# Patient Record
Sex: Female | Born: 1940
Health system: Southern US, Community
[De-identification: ages and names within clinical notes are randomized; demographics above are authoritative.]

## PROBLEM LIST (undated history)

## (undated) DIAGNOSIS — T8149XA Infection following a procedure, other surgical site, initial encounter: Secondary | ICD-10-CM

## (undated) DIAGNOSIS — I10 Essential (primary) hypertension: Secondary | ICD-10-CM

## (undated) DIAGNOSIS — I2510 Atherosclerotic heart disease of native coronary artery without angina pectoris: Secondary | ICD-10-CM

## (undated) DIAGNOSIS — I639 Cerebral infarction, unspecified: Secondary | ICD-10-CM

## (undated) DIAGNOSIS — I2119 ST elevation (STEMI) myocardial infarction involving other coronary artery of inferior wall: Secondary | ICD-10-CM

## (undated) HISTORY — DX: Cerebral infarction, unspecified: I63.9

## (undated) HISTORY — DX: Essential (primary) hypertension: I10

## (undated) HISTORY — PX: BACK SURGERY: SHX140

## (undated) HISTORY — DX: Infection following a procedure, other surgical site, initial encounter: T81.49XA

## (undated) HISTORY — PX: APPENDECTOMY: SHX54

## (undated) HISTORY — PX: ABDOMINAL HYSTERECTOMY: SHX81

## (undated) HISTORY — DX: ST elevation (STEMI) myocardial infarction involving other coronary artery of inferior wall: I21.19

## (undated) HISTORY — DX: Atherosclerotic heart disease of native coronary artery without angina pectoris: I25.10

---

## 1999-05-28 DIAGNOSIS — R609 Edema, unspecified: Secondary | ICD-10-CM | POA: Insufficient documentation

## 1999-06-02 DIAGNOSIS — B029 Zoster without complications: Secondary | ICD-10-CM | POA: Insufficient documentation

## 2001-11-17 DIAGNOSIS — J309 Allergic rhinitis, unspecified: Secondary | ICD-10-CM | POA: Insufficient documentation

## 2003-05-21 DIAGNOSIS — I872 Venous insufficiency (chronic) (peripheral): Secondary | ICD-10-CM | POA: Insufficient documentation

## 2004-09-16 DIAGNOSIS — K5900 Constipation, unspecified: Secondary | ICD-10-CM | POA: Insufficient documentation

## 2006-03-30 DIAGNOSIS — N951 Menopausal and female climacteric states: Secondary | ICD-10-CM | POA: Insufficient documentation

## 2007-03-06 DIAGNOSIS — R7301 Impaired fasting glucose: Secondary | ICD-10-CM | POA: Insufficient documentation

## 2007-11-29 DIAGNOSIS — N259 Disorder resulting from impaired renal tubular function, unspecified: Secondary | ICD-10-CM | POA: Insufficient documentation

## 2007-11-29 DIAGNOSIS — E785 Hyperlipidemia, unspecified: Secondary | ICD-10-CM | POA: Insufficient documentation

## 2008-11-21 DIAGNOSIS — R079 Chest pain, unspecified: Secondary | ICD-10-CM | POA: Insufficient documentation

## 2008-11-21 DIAGNOSIS — I440 Atrioventricular block, first degree: Secondary | ICD-10-CM | POA: Insufficient documentation

## 2008-11-21 DIAGNOSIS — K219 Gastro-esophageal reflux disease without esophagitis: Secondary | ICD-10-CM | POA: Insufficient documentation

## 2011-11-22 ENCOUNTER — Other Ambulatory Visit (HOSPITAL_COMMUNITY): Payer: Self-pay | Admitting: Family Medicine

## 2011-11-22 DIAGNOSIS — Z139 Encounter for screening, unspecified: Secondary | ICD-10-CM

## 2011-12-16 ENCOUNTER — Other Ambulatory Visit (HOSPITAL_COMMUNITY): Payer: Self-pay

## 2011-12-16 ENCOUNTER — Ambulatory Visit (HOSPITAL_COMMUNITY): Payer: Self-pay

## 2011-12-21 ENCOUNTER — Ambulatory Visit (HOSPITAL_COMMUNITY)
Admission: RE | Admit: 2011-12-21 | Discharge: 2011-12-21 | Disposition: A | Discharge status: Home / Self Care | Payer: Medicare PPO | Source: Ambulatory Visit | Attending: Family Medicine | Admitting: Family Medicine

## 2011-12-21 DIAGNOSIS — Z139 Encounter for screening, unspecified: Secondary | ICD-10-CM

## 2011-12-21 DIAGNOSIS — Z1231 Encounter for screening mammogram for malignant neoplasm of breast: Secondary | ICD-10-CM | POA: Insufficient documentation

## 2011-12-21 DIAGNOSIS — M899 Disorder of bone, unspecified: Secondary | ICD-10-CM | POA: Insufficient documentation

## 2012-05-22 ENCOUNTER — Other Ambulatory Visit (HOSPITAL_COMMUNITY): Payer: Self-pay | Admitting: Family Medicine

## 2012-05-22 DIAGNOSIS — R1310 Dysphagia, unspecified: Secondary | ICD-10-CM

## 2012-05-23 ENCOUNTER — Other Ambulatory Visit (HOSPITAL_COMMUNITY): Payer: Medicare PPO

## 2013-02-07 ENCOUNTER — Encounter (INDEPENDENT_AMBULATORY_CARE_PROVIDER_SITE_OTHER): Payer: Self-pay | Admitting: *Deleted

## 2013-02-14 ENCOUNTER — Other Ambulatory Visit (INDEPENDENT_AMBULATORY_CARE_PROVIDER_SITE_OTHER): Payer: Self-pay | Admitting: *Deleted

## 2013-02-14 ENCOUNTER — Telehealth (INDEPENDENT_AMBULATORY_CARE_PROVIDER_SITE_OTHER): Payer: Self-pay | Admitting: *Deleted

## 2013-02-14 ENCOUNTER — Encounter (INDEPENDENT_AMBULATORY_CARE_PROVIDER_SITE_OTHER): Payer: Self-pay | Admitting: *Deleted

## 2013-02-14 DIAGNOSIS — Z1211 Encounter for screening for malignant neoplasm of colon: Secondary | ICD-10-CM

## 2013-02-14 MED ORDER — PEG-KCL-NACL-NASULF-NA ASC-C 100 G PO SOLR: 1.0000 | Freq: Once | ORAL | Status: DC

## 2013-02-14 NOTE — Telephone Encounter (Signed)
Patient needs movi prep 

## 2013-02-16 ENCOUNTER — Other Ambulatory Visit (HOSPITAL_COMMUNITY): Payer: Self-pay | Admitting: Family Medicine

## 2013-02-16 DIAGNOSIS — Z139 Encounter for screening, unspecified: Secondary | ICD-10-CM

## 2013-02-19 ENCOUNTER — Ambulatory Visit (HOSPITAL_COMMUNITY)
Admission: RE | Admit: 2013-02-19 | Discharge: 2013-02-19 | Disposition: A | Discharge status: Home / Self Care | Payer: Medicare PPO | Source: Ambulatory Visit | Attending: Family Medicine | Admitting: Family Medicine

## 2013-02-19 DIAGNOSIS — Z139 Encounter for screening, unspecified: Secondary | ICD-10-CM

## 2013-02-19 DIAGNOSIS — Z1231 Encounter for screening mammogram for malignant neoplasm of breast: Secondary | ICD-10-CM | POA: Insufficient documentation

## 2013-03-08 ENCOUNTER — Telehealth (INDEPENDENT_AMBULATORY_CARE_PROVIDER_SITE_OTHER): Payer: Self-pay | Admitting: *Deleted

## 2013-03-08 NOTE — Telephone Encounter (Signed)
  Procedure: tcs  Reason/Indication:  screening  Has patient had this procedure before?  Yes, more than 10 yrs ago  If so, when, by whom and where?    Is there a family history of colon cancer?  no  Who?  What age when diagnosed?    Is patient diabetic?   no      Does patient have prosthetic heart valve?  no  Do you have a pacemaker?  no  Has patient ever had endocarditis? no  Has patient had joint replacement within last 12 months?  no  Is patient on Coumadin, Plavix and/or Aspirin? no  Medications: triamterene 75 mg 1/2 tab daily, amlodipine 10 mg daily, alendronate 70 mg weekly,   Allergies: nkda  Medication Adjustment:   Procedure date & time: 03/29/13 at 930

## 2013-03-08 NOTE — Telephone Encounter (Signed)
agree

## 2013-03-20 ENCOUNTER — Encounter (INDEPENDENT_AMBULATORY_CARE_PROVIDER_SITE_OTHER): Payer: Self-pay | Admitting: *Deleted

## 2013-03-29 ENCOUNTER — Ambulatory Visit (HOSPITAL_COMMUNITY)
Admission: RE | Admit: 2013-03-29 | Discharge: 2013-03-29 | Disposition: A | Discharge status: Home / Self Care | Payer: BC Managed Care – PPO | Source: Ambulatory Visit | Attending: Internal Medicine | Admitting: Internal Medicine

## 2013-03-29 ENCOUNTER — Encounter (HOSPITAL_COMMUNITY)
Admission: RE | Disposition: A | Discharge status: Home / Self Care | Payer: Self-pay | Source: Ambulatory Visit | Attending: Internal Medicine

## 2013-03-29 ENCOUNTER — Encounter (HOSPITAL_COMMUNITY): Payer: Self-pay | Admitting: *Deleted

## 2013-03-29 DIAGNOSIS — D126 Benign neoplasm of colon, unspecified: Secondary | ICD-10-CM

## 2013-03-29 DIAGNOSIS — Z1211 Encounter for screening for malignant neoplasm of colon: Secondary | ICD-10-CM | POA: Insufficient documentation

## 2013-03-29 DIAGNOSIS — K644 Residual hemorrhoidal skin tags: Secondary | ICD-10-CM

## 2013-03-29 DIAGNOSIS — I10 Essential (primary) hypertension: Secondary | ICD-10-CM | POA: Diagnosis not present

## 2013-03-29 DIAGNOSIS — K5730 Diverticulosis of large intestine without perforation or abscess without bleeding: Secondary | ICD-10-CM | POA: Insufficient documentation

## 2013-03-29 HISTORY — PX: COLONOSCOPY: SHX5424

## 2013-03-29 SURGERY — COLONOSCOPY
Anesthesia: Moderate Sedation

## 2013-03-29 MED ORDER — MEPERIDINE HCL 50 MG/ML IJ SOLN
INTRAMUSCULAR | Status: DC | PRN
  Administered 2013-03-29 (×2): 25 mg via INTRAVENOUS

## 2013-03-29 MED ORDER — MEPERIDINE HCL 50 MG/ML IJ SOLN
INTRAMUSCULAR | Status: AC
  Filled 2013-03-29: qty 1

## 2013-03-29 MED ORDER — MIDAZOLAM HCL 5 MG/5ML IJ SOLN
INTRAMUSCULAR | Status: AC
  Filled 2013-03-29: qty 10

## 2013-03-29 MED ORDER — SODIUM CHLORIDE 0.9 % IV SOLN
INTRAVENOUS | Status: DC
  Administered 2013-03-29: 09:00:00 via INTRAVENOUS

## 2013-03-29 MED ORDER — MIDAZOLAM HCL 5 MG/5ML IJ SOLN
INTRAMUSCULAR | Status: DC | PRN
  Administered 2013-03-29: 2 mg via INTRAVENOUS
  Administered 2013-03-29 (×2): 1 mg via INTRAVENOUS

## 2013-03-29 MED ORDER — STERILE WATER FOR IRRIGATION IR SOLN
Status: DC | PRN
  Administered 2013-03-29: 09:00:00

## 2013-03-29 NOTE — H&P (Signed)
[name] [name] is an 72 y.o. female.   Chief Complaint: Patient is here for colonoscopy. HPI: Patient is 72 year old African female who is in for screening colonoscopy. Her last exam was over 10 years ago. She denies abdominal pain change in bowel habits rectal bleeding. She has occasional hematochezia when she is constipated. Family history is negative for CRC.  Past Medical History  Diagnosis Date  . Hypertension     Past Surgical History  Procedure Laterality Date  . Back surgery    . Abdominal hysterectomy    . Appendectomy      Family History  Problem Relation Age of Onset  . Colon cancer Neg Hx    Social History:  reports that she has never smoked. She does not have any smokeless tobacco history on file. She reports that she does not drink alcohol or use illicit drugs.  Allergies:  Allergies  Allergen Reactions  . Other Hives and Itching    Food Dye    Medications Prior to Admission  Medication Sig Dispense Refill  . alendronate (FOSAMAX) 70 MG tablet Take 70 mg by mouth every 7 (seven) days. Take with a full glass of water on an empty stomach.      . Calcium-Magnesium-Vitamin D (CALCIUM 500 PO) Take 1 tablet by mouth daily.      . peg 3350 powder (MOVIPREP) 100 G SOLR Take 1 kit (100 g total) by mouth once.  1 kit  0  . triamterene-hydrochlorothiazide (MAXZIDE-25) 37.5-25 MG per tablet Take 0.5 tablets by mouth daily.        No results found for this or any previous visit (from the past 48 hour(s)). No results found.  ROS  Blood pressure 127/80, pulse 74, temperature 97.7 F (36.5 C), temperature source Oral, resp. rate 15, height 5' 10.25" (1.784 m), weight 172 lb (78.019 kg), SpO2 100.00%. Physical Exam  Constitutional: She appears well-developed and well-nourished.  HENT:  Mouth/Throat: Oropharynx is clear and moist.  Eyes: Conjunctivae are normal. No scleral icterus.  Neck: No thyromegaly present.  Cardiovascular: Normal rate, regular rhythm and normal  heart sounds.   No murmur heard. Respiratory: Effort normal and breath sounds normal.  GI: Soft. She exhibits no distension and no mass. There is no tenderness.  Musculoskeletal: She exhibits no edema.  Lymphadenopathy:    She has no cervical adenopathy.  Neurological: She is alert.  Skin: Skin is warm and dry.     Assessment/Plan Average risk screening colonoscopy.  REHMAN,NAJEEB U 03/29/2013, 9:16 AM

## 2013-03-29 NOTE — Op Note (Addendum)
COLONOSCOPY PROCEDURE REPORT  PATIENT:  [name] [name]  MR#:  [id number] Birthdate:  [birth year], 72 y.o., female Endoscopist:  Dr. Malissa Hippo, MD Referred By:  Dr. Ishmael Holter. Renard Matter, MD Procedure Date: 03/29/2013  Procedure:   Colonoscopy  Indications: Patient is 72 year old African female who is undergoing average risk screening colonoscopy. Her last exam was over 10 years ago.  Informed Consent:  The procedure and risks were reviewed with the patient and informed consent was obtained.  Medications:  Demerol 50 mg IV Versed 4 mg IV  Description of procedure:  After a digital rectal exam was performed, that colonoscope was advanced from the anus through the rectum and colon to the area of the cecum, ileocecal valve and appendiceal orifice. The cecum was deeply intubated. These structures were well-seen and photographed for the record. From the level of the cecum and ileocecal valve, the scope was slowly and cautiously withdrawn. The mucosal surfaces were carefully surveyed utilizing scope tip to flexion to facilitate fold flattening as needed. The scope was pulled down into the rectum where a thorough exam including retroflexion was performed.  Findings:   Prep satisfactory. Small polyp ablated via cold biopsy from ascending colon. Polyp was located just above the ileocecal valve. Few small diverticula and sigmoid colon. Normal rectal mucosa. Small hemorrhoids below the dentate line.   Therapeutic/Diagnostic Maneuvers Performed:  See above  Complications:  None  Cecal Withdrawal Time:  15  minutes  Impression:  Examination performed to cecum. Small polyp ablated via cold biopsy from ascending colon. Few small diverticula and sigmoid colon. Small external hemorrhoids.  Recommendations:  Standard instructions given. I will contact patient with biopsy results and further recommendations.  [name]  03/29/2013 9:51 AM  CC: Dr. Alice Reichert, MD & Dr. Bonnetta Barry ref. provider  found

## 2013-04-03 ENCOUNTER — Encounter (HOSPITAL_COMMUNITY): Payer: Self-pay | Admitting: Internal Medicine

## 2013-04-24 ENCOUNTER — Encounter (INDEPENDENT_AMBULATORY_CARE_PROVIDER_SITE_OTHER): Payer: Self-pay | Admitting: *Deleted

## 2013-06-04 ENCOUNTER — Ambulatory Visit (HOSPITAL_COMMUNITY)
Admission: RE | Admit: 2013-06-04 | Discharge: 2013-06-04 | Disposition: A | Discharge status: Home / Self Care | Payer: BC Managed Care – PPO | Source: Ambulatory Visit | Attending: Family Medicine | Admitting: Family Medicine

## 2013-06-04 ENCOUNTER — Other Ambulatory Visit (HOSPITAL_COMMUNITY): Payer: Self-pay | Admitting: Family Medicine

## 2013-06-04 DIAGNOSIS — R52 Pain, unspecified: Secondary | ICD-10-CM

## 2013-06-04 DIAGNOSIS — X58XXXA Exposure to other specified factors, initial encounter: Secondary | ICD-10-CM | POA: Insufficient documentation

## 2013-06-04 DIAGNOSIS — S8990XA Unspecified injury of unspecified lower leg, initial encounter: Secondary | ICD-10-CM | POA: Insufficient documentation

## 2014-01-08 DIAGNOSIS — M25562 Pain in left knee: Secondary | ICD-10-CM | POA: Insufficient documentation

## 2014-10-01 ENCOUNTER — Other Ambulatory Visit (HOSPITAL_COMMUNITY): Payer: Self-pay | Admitting: Family Medicine

## 2014-10-01 ENCOUNTER — Ambulatory Visit (HOSPITAL_COMMUNITY)
Admission: RE | Admit: 2014-10-01 | Discharge: 2014-10-01 | Disposition: A | Discharge status: Home / Self Care | Payer: BC Managed Care – PPO | Source: Ambulatory Visit | Attending: Family Medicine | Admitting: Family Medicine

## 2014-10-01 DIAGNOSIS — M5030 Other cervical disc degeneration, unspecified cervical region: Secondary | ICD-10-CM

## 2014-10-01 DIAGNOSIS — M25551 Pain in right hip: Secondary | ICD-10-CM | POA: Insufficient documentation

## 2014-10-01 DIAGNOSIS — M25552 Pain in left hip: Secondary | ICD-10-CM | POA: Insufficient documentation

## 2014-10-01 DIAGNOSIS — M545 Low back pain: Secondary | ICD-10-CM | POA: Diagnosis present

## 2014-10-01 DIAGNOSIS — M5136 Other intervertebral disc degeneration, lumbar region: Secondary | ICD-10-CM | POA: Insufficient documentation

## 2014-10-22 ENCOUNTER — Other Ambulatory Visit (HOSPITAL_COMMUNITY): Payer: Self-pay | Admitting: Family Medicine

## 2014-10-22 DIAGNOSIS — Z1231 Encounter for screening mammogram for malignant neoplasm of breast: Secondary | ICD-10-CM

## 2014-10-28 ENCOUNTER — Ambulatory Visit (HOSPITAL_COMMUNITY)
Admission: RE | Admit: 2014-10-28 | Discharge: 2014-10-28 | Disposition: A | Discharge status: Home / Self Care | Payer: BC Managed Care – PPO | Source: Ambulatory Visit | Attending: Family Medicine | Admitting: Family Medicine

## 2014-10-28 DIAGNOSIS — Z1231 Encounter for screening mammogram for malignant neoplasm of breast: Secondary | ICD-10-CM | POA: Diagnosis not present

## 2015-02-14 ENCOUNTER — Encounter (INDEPENDENT_AMBULATORY_CARE_PROVIDER_SITE_OTHER): Payer: Self-pay | Admitting: *Deleted

## 2015-02-19 ENCOUNTER — Encounter (INDEPENDENT_AMBULATORY_CARE_PROVIDER_SITE_OTHER): Payer: Self-pay | Admitting: *Deleted

## 2015-02-19 ENCOUNTER — Encounter (INDEPENDENT_AMBULATORY_CARE_PROVIDER_SITE_OTHER): Payer: Self-pay | Admitting: Internal Medicine

## 2015-02-19 ENCOUNTER — Ambulatory Visit (INDEPENDENT_AMBULATORY_CARE_PROVIDER_SITE_OTHER): Payer: Medicare PPO | Admitting: Internal Medicine

## 2015-02-19 VITALS — BP 188/90 | HR 76 | Temp 98.0°F | Ht 70.0 in | Wt 173.5 lb

## 2015-02-19 DIAGNOSIS — K5909 Other constipation: Secondary | ICD-10-CM | POA: Diagnosis not present

## 2015-02-19 DIAGNOSIS — R1314 Dysphagia, pharyngoesophageal phase: Secondary | ICD-10-CM

## 2015-02-19 DIAGNOSIS — I10 Essential (primary) hypertension: Secondary | ICD-10-CM | POA: Insufficient documentation

## 2015-02-19 DIAGNOSIS — R143 Flatulence: Secondary | ICD-10-CM | POA: Diagnosis not present

## 2015-02-19 NOTE — Progress Notes (Signed)
   Subjective:    Patient ID: [name] [name], female    DOB: 1940-11-12, 73 y.o.   MRN: [id number]  HPI Presents today with c/o of excessive gas. She says she has gas all the time. She carries Gas X with her.    She says sometimes when she swallows she feels a lump. There is no dysphagia. She has trouble with swallowing liquids at times.  She is having a BM usually once a week. Sometimes she will take Phillip's MOM. Ducolax as needed. She has tried Linzess. She says the Linzess helped but then stopped working.  She has ball like stool. Sometimes stools are loose. She denies any melena or BRRB. Constipation has been going on for a couple of years. Appetite is good. Eight pound weight loss intentional. . No abdominal pain  Occasionally has heart burn. Last colonoscopy was in 2014  (see below)  03/29/2013 Colonoscopy: Average risk: Dr. Laural Golden:   Findings:  Prep satisfactory. Small polyp ablated via cold biopsy from ascending colon. Polyp was located just above the ileocecal valve. Few small diverticula and sigmoid colon. Normal rectal mucosa. Small hemorrhoids below the dentate line. Biopsy: sessile serrated polyp, adenoma. Next colonoscopy in 26 ys. Review of Systems Divorced, One child with high blood pressure. Retired Hospital doctor.  Past Medical History  Diagnosis Date  . Hypertension     Past Surgical History  Procedure Laterality Date  . Back surgery    . Abdominal hysterectomy    . Appendectomy    . Colonoscopy N/A 03/29/2013    Procedure: COLONOSCOPY;  Surgeon: Rogene Houston, MD;  Location: [facility];  Service: Endoscopy;  Laterality: N/A;  930    Allergies  Allergen Reactions  . Other Hives and Itching    Food Dye    No current outpatient prescriptions on file prior to visit.   No current facility-administered medications on file prior to visit.      Objective:   Physical Exam Blood pressure 188/90, pulse 76, temperature 98 F (36.7 C), height 5\' 10"   (1.778 m), weight 173 lb 8 oz (78.699 kg).  Alert and oriented. Skin warm and dry. Oral mucosa is moist.   . Sclera anicteric, conjunctivae is pink. Thyroid not enlarged. No cervical lymphadenopathy. Lungs clear. Heart regular rate and rhythm.  Abdomen is soft. Bowel sounds are positive. No hepatomegaly. No abdominal masses felt. No tenderness.  No edema to lower extremities.       Assessment & Plan:  Flatus, constipation. Will try her on Amitza and see if this helps.  Dysphagia to liquids: Am going to get an Esophagram to rule out a motility problem.  OV in 3 months

## 2015-02-19 NOTE — Patient Instructions (Signed)
Esophagram  Samples of Amitiza given to patient

## 2015-02-26 ENCOUNTER — Ambulatory Visit (HOSPITAL_COMMUNITY)
Admission: RE | Admit: 2015-02-26 | Discharge: 2015-02-26 | Disposition: A | Discharge status: Home / Self Care | Payer: Medicare PPO | Source: Ambulatory Visit | Attending: Internal Medicine | Admitting: Internal Medicine

## 2015-02-26 DIAGNOSIS — R1314 Dysphagia, pharyngoesophageal phase: Secondary | ICD-10-CM | POA: Insufficient documentation

## 2015-02-26 DIAGNOSIS — I10 Essential (primary) hypertension: Secondary | ICD-10-CM | POA: Diagnosis not present

## 2015-02-26 DIAGNOSIS — K449 Diaphragmatic hernia without obstruction or gangrene: Secondary | ICD-10-CM | POA: Insufficient documentation

## 2015-02-28 ENCOUNTER — Telehealth (INDEPENDENT_AMBULATORY_CARE_PROVIDER_SITE_OTHER): Payer: Self-pay | Admitting: Internal Medicine

## 2015-02-28 DIAGNOSIS — K5909 Other constipation: Secondary | ICD-10-CM

## 2015-02-28 MED ORDER — LUBIPROSTONE 24 MCG PO CAPS: 24.0000 ug | ORAL_CAPSULE | Freq: Every day | ORAL | Status: DC

## 2015-02-28 NOTE — Telephone Encounter (Signed)
Rx for Amitiza 38mcg sent to her pharmacy.

## 2015-04-14 ENCOUNTER — Encounter (INDEPENDENT_AMBULATORY_CARE_PROVIDER_SITE_OTHER): Payer: Self-pay | Admitting: *Deleted

## 2015-05-26 ENCOUNTER — Ambulatory Visit (INDEPENDENT_AMBULATORY_CARE_PROVIDER_SITE_OTHER): Payer: Medicare PPO | Admitting: Internal Medicine

## 2015-05-26 ENCOUNTER — Encounter (INDEPENDENT_AMBULATORY_CARE_PROVIDER_SITE_OTHER): Payer: Self-pay | Admitting: Internal Medicine

## 2015-05-26 VITALS — BP 128/90 | HR 68 | Temp 98.0°F | Ht 70.0 in | Wt 175.3 lb

## 2015-05-26 DIAGNOSIS — K5909 Other constipation: Secondary | ICD-10-CM

## 2015-05-26 MED ORDER — LUBIPROSTONE 24 MCG PO CAPS: 24.0000 ug | ORAL_CAPSULE | Freq: Two times a day (BID) | ORAL | Status: DC

## 2015-05-26 NOTE — Progress Notes (Signed)
   Subjective:    Patient ID: [name] [name], female    DOB: 07-07-1941, 74 y.o.   MRN: [id number]  HPIHere today for f/u. She was last seen in July for excessive flatus. She had flatus "all the time". She had being using Gas X.  She tells me she is having a BM twice a week.  The Amitiza is really not helping. She takes the Amitiza daily. Her stools are hard. No melena or BRRB. She also c/o gas. Symptoms for years.  Appetite is good. She has gained 2 pounds since her last visit. She has tried MOM and Linzess once a day but really did not help. Linzess gave her diarrhea and she stopped. Last colonoscopy was in 2014 (see below)  03/29/2013 Colonoscopy: Average risk: Dr. Laural Golden:   Findings:  Prep satisfactory. Small polyp ablated via cold biopsy from ascending colon. Polyp was located just above the ileocecal valve. Few small diverticula and sigmoid colon. Normal rectal mucosa. Small hemorrhoids below the dentate line. Biopsy: sessile serrated polyp, adenoma. Next colonoscopy in 20 ys. Review of Systems Divorced, One child with high blood pressure. Retired Hospital doctor.   Review of Systems Past Medical History  Diagnosis Date  . Hypertension     Past Surgical History  Procedure Laterality Date  . Back surgery    . Abdominal hysterectomy    . Appendectomy    . Colonoscopy N/A 03/29/2013    Procedure: COLONOSCOPY;  Surgeon: Rogene Houston, MD;  Location: [facility];  Service: Endoscopy;  Laterality: N/A;  930    Allergies  Allergen Reactions  . Other Hives and Itching    Food Dye    Current Outpatient Prescriptions on File Prior to Visit  Medication Sig Dispense Refill  . lubiprostone (AMITIZA) 24 MCG capsule Take 1 capsule (24 mcg total) by mouth daily. 30 capsule 3   No current facility-administered medications on file prior to visit.        Objective:   Physical Exam Blood pressure 128/90, pulse 68, temperature 98 F (36.7 C), height 5\' 10"  (1.778 m),  weight 175 lb 4.8 oz (79.516 kg).  Alert and oriented. Skin warm and dry. Oral mucosa is moist.   . Sclera anicteric, conjunctivae is pink. Thyroid not enlarged. No cervical lymphadenopathy. Lungs clear. Heart regular rate and rhythm.  Abdomen is soft. Bowel sounds are positive. No hepatomegaly. No abdominal masses felt. No tenderness.  No edema to lower extremities.         Assessment & Plan:  Constipation: Am going to increase Amitiza 79mcg to BID. Increase fiber  OV in 6 months.

## 2015-05-26 NOTE — Patient Instructions (Signed)
Amitiza 56mcg BID. OV in 6 months  Increase fiber in diet.

## 2015-08-10 ENCOUNTER — Emergency Department (HOSPITAL_COMMUNITY): Payer: Medicare PPO

## 2015-08-10 ENCOUNTER — Encounter (HOSPITAL_COMMUNITY): Payer: Self-pay | Admitting: *Deleted

## 2015-08-10 ENCOUNTER — Inpatient Hospital Stay (HOSPITAL_COMMUNITY)
Admission: EM | Admit: 2015-08-10 | Discharge: 2015-08-22 | DRG: 232 | Disposition: A | Discharge status: Home Health | Payer: Medicare PPO | Attending: Cardiothoracic Surgery | Admitting: Cardiothoracic Surgery

## 2015-08-10 ENCOUNTER — Encounter (HOSPITAL_COMMUNITY)
Admission: EM | Disposition: A | Discharge status: Home Health | Payer: Medicare PPO | Source: Home / Self Care | Attending: Cardiothoracic Surgery

## 2015-08-10 DIAGNOSIS — I2111 ST elevation (STEMI) myocardial infarction involving right coronary artery: Secondary | ICD-10-CM | POA: Diagnosis not present

## 2015-08-10 DIAGNOSIS — K219 Gastro-esophageal reflux disease without esophagitis: Secondary | ICD-10-CM | POA: Diagnosis present

## 2015-08-10 DIAGNOSIS — I10 Essential (primary) hypertension: Secondary | ICD-10-CM | POA: Diagnosis present

## 2015-08-10 DIAGNOSIS — I25110 Atherosclerotic heart disease of native coronary artery with unstable angina pectoris: Secondary | ICD-10-CM | POA: Diagnosis not present

## 2015-08-10 DIAGNOSIS — J9811 Atelectasis: Secondary | ICD-10-CM

## 2015-08-10 DIAGNOSIS — K5900 Constipation, unspecified: Secondary | ICD-10-CM | POA: Diagnosis present

## 2015-08-10 DIAGNOSIS — D696 Thrombocytopenia, unspecified: Secondary | ICD-10-CM | POA: Diagnosis not present

## 2015-08-10 DIAGNOSIS — R350 Frequency of micturition: Secondary | ICD-10-CM | POA: Diagnosis present

## 2015-08-10 DIAGNOSIS — Z79899 Other long term (current) drug therapy: Secondary | ICD-10-CM

## 2015-08-10 DIAGNOSIS — R079 Chest pain, unspecified: Secondary | ICD-10-CM | POA: Diagnosis present

## 2015-08-10 DIAGNOSIS — E876 Hypokalemia: Secondary | ICD-10-CM | POA: Diagnosis not present

## 2015-08-10 DIAGNOSIS — E871 Hypo-osmolality and hyponatremia: Secondary | ICD-10-CM | POA: Diagnosis not present

## 2015-08-10 DIAGNOSIS — D62 Acute posthemorrhagic anemia: Secondary | ICD-10-CM | POA: Diagnosis not present

## 2015-08-10 DIAGNOSIS — I2510 Atherosclerotic heart disease of native coronary artery without angina pectoris: Secondary | ICD-10-CM | POA: Diagnosis not present

## 2015-08-10 DIAGNOSIS — I2119 ST elevation (STEMI) myocardial infarction involving other coronary artery of inferior wall: Secondary | ICD-10-CM | POA: Diagnosis present

## 2015-08-10 DIAGNOSIS — Z951 Presence of aortocoronary bypass graft: Secondary | ICD-10-CM

## 2015-08-10 DIAGNOSIS — I252 Old myocardial infarction: Secondary | ICD-10-CM | POA: Diagnosis not present

## 2015-08-10 DIAGNOSIS — I200 Unstable angina: Secondary | ICD-10-CM

## 2015-08-10 DIAGNOSIS — Z419 Encounter for procedure for purposes other than remedying health state, unspecified: Secondary | ICD-10-CM

## 2015-08-10 DIAGNOSIS — E8770 Fluid overload, unspecified: Secondary | ICD-10-CM | POA: Diagnosis not present

## 2015-08-10 HISTORY — PX: CARDIAC CATHETERIZATION: SHX172

## 2015-08-10 LAB — URINALYSIS, ROUTINE W REFLEX MICROSCOPIC
Bilirubin Urine: NEGATIVE
Glucose, UA: NEGATIVE mg/dL
Ketones, ur: 15 mg/dL — AB
Leukocytes, UA: NEGATIVE
Nitrite: NEGATIVE
Protein, ur: NEGATIVE mg/dL
Specific Gravity, Urine: >1.046 — ABNORMAL HIGH (ref 1.005–1.030)
pH: 6.5 (ref 5.0–8.0)

## 2015-08-10 LAB — URINE MICROSCOPIC-ADD ON

## 2015-08-10 LAB — PROTIME-INR
INR: 1.21 (ref 0.00–1.49)
PROTHROMBIN TIME: 15.4 s — AB (ref 11.6–15.2)

## 2015-08-10 LAB — CBC
HEMATOCRIT: 43.8 % (ref 36.0–46.0)
HEMATOCRIT: 40.2 % (ref 36.0–46.0)
HEMOGLOBIN: 14.4 g/dL (ref 12.0–15.0)
HEMOGLOBIN: 13 g/dL (ref 12.0–15.0)
MCH: 28.6 pg (ref 26.0–34.0)
MCH: 28.3 pg (ref 26.0–34.0)
MCHC: 32.9 g/dL (ref 30.0–36.0)
MCHC: 32.3 g/dL (ref 30.0–36.0)
MCV: 86.9 fL (ref 78.0–100.0)
MCV: 87.4 fL (ref 78.0–100.0)
Platelets: 182 10*3/uL (ref 150–400)
Platelets: 178 10*3/uL (ref 150–400)
RBC: 5.04 MIL/uL (ref 3.87–5.11)
RBC: 4.6 MIL/uL (ref 3.87–5.11)
RDW: 13.6 % (ref 11.5–15.5)
RDW: 13.6 % (ref 11.5–15.5)
WBC: 5.5 10*3/uL (ref 4.0–10.5)
WBC: 5.6 10*3/uL (ref 4.0–10.5)

## 2015-08-10 LAB — I-STAT TROPONIN, ED: Troponin i, poc: 5.94 ng/mL (ref 0.00–0.08)

## 2015-08-10 LAB — BASIC METABOLIC PANEL
ANION GAP: 15 (ref 5–15)
BUN: 14 mg/dL (ref 6–20)
CHLORIDE: 103 mmol/L (ref 101–111)
CO2: 22 mmol/L (ref 22–32)
Calcium: 10.1 mg/dL (ref 8.9–10.3)
Creatinine, Ser: 0.84 mg/dL (ref 0.44–1.00)
GFR calc non Af Amer: >60 mL/min (ref >60)
Glucose, Bld: 105 mg/dL — ABNORMAL HIGH (ref 65–99)
POTASSIUM: 3.6 mmol/L (ref 3.5–5.1)
SODIUM: 140 mmol/L (ref 135–145)

## 2015-08-10 LAB — MAGNESIUM: MAGNESIUM: 2.1 mg/dL (ref 1.7–2.4)

## 2015-08-10 LAB — PLATELET COUNT: PLATELETS: 167 10*3/uL (ref 150–400)

## 2015-08-10 LAB — SURGICAL PCR SCREEN
MRSA, PCR: NEGATIVE
Staphylococcus aureus: NEGATIVE

## 2015-08-10 LAB — CBG MONITORING, ED: Glucose-Capillary: 101 mg/dL — ABNORMAL HIGH (ref 65–99)

## 2015-08-10 SURGERY — LEFT HEART CATH AND CORONARY ANGIOGRAPHY
Anesthesia: LOCAL

## 2015-08-10 MED ORDER — AMLODIPINE BESYLATE 10 MG PO TABS
10.0000 mg | ORAL_TABLET | Freq: Every day | ORAL | Status: DC
Start: 2015-08-10 — End: 2015-08-14
  Administered 2015-08-10 – 2015-08-13 (×4): 10 mg via ORAL
  Filled 2015-08-10 (×4): qty 1

## 2015-08-10 MED ORDER — VERAPAMIL HCL 2.5 MG/ML IV SOLN
INTRAVENOUS | Status: AC
  Filled 2015-08-10: qty 2

## 2015-08-10 MED ORDER — ASPIRIN 81 MG PO CHEW: 324.0000 mg | CHEWABLE_TABLET | Freq: Once | ORAL | Status: AC

## 2015-08-10 MED ORDER — ONDANSETRON HCL 4 MG/2ML IJ SOLN: 4.0000 mg | Freq: Four times a day (QID) | INTRAMUSCULAR | Status: DC | PRN

## 2015-08-10 MED ORDER — TIROFIBAN (AGGRASTAT) BOLUS VIA INFUSION
INTRAVENOUS | Status: DC | PRN
  Administered 2015-08-10: 2000 ug via INTRAVENOUS

## 2015-08-10 MED ORDER — LIDOCAINE HCL (PF) 1 % IJ SOLN
INTRAMUSCULAR | Status: AC
  Filled 2015-08-10: qty 30

## 2015-08-10 MED ORDER — SODIUM CHLORIDE 0.9 % WEIGHT BASED INFUSION: 3.0000 mL/kg/h | INTRAVENOUS | Status: AC

## 2015-08-10 MED ORDER — FENTANYL CITRATE (PF) 100 MCG/2ML IJ SOLN
INTRAMUSCULAR | Status: DC | PRN
  Administered 2015-08-10 (×2): 25 ug via INTRAVENOUS

## 2015-08-10 MED ORDER — HEPARIN (PORCINE) IN NACL 2-0.9 UNIT/ML-% IJ SOLN
INTRAMUSCULAR | Status: AC
  Filled 2015-08-10: qty 1500

## 2015-08-10 MED ORDER — ASPIRIN 81 MG PO CHEW
81.0000 mg | CHEWABLE_TABLET | Freq: Every day | ORAL | Status: DC
  Administered 2015-08-11 – 2015-08-13 (×3): 81 mg via ORAL
  Filled 2015-08-10 (×3): qty 1

## 2015-08-10 MED ORDER — MIDAZOLAM HCL 2 MG/2ML IJ SOLN
INTRAMUSCULAR | Status: AC
  Filled 2015-08-10: qty 2

## 2015-08-10 MED ORDER — IOHEXOL 350 MG/ML SOLN
INTRAVENOUS | Status: DC | PRN
  Administered 2015-08-10: 125 mL via INTRA_ARTERIAL

## 2015-08-10 MED ORDER — TIROFIBAN HCL IN NACL 5-0.9 MG/100ML-% IV SOLN
INTRAVENOUS | Status: AC
  Filled 2015-08-10: qty 100

## 2015-08-10 MED ORDER — ATORVASTATIN CALCIUM 80 MG PO TABS
80.0000 mg | ORAL_TABLET | Freq: Every day | ORAL | Status: DC
  Administered 2015-08-11 – 2015-08-21 (×9): 80 mg via ORAL
  Filled 2015-08-10 (×10): qty 1

## 2015-08-10 MED ORDER — ASPIRIN 81 MG PO CHEW: 324.0000 mg | CHEWABLE_TABLET | Freq: Once | ORAL | Status: DC

## 2015-08-10 MED ORDER — FENTANYL CITRATE (PF) 100 MCG/2ML IJ SOLN
INTRAMUSCULAR | Status: AC
  Filled 2015-08-10: qty 2

## 2015-08-10 MED ORDER — LIDOCAINE HCL (PF) 1 % IJ SOLN
INTRAMUSCULAR | Status: DC | PRN
  Administered 2015-08-10: 18:00:00

## 2015-08-10 MED ORDER — SODIUM CHLORIDE 0.9 % IJ SOLN: 3.0000 mL | INTRAMUSCULAR | Status: DC | PRN

## 2015-08-10 MED ORDER — HEPARIN SODIUM (PORCINE) 1000 UNIT/ML IJ SOLN
INTRAMUSCULAR | Status: AC
  Filled 2015-08-10: qty 1

## 2015-08-10 MED ORDER — MIDAZOLAM HCL 2 MG/2ML IJ SOLN
INTRAMUSCULAR | Status: DC | PRN
  Administered 2015-08-10: 1 mg via INTRAVENOUS
  Administered 2015-08-10: 2 mg via INTRAVENOUS

## 2015-08-10 MED ORDER — TIROFIBAN HCL IN NACL 5-0.9 MG/100ML-% IV SOLN
0.1500 ug/kg/min | INTRAVENOUS | Status: DC
  Filled 2015-08-10: qty 100

## 2015-08-10 MED ORDER — ACETAMINOPHEN 325 MG PO TABS: 650.0000 mg | ORAL_TABLET | ORAL | Status: DC | PRN

## 2015-08-10 MED ORDER — METOPROLOL TARTRATE 12.5 MG HALF TABLET
12.5000 mg | ORAL_TABLET | Freq: Two times a day (BID) | ORAL | Status: DC
  Administered 2015-08-10 – 2015-08-13 (×6): 12.5 mg via ORAL
  Filled 2015-08-10 (×7): qty 1

## 2015-08-10 MED ORDER — SODIUM CHLORIDE 0.9 % IV SOLN
250.0000 mL | INTRAVENOUS | Status: DC | PRN
Start: 2015-08-10 — End: 2015-08-14
  Administered 2015-08-12: 250 mL via INTRAVENOUS
  Administered 2015-08-13: 500 mL via INTRAVENOUS

## 2015-08-10 MED ORDER — HEPARIN BOLUS VIA INFUSION
4000.0000 [IU] | Freq: Once | INTRAVENOUS | Status: AC
  Administered 2015-08-10: 4000 [IU] via INTRAVENOUS
  Filled 2015-08-10: qty 4000

## 2015-08-10 MED ORDER — NITROGLYCERIN 1 MG/10 ML FOR IR/CATH LAB
INTRA_ARTERIAL | Status: AC
  Filled 2015-08-10: qty 10

## 2015-08-10 MED ORDER — HEPARIN SODIUM (PORCINE) 1000 UNIT/ML IJ SOLN
INTRAMUSCULAR | Status: DC | PRN
  Administered 2015-08-10: 3000 [IU] via INTRAVENOUS
  Administered 2015-08-10: 4000 [IU] via INTRAVENOUS

## 2015-08-10 MED ORDER — ASPIRIN 81 MG PO CHEW
324.0000 mg | CHEWABLE_TABLET | Freq: Once | ORAL | Status: AC
  Administered 2015-08-10: 243 mg via ORAL
  Filled 2015-08-10: qty 4

## 2015-08-10 MED ORDER — NITROGLYCERIN 1 MG/10 ML FOR IR/CATH LAB
INTRA_ARTERIAL | Status: DC | PRN
  Administered 2015-08-10: 200 ug via INTRACORONARY

## 2015-08-10 MED ORDER — LOSARTAN POTASSIUM 50 MG PO TABS
50.0000 mg | ORAL_TABLET | Freq: Every day | ORAL | Status: DC
  Administered 2015-08-10 – 2015-08-13 (×4): 50 mg via ORAL
  Filled 2015-08-10 (×4): qty 1

## 2015-08-10 MED ORDER — SODIUM CHLORIDE 0.9 % IJ SOLN
3.0000 mL | Freq: Two times a day (BID) | INTRAMUSCULAR | Status: DC
  Administered 2015-08-10 – 2015-08-12 (×4): 3 mL via INTRAVENOUS

## 2015-08-10 MED ORDER — HEPARIN (PORCINE) IN NACL 100-0.45 UNIT/ML-% IJ SOLN
950.0000 [IU]/h | INTRAMUSCULAR | Status: DC
  Administered 2015-08-10: 950 [IU]/h via INTRAVENOUS
  Filled 2015-08-10: qty 250

## 2015-08-10 MED ORDER — HEPARIN (PORCINE) IN NACL 100-0.45 UNIT/ML-% IJ SOLN
950.0000 [IU]/h | INTRAMUSCULAR | Status: DC
  Filled 2015-08-10: qty 250

## 2015-08-10 MED ORDER — TIROFIBAN HCL IN NACL 5-0.9 MG/100ML-% IV SOLN
INTRAVENOUS | Status: DC | PRN
  Administered 2015-08-10: 0.15 ug/kg/min via INTRAVENOUS

## 2015-08-10 SURGICAL SUPPLY — 16 items
BALLN EUPHORA RX 2.0X12 (BALLOONS) ×3
BALLOON EUPHORA RX 2.0X12 (BALLOONS) ×2 IMPLANT
CATH INFINITI 5 FR JL3.5 (CATHETERS) ×3 IMPLANT
CATH INFINITI 5FR ANG PIGTAIL (CATHETERS) ×3 IMPLANT
DEVICE RAD COMP TR BAND LRG (VASCULAR PRODUCTS) ×3 IMPLANT
GLIDESHEATH SLEND SS 6F .021 (SHEATH) ×3 IMPLANT
GUIDE CATH RUNWAY 6FR FR4 (CATHETERS) ×3 IMPLANT
KIT ENCORE 26 ADVANTAGE (KITS) ×3 IMPLANT
KIT HEART LEFT (KITS) ×3 IMPLANT
PACK CARDIAC CATHETERIZATION (CUSTOM PROCEDURE TRAY) ×3 IMPLANT
SYR MEDRAD MARK V 150ML (SYRINGE) ×3 IMPLANT
TRANSDUCER W/STOPCOCK (MISCELLANEOUS) ×3 IMPLANT
TUBING CIL FLEX 10 FLL-RA (TUBING) ×3 IMPLANT
VALVE GUARDIAN II ~~LOC~~ HEMO (MISCELLANEOUS) ×3 IMPLANT
WIRE ASAHI PROWATER 180CM (WIRE) ×3 IMPLANT
WIRE SAFE-T 1.5MM-J .035X260CM (WIRE) ×3 IMPLANT

## 2015-08-10 NOTE — Progress Notes (Signed)
ANTICOAGULATION CONSULT NOTE - Initial Consult  Pharmacy Consult for Heparin Indication: chest pain/ACS  Allergies  Allergen Reactions  . Other Hives and Itching    Food Dye    Patient Measurements:   Heparin Dosing Weight: 80 kg  Vital Signs: Temp: 99.4 F (37.4 C) (01/15 1447) Temp Source: Oral (01/15 1447) BP: 158/89 mmHg (01/15 1447) Pulse Rate: 92 (01/15 1447)  Labs:  Recent Labs  08/10/15 1530  HGB 14.4  HCT 43.8  PLT 182    CrCl cannot be calculated (Unknown ideal weight.).   Medical History: Past Medical History  Diagnosis Date  . Hypertension     Medications:   (Not in a hospital admission) Scheduled:  . aspirin  324 mg Oral Once   Infusions:    Assessment: 75yo female with history of HTN presents with CP. Pharmacy is consulted to dose heparin for ACS/chest pain.   Goal of Therapy:  Heparin level 0.3-0.7 units/ml Monitor platelets by anticoagulation protocol: Yes   Plan:  Give 4000 units bolus x 1 Start heparin infusion at 950 units/hr Check anti-Xa level in 8 hours and daily while on heparin Continue to monitor H&H and platelets  Andrey Cota. Diona Foley, PharmD, [place] Clinical Pharmacist Pager [phone] 08/10/2015,4:01 PM

## 2015-08-10 NOTE — ED Provider Notes (Signed)
CSN: [id number]     Arrival date & time 08/10/15  1433 History   First MD Initiated Contact with Patient 08/10/15 1528     Chief Complaint  Patient presents with  . Polyuria  . Chest Pain   HPI  Patient presents with concern of chest pain, though the last episode of pain was yesterday, about 24 hours ago. Patient notes over the past month she has had chest pain with exertion, mild dyspnea with exertion. Typically her symptoms are present with initiation of exertion, but resolved, and she continues to exercise. Yesterday this was also the case, but the pain was more severe, more persistent than usual. Symptoms still resolved in a typical fashion. The pain has been anterior sternal, nonradiating. There has been no syncope, no asymmetric weakness. Patient has no history of cardiac disease, no prior cardiac evaluation. Patient only acknowledges a history of hypertension, for which she takes medication as directed.  Patient also has secondary concern of polyuria, with urinary frequency. She denies dysuria, hematuria.  No become the patient arrived as a code STEMI, and her initial care was discussed with cardiology. Code STEMI was canceled given the absence of chest pain, though the patient did have abnormal EKG  Past Medical History  Diagnosis Date  . Hypertension    Past Surgical History  Procedure Laterality Date  . Back surgery    . Abdominal hysterectomy    . Appendectomy    . Colonoscopy N/A 03/29/2013    Procedure: COLONOSCOPY;  Surgeon: Rogene Houston, MD;  Location: [facility];  Service: Endoscopy;  Laterality: N/A;  930   Family History  Problem Relation Age of Onset  . Colon cancer Neg Hx    Social History  Substance Use Topics  . Smoking status: Never Smoker   . Smokeless tobacco: None  . Alcohol Use: No   OB History    No data available     Review of Systems  Constitutional:       Per HPI, otherwise negative  HENT:       Per HPI, otherwise negative   Respiratory:       Per HPI, otherwise negative  Cardiovascular:       Per HPI, otherwise negative  Gastrointestinal: Negative for vomiting.  Endocrine:       Negative aside from HPI  Genitourinary:       Neg aside from HPI   Musculoskeletal:       Per HPI, otherwise negative  Skin: Negative.   Neurological: Negative for syncope.      Allergies  Other  Home Medications   Prior to Admission medications   Medication Sig Start Date End Date Taking? Authorizing Provider  amLODipine (NORVASC) 10 MG tablet Take 10 mg by mouth daily.    Historical Provider, MD  lubiprostone (AMITIZA) 24 MCG capsule Take 1 capsule (24 mcg total) by mouth daily. 02/28/15   Butch Penny, NP  lubiprostone (AMITIZA) 24 MCG capsule Take 1 capsule (24 mcg total) by mouth 2 (two) times daily with a meal. 05/26/15   Butch Penny, NP  triamterene-hydrochlorothiazide (MAXZIDE) 75-50 MG tablet Take 1 tablet by mouth daily.    Historical Provider, MD   BP 158/89 mmHg  Pulse 92  Temp(Src) 99.4 F (37.4 C) (Oral)  Resp 20  SpO2 100% Physical Exam  Constitutional: She is oriented to person, place, and time. She appears well-developed and well-nourished. No distress.  HENT:  Head: Normocephalic and atraumatic.  Eyes: Conjunctivae and  EOM are normal.  Cardiovascular: Normal rate and regular rhythm.   Pulmonary/Chest: Effort normal and breath sounds normal. No stridor. No respiratory distress.  No chest pain, no tenderness to palpation  Abdominal: She exhibits no distension. There is no tenderness.  Musculoskeletal: She exhibits no edema.  Neurological: She is alert and oriented to person, place, and time. No cranial nerve deficit.  Skin: Skin is warm and dry.  Psychiatric: She has a normal mood and affect.  Nursing note and vitals reviewed.   ED Course  Procedures (including critical care time) Labs Review Labs Reviewed  BASIC METABOLIC PANEL - Abnormal; Notable for the following:    Glucose, Bld  105 (*)    All other components within normal limits  CBG MONITORING, ED - Abnormal; Notable for the following:    Glucose-Capillary 101 (*)    All other components within normal limits  I-STAT TROPOININ, ED - Abnormal; Notable for the following:    Troponin i, poc 5.94 (*)    All other components within normal limits  CBC  CBC  MAGNESIUM  PROTIME-INR  TROPONIN I  TROPONIN I  TROPONIN I  HEPARIN LEVEL (UNFRACTIONATED)  I-STAT TROPOININ, ED    Imaging Review Dg Chest 2 View  08/10/2015  CLINICAL DATA:  Abnormal EKG. Chest pressure beginning yesterday afternoon while walking dog. EXAM: CHEST  2 VIEW COMPARISON:  None. FINDINGS: Lungs are adequately inflated without consolidation or effusion. Cardiomediastinal silhouette is within normal. There is mild degenerative change of the spine. IMPRESSION: No active cardiopulmonary disease. Electronically Signed   By: Marin Olp M.D.   On: 08/10/2015 16:03   I have personally reviewed and evaluated these images and lab results as part of my medical decision-making.   EKG Interpretation   Date/Time:  Sunday August 10 2015 15:14:09 EST Ventricular Rate:  87 PR Interval:  216 QRS Duration: 82 QT Interval:  376 QTC Calculation: 452 R Axis:   -3 Text Interpretation:   ST elevations in inferior leads. Sinus rhythm with  1st degree A-V block ST elevation      Abnormal ekg Confirmed by Carmin Muskrat  MD  ([id number]) on 08/10/2015 3:59:55 PM     O2- 99%ra, nml Cardiac: 85 sr, nml  Initial trop >5.  D/W cardiology, Heparin started.   5:02 PM Patient continues to be smiling. She and her family are aware of all results. I again discussed with our cardiology colleagues. The patient is currently asymptomatic, with elevated troponin, inferior ST changes, patient will go to the catheterization lab.    MDM  Patient presents with new exertional chest pain, dyspnea. Notably, the patient has resolution of symptoms not with rest, but with  additional exertion. However, the patient has history of hypertension, and given her new ST changes, there was suspicion for coronary lesions. Patient's elevated troponin, was additionally concerning. Reassuring, the patient was in no pain, but with elevated troponin, concern for exertional chest pain, the patient was transferred to the catheterization lab after initiation of heparin in the emergency department.  CRITICAL CARE Performed by: Carmin Muskrat Total critical care time: 40 minutes Critical care time was exclusive of separately billable procedures and treating other patients. Critical care was necessary to treat or prevent imminent or life-threatening deterioration. Critical care was time spent personally by me on the following activities: development of treatment plan with patient and/or surrogate as well as nursing, discussions with consultants, evaluation of patient's response to treatment, examination of patient, obtaining history from patient or surrogate,  ordering and performing treatments and interventions, ordering and review of laboratory studies, ordering and review of radiographic studies, pulse oximetry and re-evaluation of patient's condition.   Carmin Muskrat, MD 08/10/15 [phone]

## 2015-08-10 NOTE — Progress Notes (Signed)
Right radial cath site increased from level 1 to level 2. Dr. Prescott Gum at bedside. 3cc air injected into TR band and pressure held. Dr. Irish Lack made aware and ordered to discontinue Aggrastat and continue to hold pressure and monitor site at this time.  Achille Rich, RN

## 2015-08-10 NOTE — H&P (Addendum)
Admit date: 08/10/2015 Referring Physician Dr. Vanita Panda Primary Cardiologist new Chief complaint/reason for admission: chest pain  HPI: 75 year old woman with history of hypertension who has been having exertional chest discomfort over the past month. On occasion, symptoms will resolve while she is walking. At other times, she has to stop walking. Yesterday, she had some exertional discomfort that it was more severe and it lasted longer than usual. She has not had any prior cardiac workup. Initial ECG showed a question of some inferior ST segment changes. Initially, a code STEMI was activated. However, there were no reciprocal changes on the ECG and she was pain-free. When I saw her in the emergency room, she did not report pain but has a slight discomfort in the center of the chest. This is similar to what she has had with exertion in the past. She denies any bleeding problems. She has no upcoming elective surgery.  She reports some frequent urination.  No family history of coronary artery disease. No history of tobacco use.      PMH:    Past Medical History  Diagnosis Date  . Hypertension     PSH:    Past Surgical History  Procedure Laterality Date  . Back surgery    . Abdominal hysterectomy    . Appendectomy    . Colonoscopy N/A 03/29/2013    Procedure: COLONOSCOPY;  Surgeon: Rogene Houston, MD;  Location: [facility];  Service: Endoscopy;  Laterality: N/A;  930    ALLERGIES:   Other and Peanut-containing drug products  Prior to Admit Meds:   Prescriptions prior to admission  Medication Sig Dispense Refill Last Dose  . amLODipine (NORVASC) 10 MG tablet Take 10 mg by mouth daily.   08/09/2015 at Unknown time  . losartan (COZAAR) 50 MG tablet Take 50 mg by mouth daily.   08/09/2015 at Unknown time  . lubiprostone (AMITIZA) 24 MCG capsule Take 1 capsule (24 mcg total) by mouth daily. (Patient not taking: Reported on 08/10/2015) 30 capsule 3 Not Taking at Unknown time  .  lubiprostone (AMITIZA) 24 MCG capsule Take 1 capsule (24 mcg total) by mouth 2 (two) times daily with a meal. (Patient not taking: Reported on 08/10/2015) 60 capsule 3 Not Taking at Unknown time   Family HX:    Family History  Problem Relation Age of Onset  . Colon cancer Neg Hx    Social HX:    Social History   Social History  . Marital Status: Divorced    Spouse Name: N/A  . Number of Children: N/A  . Years of Education: N/A   Occupational History  . Not on file.   Social History Main Topics  . Smoking status: Never Smoker   . Smokeless tobacco: Not on file  . Alcohol Use: No  . Drug Use: No  . Sexual Activity: Not on file   Other Topics Concern  . Not on file   Social History Narrative     ROS:  All 11 ROS were addressed and are negative except what is stated in the HPI  PHYSICAL EXAM Filed Vitals:   08/10/15 1545 08/10/15 1615  BP: 163/90 168/94  Pulse: 87 86  Temp:    Resp: 16    General: Well developed, well nourished, in no acute distress Head: Eyes PERRLA, No xanthomas.   Normal cephalic and atramatic  Lungs:   Clear bilaterally to auscultation and percussion. Heart:   HRRR S1 S2 Pulses are 2+ &  equal.            No carotid bruit. No JVD.  No abdominal bruits. No femoral bruits. Abdomen: Bowel sounds are positive, abdomen soft and non-tender without masses or                  Hernia's noted. Msk:  Back normal, normal gait. Normal strength and tone for age. Extremities:   No clubbing, cyanosis or edema.  DP +1 Neuro: Alert and oriented X 3. Psych:  Good affect, responds appropriately   Labs:   Lab Results  Component Value Date   WBC 5.5 08/10/2015   HGB 14.4 08/10/2015   HCT 43.8 08/10/2015   MCV 86.9 08/10/2015   PLT 182 08/10/2015    Recent Labs Lab 08/10/15 1530  NA 140  K 3.6  CL 103  CO2 22  BUN 14  CREATININE 0.84  CALCIUM 10.1  GLUCOSE 105*   No results found for: CKTOTAL, CKMB, CKMBINDEX, TROPONINI No results found for:  PTT No results found for: INR, PROTIME  No results found for: CHOL No results found for: HDL No results found for: LDLCALC No results found for: TRIG No results found for: CHOLHDL No results found for: LDLDIRECT    Radiology:  Dg Chest 2 View  08/10/2015  CLINICAL DATA:  Abnormal EKG. Chest pressure beginning yesterday afternoon while walking dog. EXAM: CHEST  2 VIEW COMPARISON:  None. FINDINGS: Lungs are adequately inflated without consolidation or effusion. Cardiomediastinal silhouette is within normal. There is mild degenerative change of the spine. IMPRESSION: No active cardiopulmonary disease. Electronically Signed   By: Marin Olp M.D.   On: 08/10/2015 16:03    EKG:  normal sinus rhythm, Q wave with ST elevation noted in lead 3   ASSESSMENT: ACUTE INFERIOR WALL MYOCARDIAL INFARCTION .  Now with mild discomfort which is persisting .  Hypertension   PLAN:  Plan for emergent cardiac cath due to ongoing symptoms.  She should be appropriate for drug-eluting stent placement if required. Will check urinalysis as well due to her frequent urination. Continue antiHTN meds.  She'll need beta blocker, statin and likely dual antiplatelet therapy. No history of stroke. Further plans based on the result of the cardiac cath.   Critical care time 40 mintues  Ereka Brau S., MD  08/10/2015  5:04 PM

## 2015-08-10 NOTE — Consult Note (Signed)
[address]       [place] [address]             [id number]        [name] [name] Laureles Medical Record [id number] Date of Birth: 12/28/1940  Referring: No ref. provider found Primary Care: Lanette Hampshire, MD  Chief Complaint:    Chief Complaint  Patient presents with  . Polyuria  . Chest Pain   patient examined, cardiac catheterization images personally reviewed and discussed with patient's cardiologist Dr. Irish Lack  History of Present Illness:      75 year old AA female with history of hypertension presents with several days of symptoms of unstable angina and recent rest pain. Cardiac enzymes were positive and EKG showed ST segment elevation in leads 2 and 3. Emergency cardiac catheterization via right radial artery demonstrated severe three-vessel coronary disease. There is a high-grade stenosis in the mid posterior descending but also severe ostial LAD and circumflex disease. PTCA of the posterior descending was performed. LV function is normal. LVEDP is low. The patient was felt to be candidate for multivessel CABG and was placed on Aggrastat. The patient is currently in the ICU comfortable without chest pain.  Current Activity/ Functional Status: The patient lives with her daughter and granddaughter. 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   Zubrod Score: At the time of surgery this patient's most appropriate activity status/level should be described as: []     0    Normal activity, no symptoms []     1    Restricted in physical strenuous activity but ambulatory, able to do out light work [x]     2    Ambulatory and capable of self care, unable to do work activities, up and about                 more than 50%  Of the time                            []     3    Only limited self care, in bed greater than 50% of waking hours []     4    Completely disabled, no self care, confined to bed or chair []     5     Moribund  Past Medical History  Diagnosis Date  . Hypertension     Past Surgical History  Procedure Laterality Date  . Back surgery    . Abdominal hysterectomy    . Appendectomy    . Colonoscopy N/A 03/29/2013    Procedure: COLONOSCOPY;  Surgeon: Rogene Houston, MD;  Location: [facility];  Service: Endoscopy;  Laterality: N/A;  930    History  Smoking status  . Never Smoker   Smokeless tobacco  . Not on file    History  Alcohol Use No   None Social History   Social History  . Marital Status: Divorced    Spouse Name: N/A  . Number of Children: N/A  . Years of Education: N/A   Occupational History  . Not on file.   Social History Main Topics  . Smoking status: Never Smoker   . Smokeless tobacco: Not on file  . Alcohol Use: No  . Drug Use: No  . Sexual Activity: Not on file   Other Topics Concern  . Not on file   Social History Narrative  Allergies  Allergen Reactions  . Other Hives and Itching    Food Dye Red Dye #40  . Peanut-Containing Drug Products Rash    Mild rash    Current Facility-Administered Medications  Medication Dose Route Frequency Provider Last Rate Last Dose  . 0.9 %  sodium chloride infusion  250 mL Intravenous PRN [name], MD      . 0.9% sodium chloride infusion  3 mL/kg/hr Intravenous Continuous [name], MD 800 mL/hr at 08/10/15 1830 10 mL/kg/hr at 08/10/15 1830  . acetaminophen (TYLENOL) tablet 650 mg  650 mg Oral Q4H PRN [name], MD      . amLODipine (NORVASC) tablet 10 mg  10 mg Oral Daily [name], MD      . Derrill Memo ON 08/11/2015] aspirin chewable tablet 81 mg  81 mg Oral Daily [name], MD      . Derrill Memo ON 08/11/2015] atorvastatin (LIPITOR) tablet 80 mg  80 mg Oral q1800 [name], MD      . Derrill Memo ON 08/11/2015] heparin ADULT infusion 100 units/mL (25000 units/250 mL)  950 Units/hr Intravenous Continuous [name], MD      . losartan (COZAAR) tablet  50 mg  50 mg Oral Daily [name], MD      . metoprolol tartrate (LOPRESSOR) tablet 12.5 mg  12.5 mg Oral BID [name], MD      . ondansetron [facility]) injection 4 mg  4 mg Intravenous Q6H PRN [name], MD      . sodium chloride 0.9 % injection 3 mL  3 mL Intravenous Q12H [name], MD      . sodium chloride 0.9 % injection 3 mL  3 mL Intravenous PRN [name], MD      . tirofiban (AGGRASTAT) infusion 50 mcg/mL 100 mL  0.15 mcg/kg/min Intravenous Continuous [name], MD 14.4 mL/hr at 08/10/15 1830 0.15 mcg/kg/min at 08/10/15 1830    Prescriptions prior to admission  Medication Sig Dispense Refill Last Dose  . amLODipine (NORVASC) 10 MG tablet Take 10 mg by mouth daily.   08/09/2015 at Unknown time  . losartan (COZAAR) 50 MG tablet Take 50 mg by mouth daily.   08/09/2015 at Unknown time  . lubiprostone (AMITIZA) 24 MCG capsule Take 1 capsule (24 mcg total) by mouth daily. (Patient not taking: Reported on 08/10/2015) 30 capsule 3 Not Taking at Unknown time  . lubiprostone (AMITIZA) 24 MCG capsule Take 1 capsule (24 mcg total) by mouth 2 (two) times daily with a meal. (Patient not taking: Reported on 08/10/2015) 60 capsule 3 Not Taking at Unknown time    Family History  Problem Relation Age of Onset  . Colon cancer Neg Hx      Review of Systems:       Cardiac Review of Systems: Y or N  Chest Pain [   yes ]  Resting SOB [ no  ] Exertional SOB  [place]  ]  Orthopnea [ no ]   Pedal Edema [  mild ]    Palpitations [place]  ] Syncope  [ no ]   Presyncope [ no  ]  General Review of Systems: [Y] = yes [  ]=no Constitional: recent weight change [  ]; anorexia [  ]; fatigue [  ]; nausea [  ]; night sweats [  ]; fever [  ]; or chills [  ]  Dental: poor dentition[  ]; Last Dentist visit: Every 6 months  Eye : blurred vision [  ]; diplopia [   ]; vision changes [  ];  Amaurosis fugax[   ]; Resp: cough [  ];  wheezing[  ];  hemoptysis[  ]; shortness of breath[  ]; paroxysmal nocturnal dyspnea[  ]; dyspnea on exertion[  ]; or orthopnea[  ];  GI:  Chronic constipation-irritable bowel syndrome gallstones[  ], vomiting[  ];  dysphagia[  ]; melena[  ];  hematochezia [  ]; heartburn[yes  ];   Hx of  [place] y[yes-polyp removed  ]; status post appendectomy GU: kidney stones [  ]; hematuria[  ];   dysuria [  ];  nocturia[  ];  history of     obstruction [  ]; urinary frequency [  ]             Skin: rash, swelling[  ];, hair loss[  ];  peripheral edema[  ];  or itching[  ]; Musculosketetal: myalgias[  ];  joint swelling[  ];  joint erythema[  ];  joint pain[  ];  back pain[yes status post lumbar laminectomy  ];  Heme/Lymph: bruising[  ];  bleeding[  ];  anemia[  ];  Neuro: TIA[  ];  headaches[  ];  stroke[  ];  vertigo[  ];  seizures[  ];   paresthesias[  ];  difficulty walking[  ];  Psych:depression[  ]; anxiety[  ];  Endocrine: diabetes[ no ];  thyroid dysfunction[  ];  Immunizations: Flu [  ]; Pneumococcal[  ];  Other: Right hand dominant  Physical Exam: BP 133/82 mmHg  Pulse 106  Temp(Src) 99.4 F (37.4 C) (Oral)  Resp 0  Ht 5\' 10"  (1.778 m)  Wt 176 lb 5.9 oz (80 kg)  BMI 25.31 kg/m2  SpO2 0%      Physical Exam  General: Very nice middle-aged AA female resting comfortably in the ICU after cardiac catheterization HEENT: Normocephalic pupils equal , dentition adequate Neck: Supple without JVD, adenopathy, or bruit Chest: Clear to auscultation, symmetrical breath sounds, no rhonchi, no tenderness             or deformity Cardiovascular: Regular rate and rhythm, no murmur, no gallop, peripheral pulses             palpable in all extremities Abdomen:  Soft, nontender, no palpable mass or organomegaly Extremities: Warm, well-perfused, no clubbing cyanosis edema or tenderness, mild hematoma at cardiac cath site right wrist              no venous stasis changes of the  legs Rectal/GU: Deferred Neuro: Grossly non--focal and symmetrical throughout Skin: Clean and dry without rash or ulceration    Diagnostic Studies & Laboratory data:     Recent Radiology Findings:   Dg Chest 2 View  08/10/2015  CLINICAL DATA:  Abnormal EKG. Chest pressure beginning yesterday afternoon while walking dog. EXAM: CHEST  2 VIEW COMPARISON:  None. FINDINGS: Lungs are adequately inflated without consolidation or effusion. Cardiomediastinal silhouette is within normal. There is mild degenerative change of the spine. IMPRESSION: No active cardiopulmonary disease. Electronically Signed   By: Marin Olp M.D.   On: 08/10/2015 16:03     I have independently reviewed the above radiologic studies.  Recent Lab Findings: Lab Results  Component Value Date   WBC 5.5 08/10/2015   HGB 14.4 08/10/2015   HCT 43.8 08/10/2015   PLT 182 08/10/2015   GLUCOSE 105*  08/10/2015   NA 140 08/10/2015   K 3.6 08/10/2015   CL 103 08/10/2015   CREATININE 0.84 08/10/2015   BUN 14 08/10/2015   CO2 22 08/10/2015      Assessment / Plan:     Unstable angina    Acute MI, severe three-vessel coronary disease  Multivessel CABG has been recommended by her cardiologist and I agree that is the best long-term therapy for her severe coronary disease with preserved LV function.  We'll allow recovery from inferior MI and scheduled for surgery later in the week-first OR opening is Thursday, January 19. We will stop the Aggrastat 8 hours before surgery.       @ME1 @ 08/10/2015 6:57 PM

## 2015-08-10 NOTE — ED Provider Notes (Signed)
Patient seen in triage after reviewing her EKG. There is some ST segment elevation in inferior leads. No obvious reciprocal changes. No prior EKG for comparison purposes. She describes chest pressure which began yesterday afternoon while walking her dog. She has had some vague discomfort since then. Currently complaining of feeling "not right" and points to her epigastrium. Also many other complaints such as polyuria and cramping in her legs.  On exam, she is in no acute distress. She generally appears well. She is all over the place with regards to her complaints but she primarily did present with chest pain. Concerning enough history that she was made a "Code STEMI."   Virgel Manifold, MD 08/10/15 1533

## 2015-08-10 NOTE — ED Notes (Signed)
Pt c/o CP non radiating onset yesterday, c/o SOB worse with exertion, pt c/o polyuria, pt reports taking Lasix, pt states, "My doctor changed it to a lower dose because I was just peeing every 10 minutes." pt denies n/v/d

## 2015-08-10 NOTE — Progress Notes (Signed)
ANTICOAGULATION CONSULT NOTE - Initial Consult  Pharmacy Consult for Heparin Indication: chest pain/ACS  Allergies  Allergen Reactions  . Other Hives and Itching    Food Dye Red Dye #40  . Peanut-Containing Drug Products Rash    Mild rash    Patient Measurements: Height: 5\' 10"  (177.8 cm) Weight: 176 lb 5.9 oz (80 kg) IBW/kg (Calculated) : 68.5 Heparin Dosing Weight: 80 kg  Vital Signs: Temp: 99.4 F (37.4 C) (01/15 1447) Temp Source: Oral (01/15 1447) BP: 141/76 mmHg (01/15 1900) Pulse Rate: 73 (01/15 1900)  Labs:  Recent Labs  08/10/15 1530  HGB 14.4  HCT 43.8  PLT 182  CREATININE 0.84    Estimated Creatinine Clearance: 63.5 mL/min (by C-G formula based on Cr of 0.84).   Medical History: Past Medical History  Diagnosis Date  . Hypertension     Medications:  Prescriptions prior to admission  Medication Sig Dispense Refill Last Dose  . amLODipine (NORVASC) 10 MG tablet Take 10 mg by mouth daily.   08/09/2015 at Unknown time  . losartan (COZAAR) 50 MG tablet Take 50 mg by mouth daily.   08/09/2015 at Unknown time  . lubiprostone (AMITIZA) 24 MCG capsule Take 1 capsule (24 mcg total) by mouth daily. (Patient not taking: Reported on 08/10/2015) 30 capsule 3 Not Taking at Unknown time  . lubiprostone (AMITIZA) 24 MCG capsule Take 1 capsule (24 mcg total) by mouth 2 (two) times daily with a meal. (Patient not taking: Reported on 08/10/2015) 60 capsule 3 Not Taking at Unknown time   Scheduled:  . amLODipine  10 mg Oral Daily  . [START ON 08/11/2015] aspirin  81 mg Oral Daily  . [START ON 08/11/2015] atorvastatin  80 mg Oral q1800  . losartan  50 mg Oral Daily  . metoprolol tartrate  12.5 mg Oral BID  . sodium chloride  3 mL Intravenous Q12H   Infusions:  . sodium chloride    . [START ON 08/11/2015] heparin    . tirofiban Stopped (08/10/15 1900)    Assessment: 75yo female with history of HTN presents with CP. Patient is noted s/p cath #VCAD and plans for CABG on  1/19. Heparin has been restarted on 1/16 at [place] Per Dr. Irish Lack at 950 units/hr  Goal of Therapy:  Heparin level 0.3-0.7 units/ml Monitor platelets by anticoagulation protocol: Yes   Plan:  -Resume heparin on 1/16 at 0030 -Heparin level in 8 hours and daily wth CBC daily  Hildred Laser, Pharm D 08/10/2015 7:50 PM

## 2015-08-10 NOTE — ED Notes (Signed)
Pt not noted upon arrival to ED to have Chest pain, upon my triage assessment the pt reported CP

## 2015-08-10 NOTE — Progress Notes (Signed)
   08/10/15 1700  Clinical Encounter Type  Visited With Patient;Family;Patient and family together  Visit Type Initial;ED;Code;Spiritual support;Social support  Referral From Nurse  Spiritual Encounters  Spiritual Needs Prayer;Emotional  [place] responded to CODE STEMI, which was cancelled but pt still brought to CATH lab; [facility] met daughter who brought in pt; [place] spoke with pt and family and offered emotional and spiritual support. 5:24 PM  Gwynn Burly

## 2015-08-11 ENCOUNTER — Encounter (HOSPITAL_COMMUNITY): Payer: Self-pay | Admitting: Interventional Cardiology

## 2015-08-11 ENCOUNTER — Other Ambulatory Visit: Payer: Self-pay | Admitting: *Deleted

## 2015-08-11 ENCOUNTER — Ambulatory Visit (HOSPITAL_COMMUNITY): Payer: Medicare PPO

## 2015-08-11 DIAGNOSIS — I2510 Atherosclerotic heart disease of native coronary artery without angina pectoris: Secondary | ICD-10-CM | POA: Diagnosis not present

## 2015-08-11 LAB — COMPREHENSIVE METABOLIC PANEL
ALT: 17 U/L (ref 14–54)
AST: 31 U/L (ref 15–41)
Albumin: 3.5 g/dL (ref 3.5–5.0)
Alkaline Phosphatase: 55 U/L (ref 38–126)
Anion gap: 6 (ref 5–15)
BUN: 12 mg/dL (ref 6–20)
CO2: 24 mmol/L (ref 22–32)
Calcium: 9.1 mg/dL (ref 8.9–10.3)
Chloride: 109 mmol/L (ref 101–111)
Creatinine, Ser: 0.77 mg/dL (ref 0.44–1.00)
GFR calc Af Amer: >60 mL/min (ref >60)
GFR calc non Af Amer: >60 mL/min (ref >60)
Glucose, Bld: 112 mg/dL — ABNORMAL HIGH (ref 65–99)
Potassium: 3.4 mmol/L — ABNORMAL LOW (ref 3.5–5.1)
Sodium: 139 mmol/L (ref 135–145)
Total Bilirubin: 0.9 mg/dL (ref 0.3–1.2)
Total Protein: 6.3 g/dL — ABNORMAL LOW (ref 6.5–8.1)

## 2015-08-11 LAB — CBC
HEMATOCRIT: 38.2 % (ref 36.0–46.0)
Hemoglobin: 12.3 g/dL (ref 12.0–15.0)
MCH: 28.1 pg (ref 26.0–34.0)
MCHC: 32.2 g/dL (ref 30.0–36.0)
MCV: 87.4 fL (ref 78.0–100.0)
PLATELETS: 158 10*3/uL (ref 150–400)
RBC: 4.37 MIL/uL (ref 3.87–5.11)
RDW: 13.5 % (ref 11.5–15.5)
WBC: 4.1 10*3/uL (ref 4.0–10.5)

## 2015-08-11 LAB — LIPID PANEL
Cholesterol: 157 mg/dL (ref 0–200)
HDL: 51 mg/dL (ref >40)
LDL Cholesterol: 91 mg/dL (ref 0–99)
Total CHOL/HDL Ratio: 3.1 RATIO
Triglycerides: 73 mg/dL (ref <150)
VLDL: 15 mg/dL (ref 0–40)

## 2015-08-11 LAB — CK TOTAL AND CKMB (NOT AT ARMC)
CK, MB: 10.3 ng/mL — ABNORMAL HIGH (ref 0.5–5.0)
CK, MB: 17.9 ng/mL — ABNORMAL HIGH (ref 0.5–5.0)
RELATIVE INDEX: 7.2 — AB (ref 0.0–2.5)
Relative Index: 5.5 — ABNORMAL HIGH (ref 0.0–2.5)
Total CK: 187 U/L (ref 38–234)
Total CK: 247 U/L — ABNORMAL HIGH (ref 38–234)

## 2015-08-11 LAB — PROTIME-INR
INR: 1.15 (ref 0.00–1.49)
Prothrombin Time: 14.8 seconds (ref 11.6–15.2)

## 2015-08-11 LAB — TSH: TSH: 2.467 u[IU]/mL (ref 0.350–4.500)

## 2015-08-11 LAB — POCT ACTIVATED CLOTTING TIME: ACTIVATED CLOTTING TIME: 327 s

## 2015-08-11 LAB — HEPARIN LEVEL (UNFRACTIONATED): Heparin Unfractionated: 0.3 IU/mL (ref 0.30–0.70)

## 2015-08-11 MED ORDER — POTASSIUM CHLORIDE CRYS ER 20 MEQ PO TBCR
40.0000 meq | EXTENDED_RELEASE_TABLET | Freq: Once | ORAL | Status: AC
  Administered 2015-08-11: 40 meq via ORAL
  Filled 2015-08-11: qty 2

## 2015-08-11 MED ORDER — HEPARIN (PORCINE) IN NACL 100-0.45 UNIT/ML-% IJ SOLN
1000.0000 [IU]/h | INTRAMUSCULAR | Status: DC
  Administered 2015-08-11: 950 [IU]/h via INTRAVENOUS
  Administered 2015-08-12 – 2015-08-13 (×2): 1000 [IU]/h via INTRAVENOUS
  Filled 2015-08-11 (×5): qty 250

## 2015-08-11 MED FILL — Verapamil HCl IV Soln 2.5 MG/ML: INTRAVENOUS | Qty: 2 | Status: AC

## 2015-08-11 NOTE — Progress Notes (Signed)
Patient denied any chest pain or pain anywhere else during my shift. TR band has been removed and the right arm is elevated with two pillows since there is hematoma above the site still. Hematoma has not worsened, will continue to monitor the site closely. Maintained NSR.

## 2015-08-11 NOTE — Progress Notes (Signed)
SUBJECTIVE:  Denies any chest pain   OBJECTIVE:   Vitals:   Filed Vitals:   08/11/15 0800 08/11/15 0840 08/11/15 0900 08/11/15 1000  BP: 110/73   106/62  Pulse: 74  80 78  Temp:  98.7 F (37.1 C)    TempSrc:  Oral    Resp: 18  19 22   Height:      Weight:      SpO2: 97%  100% 99%   I&O's:   Intake/Output Summary (Last 24 hours) at 08/11/15 1117 Last data filed at 08/11/15 0944  Gross per 24 hour  Intake    423 ml  Output    900 ml  Net   -477 ml   TELEMETRY: Reviewed telemetry pt in NSR:     PHYSICAL EXAM General: Well developed, well nourished, in no acute distress Head: Eyes PERRLA, No xanthomas.   Normal cephalic and atramatic  Lungs:   Clear bilaterally to auscultation and percussion. Heart:   HRRR S1 S2 Pulses are 2+ & equal. Abdomen: Bowel sounds are positive, abdomen soft and non-tender without masses  Extremities:   No clubbing, cyanosis or edema.  DP +1 Neuro: Alert and oriented X 3. Psych:  Good affect, responds appropriately Right radial artery site with some mild bruising but no hematoma.  Intact radial artery pulse  LABS: Basic Metabolic Panel:  Recent Labs  08/10/15 1530 08/10/15 2017 08/11/15 0611  NA 140  --  139  K 3.6  --  3.4*  CL 103  --  109  CO2 22  --  24  GLUCOSE 105*  --  112*  BUN 14  --  12  CREATININE 0.84  --  0.77  CALCIUM 10.1  --  9.1  MG  --  2.1  --    Liver Function Tests:  Recent Labs  08/11/15 0611  AST 31  ALT 17  ALKPHOS 55  BILITOT 0.9  PROT 6.3*  ALBUMIN 3.5   No results for input(s): LIPASE, AMYLASE in the last 72 hours. CBC:  Recent Labs  08/10/15 2017 08/10/15 2331 08/11/15 0611  WBC 5.6  --  4.1  HGB 13.0  --  12.3  HCT 40.2  --  38.2  MCV 87.4  --  87.4  PLT 178 167 158   Cardiac Enzymes:  Recent Labs  08/10/15 2331 08/11/15 0611  CKTOTAL 247* 187  CKMB 17.9* 10.3*   BNP: Invalid input(s): POCBNP D-Dimer: No results for input(s): DDIMER in the last 72 hours. Hemoglobin  A1C: No results for input(s): HGBA1C in the last 72 hours. Fasting Lipid Panel:  Recent Labs  08/11/15 0611  CHOL 157  HDL 51  LDLCALC 91  TRIG 73  CHOLHDL 3.1   Thyroid Function Tests:  Recent Labs  08/11/15 0611  TSH 2.467   Anemia Panel: No results for input(s): VITAMINB12, FOLATE, FERRITIN, TIBC, IRON, RETICCTPCT in the last 72 hours. Coag Panel:   Lab Results  Component Value Date   INR 1.15 08/11/2015   INR 1.21 08/10/2015    RADIOLOGY: Dg Chest 2 View  08/10/2015  CLINICAL DATA:  Abnormal EKG. Chest pressure beginning yesterday afternoon while walking dog. EXAM: CHEST  2 VIEW COMPARISON:  None. FINDINGS: Lungs are adequately inflated without consolidation or effusion. Cardiomediastinal silhouette is within normal. There is mild degenerative change of the spine. IMPRESSION: No active cardiopulmonary disease. Electronically Signed   By: Marin Olp M.D.   On: 08/10/2015 16:03    ASSESSMENT/PLAN:  1.  ACUTE INFERIOR WALL MYOCARDIAL INFARCTION .IV heparin was stopped last night due to  Right radial artery hematoma at cath site.  This has resolved this am.  Will restart Heparin with no bolus. CVTS had mentioned Aggrastat in their note to continue until surgery but she is not on this and will hold off due to recent bleeding. 2.  Hypertension - controlled - continue amlodipine/ARB/BB 3.  ASCAD with cath showing 90% ramus, 75% ostial to prox LAD, RPDA 80% and then occluded (culprit lesion) s/p angioplasty to occluded portion of PDA.  Plan for CABG on Thursday due to complex disease in left system.  Continue ASA.  No oral antiplatelet therapy due to need for CABG.  Continue BB and high dose statin.   4.  Hypokalemia - replete  Sueanne Margarita, MD  08/11/2015  11:17 AM

## 2015-08-11 NOTE — Progress Notes (Signed)
Informed by overnight fellow that IV heparin has stopped due to R wrist post cath hematoma. Pending CABG.   Checked on her this morning, hematoma largely resolved, still mild tenderness in wrist area. Will check again later on rounding after bandage removal, if still plan, plan to restart IV heparin around noon.  Hilbert Corrigan PA Pager: [phone]

## 2015-08-11 NOTE — Progress Notes (Signed)
ANTICOAGULATION CONSULT NOTE - Follow Up Consult  Pharmacy Consult for Heparin Indication: Severe CAD awaiting CABG 1/19  Allergies  Allergen Reactions  . Other Hives and Itching    Food Dye Red Dye #40  . Peanut-Containing Drug Products Rash    Mild rash    Patient Measurements: Height: 5\' 10"  (177.8 cm) Weight: 172 lb (78.019 kg) IBW/kg (Calculated) : 68.5 Heparin Dosing Weight: 78 kg  Vital Signs: Temp: 97.9 F (36.6 C) (01/16 2021) Temp Source: Oral (01/16 2021) BP: 104/55 mmHg (01/16 2000) Pulse Rate: 80 (01/16 2000)  Labs:  Recent Labs  08/10/15 1530 08/10/15 2017 08/10/15 2331 08/11/15 0611 08/11/15 2202  HGB 14.4 13.0  --  12.3  --   HCT 43.8 40.2  --  38.2  --   PLT 182 178 167 158  --   LABPROT  --  15.4*  --  14.8  --   INR  --  1.21  --  1.15  --   HEPARINUNFRC  --   --   --   --  0.30  CREATININE 0.84  --   --  0.77  --   CKTOTAL  --   --  247* 187  --   CKMB  --   --  17.9* 10.3*  --     Estimated Creatinine Clearance: 66.7 mL/min (by C-G formula based on Cr of 0.77).   Medications:  Heparin @ 950 units/hr (9.5 ml/hr)  Assessment: 75yo female with history of HTN presented with CP. Patient is noted s/p cath 3VCAD and plans for CABG on 1/19. Heparin was ordered to be 1/16 at [place] Per Dr. Irish Lack at 950 units/hr but was not started due to R arm hematoma above TR band site. Aggrastat was also stopped last night. R wrist hematoma has largely resolved and pharmacy consulted to dose heparin with no bolus.   Heparin level this evening is therapeutic but at the lower end of the range (HL 0.3, goal of 0.3-0.7). Per discussion with RN - hematoma is stable and has not worsened. Will increase the drip rate slightly to keep within range.   Goal of Therapy:  Heparin level 0.3-0.7 units/ml Monitor platelets by anticoagulation protocol: Yes   Plan:  1. Increase heparin drip rate slightly to 1000 units/hr (10 ml/hr) 2. Will continue to monitor for any  signs/symptoms of bleeding and will follow up with heparin level in 8 hours  Alycia Rossetti, PharmD, BCPS Clinical Pharmacist Pager: [phone] 08/11/2015 10:26 PM

## 2015-08-11 NOTE — Progress Notes (Signed)
[place] for Heparin Indication: chest pain/ACS  Allergies  Allergen Reactions  . Other Hives and Itching    Food Dye Red Dye #40  . Peanut-Containing Drug Products Rash    Mild rash    Patient Measurements: Height: 5\' 10"  (177.8 cm) Weight: 172 lb (78.019 kg) IBW/kg (Calculated) : 68.5 Heparin Dosing Weight: 80 kg  Vital Signs: Temp: 98.3 F (36.8 C) (01/16 1206) Temp Source: Oral (01/16 1206) BP: 98/70 mmHg (01/16 1300) Pulse Rate: 62 (01/16 1300)  Labs:  Recent Labs  08/10/15 1530 08/10/15 2017 08/10/15 2331 08/11/15 0611  HGB 14.4 13.0  --  12.3  HCT 43.8 40.2  --  38.2  PLT 182 178 167 158  LABPROT  --  15.4*  --  14.8  INR  --  1.21  --  1.15  CREATININE 0.84  --   --  0.77  CKTOTAL  --   --  247* 187  CKMB  --   --  17.9* 10.3*    Estimated Creatinine Clearance: 66.7 mL/min (by C-G formula based on Cr of 0.77).   Medical History: Past Medical History  Diagnosis Date  . Hypertension     Assessment: 75yo female with history of HTN presented with CP. Patient is noted s/p cath 3VCAD and plans for CABG on 1/19. Heparin was ordered to be1/16 at [place] Per Dr. Irish Lack at 950 units/hr but was not started due to R arm hematoma above TR band site.  Aggrastat was also stopped last night.   R wrist hematoma has largely resolved and pharmacy consulted to dose heparin with no bolus.  Hg 12.3, pltc 158.   Goal of Therapy:  Heparin level 0.3-0.7 units/ml Monitor platelets by anticoagulation protocol: Yes   Plan:  -Resume heparin with no bolus at rate of 950 units/hr -Heparin level in 8 hours and daily with CBC daily  Eudelia Bunch, Pharm.D. [id number] 08/11/2015 1:16 PM

## 2015-08-11 NOTE — Progress Notes (Signed)
Utilization review completed. Andras Grunewald, RN, BSN. 

## 2015-08-11 NOTE — Progress Notes (Signed)
CARDIAC REHAB PHASE I   PRE:  Rate/Rhythm:81 SR  BP:  Sitting: 110/73        SaO2: 98 RA  MODE:  Ambulation: 600 ft   POST:  Rate/Rhythm: 99 SR  BP:  Sitting: 121/65         SaO2: 100 RA  Pt up in chair, finishing breakfast,eager to walk. Pt ambulated 600 ft on RA, hand held assist, steady gait, tolerated well. Pt denies any chest pain or DOE, declined rest stop. Completed cardiac surgery pre-op education. Reviewed IS, sternal precautions, activity progression, cardiac surgery booklet and cardiac surgery guidelines. Pt verbalized understanding. Pt states her nurse mentioned playing the cardiac surgery videos for her. Pt to recliner after walk, call bell within reach. Will follow.    Lenna Sciara, RN, BSN 08/11/2015 9:33 AM

## 2015-08-11 NOTE — Progress Notes (Signed)
Echocardiogram 2D Echocardiogram has been performed.  [name] [name] 08/11/2015, 3:12 PM

## 2015-08-12 ENCOUNTER — Inpatient Hospital Stay (HOSPITAL_COMMUNITY): Payer: Medicare PPO

## 2015-08-12 ENCOUNTER — Ambulatory Visit (HOSPITAL_COMMUNITY): Payer: Medicare PPO

## 2015-08-12 DIAGNOSIS — I25110 Atherosclerotic heart disease of native coronary artery with unstable angina pectoris: Secondary | ICD-10-CM

## 2015-08-12 DIAGNOSIS — I2510 Atherosclerotic heart disease of native coronary artery without angina pectoris: Secondary | ICD-10-CM

## 2015-08-12 LAB — HEPARIN LEVEL (UNFRACTIONATED): HEPARIN UNFRACTIONATED: 0.44 [IU]/mL (ref 0.30–0.70)

## 2015-08-12 LAB — BASIC METABOLIC PANEL
ANION GAP: 8 (ref 5–15)
BUN: 17 mg/dL (ref 6–20)
CO2: 26 mmol/L (ref 22–32)
Calcium: 9.4 mg/dL (ref 8.9–10.3)
Chloride: 109 mmol/L (ref 101–111)
Creatinine, Ser: 0.92 mg/dL (ref 0.44–1.00)
GFR calc Af Amer: >60 mL/min (ref >60)
GFR calc non Af Amer: 60 mL/min — ABNORMAL LOW (ref >60)
GLUCOSE: 89 mg/dL (ref 65–99)
POTASSIUM: 4.2 mmol/L (ref 3.5–5.1)
Sodium: 143 mmol/L (ref 135–145)

## 2015-08-12 LAB — SPIROMETRY WITH GRAPH
FEF 25-75 Post: 2.07 L/sec
FEF 25-75 Pre: 1.77 L/sec
FEF2575-%Change-Post: 17 %
FEF2575-%Pred-Post: 105 %
FEF2575-%Pred-Pre: 90 %
FEV1-%Change-Post: 3 %
FEV1-%Pred-Post: 97 %
FEV1-%Pred-Pre: 94 %
FEV1-Post: 2.21 L
FEV1-Pre: 2.14 L
FEV1FVC-%Change-Post: 5 %
FEV1FVC-%Pred-Pre: 99 %
FEV6-%Change-Post: 0 %
FEV6-%Pred-Post: 98 %
FEV6-%Pred-Pre: 98 %
FEV6-Post: 2.77 L
FEV6-Pre: 2.77 L
FEV6FVC-%Change-Post: 1 %
FEV6FVC-%Pred-Post: 102 %
FEV6FVC-%Pred-Pre: 100 %
FVC-%Change-Post: -1 %
FVC-%Pred-Post: 95 %
FVC-%Pred-Pre: 96 %
FVC-Post: 2.78 L
FVC-Pre: 2.83 L
Post FEV1/FVC ratio: 80 %
Post FEV6/FVC ratio: 100 %
Pre FEV1/FVC ratio: 76 %
Pre FEV6/FVC Ratio: 98 %

## 2015-08-12 LAB — CBC
HCT: 37 % (ref 36.0–46.0)
Hemoglobin: 11.8 g/dL — ABNORMAL LOW (ref 12.0–15.0)
MCH: 28.4 pg (ref 26.0–34.0)
MCHC: 31.9 g/dL (ref 30.0–36.0)
MCV: 88.9 fL (ref 78.0–100.0)
PLATELETS: 146 10*3/uL — AB (ref 150–400)
RBC: 4.16 MIL/uL (ref 3.87–5.11)
RDW: 13.7 % (ref 11.5–15.5)
WBC: 4.5 10*3/uL (ref 4.0–10.5)

## 2015-08-12 LAB — HEMOGLOBIN A1C
HEMOGLOBIN A1C: 6.1 % — AB (ref 4.8–5.6)
MEAN PLASMA GLUCOSE: 128 mg/dL

## 2015-08-12 MED ORDER — ALBUTEROL SULFATE (2.5 MG/3ML) 0.083% IN NEBU
2.5000 mg | INHALATION_SOLUTION | Freq: Once | RESPIRATORY_TRACT | Status: AC
  Administered 2015-08-12: 2.5 mg via RESPIRATORY_TRACT

## 2015-08-12 NOTE — Progress Notes (Signed)
ANTICOAGULATION CONSULT NOTE - Follow Up Consult  Pharmacy Consult for Heparin Indication: Severe CAD awaiting CABG 1/19  Allergies  Allergen Reactions  . Other Hives and Itching    Food Dye Red Dye #40  . Peanut-Containing Drug Products Rash    Mild rash    Patient Measurements: Height: 5\' 10"  (177.8 cm) Weight: 177 lb (80.287 kg) IBW/kg (Calculated) : 68.5 Heparin Dosing Weight: 78 kg  Vital Signs: Temp: 98.6 F (37 C) (01/17 1313) Temp Source: Oral (01/17 1313) BP: 123/53 mmHg (01/17 1313) Pulse Rate: 80 (01/17 1313)  Labs:  Recent Labs  08/10/15 1530 08/10/15 2017 08/10/15 2331 08/11/15 0611 08/11/15 2202 08/12/15 0249 08/12/15 1241  HGB 14.4 13.0  --  12.3  --  11.8*  --   HCT 43.8 40.2  --  38.2  --  37.0  --   PLT 182 178 167 158  --  146*  --   LABPROT  --  15.4*  --  14.8  --   --   --   INR  --  1.21  --  1.15  --   --   --   HEPARINUNFRC  --   --   --   --  0.30 0.44  --   CREATININE 0.84  --   --  0.77  --   --  0.92  CKTOTAL  --   --  247* 187  --   --   --   CKMB  --   --  17.9* 10.3*  --   --   --     Estimated Creatinine Clearance: 58 mL/min (by C-G formula based on Cr of 0.92).  Assessment: 75yo female with history of HTN presented with CP. Patient is noted s/p cath 3VCAD and plans for CABG on 1/19. Heparin was ordered to be 1/16 at [place] Per Dr. Irish Lack at 950 units/hr but was not started due to R arm hematoma above TR band site. Aggrastat was also stopped last night. R wrist hematoma has largely resolved and pharmacy consulted to dose heparin with no bolus.   Heparin level this morning is therapeutic at 0.44 on 1000 units/hr. Hematoma is stable and has not worsened. PLTC low at 146, Hg 11.8.   Goal of Therapy:  Heparin level 0.3-0.7 units/ml Monitor platelets by anticoagulation protocol: Yes   Plan:  -. continue heparin drip at 1000 units/hr (10 ml/hr) - Will continue to monitor for any signs/symptoms of bleeding  - daily HL/CBC,  for CABG Thursday  Eudelia Bunch, Pharm.D. [id number] 08/12/2015 2:03 PM

## 2015-08-12 NOTE — Progress Notes (Signed)
CARDIAC REHAB PHASE I   PRE:  Rate/Rhythm: 77 SR  BP:  Sitting: 118/66        SaO2: 97 RA  MODE:  Ambulation: 550 ft   POST:  Rate/Rhythm: 83 SR  BP:  Sitting: 110/72         SaO2: 100 RA  Pt up in chair, eager to walk. Pt ambulated 550 ft on RA, IV, standby assist, brisk, steady gait, tolerated well. Pt c/o mild DOE, denies CP, dizziness, declined rest stop. Dyspnea improved with rest. Pt appreciate of walk. Pt to bed per pt request after walk, call bell within reach. Will follow.   [id number] [name] Sciara, RN, BSN 08/12/2015 12:25 PM

## 2015-08-12 NOTE — Progress Notes (Signed)
VASCULAR LAB PRELIMINARY  PRELIMINARY  PRELIMINARY  PRELIMINARY  Pre-op Cardiac Surgery  Carotid Findings:  Right ICA stenosis 1-39%, No stenosis identified on left ICA.                                   Vertebral Artery flow is antegrade bilaterally.  Upper Extremity Right Left  Brachial Pressures IV Site 106  Radial Waveforms triphasic triphasic  Ulnar Waveforms triphasic triphasic  Palmar Arch (Allen's Test) normal normal   Findings:  Doppler waveforms remain normal with both radial and ulnar compression at rest bilaterally.    Lower  Extremity Right Left  Dorsalis Pedis triphasic triphasic  Posterior Tibial triphasic triphasic    Findings:  Bilaterally Doppler waveforms are with in normal limits at rest.   Janifer Adie, RVT, RDMS 08/12/2015, 3:56 PM

## 2015-08-12 NOTE — Plan of Care (Signed)
Problem: Phase I Progression Outcomes Goal: Hemodynamically stable Outcome: Progressing Hemodynamically stable.  Goal: Anginal pain relieved Outcome: Progressing Denied any chest pain or chest discomfort.    Problem: Phase III Progression Outcomes Goal: Vascular site scale level 0 - I Vascular Site Scale Level 0: No bruising/bleeding/hematoma Level I (Mild): Bruising/Ecchymosis, minimal bleeding/ooozing, palpable hematoma < 3 cm Level II (Moderate): Bleeding not affecting hemodynamic parameters, pseudoaneurysm, palpable hematoma > 3 cm Level III (Severe) Bleeding which affects hemodynamic parameters or retroperitoneal hemorrhage  Outcome: Progressing Right radial hematoma site is about same (level 1) and has not worsened. Distal pulses are good. Heparin drip changed from 9.5 ml to 10 ml per order.

## 2015-08-12 NOTE — Progress Notes (Signed)
SUBJECTIVE:  Denies any  Chest pain  OBJECTIVE:   Vitals:   Filed Vitals:   08/12/15 0600 08/12/15 0700 08/12/15 0842 08/12/15 0900  BP: 113/69 110/61  117/58  Pulse:    81  Temp:   98.3 F (36.8 C)   TempSrc:   Oral   Resp: 16 17  23   Height:      Weight:      SpO2:    100%   I&O's:   Intake/Output Summary (Last 24 hours) at 08/12/15 1105 Last data filed at 08/12/15 0800  Gross per 24 hour  Intake 584.42 ml  Output    300 ml  Net 284.42 ml   TELEMETRY: Reviewed telemetry pt in NSR:     PHYSICAL EXAM General: Well developed, well nourished, in no acute distress Head: Eyes PERRLA, No xanthomas.   Normal cephalic and atramatic  Lungs:   Clear bilaterally to auscultation and percussion. Heart:   HRRR S1 S2 Pulses are 2+ & equal. Abdomen: Bowel sounds are positive, abdomen soft and non-tender without masses  Extremities:   No clubbing, cyanosis or edema.  DP +1 Neuro: Alert and oriented X 3. Psych:  Good affect, responds appropriately   LABS: Basic Metabolic Panel:  Recent Labs  08/10/15 1530 08/10/15 2017 08/11/15 0611  NA 140  --  139  K 3.6  --  3.4*  CL 103  --  109  CO2 22  --  24  GLUCOSE 105*  --  112*  BUN 14  --  12  CREATININE 0.84  --  0.77  CALCIUM 10.1  --  9.1  MG  --  2.1  --    Liver Function Tests:  Recent Labs  08/11/15 0611  AST 31  ALT 17  ALKPHOS 55  BILITOT 0.9  PROT 6.3*  ALBUMIN 3.5   No results for input(s): LIPASE, AMYLASE in the last 72 hours. CBC:  Recent Labs  08/11/15 0611 08/12/15 0249  WBC 4.1 4.5  HGB 12.3 11.8*  HCT 38.2 37.0  MCV 87.4 88.9  PLT 158 146*   Cardiac Enzymes:  Recent Labs  08/10/15 2331 08/11/15 0611  CKTOTAL 247* 187  CKMB 17.9* 10.3*   BNP: Invalid input(s): POCBNP D-Dimer: No results for input(s): DDIMER in the last 72 hours. Hemoglobin A1C:  Recent Labs  08/11/15 0611  HGBA1C 6.1*   Fasting Lipid Panel:  Recent Labs  08/11/15 0611  CHOL 157  HDL 51  LDLCALC  91  TRIG 73  CHOLHDL 3.1   Thyroid Function Tests:  Recent Labs  08/11/15 0611  TSH 2.467   Anemia Panel: No results for input(s): VITAMINB12, FOLATE, FERRITIN, TIBC, IRON, RETICCTPCT in the last 72 hours. Coag Panel:   Lab Results  Component Value Date   INR 1.15 08/11/2015   INR 1.21 08/10/2015    RADIOLOGY: Dg Chest 2 View  08/10/2015  CLINICAL DATA:  Abnormal EKG. Chest pressure beginning yesterday afternoon while walking dog. EXAM: CHEST  2 VIEW COMPARISON:  None. FINDINGS: Lungs are adequately inflated without consolidation or effusion. Cardiomediastinal silhouette is within normal. There is mild degenerative change of the spine. IMPRESSION: No active cardiopulmonary disease. Electronically Signed   By: Marin Olp M.D.   On: 08/10/2015 16:03    ASSESSMENT/PLAN:  1. ACUTE INFERIOR WALL MYOCARDIAL INFARCTION .  Continue IV Heparin gtt/ASA/BB/statin 2. Hypertension - controlled - continue amlodipine/ARB/BB 3. ASCAD with cath showing 90% ramus, 75% ostial to prox LAD, RPDA 80% and then  occluded (culprit lesion) s/p angioplasty to occluded portion of PDA. Plan for CABG on Thursday due to complex disease in left system. Continue ASA. No oral antiplatelet therapy due to need for CABG. Continue BB and high dose statin.  4. Hypokalemia - repleted.  Repeat BMET.   Sueanne Margarita, MD  08/12/2015  11:05 AM

## 2015-08-12 NOTE — Progress Notes (Addendum)
2 Days Post-Op Procedure(s) (LRB): Left Heart Cath and Coronary Angiography (N/A) Coronary Balloon Angioplasty Subjective: No angina, troponins trending down Echo with mild infero-basal hypokin Plan CABG thurs 1-19 Hematoma right wrist - aggrastat stopped, on iv heparin  Objective: Vital signs in last 24 hours: Temp:  [97.6 F (36.4 C)-98.6 F (37 C)] 98.6 F (37 C) (01/17 1313) Pulse Rate:  [65-82] 80 (01/17 1313) Cardiac Rhythm:  [-] Normal sinus rhythm (01/17 1046) Resp:  [12-23] 19 (01/17 1313) BP: (90-123)/(53-81) 123/53 mmHg (01/17 1313) SpO2:  [95 %-100 %] 100 % (01/17 1313) Weight:  [177 lb (80.287 kg)] 177 lb (80.287 kg) (01/17 0430)  Hemodynamic parameters for last 24 hours:  stable  Intake/Output from previous day: 01/16 0701 - 01/17 0700 In: 837.4 [P.O.:720; I.V.:117.4] Out: 300 [Urine:300] Intake/Output this shift: Total I/O In: 10 [I.V.:10] Out: -     Lab Results:  Recent Labs  08/11/15 0611 08/12/15 0249  WBC 4.1 4.5  HGB 12.3 11.8*  HCT 38.2 37.0  PLT 158 146*   BMET:  Recent Labs  08/11/15 0611 08/12/15 1241  NA 139 143  K 3.4* 4.2  CL 109 109  CO2 24 26  GLUCOSE 112* 89  BUN 12 17  CREATININE 0.77 0.92  CALCIUM 9.1 9.4    PT/INR:  Recent Labs  08/11/15 0611  LABPROT 14.8  INR 1.15   ABG No results found for: PHART, HCO3, TCO2, ACIDBASEDEF, O2SAT CBG (last 3)   Recent Labs  08/10/15 1453  GLUCAP 101*    Assessment/Plan: S/P Procedure(s) (LRB): Left Heart Cath and Coronary Angiography (N/A) Coronary Balloon Angioplasty CABG set for 1-19 am   LOS: 2 days    [name] [name] 08/12/2015

## 2015-08-13 ENCOUNTER — Encounter (HOSPITAL_COMMUNITY): Payer: Self-pay | Admitting: Certified Registered Nurse Anesthetist

## 2015-08-13 LAB — CBC
HCT: 36.6 % (ref 36.0–46.0)
Hemoglobin: 11.7 g/dL — ABNORMAL LOW (ref 12.0–15.0)
MCH: 28.5 pg (ref 26.0–34.0)
MCHC: 32 g/dL (ref 30.0–36.0)
MCV: 89.3 fL (ref 78.0–100.0)
Platelets: 147 10*3/uL — ABNORMAL LOW (ref 150–400)
RBC: 4.1 MIL/uL (ref 3.87–5.11)
RDW: 13.7 % (ref 11.5–15.5)
WBC: 4.6 10*3/uL (ref 4.0–10.5)

## 2015-08-13 LAB — ABO/RH: ABO/RH(D): O POS

## 2015-08-13 LAB — PREPARE RBC (CROSSMATCH)

## 2015-08-13 LAB — HEPARIN LEVEL (UNFRACTIONATED): HEPARIN UNFRACTIONATED: 0.47 [IU]/mL (ref 0.30–0.70)

## 2015-08-13 MED ORDER — EPINEPHRINE HCL 1 MG/ML IJ SOLN
0.0000 ug/min | INTRAVENOUS | Status: AC
  Administered 2015-08-14: 3 ug/min via INTRAVENOUS
  Filled 2015-08-13: qty 4

## 2015-08-13 MED ORDER — CHLORHEXIDINE GLUCONATE 4 % EX LIQD
60.0000 mL | Freq: Once | CUTANEOUS | Status: AC
  Administered 2015-08-13: 4 via TOPICAL
  Filled 2015-08-13: qty 60

## 2015-08-13 MED ORDER — DEXTROSE 5 % IV SOLN
750.0000 mg | INTRAVENOUS | Status: DC
  Filled 2015-08-13: qty 750

## 2015-08-13 MED ORDER — DEXMEDETOMIDINE HCL IN NACL 400 MCG/100ML IV SOLN
0.1000 ug/kg/h | INTRAVENOUS | Status: DC
  Filled 2015-08-13: qty 100

## 2015-08-13 MED ORDER — POTASSIUM CHLORIDE 2 MEQ/ML IV SOLN
80.0000 meq | INTRAVENOUS | Status: DC
  Filled 2015-08-13: qty 40

## 2015-08-13 MED ORDER — CHLORHEXIDINE GLUCONATE 4 % EX LIQD
60.0000 mL | Freq: Once | CUTANEOUS | Status: AC
  Administered 2015-08-14: 4 via TOPICAL

## 2015-08-13 MED ORDER — VANCOMYCIN HCL 10 G IV SOLR
1250.0000 mg | INTRAVENOUS | Status: AC
Start: 2015-08-14 — End: 2015-08-14
  Administered 2015-08-14: 1250 mg via INTRAVENOUS
  Filled 2015-08-13 (×2): qty 1250

## 2015-08-13 MED ORDER — CEFUROXIME SODIUM 1.5 G IJ SOLR
1.5000 g | INTRAMUSCULAR | Status: AC
  Administered 2015-08-14: .75 g via INTRAVENOUS
  Administered 2015-08-14: 1.5 g via INTRAVENOUS
  Filled 2015-08-13 (×2): qty 1.5

## 2015-08-13 MED ORDER — SODIUM CHLORIDE 0.9 % IV SOLN
INTRAVENOUS | Status: DC
  Filled 2015-08-13: qty 40

## 2015-08-13 MED ORDER — PLASMA-LYTE 148 IV SOLN
INTRAVENOUS | Status: AC
  Administered 2015-08-14: 500 mL
  Filled 2015-08-13: qty 2.5

## 2015-08-13 MED ORDER — SODIUM CHLORIDE 0.9 % IV SOLN
INTRAVENOUS | Status: DC
  Filled 2015-08-13: qty 30

## 2015-08-13 MED ORDER — BISACODYL 5 MG PO TBEC
5.0000 mg | DELAYED_RELEASE_TABLET | Freq: Once | ORAL | Status: DC
  Filled 2015-08-13: qty 1

## 2015-08-13 MED ORDER — METOPROLOL TARTRATE 12.5 MG HALF TABLET
12.5000 mg | ORAL_TABLET | Freq: Once | ORAL | Status: AC
  Administered 2015-08-14: 12.5 mg via ORAL
  Filled 2015-08-13: qty 1

## 2015-08-13 MED ORDER — SODIUM CHLORIDE 0.9 % IV SOLN
INTRAVENOUS | Status: DC
  Filled 2015-08-13: qty 2.5

## 2015-08-13 MED ORDER — NITROGLYCERIN IN D5W 200-5 MCG/ML-% IV SOLN
2.0000 ug/min | INTRAVENOUS | Status: AC
  Administered 2015-08-14: 5 ug/min via INTRAVENOUS
  Filled 2015-08-13: qty 250

## 2015-08-13 MED ORDER — TEMAZEPAM 15 MG PO CAPS: 15.0000 mg | ORAL_CAPSULE | Freq: Once | ORAL | Status: DC | PRN

## 2015-08-13 MED ORDER — MAGNESIUM SULFATE 50 % IJ SOLN
40.0000 meq | INTRAMUSCULAR | Status: DC
  Filled 2015-08-13: qty 10

## 2015-08-13 MED ORDER — DOPAMINE-DEXTROSE 3.2-5 MG/ML-% IV SOLN
0.0000 ug/kg/min | INTRAVENOUS | Status: AC
  Administered 2015-08-14: 3 ug/kg/min via INTRAVENOUS
  Filled 2015-08-13: qty 250

## 2015-08-13 MED ORDER — CHLORHEXIDINE GLUCONATE 0.12 % MT SOLN
15.0000 mL | Freq: Once | OROMUCOSAL | Status: AC
  Administered 2015-08-14: 15 mL via OROMUCOSAL
  Filled 2015-08-13: qty 15

## 2015-08-13 MED ORDER — PHENYLEPHRINE HCL 10 MG/ML IJ SOLN
30.0000 ug/min | INTRAVENOUS | Status: DC
  Filled 2015-08-13: qty 2

## 2015-08-13 NOTE — Care Management Important Message (Signed)
Important Message  Patient Details  Name: [name] [name] MRN: [id number] Date of Birth: [birth year]   Medicare Important Message Given:  Yes    Nathen May 08/13/2015, 10:55 AM

## 2015-08-13 NOTE — Anesthesia Preprocedure Evaluation (Addendum)
Anesthesia Evaluation  Patient identified by MRN, date of birth, ID band Patient awake    Reviewed: Allergy & Precautions, NPO status , Patient's Chart, lab work & pertinent test results  History of Anesthesia Complications Negative for: history of anesthetic complications  Airway Mallampati: II  TM Distance: <3 FB Neck ROM: Full    Dental  (+) Teeth Intact   Pulmonary neg pulmonary ROS,    breath sounds clear to auscultation       Cardiovascular hypertension, Pt. on medications + angina + CAD (severe 3v ASCADz) and + Past MI  (-) CHF (-) dysrhythmias  Rhythm:Regular Rate:Normal  1/17 Echo: EF 50-55%, mild inf hypokinesis, mild AI, mild TR   Neuro/Psych negative neurological ROS  negative psych ROS   GI/Hepatic negative GI ROS, Neg liver ROS, hiatal hernia, GERD  ,Small hiatal hernia with occasional gerd   Endo/Other  negative endocrine ROS  Renal/GU negative Renal ROS     Musculoskeletal   Abdominal   Peds  Hematology negative hematology ROS (+)   Anesthesia Other Findings   Reproductive/Obstetrics                           Anesthesia Physical Anesthesia Plan  ASA: III  Anesthesia Plan: General   Post-op Pain Management:    Induction: Intravenous  Airway Management Planned: Oral ETT  Additional Equipment: Arterial line, CVP, PA Cath, Ultrasound Guidance Line Placement and TEE  Intra-op Plan:   Post-operative Plan: Post-operative intubation/ventilation  Informed Consent: I have reviewed the patients History and Physical, chart, labs and discussed the procedure including the risks, benefits and alternatives for the proposed anesthesia with the patient or authorized representative who has indicated his/her understanding and acceptance.   Dental advisory given  Plan Discussed with: CRNA and Surgeon  Anesthesia Plan Comments: (Plan routine monitors, A line, PA cath, GETA with  TEE and post op ventilation)       Anesthesia Quick Evaluation

## 2015-08-13 NOTE — Progress Notes (Signed)
CARDIAC REHAB PHASE I   PRE:  Rate/Rhythm: 73 SR  BP:  Sitting: 135/80       SaO2: 100 RA  MODE:  Ambulation:  550 ft   POST:  Rate/Rhythm: 86 SR  BP:  Sitting: 132/65         SaO2: 100 RA  Pt ambulated 550 ft on RA, IV, stand by assist, brisk, steady gait, tolerated well. Pt c/o mild DOE, has been regular with activity, denies cp, dizziness, declined rest stop. Pt states she is ready for surgery tomorrow, in good spirits, appreciative of walk. Pt to recliner after walk, call bell within reach. Will follow post-op.   [id number] Lenna Sciara, RN, BSN 08/13/2015 11:36 AM

## 2015-08-13 NOTE — Progress Notes (Signed)
ANTICOAGULATION CONSULT NOTE - Follow Up Consult  Pharmacy Consult for Heparin Indication: Severe CAD awaiting CABG 1/19  Allergies  Allergen Reactions  . Other Hives and Itching    Food Dye Red Dye #40  . Peanut-Containing Drug Products Rash    Mild rash    Patient Measurements: Height: 5\' 10"  (177.8 cm) Weight: 177 lb (80.287 kg) IBW/kg (Calculated) : 68.5 Heparin Dosing Weight: 78 kg  Vital Signs: Temp: 98.1 F (36.7 C) (01/18 0412) Temp Source: Oral (01/18 0412) BP: 125/67 mmHg (01/18 0412) Pulse Rate: 66 (01/18 0412)  Labs:  Recent Labs  08/10/15 1530 08/10/15 2017 08/10/15 2331 08/11/15 [id number] 08/11/15 2202 08/12/15 0249 08/12/15 1241 08/13/15 0353  HGB 14.4 13.0  --  12.3  --  11.8*  --  11.7*  HCT 43.8 40.2  --  38.2  --  37.0  --  36.6  PLT 182 178 167 158  --  146*  --  147*  LABPROT  --  15.4*  --  14.8  --   --   --   --   INR  --  1.21  --  1.15  --   --   --   --   HEPARINUNFRC  --   --   --   --  0.30 0.44  --  0.47  CREATININE 0.84  --   --  0.77  --   --  0.92  --   CKTOTAL  --   --  247* 187  --   --   --   --   CKMB  --   --  17.9* 10.3*  --   --   --   --     Estimated Creatinine Clearance: 58 mL/min (by C-G formula based on Cr of 0.92).  Assessment: 75yo female with history of HTN presented with CP. Patient is noted s/p cath 3VCAD and plans for CABG on 1/19. Heparin drip restarted on 1/16 post cardiac cath and after improvement of R wrist hematoma.  ASCAD with cath showing 90% ramus, 75% ostial to prox LAD, RPDA 80% and then occluded (culprit lesion) s/p angioplasty to occluded portion of PDA. Plan for CABG on Thursday 1/19.  POD #3 post cardiac cath, the heparin level this AM remains therapeutic at 0.47 on 1000 units/hr.  R wrist Hematoma noted to be stable , looks better per RN today.  PLTC low/stable at 147, Hg 11.7 stable.   Goal of Therapy:  Heparin level 0.3-0.7 units/ml Monitor platelets by anticoagulation protocol: Yes   Plan:   -. continue heparin drip at 1000 units/hr (10 ml/hr) - Will continue to monitor for any signs/symptoms of bleeding  - daily HL/CBC, for CABG Thursday 1/19.   Nicole Cella, RPh Clinical Pharmacist Pager: [phone] 08/13/2015 10:39 AM

## 2015-08-13 NOTE — Progress Notes (Signed)
Patient has previously read entire surgery book - is able to quote certain pages/ sections - following cardiac cath on 08/10/15 during stay on 2S. Watched #112 Video - preparation heart surgery.

## 2015-08-13 NOTE — Progress Notes (Signed)
SUBJECTIVE:  No complaints  OBJECTIVE:   Vitals:   Filed Vitals:   08/12/15 0900 08/12/15 1313 08/12/15 2007 08/13/15 0412  BP: 117/58 123/53 113/53 125/67  Pulse: 81 80 78 66  Temp:  98.6 F (37 C) 98.2 F (36.8 C) 98.1 F (36.7 C)  TempSrc:  Oral Oral Oral  Resp: 23 19 18 18   Height:      Weight:      SpO2: 100% 100% 100% 99%   I&O's:  No intake or output data in the 24 hours ending 08/13/15 0832 TELEMETRY: Reviewed telemetry pt in NSR:     PHYSICAL EXAM General: Well developed, well nourished, in no acute distress Head: Eyes PERRLA, No xanthomas.   Normal cephalic and atramatic  Lungs:   Clear bilaterally to auscultation and percussion. Heart:   HRRR S1 S2 Pulses are 2+ & equal. Abdomen: Bowel sounds are positive, abdomen soft and non-tender without masses Extremities:   No clubbing, cyanosis or edema.  DP +1 Neuro: Alert and oriented X 3. Psych:  Good affect, responds appropriately   LABS: Basic Metabolic Panel:  Recent Labs  08/10/15 2017 08/11/15 0611 08/12/15 1241  NA  --  139 143  K  --  3.4* 4.2  CL  --  109 109  CO2  --  24 26  GLUCOSE  --  112* 89  BUN  --  12 17  CREATININE  --  0.77 0.92  CALCIUM  --  9.1 9.4  MG 2.1  --   --    Liver Function Tests:  Recent Labs  08/11/15 0611  AST 31  ALT 17  ALKPHOS 55  BILITOT 0.9  PROT 6.3*  ALBUMIN 3.5   No results for input(s): LIPASE, AMYLASE in the last 72 hours. CBC:  Recent Labs  08/12/15 0249 08/13/15 0353  WBC 4.5 4.6  HGB 11.8* 11.7*  HCT 37.0 36.6  MCV 88.9 89.3  PLT 146* 147*   Cardiac Enzymes:  Recent Labs  08/10/15 2331 08/11/15 0611  CKTOTAL 247* 187  CKMB 17.9* 10.3*   BNP: Invalid input(s): POCBNP D-Dimer: No results for input(s): DDIMER in the last 72 hours. Hemoglobin A1C:  Recent Labs  08/11/15 0611  HGBA1C 6.1*   Fasting Lipid Panel:  Recent Labs  08/11/15 0611  CHOL 157  HDL 51  LDLCALC 91  TRIG 73  CHOLHDL 3.1   Thyroid Function  Tests:  Recent Labs  08/11/15 0611  TSH 2.467   Anemia Panel: No results for input(s): VITAMINB12, FOLATE, FERRITIN, TIBC, IRON, RETICCTPCT in the last 72 hours. Coag Panel:   Lab Results  Component Value Date   INR 1.15 08/11/2015   INR 1.21 08/10/2015    RADIOLOGY: Dg Chest 2 View  08/10/2015  CLINICAL DATA:  Abnormal EKG. Chest pressure beginning yesterday afternoon while walking dog. EXAM: CHEST  2 VIEW COMPARISON:  None. FINDINGS: Lungs are adequately inflated without consolidation or effusion. Cardiomediastinal silhouette is within normal. There is mild degenerative change of the spine. IMPRESSION: No active cardiopulmonary disease. Electronically Signed   By: Marin Olp M.D.   On: 08/10/2015 16:03   ASSESSMENT/PLAN:  1. ACUTE INFERIOR WALL MYOCARDIAL INFARCTION . Continue IV Heparin gtt/ASA/BB/statin 2. Hypertension - controlled - continue amlodipine/ARB/BB 3. ASCAD with cath showing 90% ramus, 75% ostial to prox LAD, RPDA 80% and then occluded (culprit lesion) s/p angioplasty to occluded portion of PDA. Plan for CABG on Thursday due to complex disease in left system. Continue ASA. No  oral antiplatelet therapy due to need for CABG. Continue BB and high dose statin.  4. Hypokalemia - repleted.      Sueanne Margarita, MD  08/13/2015  8:32 AM

## 2015-08-13 NOTE — Progress Notes (Signed)
Signed consent for blood transfusion. Stated she had questions and wanted to talk with Dr. Prescott Gum before signing consent for surgery.

## 2015-08-14 ENCOUNTER — Inpatient Hospital Stay (HOSPITAL_COMMUNITY)
Admit: 2015-08-14 | Discharge: 2015-08-14 | Disposition: A | Discharge status: Home / Self Care | Payer: Medicare PPO | Attending: Cardiothoracic Surgery | Admitting: Cardiothoracic Surgery

## 2015-08-14 ENCOUNTER — Encounter (HOSPITAL_COMMUNITY)
Admission: EM | Disposition: A | Discharge status: Home Health | Payer: Medicare PPO | Source: Home / Self Care | Attending: Cardiothoracic Surgery

## 2015-08-14 ENCOUNTER — Inpatient Hospital Stay (HOSPITAL_COMMUNITY): Payer: Medicare PPO

## 2015-08-14 ENCOUNTER — Inpatient Hospital Stay (HOSPITAL_COMMUNITY): Payer: Medicare PPO | Admitting: Anesthesiology

## 2015-08-14 DIAGNOSIS — Z951 Presence of aortocoronary bypass graft: Secondary | ICD-10-CM

## 2015-08-14 DIAGNOSIS — I25110 Atherosclerotic heart disease of native coronary artery with unstable angina pectoris: Secondary | ICD-10-CM

## 2015-08-14 HISTORY — PX: TEE WITHOUT CARDIOVERSION: SHX5443

## 2015-08-14 HISTORY — PX: CORONARY ARTERY BYPASS GRAFT: SHX141

## 2015-08-14 LAB — CBC
HCT: 35.2 % — ABNORMAL LOW (ref 36.0–46.0)
HEMATOCRIT: 36.8 % (ref 36.0–46.0)
HEMATOCRIT: 35.8 % — AB (ref 36.0–46.0)
HEMOGLOBIN: 11.7 g/dL — AB (ref 12.0–15.0)
HEMOGLOBIN: 12.3 g/dL (ref 12.0–15.0)
Hemoglobin: 11.9 g/dL — ABNORMAL LOW (ref 12.0–15.0)
MCH: 28.2 pg (ref 26.0–34.0)
MCH: 29 pg (ref 26.0–34.0)
MCH: 28.4 pg (ref 26.0–34.0)
MCHC: 31.8 g/dL (ref 30.0–36.0)
MCHC: 34.4 g/dL (ref 30.0–36.0)
MCHC: 33.8 g/dL (ref 30.0–36.0)
MCV: 88.7 fL (ref 78.0–100.0)
MCV: 84.4 fL (ref 78.0–100.0)
MCV: 84 fL (ref 78.0–100.0)
Platelets: 152 10*3/uL (ref 150–400)
Platelets: 75 10*3/uL — ABNORMAL LOW (ref 150–400)
Platelets: 87 10*3/uL — ABNORMAL LOW (ref 150–400)
RBC: 4.15 MIL/uL (ref 3.87–5.11)
RBC: 4.24 MIL/uL (ref 3.87–5.11)
RBC: 4.19 MIL/uL (ref 3.87–5.11)
RDW: 13.5 % (ref 11.5–15.5)
RDW: 14.4 % (ref 11.5–15.5)
RDW: 14.5 % (ref 11.5–15.5)
WBC: 4.4 10*3/uL (ref 4.0–10.5)
WBC: 13.1 10*3/uL — ABNORMAL HIGH (ref 4.0–10.5)
WBC: 9.1 10*3/uL (ref 4.0–10.5)

## 2015-08-14 LAB — POCT I-STAT 3, VENOUS BLOOD GAS (G3P V)
Acid-Base Excess: 1 mmol/L (ref 0.0–2.0)
Bicarbonate: 25.1 mEq/L — ABNORMAL HIGH (ref 20.0–24.0)
O2 Saturation: 73 %
PCO2 VEN: 32.7 mmHg — AB (ref 45.0–50.0)
PH VEN: 7.477 — AB (ref 7.250–7.300)
PO2 VEN: 29 mmHg — AB (ref 30.0–45.0)
Patient temperature: 33
TCO2: 26 mmol/L (ref 0–100)

## 2015-08-14 LAB — APTT: aPTT: 30 seconds (ref 24–37)

## 2015-08-14 LAB — POCT I-STAT 4, (NA,K, GLUC, HGB,HCT)
GLUCOSE: 114 mg/dL — AB (ref 65–99)
HCT: 36 % (ref 36.0–46.0)
HEMOGLOBIN: 12.2 g/dL (ref 12.0–15.0)
POTASSIUM: 3.3 mmol/L — AB (ref 3.5–5.1)
Sodium: 144 mmol/L (ref 135–145)

## 2015-08-14 LAB — POCT I-STAT, CHEM 8
BUN: 12 mg/dL (ref 6–20)
BUN: 12 mg/dL (ref 6–20)
BUN: 11 mg/dL (ref 6–20)
BUN: 8 mg/dL (ref 6–20)
BUN: 9 mg/dL (ref 6–20)
BUN: 8 mg/dL (ref 6–20)
BUN: 7 mg/dL (ref 6–20)
BUN: 10 mg/dL (ref 6–20)
CALCIUM ION: 0.8 mmol/L — AB (ref 1.13–1.30)
CALCIUM ION: 0.79 mmol/L — AB (ref 1.13–1.30)
CALCIUM ION: 0.81 mmol/L — AB (ref 1.13–1.30)
CALCIUM ION: 0.78 mmol/L — AB (ref 1.13–1.30)
CALCIUM ION: 0.97 mmol/L — AB (ref 1.13–1.30)
CHLORIDE: 107 mmol/L (ref 101–111)
CHLORIDE: 101 mmol/L (ref 101–111)
CHLORIDE: 98 mmol/L — AB (ref 101–111)
CHLORIDE: 97 mmol/L — AB (ref 101–111)
CREATININE: 0.5 mg/dL (ref 0.44–1.00)
CREATININE: 0.5 mg/dL (ref 0.44–1.00)
CREATININE: 0.6 mg/dL (ref 0.44–1.00)
Calcium, Ion: 1.28 mmol/L (ref 1.13–1.30)
Calcium, Ion: 1.15 mmol/L (ref 1.13–1.30)
Calcium, Ion: 0.84 mmol/L — ABNORMAL LOW (ref 1.13–1.30)
Chloride: 106 mmol/L (ref 101–111)
Chloride: 108 mmol/L (ref 101–111)
Chloride: 100 mmol/L — ABNORMAL LOW (ref 101–111)
Chloride: 105 mmol/L (ref 101–111)
Creatinine, Ser: 0.5 mg/dL (ref 0.44–1.00)
Creatinine, Ser: 0.6 mg/dL (ref 0.44–1.00)
Creatinine, Ser: 0.5 mg/dL (ref 0.44–1.00)
Creatinine, Ser: 0.4 mg/dL — ABNORMAL LOW (ref 0.44–1.00)
Creatinine, Ser: 0.5 mg/dL (ref 0.44–1.00)
GLUCOSE: 122 mg/dL — AB (ref 65–99)
GLUCOSE: 151 mg/dL — AB (ref 65–99)
GLUCOSE: 182 mg/dL — AB (ref 65–99)
GLUCOSE: 153 mg/dL — AB (ref 65–99)
Glucose, Bld: 441 mg/dL — ABNORMAL HIGH (ref 65–99)
Glucose, Bld: 307 mg/dL — ABNORMAL HIGH (ref 65–99)
Glucose, Bld: 147 mg/dL — ABNORMAL HIGH (ref 65–99)
Glucose, Bld: 126 mg/dL — ABNORMAL HIGH (ref 65–99)
HCT: 31 % — ABNORMAL LOW (ref 36.0–46.0)
HCT: 25 % — ABNORMAL LOW (ref 36.0–46.0)
HCT: 26 % — ABNORMAL LOW (ref 36.0–46.0)
HCT: 35 % — ABNORMAL LOW (ref 36.0–46.0)
HEMATOCRIT: 32 % — AB (ref 36.0–46.0)
HEMATOCRIT: 39 % (ref 36.0–46.0)
HEMATOCRIT: 27 % — AB (ref 36.0–46.0)
HEMATOCRIT: 30 % — AB (ref 36.0–46.0)
HEMOGLOBIN: 10.9 g/dL — AB (ref 12.0–15.0)
HEMOGLOBIN: 10.5 g/dL — AB (ref 12.0–15.0)
HEMOGLOBIN: 13.3 g/dL (ref 12.0–15.0)
HEMOGLOBIN: 9.2 g/dL — AB (ref 12.0–15.0)
Hemoglobin: 8.5 g/dL — ABNORMAL LOW (ref 12.0–15.0)
Hemoglobin: 8.8 g/dL — ABNORMAL LOW (ref 12.0–15.0)
Hemoglobin: 10.2 g/dL — ABNORMAL LOW (ref 12.0–15.0)
Hemoglobin: 11.9 g/dL — ABNORMAL LOW (ref 12.0–15.0)
POTASSIUM: 4.1 mmol/L (ref 3.5–5.1)
POTASSIUM: 5 mmol/L (ref 3.5–5.1)
POTASSIUM: 5.7 mmol/L — AB (ref 3.5–5.1)
Potassium: 5.4 mmol/L — ABNORMAL HIGH (ref 3.5–5.1)
Potassium: 3.8 mmol/L (ref 3.5–5.1)
Potassium: 5 mmol/L (ref 3.5–5.1)
Potassium: 4.2 mmol/L (ref 3.5–5.1)
Potassium: 3.6 mmol/L (ref 3.5–5.1)
SODIUM: 140 mmol/L (ref 135–145)
Sodium: 140 mmol/L (ref 135–145)
Sodium: 138 mmol/L (ref 135–145)
Sodium: 136 mmol/L (ref 135–145)
Sodium: 140 mmol/L (ref 135–145)
Sodium: 138 mmol/L (ref 135–145)
Sodium: 143 mmol/L (ref 135–145)
Sodium: 144 mmol/L (ref 135–145)
TCO2: 24 mmol/L (ref 0–100)
TCO2: 17 mmol/L (ref 0–100)
TCO2: 19 mmol/L (ref 0–100)
TCO2: 25 mmol/L (ref 0–100)
TCO2: 25 mmol/L (ref 0–100)
TCO2: 27 mmol/L (ref 0–100)
TCO2: 26 mmol/L (ref 0–100)
TCO2: 24 mmol/L (ref 0–100)

## 2015-08-14 LAB — POCT I-STAT 3, ART BLOOD GAS (G3+)
ACID-BASE EXCESS: 2 mmol/L (ref 0.0–2.0)
Acid-Base Excess: 1 mmol/L (ref 0.0–2.0)
Acid-Base Excess: 3 mmol/L — ABNORMAL HIGH (ref 0.0–2.0)
BICARBONATE: 25.1 meq/L — AB (ref 20.0–24.0)
BICARBONATE: 26.5 meq/L — AB (ref 20.0–24.0)
Bicarbonate: 27.6 mEq/L — ABNORMAL HIGH (ref 20.0–24.0)
O2 SAT: 100 %
O2 Saturation: 100 %
O2 Saturation: 97 %
PCO2 ART: 30.3 mmHg — AB (ref 35.0–45.0)
PCO2 ART: 45.2 mmHg — AB (ref 35.0–45.0)
PCO2 ART: 33.2 mmHg — AB (ref 35.0–45.0)
PH ART: 7.511 — AB (ref 7.350–7.450)
PO2 ART: 326 mmHg — AB (ref 80.0–100.0)
PO2 ART: 72 mmHg — AB (ref 80.0–100.0)
Patient temperature: 33
Patient temperature: 37.2
Patient temperature: 34.8
TCO2: 26 mmol/L (ref 0–100)
TCO2: 29 mmol/L (ref 0–100)
TCO2: 28 mmol/L (ref 0–100)
pH, Arterial: 7.395 (ref 7.350–7.450)
pH, Arterial: 7.502 — ABNORMAL HIGH (ref 7.350–7.450)
pO2, Arterial: 308 mmHg — ABNORMAL HIGH (ref 80.0–100.0)

## 2015-08-14 LAB — PLATELET COUNT
Platelets: 79 10*3/uL — ABNORMAL LOW (ref 150–400)
Platelets: 25 10*3/uL — CL (ref 150–400)

## 2015-08-14 LAB — PROTIME-INR
INR: 1.42 (ref 0.00–1.49)
Prothrombin Time: 17.5 seconds — ABNORMAL HIGH (ref 11.6–15.2)

## 2015-08-14 LAB — BASIC METABOLIC PANEL
Anion gap: 7 (ref 5–15)
BUN: 15 mg/dL (ref 6–20)
CO2: 25 mmol/L (ref 22–32)
Calcium: 9.3 mg/dL (ref 8.9–10.3)
Chloride: 110 mmol/L (ref 101–111)
Creatinine, Ser: 0.81 mg/dL (ref 0.44–1.00)
GFR calc Af Amer: >60 mL/min (ref >60)
GFR calc non Af Amer: >60 mL/min (ref >60)
Glucose, Bld: 127 mg/dL — ABNORMAL HIGH (ref 65–99)
Potassium: 4 mmol/L (ref 3.5–5.1)
Sodium: 142 mmol/L (ref 135–145)

## 2015-08-14 LAB — CREATININE, SERUM
Creatinine, Ser: 0.79 mg/dL (ref 0.44–1.00)
GFR calc Af Amer: >60 mL/min (ref >60)
GFR calc non Af Amer: >60 mL/min (ref >60)

## 2015-08-14 LAB — PREPARE RBC (CROSSMATCH)

## 2015-08-14 LAB — MAGNESIUM: Magnesium: 2.8 mg/dL — ABNORMAL HIGH (ref 1.7–2.4)

## 2015-08-14 LAB — HEMOGLOBIN AND HEMATOCRIT, BLOOD
HCT: 29.4 % — ABNORMAL LOW (ref 36.0–46.0)
Hemoglobin: 10 g/dL — ABNORMAL LOW (ref 12.0–15.0)

## 2015-08-14 LAB — GLUCOSE, CAPILLARY: GLUCOSE-CAPILLARY: 122 mg/dL — AB (ref 65–99)

## 2015-08-14 LAB — HEPARIN LEVEL (UNFRACTIONATED): Heparin Unfractionated: 0.48 IU/mL (ref 0.30–0.70)

## 2015-08-14 SURGERY — CORONARY ARTERY BYPASS GRAFTING (CABG)
Anesthesia: General | Site: Chest

## 2015-08-14 MED ORDER — POTASSIUM CHLORIDE 10 MEQ/50ML IV SOLN
10.0000 meq | INTRAVENOUS | Status: AC
  Administered 2015-08-14 (×3): 10 meq via INTRAVENOUS

## 2015-08-14 MED ORDER — HEMOSTATIC AGENTS (NO CHARGE) OPTIME
TOPICAL | Status: DC | PRN
  Administered 2015-08-14 (×6): 1 via TOPICAL

## 2015-08-14 MED ORDER — LACTATED RINGERS IV SOLN
INTRAVENOUS | Status: DC | PRN
  Administered 2015-08-14: 07:00:00 via INTRAVENOUS

## 2015-08-14 MED ORDER — FAMOTIDINE IN NACL 20-0.9 MG/50ML-% IV SOLN
20.0000 mg | Freq: Two times a day (BID) | INTRAVENOUS | Status: AC
  Administered 2015-08-14: 20 mg via INTRAVENOUS

## 2015-08-14 MED ORDER — INSULIN REGULAR HUMAN 100 UNIT/ML IJ SOLN
INTRAMUSCULAR | Status: DC
  Administered 2015-08-14: 3.1 [IU]/h via INTRAVENOUS
  Filled 2015-08-14 (×2): qty 2.5

## 2015-08-14 MED ORDER — LACTATED RINGERS IV SOLN
INTRAVENOUS | Status: DC | PRN
  Administered 2015-08-14 (×2): via INTRAVENOUS

## 2015-08-14 MED ORDER — MORPHINE SULFATE (PF) 2 MG/ML IV SOLN
2.0000 mg | INTRAVENOUS | Status: DC | PRN
  Administered 2015-08-15: 2 mg via INTRAVENOUS
  Filled 2015-08-14: qty 1

## 2015-08-14 MED ORDER — SODIUM CHLORIDE 0.9 % IV SOLN
Freq: Once | INTRAVENOUS | Status: AC
  Administered 2015-08-14: 16:00:00 via INTRAVENOUS

## 2015-08-14 MED ORDER — MAGNESIUM SULFATE 4 GM/100ML IV SOLN
4.0000 g | Freq: Once | INTRAVENOUS | Status: AC
  Administered 2015-08-14: 4 g via INTRAVENOUS
  Filled 2015-08-14: qty 100

## 2015-08-14 MED ORDER — PHENYLEPHRINE HCL 10 MG/ML IJ SOLN
10.0000 mg | INTRAVENOUS | Status: DC | PRN
  Administered 2015-08-14: 20 ug/min via INTRAVENOUS

## 2015-08-14 MED ORDER — CHLORHEXIDINE GLUCONATE 4 % EX LIQD
CUTANEOUS | Status: AC
  Administered 2015-08-14: 05:00:00
  Filled 2015-08-14: qty 15

## 2015-08-14 MED ORDER — ASPIRIN EC 325 MG PO TBEC
325.0000 mg | DELAYED_RELEASE_TABLET | Freq: Every day | ORAL | Status: DC
  Administered 2015-08-15 – 2015-08-22 (×8): 325 mg via ORAL
  Filled 2015-08-14 (×8): qty 1

## 2015-08-14 MED ORDER — SODIUM CHLORIDE 0.9 % IV SOLN: Freq: Once | INTRAVENOUS | Status: DC

## 2015-08-14 MED ORDER — PROTAMINE SULFATE 10 MG/ML IV SOLN
INTRAVENOUS | Status: DC | PRN
  Administered 2015-08-14: 40 mg via INTRAVENOUS
  Administered 2015-08-14: 90 mg via INTRAVENOUS
  Administered 2015-08-14 (×2): 50 mg via INTRAVENOUS
  Administered 2015-08-14 (×2): 30 mg via INTRAVENOUS
  Administered 2015-08-14: 50 mg via INTRAVENOUS

## 2015-08-14 MED ORDER — SODIUM CHLORIDE 0.9 % IV SOLN
250.0000 mL | INTRAVENOUS | Status: DC
  Administered 2015-08-15: 250 mL via INTRAVENOUS

## 2015-08-14 MED ORDER — DOPAMINE-DEXTROSE 3.2-5 MG/ML-% IV SOLN: 0.0000 ug/kg/min | INTRAVENOUS | Status: DC

## 2015-08-14 MED ORDER — METOPROLOL TARTRATE 1 MG/ML IV SOLN: 2.5000 mg | INTRAVENOUS | Status: DC | PRN

## 2015-08-14 MED ORDER — VANCOMYCIN HCL IN DEXTROSE 1-5 GM/200ML-% IV SOLN
1000.0000 mg | Freq: Once | INTRAVENOUS | Status: AC
  Administered 2015-08-14: 1000 mg via INTRAVENOUS
  Filled 2015-08-14: qty 200

## 2015-08-14 MED ORDER — DEXTROSE 5 % IV SOLN
0.0000 ug/min | INTRAVENOUS | Status: DC
  Administered 2015-08-15: 15 ug/min via INTRAVENOUS
  Filled 2015-08-14 (×2): qty 2

## 2015-08-14 MED ORDER — ROCURONIUM BROMIDE 50 MG/5ML IV SOLN
INTRAVENOUS | Status: AC
  Filled 2015-08-14: qty 1

## 2015-08-14 MED ORDER — ASPIRIN 81 MG PO CHEW
324.0000 mg | CHEWABLE_TABLET | Freq: Every day | ORAL | Status: DC
  Filled 2015-08-14: qty 4

## 2015-08-14 MED ORDER — MILRINONE IN DEXTROSE 20 MG/100ML IV SOLN
0.3750 ug/kg/min | INTRAVENOUS | Status: AC
  Administered 2015-08-14: 0.25 ug/kg/min via INTRAVENOUS
  Filled 2015-08-14: qty 100

## 2015-08-14 MED ORDER — CALCIUM CHLORIDE 10 % IV SOLN
INTRAVENOUS | Status: DC | PRN
  Administered 2015-08-14: 300 mg via INTRAVENOUS
  Administered 2015-08-14: 200 mg via INTRAVENOUS
  Administered 2015-08-14: 300 mg via INTRAVENOUS

## 2015-08-14 MED ORDER — 0.9 % SODIUM CHLORIDE (POUR BTL) OPTIME
TOPICAL | Status: DC | PRN
  Administered 2015-08-14: 1000 mL
  Administered 2015-08-14: 5000 mL
  Administered 2015-08-14: 1000 mL

## 2015-08-14 MED ORDER — SODIUM CHLORIDE 0.9 % IJ SOLN: 3.0000 mL | INTRAMUSCULAR | Status: DC | PRN

## 2015-08-14 MED ORDER — NITROGLYCERIN IN D5W 200-5 MCG/ML-% IV SOLN: 0.0000 ug/min | INTRAVENOUS | Status: DC

## 2015-08-14 MED ORDER — HEPARIN SODIUM (PORCINE) 1000 UNIT/ML IJ SOLN
INTRAMUSCULAR | Status: AC
  Filled 2015-08-14: qty 1

## 2015-08-14 MED ORDER — EPHEDRINE SULFATE 50 MG/ML IJ SOLN
INTRAMUSCULAR | Status: DC | PRN
  Administered 2015-08-14: 25 mg via INTRAVENOUS

## 2015-08-14 MED ORDER — CHLORHEXIDINE GLUCONATE 0.12 % MT SOLN
15.0000 mL | OROMUCOSAL | Status: DC
  Administered 2015-08-14: 15 mL via OROMUCOSAL

## 2015-08-14 MED ORDER — FUROSEMIDE 10 MG/ML IJ SOLN
INTRAMUSCULAR | Status: AC
  Filled 2015-08-14: qty 2

## 2015-08-14 MED ORDER — STERILE WATER FOR INJECTION IJ SOLN
INTRAMUSCULAR | Status: AC
  Filled 2015-08-14: qty 20

## 2015-08-14 MED ORDER — SODIUM CHLORIDE 0.9 % IV SOLN: INTRAVENOUS | Status: DC

## 2015-08-14 MED ORDER — SODIUM CHLORIDE 0.9 % IV SOLN
250.0000 [IU] | INTRAVENOUS | Status: DC | PRN
  Administered 2015-08-14: 1 [IU]/h via INTRAVENOUS

## 2015-08-14 MED ORDER — DEXTROSE 5 % IV SOLN
1.5000 g | Freq: Two times a day (BID) | INTRAVENOUS | Status: AC
  Administered 2015-08-14 – 2015-08-16 (×4): 1.5 g via INTRAVENOUS
  Filled 2015-08-14 (×4): qty 1.5

## 2015-08-14 MED ORDER — ACETAMINOPHEN 160 MG/5ML PO SOLN
1000.0000 mg | Freq: Four times a day (QID) | ORAL | Status: DC
  Administered 2015-08-14 – 2015-08-15 (×2): 1000 mg
  Filled 2015-08-14 (×2): qty 40.6

## 2015-08-14 MED ORDER — PHENYLEPHRINE 40 MCG/ML (10ML) SYRINGE FOR IV PUSH (FOR BLOOD PRESSURE SUPPORT)
PREFILLED_SYRINGE | INTRAVENOUS | Status: AC
  Filled 2015-08-14: qty 10

## 2015-08-14 MED ORDER — SODIUM CHLORIDE 0.45 % IV SOLN: INTRAVENOUS | Status: DC | PRN

## 2015-08-14 MED ORDER — SODIUM CHLORIDE 0.9 % IJ SOLN
OROMUCOSAL | Status: DC | PRN
  Administered 2015-08-14 (×5): 4 mL via TOPICAL

## 2015-08-14 MED ORDER — MILRINONE IN DEXTROSE 20 MG/100ML IV SOLN
0.3000 ug/kg/min | INTRAVENOUS | Status: DC
  Administered 2015-08-15 – 2015-08-16 (×4): 0.3 ug/kg/min via INTRAVENOUS
  Filled 2015-08-14 (×3): qty 100

## 2015-08-14 MED ORDER — ALBUMIN HUMAN 5 % IV SOLN
INTRAVENOUS | Status: DC | PRN
Start: 2015-08-14 — End: 2015-08-14
  Administered 2015-08-14 (×4): via INTRAVENOUS

## 2015-08-14 MED ORDER — ROCURONIUM BROMIDE 100 MG/10ML IV SOLN
INTRAVENOUS | Status: DC | PRN
  Administered 2015-08-14 (×4): 50 mg via INTRAVENOUS

## 2015-08-14 MED ORDER — LACTATED RINGERS IV SOLN: INTRAVENOUS | Status: DC

## 2015-08-14 MED ORDER — MIDAZOLAM HCL 2 MG/2ML IJ SOLN: 2.0000 mg | INTRAMUSCULAR | Status: DC | PRN

## 2015-08-14 MED ORDER — FUROSEMIDE 10 MG/ML IJ SOLN
INTRAMUSCULAR | Status: DC | PRN
  Administered 2015-08-14: 20 mg via INTRAMUSCULAR

## 2015-08-14 MED ORDER — LACTATED RINGERS IV SOLN
INTRAVENOUS | Status: DC | PRN
  Administered 2015-08-14 (×3): via INTRAVENOUS

## 2015-08-14 MED ORDER — OXYCODONE HCL 5 MG PO TABS: 5.0000 mg | ORAL_TABLET | ORAL | Status: DC | PRN

## 2015-08-14 MED ORDER — DOCUSATE SODIUM 100 MG PO CAPS
200.0000 mg | ORAL_CAPSULE | Freq: Every day | ORAL | Status: DC
  Administered 2015-08-15 – 2015-08-22 (×8): 200 mg via ORAL
  Filled 2015-08-14 (×8): qty 2

## 2015-08-14 MED ORDER — ONDANSETRON HCL 4 MG/2ML IJ SOLN
4.0000 mg | Freq: Four times a day (QID) | INTRAMUSCULAR | Status: DC | PRN
  Administered 2015-08-15 – 2015-08-16 (×2): 4 mg via INTRAVENOUS
  Filled 2015-08-14: qty 2

## 2015-08-14 MED ORDER — SODIUM CHLORIDE 0.9 % IJ SOLN
3.0000 mL | Freq: Two times a day (BID) | INTRAMUSCULAR | Status: DC
  Administered 2015-08-15 – 2015-08-21 (×11): 3 mL via INTRAVENOUS

## 2015-08-14 MED ORDER — FENTANYL CITRATE (PF) 100 MCG/2ML IJ SOLN
INTRAMUSCULAR | Status: DC | PRN
  Administered 2015-08-14: 100 ug via INTRAVENOUS
  Administered 2015-08-14: 400 ug via INTRAVENOUS
  Administered 2015-08-14: 150 ug via INTRAVENOUS
  Administered 2015-08-14: 100 ug via INTRAVENOUS
  Administered 2015-08-14: 250 ug via INTRAVENOUS

## 2015-08-14 MED ORDER — METOPROLOL TARTRATE 12.5 MG HALF TABLET
12.5000 mg | ORAL_TABLET | Freq: Two times a day (BID) | ORAL | Status: DC
  Administered 2015-08-15 – 2015-08-19 (×9): 12.5 mg via ORAL
  Filled 2015-08-14 (×10): qty 1

## 2015-08-14 MED ORDER — SODIUM CHLORIDE 0.9 % IV SOLN
10.0000 g | INTRAVENOUS | Status: DC | PRN
  Administered 2015-08-14: 5 g/h via INTRAVENOUS

## 2015-08-14 MED ORDER — HEPARIN SODIUM (PORCINE) 1000 UNIT/ML IJ SOLN
INTRAMUSCULAR | Status: DC | PRN
  Administered 2015-08-14: 9 mL via INTRAVENOUS
  Administered 2015-08-14 (×2): 2 mL via INTRAVENOUS
  Administered 2015-08-14: 20 mL via INTRAVENOUS

## 2015-08-14 MED ORDER — ACETAMINOPHEN 500 MG PO TABS
1000.0000 mg | ORAL_TABLET | Freq: Four times a day (QID) | ORAL | Status: DC
Start: 2015-08-15 — End: 2015-08-19
  Administered 2015-08-15 – 2015-08-19 (×17): 1000 mg via ORAL
  Filled 2015-08-14 (×18): qty 2

## 2015-08-14 MED ORDER — MORPHINE SULFATE (PF) 2 MG/ML IV SOLN
1.0000 mg | INTRAVENOUS | Status: AC | PRN
  Administered 2015-08-14 (×3): 4 mg via INTRAVENOUS
  Filled 2015-08-14 (×3): qty 2

## 2015-08-14 MED ORDER — METOPROLOL TARTRATE 25 MG/10 ML ORAL SUSPENSION: 12.5000 mg | Freq: Two times a day (BID) | ORAL | Status: DC

## 2015-08-14 MED ORDER — POTASSIUM CHLORIDE 10 MEQ/50ML IV SOLN
10.0000 meq | INTRAVENOUS | Status: AC
  Administered 2015-08-14 – 2015-08-15 (×3): 10 meq via INTRAVENOUS
  Filled 2015-08-14 (×2): qty 50

## 2015-08-14 MED ORDER — BISACODYL 10 MG RE SUPP
10.0000 mg | Freq: Every day | RECTAL | Status: DC
  Filled 2015-08-14: qty 1

## 2015-08-14 MED ORDER — MIDAZOLAM HCL 10 MG/2ML IJ SOLN
INTRAMUSCULAR | Status: AC
  Filled 2015-08-14: qty 2

## 2015-08-14 MED ORDER — POTASSIUM CHLORIDE 10 MEQ/50ML IV SOLN
10.0000 meq | INTRAVENOUS | Status: AC
  Administered 2015-08-14 (×2): 10 meq via INTRAVENOUS

## 2015-08-14 MED ORDER — ANTISEPTIC ORAL RINSE SOLUTION (CORINZ)
7.0000 mL | OROMUCOSAL | Status: DC
  Administered 2015-08-14 – 2015-08-15 (×8): 7 mL via OROMUCOSAL

## 2015-08-14 MED ORDER — TRAMADOL HCL 50 MG PO TABS
50.0000 mg | ORAL_TABLET | ORAL | Status: DC | PRN
  Administered 2015-08-17 – 2015-08-19 (×3): 50 mg via ORAL
  Filled 2015-08-14 (×2): qty 2
  Filled 2015-08-14 (×2): qty 1

## 2015-08-14 MED ORDER — ACETAMINOPHEN 650 MG RE SUPP
650.0000 mg | Freq: Once | RECTAL | Status: AC
  Administered 2015-08-14: 650 mg via RECTAL

## 2015-08-14 MED ORDER — SODIUM CHLORIDE 0.9 % IJ SOLN
10.0000 mL | INTRAMUSCULAR | Status: DC | PRN
  Administered 2015-08-18 (×2): 20 mL
  Filled 2015-08-14 (×2): qty 40

## 2015-08-14 MED ORDER — PANTOPRAZOLE SODIUM 40 MG PO TBEC
40.0000 mg | DELAYED_RELEASE_TABLET | Freq: Every day | ORAL | Status: DC
  Administered 2015-08-15 – 2015-08-22 (×8): 40 mg via ORAL
  Filled 2015-08-14 (×8): qty 1

## 2015-08-14 MED ORDER — DEXMEDETOMIDINE HCL IN NACL 200 MCG/50ML IV SOLN
0.0000 ug/kg/h | INTRAVENOUS | Status: DC
  Administered 2015-08-14: 0.7 ug/kg/h via INTRAVENOUS
  Filled 2015-08-14: qty 50

## 2015-08-14 MED ORDER — MIDAZOLAM HCL 5 MG/5ML IJ SOLN
INTRAMUSCULAR | Status: DC | PRN
Start: 2015-08-14 — End: 2015-08-14
  Administered 2015-08-14: 3 mg via INTRAVENOUS
  Administered 2015-08-14: 5 mg via INTRAVENOUS
  Administered 2015-08-14: 2 mg via INTRAVENOUS

## 2015-08-14 MED ORDER — EPINEPHRINE HCL 0.1 MG/ML IJ SOSY
PREFILLED_SYRINGE | INTRAMUSCULAR | Status: DC | PRN
  Administered 2015-08-14: 100 ug via INTRAVENOUS
  Administered 2015-08-14 (×3): 50 ug via INTRAVENOUS
  Administered 2015-08-14 (×2): 100 ug via INTRAVENOUS
  Administered 2015-08-14: 50 ug via INTRAVENOUS

## 2015-08-14 MED ORDER — ROCURONIUM BROMIDE 50 MG/5ML IV SOLN
INTRAVENOUS | Status: AC
  Filled 2015-08-14: qty 2

## 2015-08-14 MED ORDER — LACTATED RINGERS IV SOLN: 500.0000 mL | Freq: Once | INTRAVENOUS | Status: DC | PRN

## 2015-08-14 MED ORDER — SUCCINYLCHOLINE CHLORIDE 20 MG/ML IJ SOLN
INTRAMUSCULAR | Status: AC
  Filled 2015-08-14: qty 1

## 2015-08-14 MED ORDER — FENTANYL CITRATE (PF) 250 MCG/5ML IJ SOLN
INTRAMUSCULAR | Status: AC
  Filled 2015-08-14: qty 20

## 2015-08-14 MED ORDER — SODIUM CHLORIDE 0.9 % IV SOLN
200.0000 ug | INTRAVENOUS | Status: DC | PRN
  Administered 2015-08-14: 0.2 ug/kg/h via INTRAVENOUS

## 2015-08-14 MED ORDER — BISACODYL 5 MG PO TBEC
10.0000 mg | DELAYED_RELEASE_TABLET | Freq: Every day | ORAL | Status: DC
  Administered 2015-08-16 – 2015-08-20 (×4): 10 mg via ORAL
  Filled 2015-08-14 (×6): qty 2

## 2015-08-14 MED ORDER — CHLORHEXIDINE GLUCONATE 0.12% ORAL RINSE (MEDLINE KIT)
15.0000 mL | Freq: Two times a day (BID) | OROMUCOSAL | Status: DC
  Administered 2015-08-14 – 2015-08-15 (×2): 15 mL via OROMUCOSAL

## 2015-08-14 MED ORDER — PROPOFOL 10 MG/ML IV BOLUS
INTRAVENOUS | Status: AC
  Filled 2015-08-14: qty 40

## 2015-08-14 MED ORDER — PROPOFOL 10 MG/ML IV BOLUS
INTRAVENOUS | Status: DC | PRN
  Administered 2015-08-14: 50 mg via INTRAVENOUS
  Administered 2015-08-14: 40 mg via INTRAVENOUS

## 2015-08-14 MED ORDER — ACETAMINOPHEN 160 MG/5ML PO SOLN: 650.0000 mg | Freq: Once | ORAL | Status: AC

## 2015-08-14 MED ORDER — DEXMEDETOMIDINE HCL IN NACL 400 MCG/100ML IV SOLN
0.0000 ug/kg/h | INTRAVENOUS | Status: DC
  Administered 2015-08-14: 0.7 ug/kg/h via INTRAVENOUS
  Administered 2015-08-15: 0.5 ug/kg/h via INTRAVENOUS
  Filled 2015-08-14 (×2): qty 100

## 2015-08-14 MED ORDER — SODIUM CHLORIDE 0.9 % IJ SOLN
10.0000 mL | Freq: Two times a day (BID) | INTRAMUSCULAR | Status: DC
  Administered 2015-08-14 – 2015-08-21 (×10): 10 mL

## 2015-08-14 MED ORDER — EPHEDRINE SULFATE 50 MG/ML IJ SOLN
INTRAMUSCULAR | Status: AC
  Filled 2015-08-14: qty 1

## 2015-08-14 MED ORDER — ALBUMIN HUMAN 5 % IV SOLN: 250.0000 mL | INTRAVENOUS | Status: AC | PRN

## 2015-08-14 MED ORDER — INSULIN REGULAR BOLUS VIA INFUSION
0.0000 [IU] | Freq: Three times a day (TID) | INTRAVENOUS | Status: DC
  Filled 2015-08-14: qty 10

## 2015-08-14 MED FILL — Heparin Sodium (Porcine) Inj 1000 Unit/ML: INTRAMUSCULAR | Qty: 30 | Status: AC

## 2015-08-14 MED FILL — Mannitol IV Soln 20%: INTRAVENOUS | Qty: 500 | Status: AC

## 2015-08-14 MED FILL — Lidocaine HCl IV Inj 20 MG/ML: INTRAVENOUS | Qty: 10 | Status: AC

## 2015-08-14 MED FILL — Sodium Bicarbonate IV Soln 8.4%: INTRAVENOUS | Qty: 100 | Status: AC

## 2015-08-14 MED FILL — Electrolyte-R (PH 7.4) Solution: INTRAVENOUS | Qty: 6000 | Status: AC

## 2015-08-14 MED FILL — Heparin Sodium (Porcine) Inj 1000 Unit/ML: INTRAMUSCULAR | Qty: 10 | Status: AC

## 2015-08-14 MED FILL — Sodium Chloride IV Soln 0.9%: INTRAVENOUS | Qty: 2000 | Status: AC

## 2015-08-14 SURGICAL SUPPLY — 102 items
ADAPTER CARDIO PERF ANTE/RETRO (ADAPTER) ×4 IMPLANT
BAG DECANTER FOR FLEXI CONT (MISCELLANEOUS) ×4 IMPLANT
BANDAGE ELASTIC 4 VELCRO ST LF (GAUZE/BANDAGES/DRESSINGS) ×4 IMPLANT
BANDAGE ELASTIC 6 VELCRO ST LF (GAUZE/BANDAGES/DRESSINGS) ×4 IMPLANT
BANDAGE HEMOSTAT MRDH 4X4 STRL (MISCELLANEOUS) ×2 IMPLANT
BASKET HEART  (ORDER IN 25'S) (MISCELLANEOUS) ×1
BASKET HEART (ORDER IN 25'S) (MISCELLANEOUS) ×1
BASKET HEART (ORDER IN 25S) (MISCELLANEOUS) ×2 IMPLANT
BINDER BREAST XLRG (GAUZE/BANDAGES/DRESSINGS) ×4 IMPLANT
BLADE NEEDLE 3 SS STRL (BLADE) ×6 IMPLANT
BLADE NEEDLE 3MM SS STRL (BLADE) ×2
BLADE STERNUM SYSTEM 6 (BLADE) ×4 IMPLANT
BLADE SURG 11 STRL SS (BLADE) ×4 IMPLANT
BLADE SURG 12 STRL SS (BLADE) ×4 IMPLANT
BLADE SURG ROTATE 9660 (MISCELLANEOUS) IMPLANT
BNDG GAUZE ELAST 4 BULKY (GAUZE/BANDAGES/DRESSINGS) ×4 IMPLANT
BNDG HEMOSTAT MRDH 4X4 STRL (MISCELLANEOUS) ×4
CANISTER SUCTION 2500CC (MISCELLANEOUS) ×4 IMPLANT
CANNULA GUNDRY RCSP 15FR (MISCELLANEOUS) ×4 IMPLANT
CATH CPB KIT VANTRIGT (MISCELLANEOUS) ×4 IMPLANT
CATH FOLEY 2WAY SLVR  5CC 16FR (CATHETERS) ×2
CATH FOLEY 2WAY SLVR 5CC 16FR (CATHETERS) ×2 IMPLANT
CATH ROBINSON RED A/P 18FR (CATHETERS) ×12 IMPLANT
CATH THORACIC 36FR RT ANG (CATHETERS) ×8 IMPLANT
CLIP TI WIDE RED SMALL 24 (CLIP) ×16 IMPLANT
CONN 3/8X3/8 GISH STERILE (MISCELLANEOUS) ×4 IMPLANT
COVER LIGHT HANDLE STERIS (MISCELLANEOUS) ×8 IMPLANT
COVER SURGICAL LIGHT HANDLE (MISCELLANEOUS) ×4 IMPLANT
CRADLE DONUT ADULT HEAD (MISCELLANEOUS) ×4 IMPLANT
DRAIN CHANNEL 32F RND 10.7 FF (WOUND CARE) ×4 IMPLANT
DRAPE CARDIOVASCULAR INCISE (DRAPES) ×2
DRAPE SLUSH/WARMER DISC (DRAPES) ×4 IMPLANT
DRAPE SRG 135X102X78XABS (DRAPES) ×2 IMPLANT
DRSG AQUACEL AG ADV 3.5X14 (GAUZE/BANDAGES/DRESSINGS) ×4 IMPLANT
ELECT BLADE 4.0 EZ CLEAN MEGAD (MISCELLANEOUS) ×4
ELECT BLADE 6.5 EXT (BLADE) ×4 IMPLANT
ELECT CAUTERY BLADE 6.4 (BLADE) ×4 IMPLANT
ELECT REM PT RETURN 9FT ADLT (ELECTROSURGICAL) ×8
ELECTRODE BLDE 4.0 EZ CLN MEGD (MISCELLANEOUS) ×2 IMPLANT
ELECTRODE REM PT RTRN 9FT ADLT (ELECTROSURGICAL) ×4 IMPLANT
GAUZE SPONGE 4X4 12PLY STRL (GAUZE/BANDAGES/DRESSINGS) ×8 IMPLANT
GLOVE BIO SURGEON STRL SZ7.5 (GLOVE) ×12 IMPLANT
GOWN STRL REUS W/ TWL LRG LVL3 (GOWN DISPOSABLE) ×8 IMPLANT
GOWN STRL REUS W/TWL LRG LVL3 (GOWN DISPOSABLE) ×8
HEMOSTAT POWDER SURGIFOAM 1G (HEMOSTASIS) ×12 IMPLANT
HEMOSTAT SURGICEL 2X14 (HEMOSTASIS) ×4 IMPLANT
INSERT FOGARTY XLG (MISCELLANEOUS) IMPLANT
KIT BASIN OR (CUSTOM PROCEDURE TRAY) ×4 IMPLANT
KIT ROOM TURNOVER OR (KITS) ×4 IMPLANT
KIT SUCTION CATH 14FR (SUCTIONS) ×4 IMPLANT
KIT VASOVIEW W/TROCAR VH 2000 (KITS) ×4 IMPLANT
LEAD PACING MYOCARDI (MISCELLANEOUS) ×4 IMPLANT
MARKER GRAFT CORONARY BYPASS (MISCELLANEOUS) ×12 IMPLANT
NS IRRIG 1000ML POUR BTL (IV SOLUTION) ×24 IMPLANT
PACK OPEN HEART (CUSTOM PROCEDURE TRAY) ×4 IMPLANT
PAD ARMBOARD 7.5X6 YLW CONV (MISCELLANEOUS) ×8 IMPLANT
PAD ELECT DEFIB RADIOL ZOLL (MISCELLANEOUS) ×4 IMPLANT
PENCIL BUTTON HOLSTER BLD 10FT (ELECTRODE) ×4 IMPLANT
SET CARDIOPLEGIA MPS 5001102 (MISCELLANEOUS) ×4 IMPLANT
SPONGE GAUZE 4X4 12PLY STER LF (GAUZE/BANDAGES/DRESSINGS) ×4 IMPLANT
SPONGE LAP 18X18 X RAY DECT (DISPOSABLE) ×20 IMPLANT
SPONGE LAP 4X18 X RAY DECT (DISPOSABLE) ×12 IMPLANT
SURGIFLO W/THROMBIN 8M KIT (HEMOSTASIS) ×8 IMPLANT
SUT BONE WAX W31G (SUTURE) ×4 IMPLANT
SUT ETHIBOND 2 0 SH (SUTURE) ×8
SUT ETHIBOND 2 0 SH 36X2 (SUTURE) ×8 IMPLANT
SUT ETHIBOND NAB MH 2-0 36IN (SUTURE) ×20 IMPLANT
SUT PROLENE 4 0 RB 1 (SUTURE) ×4
SUT PROLENE 4 0 SH DA (SUTURE) ×40 IMPLANT
SUT PROLENE 4-0 RB1 .5 CRCL 36 (SUTURE) ×4 IMPLANT
SUT PROLENE 5 0 C 1 36 (SUTURE) ×8 IMPLANT
SUT PROLENE 6 0 C 1 24 (SUTURE) ×12 IMPLANT
SUT PROLENE 6 0 C 1 30 (SUTURE) ×8 IMPLANT
SUT PROLENE 6 0 CC (SUTURE) ×20 IMPLANT
SUT PROLENE 8 0 BV175 6 (SUTURE) ×24 IMPLANT
SUT PROLENE BLUE 7 0 (SUTURE) ×4 IMPLANT
SUT PROLENE POLY MONO (SUTURE) ×4 IMPLANT
SUT SILK  1 MH (SUTURE) ×8
SUT SILK 1 MH (SUTURE) ×8 IMPLANT
SUT SILK 1 TIES 10X30 (SUTURE) ×4 IMPLANT
SUT SILK 2 0 SH CR/8 (SUTURE) ×12 IMPLANT
SUT SILK 3 0 SH CR/8 (SUTURE) ×8 IMPLANT
SUT SILK 4 0 TIE 10X30 (SUTURE) ×8 IMPLANT
SUT STEEL 6MS V (SUTURE) ×8 IMPLANT
SUT STEEL SZ 6 DBL 3X14 BALL (SUTURE) ×4 IMPLANT
SUT TEM PAC WIRE 2 0 SH (SUTURE) ×16 IMPLANT
SUT VIC AB 1 CTX 36 (SUTURE) ×4
SUT VIC AB 1 CTX36XBRD ANBCTR (SUTURE) ×4 IMPLANT
SUT VIC AB 2-0 CT1 27 (SUTURE) ×2
SUT VIC AB 2-0 CT1 TAPERPNT 27 (SUTURE) ×2 IMPLANT
SUT VIC AB 2-0 CTX 27 (SUTURE) ×8 IMPLANT
SUT VIC AB 3-0 X1 27 (SUTURE) ×8 IMPLANT
SYR 5ML LUER SLIP (SYRINGE) ×4 IMPLANT
SYSTEM SAHARA CHEST DRAIN ATS (WOUND CARE) ×8 IMPLANT
TAPE CLOTH SURG 4X10 WHT LF (GAUZE/BANDAGES/DRESSINGS) ×4 IMPLANT
TAPE PAPER 3X10 WHT MICROPORE (GAUZE/BANDAGES/DRESSINGS) ×4 IMPLANT
TOWEL OR 17X24 6PK STRL BLUE (TOWEL DISPOSABLE) ×8 IMPLANT
TOWEL OR 17X26 10 PK STRL BLUE (TOWEL DISPOSABLE) ×8 IMPLANT
TRAY FOLEY IC TEMP SENS 16FR (CATHETERS) ×4 IMPLANT
TUBING INSUFFLATION (TUBING) ×4 IMPLANT
UNDERPAD 30X30 INCONTINENT (UNDERPADS AND DIAPERS) ×4 IMPLANT
WATER STERILE IRR 1000ML POUR (IV SOLUTION) ×8 IMPLANT

## 2015-08-14 NOTE — Progress Notes (Signed)
  Echocardiogram Echocardiogram Transesophageal has been performed.  [name] [name] 08/14/2015, 8:31 AM

## 2015-08-14 NOTE — Transfer of Care (Signed)
Immediate Anesthesia Transfer of Care Note  Patient: [name] [name]  Procedure(s) Performed: Procedure(s): CORONARY ARTERY BYPASS GRAFTING (CABG)X3 LIMA-LAD; SVG-RAMUS; SVG-PD TRANSESOPHAGEAL ECHOCARDIOGRAM (TEE) (N/A) TRANSESOPHAGEAL ECHOCARDIOGRAM (TEE) (N/A)  Patient Location: SICU  Anesthesia Type:General  Level of Consciousness: Patient remains intubated per anesthesia plan  Airway & Oxygen Therapy: Patient remains intubated per anesthesia plan and Patient placed on Ventilator (see vital sign flow sheet for setting)  Post-op Assessment: Report given to RN and Post -op Vital signs reviewed and stable  Post vital signs: Reviewed and stable  Last Vitals:  Filed Vitals:   08/13/15 2133 08/14/15 0439  BP: 119/60 128/62  Pulse: 71 63  Temp: 36.6 C 36.6 C  Resp: 18 18    Complications: No apparent anesthesia complications

## 2015-08-14 NOTE — Progress Notes (Signed)
[facility] BRIEF SICU PROGRESS NOTE  Day of Surgery  S/P Procedure(s) (LRB): CORONARY ARTERY BYPASS GRAFTING (CABG)X3 LIMA-LAD; SVG-RAMUS; SVG-PD TRANSESOPHAGEAL ECHOCARDIOGRAM (TEE) (N/A) TRANSESOPHAGEAL ECHOCARDIOGRAM (TEE) (N/A)   Sedated on vent AV paced w/ stable hemodynamics on Neo, Dopamine and milrinone drips O2 sats 100% on 50% FiO2 Chest tube output low UOP excellent Labs okay except potassium 3.3  Plan: Routine care.  Supplement potassium  Rexene Alberts, MD 08/14/2015 6:23 PM

## 2015-08-14 NOTE — Brief Op Note (Signed)
08/10/2015 - 08/14/2015      [address]       ,[place] [address]             [phone]     08/10/2015 - 08/14/2015  12:45 PM  PATIENT:  [name] [name]  75 y.o. female  PRE-OPERATIVE DIAGNOSIS:  CAD  POST-OPERATIVE DIAGNOSIS:  CAD  PROCEDURE:  Procedure(s): CORONARY ARTERY BYPASS GRAFTING (CABG)X3 LIMA-LAD; SVG-RAMUS; SVG-PD TRANSESOPHAGEAL ECHOCARDIOGRAM (TEE)  SURGEON:  Surgeon(s): [name], MD  PHYSICIAN ASSISTANT: WAYNE GOLD PA-C  ANESTHESIA:   general  PATIENT CONDITION:  ICU - intubated and hemodynamically stable.  PRE-OPERATIVE WEIGHT: [id number]  COMPLICATIONS: LACERATION TO BRANCH IF INNOMINATE VEIN

## 2015-08-14 NOTE — Progress Notes (Signed)
The patient was examined and preop studies reviewed. There has been no change from the prior exam and the patient is ready for surgery.  plan CABG on F [place]

## 2015-08-14 NOTE — Anesthesia Procedure Notes (Addendum)
Central Venous Catheter Insertion Performed by: anesthesiologist Patient location: Pre-op. Preanesthetic checklist: patient identified, IV checked, site marked, risks and benefits discussed, surgical consent, monitors and equipment checked, pre-op evaluation, timeout performed and anesthesia consent Position: Trendelenburg Lidocaine 1% used for infiltration Landmarks identified and Seldinger technique used Catheter size: 9 Fr Central line was placed.MAC introducer Procedure performed using ultrasound guided technique. Attempts: 1 Following insertion, line sutured and dressing applied. Post procedure assessment: blood return through all ports, free fluid flow and no air. Patient tolerated the procedure well with no immediate complications.    Central Venous Catheter Insertion Performed by: anesthesiologist Patient location: Pre-op. Preanesthetic checklist: patient identified, IV checked, site marked, risks and benefits discussed, surgical consent, monitors and equipment checked, pre-op evaluation, timeout performed and anesthesia consent Landmarks identified PA cath was placed.Swan type and PA catheter depth:thermodilation and 47PA Cath depth:47 Procedure performed without using ultrasound guided technique. Attempts: 1 Patient tolerated the procedure well with no immediate complications.    Procedure Name: Intubation Date/Time: 08/14/2015 7:42 AM Performed by: Ollen Bowl Pre-anesthesia Checklist: Patient identified, Emergency Drugs available, Suction available, Patient being monitored and Timeout performed Patient Re-evaluated:Patient Re-evaluated prior to inductionOxygen Delivery Method: Circle system utilized and Simple face mask Preoxygenation: Pre-oxygenation with 100% oxygen Intubation Type: IV induction Ventilation: Mask ventilation without difficulty Laryngoscope Size: Miller and 2 Grade View: Grade I Tube type: Subglottic suction tube Tube size: 8.0 mm Number of  attempts: 1 Airway Equipment and Method: Patient positioned with wedge pillow and Stylet Placement Confirmation: ETT inserted through vocal cords under direct vision,  positive ETCO2 and breath sounds checked- equal and bilateral Secured at: 22 cm Tube secured with: Tape Dental Injury: Teeth and Oropharynx as per pre-operative assessment

## 2015-08-14 NOTE — Plan of Care (Signed)
Problem: Respiratory: Goal: Levels of oxygenation will improve Outcome: Progressing Pt to remain on vent overnight per MD order Goal: Ability to tolerate decreased levels of ventilator support will improve Outcome: Progressing Able to wean FiO2, plan to extubate on morning rounds

## 2015-08-14 NOTE — OR Nursing (Signed)
1st call @1336  to [facility]: providing a patient update to 2S 2nd call @1346  to [place]: providing a patient update to 2S 3rd call @1417  to [place]: providing a patient update to 2S (informed of being on hold per Tamela Oddi and Altha Harm) Per Altha Harm, plans for transport to Room 4 4th call made @1509 

## 2015-08-15 ENCOUNTER — Inpatient Hospital Stay (HOSPITAL_COMMUNITY): Payer: Medicare PPO

## 2015-08-15 ENCOUNTER — Encounter (HOSPITAL_COMMUNITY): Payer: Self-pay | Admitting: Cardiothoracic Surgery

## 2015-08-15 LAB — CREATININE, SERUM
Creatinine, Ser: 0.96 mg/dL (ref 0.44–1.00)
GFR calc Af Amer: >60 mL/min (ref >60)
GFR calc non Af Amer: 57 mL/min — ABNORMAL LOW (ref >60)

## 2015-08-15 LAB — PREPARE CRYOPRECIPITATE
UNIT DIVISION: 0
UNIT DIVISION: 0
Unit division: 0
Unit division: 0

## 2015-08-15 LAB — POCT I-STAT, CHEM 8
BUN: 17 mg/dL (ref 6–20)
CALCIUM ION: 1.05 mmol/L — AB (ref 1.13–1.30)
CHLORIDE: 106 mmol/L (ref 101–111)
Creatinine, Ser: 0.8 mg/dL (ref 0.44–1.00)
Glucose, Bld: 134 mg/dL — ABNORMAL HIGH (ref 65–99)
HCT: 32 % — ABNORMAL LOW (ref 36.0–46.0)
Hemoglobin: 10.9 g/dL — ABNORMAL LOW (ref 12.0–15.0)
POTASSIUM: 3.6 mmol/L (ref 3.5–5.1)
SODIUM: 142 mmol/L (ref 135–145)
TCO2: 24 mmol/L (ref 0–100)

## 2015-08-15 LAB — POCT I-STAT 3, ART BLOOD GAS (G3+)
ACID-BASE EXCESS: 1 mmol/L (ref 0.0–2.0)
ACID-BASE EXCESS: 2 mmol/L (ref 0.0–2.0)
Bicarbonate: 23 mEq/L (ref 20.0–24.0)
Bicarbonate: 24.2 mEq/L — ABNORMAL HIGH (ref 20.0–24.0)
Bicarbonate: 26.2 mEq/L — ABNORMAL HIGH (ref 20.0–24.0)
O2 SAT: 98 %
O2 SAT: 95 %
O2 SAT: 97 %
PH ART: 7.512 — AB (ref 7.350–7.450)
PO2 ART: 78 mmHg — AB (ref 80.0–100.0)
TCO2: 24 mmol/L (ref 0–100)
TCO2: 25 mmol/L (ref 0–100)
TCO2: 27 mmol/L (ref 0–100)
pCO2 arterial: 28.8 mmHg — ABNORMAL LOW (ref 35.0–45.0)
pCO2 arterial: 37.1 mmHg (ref 35.0–45.0)
pCO2 arterial: 37 mmHg (ref 35.0–45.0)
pH, Arterial: 7.425 (ref 7.350–7.450)
pH, Arterial: 7.458 — ABNORMAL HIGH (ref 7.350–7.450)
pO2, Arterial: 99 mmHg (ref 80.0–100.0)
pO2, Arterial: 82 mmHg (ref 80.0–100.0)

## 2015-08-15 LAB — GLUCOSE, CAPILLARY
GLUCOSE-CAPILLARY: 102 mg/dL — AB (ref 65–99)
GLUCOSE-CAPILLARY: 102 mg/dL — AB (ref 65–99)
GLUCOSE-CAPILLARY: 103 mg/dL — AB (ref 65–99)
GLUCOSE-CAPILLARY: 107 mg/dL — AB (ref 65–99)
GLUCOSE-CAPILLARY: 107 mg/dL — AB (ref 65–99)
GLUCOSE-CAPILLARY: 112 mg/dL — AB (ref 65–99)
GLUCOSE-CAPILLARY: 117 mg/dL — AB (ref 65–99)
GLUCOSE-CAPILLARY: 120 mg/dL — AB (ref 65–99)
GLUCOSE-CAPILLARY: 124 mg/dL — AB (ref 65–99)
GLUCOSE-CAPILLARY: 132 mg/dL — AB (ref 65–99)
GLUCOSE-CAPILLARY: 137 mg/dL — AB (ref 65–99)
GLUCOSE-CAPILLARY: 87 mg/dL (ref 65–99)
GLUCOSE-CAPILLARY: 95 mg/dL (ref 65–99)
Glucose-Capillary: 154 mg/dL — ABNORMAL HIGH (ref 65–99)
Glucose-Capillary: 142 mg/dL — ABNORMAL HIGH (ref 65–99)
Glucose-Capillary: 103 mg/dL — ABNORMAL HIGH (ref 65–99)
Glucose-Capillary: 96 mg/dL (ref 65–99)
Glucose-Capillary: 116 mg/dL — ABNORMAL HIGH (ref 65–99)
Glucose-Capillary: 107 mg/dL — ABNORMAL HIGH (ref 65–99)
Glucose-Capillary: 116 mg/dL — ABNORMAL HIGH (ref 65–99)
Glucose-Capillary: 59 mg/dL — ABNORMAL LOW (ref 65–99)
Glucose-Capillary: 103 mg/dL — ABNORMAL HIGH (ref 65–99)
Glucose-Capillary: 92 mg/dL (ref 65–99)
Glucose-Capillary: 126 mg/dL — ABNORMAL HIGH (ref 65–99)

## 2015-08-15 LAB — BASIC METABOLIC PANEL
ANION GAP: 9 (ref 5–15)
BUN: 12 mg/dL (ref 6–20)
CO2: 22 mmol/L (ref 22–32)
Calcium: 7.5 mg/dL — ABNORMAL LOW (ref 8.9–10.3)
Chloride: 112 mmol/L — ABNORMAL HIGH (ref 101–111)
Creatinine, Ser: 0.81 mg/dL (ref 0.44–1.00)
GFR calc Af Amer: >60 mL/min (ref >60)
GFR calc non Af Amer: >60 mL/min (ref >60)
GLUCOSE: 105 mg/dL — AB (ref 65–99)
POTASSIUM: 3.8 mmol/L (ref 3.5–5.1)
Sodium: 143 mmol/L (ref 135–145)

## 2015-08-15 LAB — PREPARE FRESH FROZEN PLASMA
UNIT DIVISION: 0
UNIT DIVISION: 0
Unit division: 0
Unit division: 0

## 2015-08-15 LAB — CBC
HEMATOCRIT: 33 % — AB (ref 36.0–46.0)
HEMATOCRIT: 31.4 % — AB (ref 36.0–46.0)
Hemoglobin: 11.2 g/dL — ABNORMAL LOW (ref 12.0–15.0)
Hemoglobin: 10.8 g/dL — ABNORMAL LOW (ref 12.0–15.0)
MCH: 28.6 pg (ref 26.0–34.0)
MCH: 29.1 pg (ref 26.0–34.0)
MCHC: 33.9 g/dL (ref 30.0–36.0)
MCHC: 34.4 g/dL (ref 30.0–36.0)
MCV: 84.4 fL (ref 78.0–100.0)
MCV: 84.6 fL (ref 78.0–100.0)
PLATELETS: 76 10*3/uL — AB (ref 150–400)
Platelets: 86 10*3/uL — ABNORMAL LOW (ref 150–400)
RBC: 3.91 MIL/uL (ref 3.87–5.11)
RBC: 3.71 MIL/uL — AB (ref 3.87–5.11)
RDW: 14.6 % (ref 11.5–15.5)
RDW: 15.2 % (ref 11.5–15.5)
WBC: 9.8 10*3/uL (ref 4.0–10.5)
WBC: 11.4 10*3/uL — AB (ref 4.0–10.5)

## 2015-08-15 LAB — PREPARE PLATELET PHERESIS
UNIT DIVISION: 0
Unit division: 0

## 2015-08-15 LAB — CARBOXYHEMOGLOBIN
Carboxyhemoglobin: 1.3 % (ref 0.5–1.5)
Methemoglobin: 0.8 % (ref 0.0–1.5)
O2 Saturation: 69.7 %
Total hemoglobin: 12.3 g/dL (ref 12.0–16.0)

## 2015-08-15 LAB — MAGNESIUM
Magnesium: 2.5 mg/dL — ABNORMAL HIGH (ref 1.7–2.4)
Magnesium: 2.3 mg/dL (ref 1.7–2.4)

## 2015-08-15 MED ORDER — FUROSEMIDE 10 MG/ML IJ SOLN
20.0000 mg | Freq: Four times a day (QID) | INTRAMUSCULAR | Status: DC
  Administered 2015-08-16 – 2015-08-19 (×14): 20 mg via INTRAVENOUS
  Filled 2015-08-15 (×14): qty 2

## 2015-08-15 MED ORDER — POTASSIUM CHLORIDE 10 MEQ/50ML IV SOLN
10.0000 meq | INTRAVENOUS | Status: AC
  Administered 2015-08-15 (×3): 10 meq via INTRAVENOUS
  Filled 2015-08-15 (×3): qty 50

## 2015-08-15 MED ORDER — INSULIN DETEMIR 100 UNIT/ML ~~LOC~~ SOLN
6.0000 [IU] | Freq: Two times a day (BID) | SUBCUTANEOUS | Status: DC
  Administered 2015-08-15 – 2015-08-18 (×8): 6 [IU] via SUBCUTANEOUS
  Filled 2015-08-15 (×9): qty 0.06

## 2015-08-15 MED ORDER — MORPHINE SULFATE (PF) 2 MG/ML IV SOLN: 1.0000 mg | INTRAVENOUS | Status: AC | PRN

## 2015-08-15 MED ORDER — FUROSEMIDE 10 MG/ML IJ SOLN
20.0000 mg | Freq: Two times a day (BID) | INTRAMUSCULAR | Status: DC
  Administered 2015-08-15 (×2): 20 mg via INTRAVENOUS
  Filled 2015-08-15 (×2): qty 2

## 2015-08-15 MED ORDER — INSULIN ASPART 100 UNIT/ML ~~LOC~~ SOLN
0.0000 [IU] | SUBCUTANEOUS | Status: DC
  Administered 2015-08-15 – 2015-08-16 (×2): 2 [IU] via SUBCUTANEOUS

## 2015-08-15 MED ORDER — DOPAMINE-DEXTROSE 3.2-5 MG/ML-% IV SOLN: 0.0000 ug/kg/min | INTRAVENOUS | Status: DC

## 2015-08-15 MED FILL — Potassium Chloride Inj 2 mEq/ML: INTRAVENOUS | Qty: 40 | Status: AC

## 2015-08-15 MED FILL — Dexmedetomidine HCl in NaCl 0.9% IV Soln 400 MCG/100ML: INTRAVENOUS | Qty: 100 | Status: AC

## 2015-08-15 MED FILL — Heparin Sodium (Porcine) Inj 1000 Unit/ML: INTRAMUSCULAR | Qty: 30 | Status: AC

## 2015-08-15 MED FILL — Magnesium Sulfate Inj 50%: INTRAMUSCULAR | Qty: 10 | Status: AC

## 2015-08-15 NOTE — Progress Notes (Signed)
RT NOTE:  Pt has successfully completed the Cardiac Rapid Wean. According to RN per MD order, [name] [name] was to remain intubated throughout the night and prepared for extubation at the beginning of day shift rounding. Parameters were completed: NIF -24, VC 950 mls. ABG within acceptable range. Extubation supplies available at bedside for oncoming RT.

## 2015-08-15 NOTE — Progress Notes (Signed)
1 Day Post-Op Procedure(s) (LRB): CORONARY ARTERY BYPASS GRAFTING (CABG)X3 LIMA-LAD; SVG-RAMUS; SVG-PD TRANSESOPHAGEAL ECHOCARDIOGRAM (TEE) (N/A) TRANSESOPHAGEAL ECHOCARDIOGRAM (TEE) (N/A) Subjective: Stable night Min drainage from chest rtubes Improved plt count and stable Hct  Objective: Vital signs in last 24 hours: Temp:  [94.6 F (34.8 C)-100 F (37.8 C)] 99.1 F (37.3 C) (01/20 0800) Pulse Rate:  [73-93] 79 (01/20 0800) Cardiac Rhythm:  [-] Normal sinus rhythm (01/20 0800) Resp:  [14-20] 18 (01/20 0800) BP: (97-124)/(57-73) 124/67 mmHg (01/20 0630) SpO2:  [98 %-100 %] 99 % (01/20 0800) Arterial Line BP: (97-135)/(56-78) 99/58 mmHg (01/20 0800) FiO2 (%):  [40 %-50 %] 40 % (01/20 0630) Weight:  [201 lb 8 oz (91.4 kg)] 201 lb 8 oz (91.4 kg) (01/20 0500)  Hemodynamic parameters for last 24 hours: PAP: (23-33)/(11-21) 28/21 mmHg CO:  [3.5 L/min-4.8 L/min] 3.5 L/min CI:  [1.8 L/min/m2-2.5 L/min/m2] 1.8 L/min/m2  Intake/Output from previous day: 01/19 0701 - 01/20 0700 In: 12157 [I.V.:5402; [id number]; NG/GT:60; IV Piggyback:1550] Out: 8740 [Urine:8135; Chest Tube:605] Intake/Output this shift: Total I/O In: 126.9 [I.V.:76.9; IV Piggyback:50] Out: 60 [Urine:25; Chest Tube:35]  Neuro intact extrem warm Lungs  clear  Lab Results:  Recent Labs  08/14/15 2150 08/15/15 0400  WBC 9.1 9.8  HGB 11.9* 11.2*  HCT 35.2* 33.0*  PLT 87* 86*   BMET:  Recent Labs  08/14/15 0300  08/14/15 2146 08/14/15 2150 08/15/15 0400  NA 142  < > 144  --  143  K 4.0  < > 3.6  --  3.8  CL 110  < > 105  --  112*  CO2 25  --   --   --  22  GLUCOSE 127*  < > 153*  --  105*  BUN 15  < > 10  --  12  CREATININE 0.81  < > 0.60 0.79 0.81  CALCIUM 9.3  --   --   --  7.5*  < > = values in this interval not displayed.  PT/INR:  Recent Labs  08/14/15 1530  LABPROT 17.5*  INR 1.42   ABG    Component Value Date/Time   PHART 7.425 08/15/2015 0653   HCO3 24.2* 08/15/2015 0653   TCO2  25 08/15/2015 0653   O2SAT 95.0 08/15/2015 0653   CBG (last 3)   Recent Labs  08/15/15 0407 08/15/15 0507 08/15/15 0612  GLUCAP 96 112* 116*    Assessment/Plan: S/P Procedure(s) (LRB): CORONARY ARTERY BYPASS GRAFTING (CABG)X3 LIMA-LAD; SVG-RAMUS; SVG-PD TRANSESOPHAGEAL ECHOCARDIOGRAM (TEE) (N/A) TRANSESOPHAGEAL ECHOCARDIOGRAM (TEE) (N/A) Mobilize Diuresis Diabetes control d/c tubes/lines See progression orders wean dopamine for CI > 1.9   LOS: 5 days    [name] III 08/15/2015

## 2015-08-15 NOTE — Procedures (Signed)
Extubation Procedure Note  Patient Details:   Name: [name] [name] DOB: [birth year] MRN: [id number]   Airway Documentation:     Evaluation  O2 sats: stable throughout Complications: No apparent complications Patient did tolerate procedure well. Bilateral Breath Sounds: Clear   Yes  Patient tolerated rapid wean. MD ordered to extubate. Positive for cuff leak. Patient extubated to a 4 Lpm nasal cannula. No signs of dyspnea or stridor. Patient instructed on the Incentive Spirometer achieving 656mL five times. RN at bedside. Patient resting comfortably.   Myrtie Neither 08/15/2015, 8:41 AM

## 2015-08-15 NOTE — Progress Notes (Signed)
      [address]       [place] [address]             [phone]      BP 110/68 mmHg  Pulse 99  Temp(Src) 97.9 F (36.6 C) (Oral)  Resp 18  Ht 5\' 10"  (1.778 m)  Wt 201 lb 8 oz (91.4 kg)  BMI 28.91 kg/m2  SpO2 99%   Intake/Output Summary (Last 24 hours) at 08/15/15 2224 Last data filed at 08/15/15 2200  Gross per 24 hour  Intake 1668.15 ml  Output   2225 ml  Net -556.85 ml   Co-ox= 69 Creatinine 0.96 HCT= 31  Continue present care  Aubery Douthat C. Roxan Hockey, MD [facility] [phone]

## 2015-08-15 NOTE — Progress Notes (Signed)
RT NOTE:  Rapid Wean started 

## 2015-08-16 ENCOUNTER — Encounter (HOSPITAL_COMMUNITY): Payer: Self-pay

## 2015-08-16 ENCOUNTER — Inpatient Hospital Stay (HOSPITAL_COMMUNITY): Payer: Medicare PPO

## 2015-08-16 LAB — BASIC METABOLIC PANEL
Anion gap: 7 (ref 5–15)
BUN: 19 mg/dL (ref 6–20)
CO2: 26 mmol/L (ref 22–32)
Calcium: 7.9 mg/dL — ABNORMAL LOW (ref 8.9–10.3)
Chloride: 107 mmol/L (ref 101–111)
Creatinine, Ser: 1.05 mg/dL — ABNORMAL HIGH (ref 0.44–1.00)
GFR calc Af Amer: 59 mL/min — ABNORMAL LOW (ref >60)
GFR calc non Af Amer: 51 mL/min — ABNORMAL LOW (ref >60)
Glucose, Bld: 154 mg/dL — ABNORMAL HIGH (ref 65–99)
Potassium: 3.8 mmol/L (ref 3.5–5.1)
Sodium: 140 mmol/L (ref 135–145)

## 2015-08-16 LAB — GLUCOSE, CAPILLARY
GLUCOSE-CAPILLARY: 143 mg/dL — AB (ref 65–99)
GLUCOSE-CAPILLARY: 113 mg/dL — AB (ref 65–99)
Glucose-Capillary: 127 mg/dL — ABNORMAL HIGH (ref 65–99)
Glucose-Capillary: 162 mg/dL — ABNORMAL HIGH (ref 65–99)

## 2015-08-16 LAB — CBC
HCT: 29.8 % — ABNORMAL LOW (ref 36.0–46.0)
Hemoglobin: 10 g/dL — ABNORMAL LOW (ref 12.0–15.0)
MCH: 29.1 pg (ref 26.0–34.0)
MCHC: 33.6 g/dL (ref 30.0–36.0)
MCV: 86.6 fL (ref 78.0–100.0)
Platelets: 71 10*3/uL — ABNORMAL LOW (ref 150–400)
RBC: 3.44 MIL/uL — ABNORMAL LOW (ref 3.87–5.11)
RDW: 15.7 % — ABNORMAL HIGH (ref 11.5–15.5)
WBC: 11.3 10*3/uL — ABNORMAL HIGH (ref 4.0–10.5)

## 2015-08-16 LAB — CARBOXYHEMOGLOBIN
Carboxyhemoglobin: 1.6 % — ABNORMAL HIGH (ref 0.5–1.5)
Methemoglobin: 0.7 % (ref 0.0–1.5)
O2 Saturation: 72 %
Total hemoglobin: 10.2 g/dL — ABNORMAL LOW (ref 12.0–16.0)

## 2015-08-16 LAB — FIBRINOGEN: Fibrinogen: 77 mg/dL — CL (ref 204–475)

## 2015-08-16 MED ORDER — CETYLPYRIDINIUM CHLORIDE 0.05 % MT LIQD
7.0000 mL | Freq: Two times a day (BID) | OROMUCOSAL | Status: DC
  Administered 2015-08-16 – 2015-08-21 (×8): 7 mL via OROMUCOSAL

## 2015-08-16 MED ORDER — INSULIN ASPART 100 UNIT/ML ~~LOC~~ SOLN
0.0000 [IU] | Freq: Three times a day (TID) | SUBCUTANEOUS | Status: DC
  Administered 2015-08-16: 3 [IU] via SUBCUTANEOUS
  Administered 2015-08-18 – 2015-08-21 (×5): 2 [IU] via SUBCUTANEOUS

## 2015-08-16 NOTE — Progress Notes (Signed)
2 Days Post-Op Procedure(s) (LRB): CORONARY ARTERY BYPASS GRAFTING (CABG)X3 LIMA-LAD; SVG-RAMUS; SVG-PD TRANSESOPHAGEAL ECHOCARDIOGRAM (TEE) (N/A) TRANSESOPHAGEAL ECHOCARDIOGRAM (TEE) (N/A) Subjective: C/o feeling like she needs to cough but cannot get anything up  Objective: Vital signs in last 24 hours: Temp:  [97.8 F (36.6 C)-99.1 F (37.3 C)] 98.5 F (36.9 C) (01/21 0425) Pulse Rate:  [80-107] 96 (01/21 0800) Cardiac Rhythm:  [-] Normal sinus rhythm (01/21 0430) Resp:  [16-26] 19 (01/21 0800) BP: (88-117)/(57-70) 109/60 mmHg (01/21 0800) SpO2:  [98 %-100 %] 98 % (01/21 0800) Arterial Line BP: (100-139)/(50-66) 116/50 mmHg (01/20 1700) Weight:  [199 lb 4.7 oz (90.4 kg)] 199 lb 4.7 oz (90.4 kg) (01/21 0500)  Hemodynamic parameters for last 24 hours: PAP: (23-30)/(13-21) 24/15 mmHg CO:  [3.2 L/min-4.1 L/min] 4.1 L/min CI:  [1.6 L/min/m2-2.1 L/min/m2] 2.1 L/min/m2  Intake/Output from previous day: 01/20 0701 - 01/21 0700 In: 1129.8 [I.V.:929.8; IV Piggyback:200] Out: 2085 [Urine:1730; Chest Tube:355] Intake/Output this shift: Total I/O In: 77.2 [I.V.:27.2; IV Piggyback:50] Out: -   General appearance: alert, cooperative and no distress Neurologic: intact Heart: regular rate and rhythm Lungs: diminished breath sounds bibasilar Abdomen: normal findings: soft, non-tender  Lab Results:  Recent Labs  08/15/15 1655 08/16/15 0443  WBC 11.4* 11.3*  HGB 10.8* 10.0*  HCT 31.4* 29.8*  PLT 76* 71*   BMET:  Recent Labs  08/15/15 0400 08/15/15 1651 08/15/15 1655 08/16/15 0443  NA 143 142  --  140  K 3.8 3.6  --  3.8  CL 112* 106  --  107  CO2 22  --   --  26  GLUCOSE 105* 134*  --  154*  BUN 12 17  --  19  CREATININE 0.81 0.80 0.96 1.05*  CALCIUM 7.5*  --   --  7.9*    PT/INR:  Recent Labs  08/14/15 1530  LABPROT 17.5*  INR 1.42   ABG    Component Value Date/Time   PHART 7.458* 08/15/2015 0943   HCO3 26.2* 08/15/2015 0943   TCO2 24 08/15/2015 1651   O2SAT 72.0 08/16/2015 0425   CBG (last 3)   Recent Labs  08/15/15 1949 08/15/15 2357 08/16/15 0423  GLUCAP 126* 120* 143*    Assessment/Plan: S/P Procedure(s) (LRB): CORONARY ARTERY BYPASS GRAFTING (CABG)X3 LIMA-LAD; SVG-RAMUS; SVG-PD TRANSESOPHAGEAL ECHOCARDIOGRAM (TEE) (N/A) TRANSESOPHAGEAL ECHOCARDIOGRAM (TEE) (N/A) -  CV- in SR, BP ok  Co-ox 72 off dopamine- wean milrinone gradually  RESP- IS for bibasilar atelectasis  RENAL_ creatinine up slightly, continue diuresis, lytes OK  Keep Foley to monitor UOP  ENDO- CBG OK- change to AC/HS  Thrombocytopenia- relatively stable, SCD for DVT prophylaxis- no enoxaparin secondary to low PLT   LOS: 6 days    Melrose Nakayama 08/16/2015

## 2015-08-16 NOTE — Progress Notes (Signed)
      [address]       [place] [address]             [phone]      Up in chair  Blood pressure 112/68, pulse 90, temperature 98.5 F (36.9 C), temperature source Oral, resp. rate 19, height 5\' 10"  (1.778 m), weight 199 lb 4.7 oz (90.4 kg), SpO2 99 %.   Intake/Output Summary (Last 24 hours) at 08/16/15 1706 Last data filed at 08/16/15 1500  Gross per 24 hour  Intake  816.4 ml  Output   1780 ml  Net -963.6 ml    Doing well  Remains on milrinone  Remo Lipps C. Roxan Hockey, MD [facility] [phone]

## 2015-08-16 NOTE — Evaluation (Signed)
Physical Therapy Evaluation Patient Details Name: [name] [name] MRN: [id number] DOB: 09/18/1940 Today's Date: 08/16/2015   History of Present Illness  pt is a 75 y/o female with h/o HTN admitted with chest pain.  Cath showed 3 vessel ds.  s/p CABG 1/18.  Clinical Impression  Pt admitted with/for CP with need for CABG post cath.  Pt currently limited functionally due to the problems listed below.  (see problems list.)  Pt will benefit from PT to maximize function and safety to be able to get home safely with available assist of family.     Follow Up Recommendations Home health PT;Supervision/Assistance - 24 hour (may not need HHPT by time of d/c)    Equipment Recommendations  Rolling walker with 5" wheels    Recommendations for Other Services       Precautions / Restrictions Precautions Precautions: Sternal      Mobility  Bed Mobility Overal bed mobility: Needs Assistance Bed Mobility: Supine to Sit     Supine to sit: Min assist     General bed mobility comments: able to scoot to EOB well without assist  Transfers Overall transfer level: Needs assistance   Transfers: Sit to/from Stand Sit to Stand: Min assist         General transfer comment: cues for sternal precautions  Ambulation/Gait Ambulation/Gait assistance: Min guard Ambulation Distance (Feet): 330 Feet Assistive device:  (pushing w/c) Gait Pattern/deviations: Step-through pattern   Gait velocity interpretation: Below normal speed for age/gender General Gait Details: generally steady and slower  Stairs            Wheelchair Mobility    Modified Rankin (Stroke Patients Only)       Balance Overall balance assessment: No apparent balance deficits (not formally assessed)                                           Pertinent Vitals/Pain Pain Assessment: Faces Faces Pain Scale: Hurts a little bit Pain Location: sternal Pain Descriptors / Indicators: Sore Pain  Intervention(s): Monitored during session    Home Living Family/patient expects to be discharged to:: Private residence Living Arrangements: Children (daughter) Available Help at Discharge: Family;Available 24 hours/day (daughter taking FMLA) Type of Home: House Home Access: Stairs to enter Entrance Stairs-Rails: Psychiatric nurse of Steps: several Home Layout: Two level Home Equipment: Grab bars - toilet;Grab bars - tub/shower      Prior Function Level of Independence: Independent         Comments: just retired     Journalist, newspaper        Extremity/Trunk Assessment   Upper Extremity Assessment: Overall WFL for tasks assessed           Lower Extremity Assessment: Overall WFL for tasks assessed (some proximal weakness)         Communication   Communication: No difficulties  Cognition Arousal/Alertness: Awake/alert Behavior During Therapy: WFL for tasks assessed/performed Overall Cognitive Status: Within Functional Limits for tasks assessed                      General Comments General comments (skin integrity, edema, etc.): sats on RA at rest 94%, With ambulation on 2L 94%, later 98% at rest.  EHR 90's    Exercises        Assessment/Plan    PT Assessment Patient needs continued PT services  PT Diagnosis Generalized weakness   PT Problem List Decreased activity tolerance;Decreased mobility;Decreased knowledge of use of DME;Cardiopulmonary status limiting activity;Decreased strength  PT Treatment Interventions DME instruction;Gait training;Stair training;Functional mobility training;Therapeutic activities;Patient/family education   PT Goals (Current goals can be found in the Care Plan section) Acute Rehab PT Goals Patient Stated Goal: home independent PT Goal Formulation: With patient Time For Goal Achievement: 08/30/15 Potential to Achieve Goals: Good    Frequency Min 3X/week   Barriers to discharge        Co-evaluation                End of Session Equipment Utilized During Treatment: Oxygen Activity Tolerance: Patient tolerated treatment well Patient left: in chair;with call bell/phone within reach;with nursing/sitter in room Nurse Communication: Mobility status         Time: [id number] PT Time Calculation (min) (ACUTE ONLY): 33 min   Charges:   PT Evaluation $PT Eval Moderate Complexity: 1 Procedure PT Treatments $Gait Training: 8-22 mins   PT G Codes:        Marlet Korte, Tessie Fass 08/16/2015, 5:26 PM 08/16/2015  Donnella Sham, PT [phone]  (pager)

## 2015-08-17 ENCOUNTER — Inpatient Hospital Stay (HOSPITAL_COMMUNITY): Payer: Medicare PPO

## 2015-08-17 LAB — BASIC METABOLIC PANEL
ANION GAP: 7 (ref 5–15)
BUN: 24 mg/dL — ABNORMAL HIGH (ref 6–20)
CALCIUM: 8.1 mg/dL — AB (ref 8.9–10.3)
CHLORIDE: 106 mmol/L (ref 101–111)
CO2: 28 mmol/L (ref 22–32)
CREATININE: 0.96 mg/dL (ref 0.44–1.00)
GFR calc non Af Amer: 57 mL/min — ABNORMAL LOW (ref >60)
Glucose, Bld: 97 mg/dL (ref 65–99)
Potassium: 3.3 mmol/L — ABNORMAL LOW (ref 3.5–5.1)
SODIUM: 141 mmol/L (ref 135–145)

## 2015-08-17 LAB — TYPE AND SCREEN
ABO/RH(D): O POS
Antibody Screen: NEGATIVE
Unit division: 0
Unit division: 0
Unit division: 0
Unit division: 0
Unit division: 0
Unit division: 0
Unit division: 0
Unit division: 0

## 2015-08-17 LAB — CBC
HCT: 28.6 % — ABNORMAL LOW (ref 36.0–46.0)
HEMOGLOBIN: 9.4 g/dL — AB (ref 12.0–15.0)
MCH: 28.8 pg (ref 26.0–34.0)
MCHC: 32.9 g/dL (ref 30.0–36.0)
MCV: 87.7 fL (ref 78.0–100.0)
Platelets: 68 10*3/uL — ABNORMAL LOW (ref 150–400)
RBC: 3.26 MIL/uL — ABNORMAL LOW (ref 3.87–5.11)
RDW: 15.5 % (ref 11.5–15.5)
WBC: 9.2 10*3/uL (ref 4.0–10.5)

## 2015-08-17 LAB — GLUCOSE, CAPILLARY
GLUCOSE-CAPILLARY: 96 mg/dL (ref 65–99)
GLUCOSE-CAPILLARY: 92 mg/dL (ref 65–99)
GLUCOSE-CAPILLARY: 106 mg/dL — AB (ref 65–99)
Glucose-Capillary: 89 mg/dL (ref 65–99)
Glucose-Capillary: 115 mg/dL — ABNORMAL HIGH (ref 65–99)

## 2015-08-17 LAB — CARBOXYHEMOGLOBIN
Carboxyhemoglobin: 1.6 % — ABNORMAL HIGH (ref 0.5–1.5)
Methemoglobin: 0.9 % (ref 0.0–1.5)
O2 SAT: 65 %
TOTAL HEMOGLOBIN: 9.3 g/dL — AB (ref 12.0–16.0)

## 2015-08-17 LAB — POCT I-STAT 4, (NA,K, GLUC, HGB,HCT)
Glucose, Bld: 102 mg/dL — ABNORMAL HIGH (ref 65–99)
HCT: 27 % — ABNORMAL LOW (ref 36.0–46.0)
HEMOGLOBIN: 9.2 g/dL — AB (ref 12.0–15.0)
POTASSIUM: 3.2 mmol/L — AB (ref 3.5–5.1)
Sodium: 139 mmol/L (ref 135–145)

## 2015-08-17 MED ORDER — POTASSIUM CHLORIDE 10 MEQ/50ML IV SOLN
10.0000 meq | INTRAVENOUS | Status: AC
  Administered 2015-08-17 (×3): 10 meq via INTRAVENOUS
  Filled 2015-08-17 (×2): qty 50

## 2015-08-17 MED ORDER — POTASSIUM CHLORIDE 10 MEQ/50ML IV SOLN
10.0000 meq | INTRAVENOUS | Status: AC
  Administered 2015-08-17 (×3): 10 meq via INTRAVENOUS
  Filled 2015-08-17 (×3): qty 50

## 2015-08-17 MED ORDER — POTASSIUM CHLORIDE 10 MEQ/50ML IV SOLN
10.0000 meq | INTRAVENOUS | Status: AC
  Administered 2015-08-17 (×3): 10 meq via INTRAVENOUS
  Filled 2015-08-17: qty 50

## 2015-08-17 NOTE — Progress Notes (Signed)
K+= 3.3 and creat= 0.96 w/ urine o/p > 30cc/hr; TCTS KCL protocol initiated with 10 mEq KCL in 50cc IV x 3, each over one hour.

## 2015-08-17 NOTE — Progress Notes (Signed)
      [address]       [place] [address]             [phone]      Up in chair  BP 115/71 mmHg  Pulse 96  Temp(Src) 98.6 F (37 C) (Oral)  Resp 24  Ht 5\' 10"  (1.778 m)  Wt 179 lb 7.3 oz (81.4 kg)  BMI 25.75 kg/m2  SpO2 98%   Intake/Output Summary (Last 24 hours) at 08/17/15 1938 Last data filed at 08/17/15 1858  Gross per 24 hour  Intake 1864.8 ml  Output   3155 ml  Net -1290.2 ml    K= 3.3- will replete K IV  Remo Lipps C. Roxan Hockey, MD [facility] [phone]

## 2015-08-17 NOTE — Progress Notes (Signed)
3 Days Post-Op Procedure(s) (LRB): CORONARY ARTERY BYPASS GRAFTING (CABG)X3 LIMA-LAD; SVG-RAMUS; SVG-PD TRANSESOPHAGEAL ECHOCARDIOGRAM (TEE) (N/A) TRANSESOPHAGEAL ECHOCARDIOGRAM (TEE) (N/A) Subjective: Feels better today Appetite improving  Objective: Vital signs in last 24 hours: Temp:  [97.6 F (36.4 C)-98.6 F (37 C)] 98.5 F (36.9 C) (01/22 0800) Pulse Rate:  [80-103] 83 (01/22 0800) Cardiac Rhythm:  [-] Normal sinus rhythm (01/22 0800) Resp:  [16-28] 19 (01/22 0800) BP: (93-133)/(57-74) 112/74 mmHg (01/22 0800) SpO2:  [93 %-100 %] 97 % (01/22 0800) Weight:  [179 lb 7.3 oz (81.4 kg)] 179 lb 7.3 oz (81.4 kg) (01/22 0500)  Hemodynamic parameters for last 24 hours:    Intake/Output from previous day: 01/21 0701 - 01/22 0700 In: [id number] [P.O.:600; I.V.:594; IV Piggyback:100] Out: [id number] [Urine:1575] Intake/Output this shift: Total I/O In: 460 [P.O.:360; IV Piggyback:100] Out: 380 [Urine:380]  General appearance: alert and no distress Neurologic: intact Heart: regular rate and rhythm Lungs: diminished breath sounds bibasilar Abdomen: normal findings: soft, non-tender  Lab Results:  Recent Labs  08/16/15 0443 08/17/15 0353  WBC 11.3* 9.2  HGB 10.0* 9.4*  HCT 29.8* 28.6*  PLT 71* 68*   BMET:  Recent Labs  08/16/15 0443 08/17/15 0353  NA 140 141  K 3.8 3.3*  CL 107 106  CO2 26 28  GLUCOSE 154* 97  BUN 19 24*  CREATININE 1.05* 0.96  CALCIUM 7.9* 8.1*    PT/INR:  Recent Labs  08/14/15 1530  LABPROT 17.5*  INR 1.42   ABG    Component Value Date/Time   PHART 7.458* 08/15/2015 0943   HCO3 26.2* 08/15/2015 0943   TCO2 24 08/15/2015 1651   O2SAT 65.0 08/17/2015 0350   CBG (last 3)   Recent Labs  08/16/15 1300 08/16/15 1725 08/16/15 2153  GLUCAP 162* 96 113*    Assessment/Plan: S/P Procedure(s) (LRB): CORONARY ARTERY BYPASS GRAFTING (CABG)X3 LIMA-LAD; SVG-RAMUS; SVG-PD TRANSESOPHAGEAL ECHOCARDIOGRAM (TEE) (N/A) TRANSESOPHAGEAL ECHOCARDIOGRAM  (TEE) (N/A) -  CV- stable. Co-ox 65 off milrinone  RESP- continue IS for atelectasis  RENAL- creatinine OK, still volume overloaded- continue diuresis  Hypokalemia- supplement K  ENDO- CBG OK  Continue ambulation  Thrombocytopenia- stable, no bleeding issues    LOS: 7 days    Melrose Nakayama 08/17/2015

## 2015-08-18 ENCOUNTER — Inpatient Hospital Stay (HOSPITAL_COMMUNITY): Payer: Medicare PPO

## 2015-08-18 LAB — GLUCOSE, CAPILLARY
GLUCOSE-CAPILLARY: 125 mg/dL — AB (ref 65–99)
GLUCOSE-CAPILLARY: 86 mg/dL (ref 65–99)
Glucose-Capillary: 108 mg/dL — ABNORMAL HIGH (ref 65–99)
Glucose-Capillary: 114 mg/dL — ABNORMAL HIGH (ref 65–99)

## 2015-08-18 LAB — CBC
HEMATOCRIT: 29.6 % — AB (ref 36.0–46.0)
Hemoglobin: 9.6 g/dL — ABNORMAL LOW (ref 12.0–15.0)
MCH: 28.8 pg (ref 26.0–34.0)
MCHC: 32.4 g/dL (ref 30.0–36.0)
MCV: 88.9 fL (ref 78.0–100.0)
PLATELETS: 93 10*3/uL — AB (ref 150–400)
RBC: 3.33 MIL/uL — ABNORMAL LOW (ref 3.87–5.11)
RDW: 15.4 % (ref 11.5–15.5)
WBC: 8.6 10*3/uL (ref 4.0–10.5)

## 2015-08-18 LAB — BASIC METABOLIC PANEL
Anion gap: 7 (ref 5–15)
BUN: 19 mg/dL (ref 6–20)
CALCIUM: 8.1 mg/dL — AB (ref 8.9–10.3)
CO2: 27 mmol/L (ref 22–32)
CREATININE: 0.86 mg/dL (ref 0.44–1.00)
Chloride: 105 mmol/L (ref 101–111)
GFR calc Af Amer: >60 mL/min (ref >60)
GLUCOSE: 111 mg/dL — AB (ref 65–99)
Potassium: 3.8 mmol/L (ref 3.5–5.1)
Sodium: 139 mmol/L (ref 135–145)

## 2015-08-18 MED ORDER — FUROSEMIDE 40 MG PO TABS
40.0000 mg | ORAL_TABLET | Freq: Every day | ORAL | Status: DC
  Administered 2015-08-19 – 2015-08-22 (×4): 40 mg via ORAL
  Filled 2015-08-18 (×4): qty 1

## 2015-08-18 MED ORDER — POTASSIUM CHLORIDE 10 MEQ/50ML IV SOLN
10.0000 meq | INTRAVENOUS | Status: AC
  Administered 2015-08-18 (×2): 10 meq via INTRAVENOUS
  Filled 2015-08-18 (×2): qty 50

## 2015-08-18 MED ORDER — METOLAZONE 5 MG PO TABS
5.0000 mg | ORAL_TABLET | Freq: Every day | ORAL | Status: AC
  Administered 2015-08-18: 5 mg via ORAL
  Filled 2015-08-18: qty 1

## 2015-08-18 NOTE — Progress Notes (Signed)
Patient ID: [name] [name], female   DOB: Oct 13, 1940, 75 y.o.   MRN: [id number]  SICU Evening Rounds  Hemodynamically stable in sinus rhythm  Diuresing well  Ambulated today  No problems tonight

## 2015-08-18 NOTE — Op Note (Signed)
NAME:  [name] [name], [name] [name] NO.:  MEDICAL RECORD NO.:  [id number]  LOCATION:                                 FACILITY:  PHYSICIAN:  [name], M.D.  DATE OF BIRTH:  05/24/1941  DATE OF PROCEDURE:  08/14/2015 DATE OF DISCHARGE:                              OPERATIVE REPORT   OPERATION: 1. Coronary artery bypass grafting x3 (left internal mammary artery to     LAD, saphenous vein graft to ramus intermediate, saphenous vein     graft to posterior descending). 2. Endoscopic harvest of right leg greater saphenous vein.  SURGEON:  [name], M.D.  ASSISTANT:  Jadene Pierini, PA-C.  ANESTHESIA:  General by Dr. Annye Asa, M.D.  PREOPERATIVE DIAGNOSIS:  Post myocardial infarction unstable angina, severe multivessel coronary artery disease.  POSTOPERATIVE DIAGNOSIS:  Post myocardial infarction unstable angina, severe multivessel coronary artery disease.  INDICATIONS:  The patient is an obese 75 year old patient, presented with symptoms of unstable angina and positive cardiac enzymes.  Cardiac catheterization by Dr. Irish Lack demonstrated three-vessel coronary disease.  The posterior descending had the tightest lesions, and this was treated with PTCA.  The patient was given anticoagulation, and her chest pain resolved.  She was felt to be a candidate for multivessel coronary artery bypass grafting based on her coronary anatomy and recent myocardial infarction.  After being asked to evaluate the patient for CABG, I examined the patient in the CCU and reviewed the results of the cardiac catheterization with the patient and her family.  I discussed the indications and expected benefits of coronary artery bypass surgery for treatment of her severe coronary disease.  I reviewed the alternatives to surgical therapy as well.  I discussed the potential risks to her of coronary artery bypass grafting including the risks of stroke, myocardial infarction,  bleeding, blood transfusion requirement, postoperative pulmonary problems including pleural effusion, infection, and death.  After reviewing these issues, she demonstrated her understanding and agreed to proceed with surgery under what I felt was an informed consent.  We also discussed the low risk of atypical mycobacterial wound infection, associated with the heater-cooler equipment which has been experienced in other centers but not in ours.  OPERATIVE PROCEDURE:  The patient was brought to the operating room, placed supine on the operating table.  General anesthesia was induced. The chest, abdomen, and legs were prepped with Betadine and draped as a sterile field.  A transesophageal echo probe was placed by the anesthesiologist.  There was mild-moderate aortic insufficiency.  A sternal incision was made as the vein was being harvested from the right leg using endoscopic technique.  Prior to sternotomy, bleeding from the soft tissue in the neck and the upper part of the sternal incision was controlled with direct pressure.  A sternal incision was then made.  While holding pressure over the venous blood source in the upper neck, sutures of 2-0 Ethibond with pledgets were placed which controlled the bleeding.  The patient required Cell Saver transfusion and packed cell transfusion from the blood bank.  The vital signs remained stable.  A sternal retractor was placed, and the pericardium was  opened.  The chest wall retractor was placed, and the internal mammary artery was then harvested as a pedicle graft from its origin at the subclavian vessels.  It was a good vessel, although small, but with excellent flow.  The sternal retractor was replaced, and the pericardium was suspended.  Pursestrings were placed in the ascending aorta and right atrium, and heparin was administered.  The patient was then cannulated and placed on cardiopulmonary bypass.  The coronaries were identified for  grafting.  The LAD, ramus, and the posterior ascending were found to be adequate targets.  The posterior descending was a small target with distal disease.  The LAD was deeply into myocardium, and the distal anastomosis was placed at the junction between the second diagonal and the LAD.  The vein conduit was of adequate quality.  The patient was placed on cardiopulmonary bypass.  Cardioplegia cannulas were placed for both antegrade aortic, retrograde coronary sinus cardioplegia.  The mammary artery and vein grafts were prepared for the distal anastomoses.  The patient was then cooled to 32 degrees.  The aortic crossclamp was applied, and one liter of cold blood cardioplegia was delivered in split doses between the antegrade aortic and retrograde coronary sinus catheters.  There was evidence of mild aortic insufficiency upon delivering the cardioplegia to the aortic root.  There was good cardioplegic arrest, and septal temperature dropped less than 12 degrees.  Cardioplegia was delivered every 20 minutes or less.  The distal coronary anastomoses were performed.  The first distal anastomosis was the posterior descending which had a 90% stenosis in the mid vessel.  A reverse saphenous vein was sewn end-to-side with running 8-0 Prolene.  There was good flow through the graft.  Cardioplegia was redosed.  A second distal anastomosis was the ramus intermediate branch of the left coronary artery.  This was a large 1.5 mm vessel with approximately 80%-90% stenosis.  A reverse saphenous vein was sewn end-to-side with running 7-0 Prolene, with good flow through the graft.  This was an intramyocardial vessel.  The third distal anastomosis was to the distal third of the LAD.  It was deeply into myocardium.  It had a proximal 95% stenosis.  The left IMA pedicle was brought through an opening, and the left lateral pericardium was brought onto the LAD-diagonal junction.  The probe passed  proximally into the LAD freely which was deeply into myocardium.  The left IMA was then sewn to the LAD-diagonal junction with a running 8-0 Prolene. There was good flow through the graft.  The bulldog was briefly released to assess flow and then reapplied, and the pedicle was secured to the epicardium with 6-0 Prolene.  Cardioplegia was redosed.  With the cross-clamp still in place, two proximal vein anastomoses were performed on the ascending aorta using a 4.5 mm punch running 6-0 Prolene.  Prior to tying down the final proximal anastomosis, air was vented from the coronaries with a dose of retrograde warm blood cardioplegia.  The cross-clamp was removed.  The vein grafts were de-aired and opened and each had a good flow.  The heart resumed with a spontaneous rhythm.  Hemostasis was documented in the proximal and distal anastomoses.  The patient was rewarmed and reperfused.  Temporary pacing wires were applied.  The lungs were expanded.  The ventilator was resumed.  The patient was then weaned off cardiopulmonary bypass without difficulty.  Cardiac output and hemodynamics were stable.  Protamine was administered.  After reversal of heparin with protamine, there was  still diffuse bleeding, and coagulation panel demonstrated low platelet count, low fibrinogen; and the patient was subsequently given platelets and FFP and cryoprecipitate with improved coagulation function so that the chest could be closed. The superior pericardial fat was closed over the aorta.  The site of the bleeding and the neck vein was dry.  Anterior mediastinal and bilateral pleural tubes were placed and brought through separate incisions.  The sternum was closed with interrupted steel wire.  The pectoralis fascia was closed with a running #1 Vicryl.  The subcutaneous and skin layers were closed in running Vicryl, and sterile dressings were applied.  Total cardiopulmonary bypass time was 130  minutes.     [name], M.D.     PV/MEDQ  D:  08/16/2015  T:  08/16/2015  Job:  [id number]

## 2015-08-18 NOTE — Care Management Important Message (Signed)
Important Message  Patient Details  Name: TEMPY ALCINDOR MRN: [id number] Date of Birth: 23-Jan-1941   Medicare Important Message Given:  Yes    Elvera Almario P [place] 08/18/2015, 2:10 PM

## 2015-08-18 NOTE — Progress Notes (Signed)
4 Days Post-Op Procedure(s) (LRB): CORONARY ARTERY BYPASS GRAFTING (CABG)X3 LIMA-LAD; SVG-RAMUS; SVG-PD TRANSESOPHAGEAL ECHOCARDIOGRAM (TEE) (N/A) TRANSESOPHAGEAL ECHOCARDIOGRAM (TEE) (N/A) Subjective: Progressing well nsr diuresing Objective: Vital signs in last 24 hours: Temp:  [97.9 F (36.6 C)-98.9 F (37.2 C)] 98.5 F (36.9 C) (01/23 0736) Pulse Rate:  [74-100] 88 (01/23 1038) Cardiac Rhythm:  [-] Normal sinus rhythm (01/23 0848) Resp:  [16-27] 20 (01/23 1000) BP: (94-142)/(65-92) 117/69 mmHg (01/23 1038) SpO2:  [92 %-99 %] 95 % (01/23 1000) Weight:  [189 lb 9.5 oz (86 kg)] 189 lb 9.5 oz (86 kg) (01/23 0600)  Hemodynamic parameters for last 24 hours:  stable  Intake/Output from previous day: 01/22 0701 - 01/23 0700 In: 1740 [P.O.:960; I.V.:380; IV Piggyback:400] Out: 3210 [Urine:3210] Intake/Output this shift: Total I/O In: 420 [P.O.:320; IV Piggyback:100] Out: 710 [Urine:710]  Lungs clear Incisions clean Mild edema  Lab Results:  Recent Labs  08/17/15 0353 08/17/15 1641 08/18/15 0511  WBC 9.2  --  8.6  HGB 9.4* 9.2* 9.6*  HCT 28.6* 27.0* 29.6*  PLT 68*  --  93*   BMET:  Recent Labs  08/17/15 0353 08/17/15 1641 08/18/15 0511  NA 141 139 139  K 3.3* 3.2* 3.8  CL 106  --  105  CO2 28  --  27  GLUCOSE 97 102* 111*  BUN 24*  --  19  CREATININE 0.96  --  0.86  CALCIUM 8.1*  --  8.1*    PT/INR: No results for input(s): LABPROT, INR in the last 72 hours. ABG    Component Value Date/Time   PHART 7.458* 08/15/2015 0943   HCO3 26.2* 08/15/2015 0943   TCO2 24 08/15/2015 1651   O2SAT 65.0 08/17/2015 0350   CBG (last 3)   Recent Labs  08/17/15 1555 08/17/15 2138 08/18/15 0734  GLUCAP 106* 115* 108*    Assessment/Plan: S/P Procedure(s) (LRB): CORONARY ARTERY BYPASS GRAFTING (CABG)X3 LIMA-LAD; SVG-RAMUS; SVG-PD TRANSESOPHAGEAL ECHOCARDIOGRAM (TEE) (N/A) TRANSESOPHAGEAL ECHOCARDIOGRAM (TEE) (N/A) Mobilize Diuresis Diabetes control Plan for  transfer to step-down: see transfer orders   LOS: 8 days    [name] III 08/18/2015

## 2015-08-18 NOTE — Plan of Care (Signed)
Problem: Activity: Goal: Risk for activity intolerance will decrease Outcome: Completed/Met Date Met:  08/18/15 Pt demonstrates tolerance to increased activity, ambulating 3 times per day, OOB to chair for meals and adequate rest at night. Demonstrates good use of heart pillow for splinting.  Problem: Bowel/Gastric: Goal: Gastrointestinal status for postoperative course will improve Outcome: Completed/Met Date Met:  08/18/15 Pt diet advanced to carb. Modified, tolerating increased diet with 1st BM 08-17-15.  Problem: Respiratory: Goal: Respiratory status will improve Outcome: Completed/Met Date Met:  08/18/15 Pt able to wean to room air 08-17-15, demostrating proper pulm. Toilet.

## 2015-08-18 NOTE — Progress Notes (Signed)
Physical Therapy Treatment Patient Details Name: ULLA DYSINGER MRN: [id number] DOB: 1940/12/28 Today's Date: 08/18/2015    History of Present Illness pt is a 75 y/o female with h/o HTN admitted with chest pain.  Cath showed 3 vessel ds.  s/p CABG 1/18.    PT Comments    Progressing well with mobility.  Pt scooting to edge of chair and standing without use of UE.  Will emphasize bed mobility and stairs next session.  Follow Up Recommendations  Supervision/Assistance - 24 hour;Home health PT (at d/c pt may have progressed past need for HHPT)     Equipment Recommendations  Rolling walker with 5" wheels    Recommendations for Other Services       Precautions / Restrictions Precautions Precautions: Sternal Restrictions Weight Bearing Restrictions: No (Sternal precautions)    Mobility  Bed Mobility                  Transfers Overall transfer level: Needs assistance Equipment used: Rolling walker (2 wheeled) Transfers: Sit to/from Stand Sit to Stand: Min assist         General transfer comment: cues for sternal precautions  Ambulation/Gait Ambulation/Gait assistance: Min guard Ambulation Distance (Feet): 380 Feet Assistive device: Rolling walker (2 wheeled) Gait Pattern/deviations: Step-through pattern     General Gait Details: steady with a faster speed,  SpO2 stayed 94-98% and EHR 99-115 bpm   Stairs            Wheelchair Mobility    Modified Rankin (Stroke Patients Only)       Balance Overall balance assessment: Needs assistance   Sitting balance-Leahy Scale: Good     Standing balance support: No upper extremity supported Standing balance-Leahy Scale: Fair                      Cognition Arousal/Alertness: Awake/alert Behavior During Therapy: WFL for tasks assessed/performed Overall Cognitive Status: Within Functional Limits for tasks assessed                      Exercises      General Comments         Pertinent Vitals/Pain Pain Assessment: No/denies pain    Home Living                      Prior Function            PT Goals (current goals can now be found in the care plan section) Acute Rehab PT Goals Patient Stated Goal: home independent PT Goal Formulation: With patient Time For Goal Achievement: 08/30/15 Potential to Achieve Goals: Good Progress towards PT goals: Progressing toward goals    Frequency  Min 3X/week    PT Plan Current plan remains appropriate    Co-evaluation             End of Session   Activity Tolerance: Patient tolerated treatment well Patient left: in chair;with call bell/phone within reach;with family/visitor present     Time: 1131-1146 PT Time Calculation (min) (ACUTE ONLY): 15 min  Charges:  $Gait Training: 8-22 mins                    G Codes:      Chelby Salata, Tessie Fass 08/18/2015, 11:53 AM 08/18/2015  Donnella Sham, PT [phone]  (pager)

## 2015-08-19 ENCOUNTER — Inpatient Hospital Stay (HOSPITAL_COMMUNITY): Payer: Medicare PPO

## 2015-08-19 LAB — CBC
HCT: 32.5 % — ABNORMAL LOW (ref 36.0–46.0)
Hemoglobin: 10.4 g/dL — ABNORMAL LOW (ref 12.0–15.0)
MCH: 28 pg (ref 26.0–34.0)
MCHC: 32 g/dL (ref 30.0–36.0)
MCV: 87.4 fL (ref 78.0–100.0)
Platelets: 131 10*3/uL — ABNORMAL LOW (ref 150–400)
RBC: 3.72 MIL/uL — ABNORMAL LOW (ref 3.87–5.11)
RDW: 14.8 % (ref 11.5–15.5)
WBC: 6.9 10*3/uL (ref 4.0–10.5)

## 2015-08-19 LAB — GLUCOSE, CAPILLARY
GLUCOSE-CAPILLARY: 123 mg/dL — AB (ref 65–99)
GLUCOSE-CAPILLARY: 134 mg/dL — AB (ref 65–99)
Glucose-Capillary: 97 mg/dL (ref 65–99)
Glucose-Capillary: 109 mg/dL — ABNORMAL HIGH (ref 65–99)

## 2015-08-19 LAB — COMPREHENSIVE METABOLIC PANEL
ALT: 18 U/L (ref 14–54)
AST: 17 U/L (ref 15–41)
Albumin: 2.2 g/dL — ABNORMAL LOW (ref 3.5–5.0)
Alkaline Phosphatase: 108 U/L (ref 38–126)
Anion gap: 12 (ref 5–15)
BUN: 20 mg/dL (ref 6–20)
CO2: 29 mmol/L (ref 22–32)
Calcium: 8.8 mg/dL — ABNORMAL LOW (ref 8.9–10.3)
Chloride: 96 mmol/L — ABNORMAL LOW (ref 101–111)
Creatinine, Ser: 0.9 mg/dL (ref 0.44–1.00)
GFR calc Af Amer: >60 mL/min (ref >60)
GFR calc non Af Amer: >60 mL/min (ref >60)
Glucose, Bld: 121 mg/dL — ABNORMAL HIGH (ref 65–99)
Potassium: 3.1 mmol/L — ABNORMAL LOW (ref 3.5–5.1)
Sodium: 137 mmol/L (ref 135–145)
Total Bilirubin: 0.8 mg/dL (ref 0.3–1.2)
Total Protein: 5.5 g/dL — ABNORMAL LOW (ref 6.5–8.1)

## 2015-08-19 MED ORDER — POTASSIUM CHLORIDE CRYS ER 20 MEQ PO TBCR
20.0000 meq | EXTENDED_RELEASE_TABLET | Freq: Two times a day (BID) | ORAL | Status: DC
Start: 2015-08-19 — End: 2015-08-22
  Administered 2015-08-19 – 2015-08-22 (×7): 20 meq via ORAL
  Filled 2015-08-19 (×7): qty 1

## 2015-08-19 MED ORDER — ACETAMINOPHEN 500 MG PO TABS
1000.0000 mg | ORAL_TABLET | Freq: Four times a day (QID) | ORAL | Status: AC
  Administered 2015-08-19: 1000 mg via ORAL
  Filled 2015-08-19: qty 2

## 2015-08-19 MED ORDER — OXYCODONE HCL 5 MG PO TABS: 5.0000 mg | ORAL_TABLET | ORAL | Status: DC | PRN

## 2015-08-19 MED ORDER — POTASSIUM CHLORIDE CRYS ER 20 MEQ PO TBCR
40.0000 meq | EXTENDED_RELEASE_TABLET | Freq: Once | ORAL | Status: AC
  Administered 2015-08-19: 40 meq via ORAL
  Filled 2015-08-19: qty 2

## 2015-08-19 MED ORDER — ACETAMINOPHEN 160 MG/5ML PO SOLN: 1000.0000 mg | Freq: Four times a day (QID) | ORAL | Status: AC

## 2015-08-19 NOTE — Progress Notes (Signed)
Physical Therapy Treatment Patient Details Name: [name] [name] MRN: [id number] DOB: March 31, 1941 Today's Date: 08/19/2015    History of Present Illness pt is a 75 y/o female with h/o HTN admitted with chest pain.  Cath showed 3 vessel ds.  s/p CABG 1/18.    PT Comments    Progressing well.  Able to complete bed mobility, transitions/transfers and gait without any assistance or AD.  She still does not feel confident without an AD and would like a RW.  Follow Up Recommendations  Supervision/Assistance - 24 hour;Home health PT     Equipment Recommendations  Rolling walker with 5" wheels    Recommendations for Other Services       Precautions / Restrictions Precautions Precautions: Sternal    Mobility  Bed Mobility Overal bed mobility: Needs Assistance Bed Mobility: Sidelying to Sit;Sit to Supine   Sidelying to sit: Supervision   Sit to supine: Supervision   General bed mobility comments: Practiced building some momentum to come up without using UE's  Transfers Overall transfer level: Needs assistance Equipment used: Rolling walker (2 wheeled);None Transfers: Sit to/from Stand Sit to Stand: Min guard         General transfer comment: cues for sternal precautions  Ambulation/Gait Ambulation/Gait assistance: Min guard Ambulation Distance (Feet): 280 Feet Assistive device: None Gait Pattern/deviations: Step-through pattern     General Gait Details: steady but guarded with little arm swing.  Generally slow   Stairs            Wheelchair Mobility    Modified Rankin (Stroke Patients Only)       Balance   Sitting-balance support: No upper extremity supported Sitting balance-Leahy Scale: Good     Standing balance support: No upper extremity supported Standing balance-Leahy Scale: Good                      Cognition Arousal/Alertness: Awake/alert Behavior During Therapy: WFL for tasks assessed/performed Overall Cognitive Status: Within  Functional Limits for tasks assessed                      Exercises      General Comments        Pertinent Vitals/Pain Pain Assessment: 0-10 Pain Score: 5  Pain Location: sternal Pain Descriptors / Indicators: Aching;Sore Pain Intervention(s): Monitored during session    Home Living                      Prior Function            PT Goals (current goals can now be found in the care plan section) Acute Rehab PT Goals Patient Stated Goal: home independent PT Goal Formulation: With patient Time For Goal Achievement: 08/30/15 Potential to Achieve Goals: Good Progress towards PT goals: Progressing toward goals    Frequency  Min 3X/week    PT Plan Current plan remains appropriate    Co-evaluation             End of Session   Activity Tolerance: Patient tolerated treatment well Patient left: with call bell/phone within reach;with family/visitor present;in bed (sitting EOB)     Time: [id number] PT Time Calculation (min) (ACUTE ONLY): 21 min  Charges:  $Gait Training: 8-22 mins                    G Codes:      Jais Demir, Tessie Fass 08/19/2015, 4:14 PM 08/19/2015  Donnella Sham, PT [phone]  [phone]  (pager)

## 2015-08-19 NOTE — Progress Notes (Signed)
Utilization review completed.  

## 2015-08-19 NOTE — Clinical Documentation Improvement (Signed)
Cardiothoracic  Abnormal Lab/Test Results:  H/H: 01/15:  13.0/40.2 01/18:  11.7/36.6 01/22:    9.2/27.0  Possible Clinical Conditions associated with below indicators  Acute Blood Loss Anemia  Other Condition  Cannot Clinically Determine   Treatment Provided: Per 01/19 Anesthesia record, received: 1820 ml of cell saver; 410 ml of cryoprecip; 856 ml of FFP; and 1640 ml of PRBC.   Please exercise your independent, professional judgment when responding. A specific answer is not anticipated or expected.   Thank You,  [place] [phone]

## 2015-08-19 NOTE — Progress Notes (Signed)
Patient arrived on the unit alert and oriented X4. No c/o pain or discomfort. Orientation to room and unit completed. Will continue to monitor

## 2015-08-19 NOTE — Progress Notes (Signed)
CARDIAC REHAB PHASE I   PRE:  Rate/Rhythm: 83 SR  BP:  Supine: 110/78  Sitting:   Standing:    SaO2: 90%RA  MODE:  Ambulation: 200 ft   POST:  Rate/Rhythm: 102 ST  BP:  Supine:   Sitting: 120/80  Standing:    SaO2: 91%RA 1200-1230 Assisted pt to bathroom and then walked 200 ft on RA with rolling walker. Stated had walked earlier and feeling tired. PT to see later today. To recliner with call bell. Tolerated well.   Graylon Good, RN BSN  08/19/2015 12:28 PM

## 2015-08-19 NOTE — Anesthesia Postprocedure Evaluation (Signed)
Anesthesia Post Note  Patient: [name] [name]  Procedure(s) Performed: Procedure(s) (LRB): CORONARY ARTERY BYPASS GRAFTING (CABG)X3 LIMA-LAD; SVG-RAMUS; SVG-PD TRANSESOPHAGEAL ECHOCARDIOGRAM (TEE) (N/A) TRANSESOPHAGEAL ECHOCARDIOGRAM (TEE) (N/A)  Patient location during evaluation: Nursing Unit Anesthesia Type: General Level of consciousness: oriented, awake and alert and patient cooperative Pain management: pain level controlled Vital Signs Assessment: vitals unstable and post-procedure vital signs reviewed and stable Respiratory status: spontaneous breathing and respiratory function stable Cardiovascular status: blood pressure returned to baseline and stable Anesthetic complications: no Comments: Pt ambulating daily, doing well      Last Vitals:  Filed Vitals:   08/19/15 0835 08/19/15 1033  BP: 108/59 126/67  Pulse: 81 73  Temp:  36.7 C  Resp: 20 18    Last Pain:  Filed Vitals:   08/19/15 1304  PainSc: 0-No pain                 Peace Jost,E. Bonni Neuser

## 2015-08-19 NOTE — Progress Notes (Deleted)
Utilization review completed.  

## 2015-08-20 LAB — BASIC METABOLIC PANEL
Anion gap: 9 (ref 5–15)
BUN: 16 mg/dL (ref 6–20)
CO2: 27 mmol/L (ref 22–32)
Calcium: 8.1 mg/dL — ABNORMAL LOW (ref 8.9–10.3)
Chloride: 92 mmol/L — ABNORMAL LOW (ref 101–111)
Creatinine, Ser: 0.82 mg/dL (ref 0.44–1.00)
GFR calc Af Amer: >60 mL/min (ref >60)
GFR calc non Af Amer: >60 mL/min (ref >60)
Glucose, Bld: 129 mg/dL — ABNORMAL HIGH (ref 65–99)
Potassium: 3.1 mmol/L — ABNORMAL LOW (ref 3.5–5.1)
Sodium: 128 mmol/L — ABNORMAL LOW (ref 135–145)

## 2015-08-20 LAB — GLUCOSE, CAPILLARY
GLUCOSE-CAPILLARY: 107 mg/dL — AB (ref 65–99)
GLUCOSE-CAPILLARY: 115 mg/dL — AB (ref 65–99)
GLUCOSE-CAPILLARY: 124 mg/dL — AB (ref 65–99)
Glucose-Capillary: 122 mg/dL — ABNORMAL HIGH (ref 65–99)

## 2015-08-20 MED ORDER — POTASSIUM CHLORIDE CRYS ER 20 MEQ PO TBCR
40.0000 meq | EXTENDED_RELEASE_TABLET | Freq: Once | ORAL | Status: AC
  Administered 2015-08-20: 40 meq via ORAL
  Filled 2015-08-20: qty 2

## 2015-08-20 MED ORDER — METOPROLOL TARTRATE 25 MG PO TABS
25.0000 mg | ORAL_TABLET | Freq: Two times a day (BID) | ORAL | Status: DC
  Administered 2015-08-20 (×2): 25 mg via ORAL
  Filled 2015-08-20 (×2): qty 1

## 2015-08-20 MED ORDER — SORBITOL 70 % PO SOLN
30.0000 mL | Freq: Once | ORAL | Status: AC
  Administered 2015-08-20: 30 mL via ORAL
  Filled 2015-08-20: qty 30

## 2015-08-20 MED ORDER — MAGNESIUM HYDROXIDE 400 MG/5ML PO SUSP: 30.0000 mL | Freq: Every day | ORAL | Status: DC | PRN

## 2015-08-20 MED ORDER — METOPROLOL TARTRATE 25 MG/10 ML ORAL SUSPENSION
12.5000 mg | Freq: Two times a day (BID) | ORAL | Status: DC
  Filled 2015-08-20: qty 10

## 2015-08-20 MED ORDER — ENOXAPARIN SODIUM 40 MG/0.4ML ~~LOC~~ SOLN
40.0000 mg | SUBCUTANEOUS | Status: DC
  Administered 2015-08-20: 40 mg via SUBCUTANEOUS
  Filled 2015-08-20: qty 0.4

## 2015-08-20 MED ORDER — METOLAZONE 5 MG PO TABS
5.0000 mg | ORAL_TABLET | Freq: Every day | ORAL | Status: AC
  Administered 2015-08-20 – 2015-08-21 (×2): 5 mg via ORAL
  Filled 2015-08-20 (×2): qty 1

## 2015-08-20 NOTE — Progress Notes (Addendum)
[address]       [employer] [id number]             [phone]      6 Days Post-Op Procedure(s) (LRB): CORONARY ARTERY BYPASS GRAFTING (CABG)X3 LIMA-LAD; SVG-RAMUS; SVG-PD TRANSESOPHAGEAL ECHOCARDIOGRAM (TEE) (N/A) TRANSESOPHAGEAL ECHOCARDIOGRAM (TEE) (N/A) Subjective: Feels fair, weak with some mild constipation  Objective: Vital signs in last 24 hours: Temp:  [98 F (36.7 C)-98.6 F (37 C)] 98.6 F (37 C) (01/25 0514) Pulse Rate:  [73-98] 92 (01/25 0514) Cardiac Rhythm:  [-] Normal sinus rhythm (01/25 0742) Resp:  [18-20] 18 (01/25 0514) BP: (108-129)/(59-69) 129/69 mmHg (01/25 0514) SpO2:  [94 %-98 %] 95 % (01/25 0514) Weight:  [192 lb 4.8 oz (87.227 kg)] 192 lb 4.8 oz (87.227 kg) (01/25 0514)  Hemodynamic parameters for last 24 hours:    Intake/Output from previous day: 01/24 0701 - 01/25 0700 In: 480 [P.O.:480] Out: 300 [Urine:300] Intake/Output this shift:    General appearance: alert, cooperative and no distress Heart: regular rate and rhythm and tachy Lungs: clear to auscultation bilaterally Abdomen: benign Extremities: some LE edema Wound: incis healing well  Lab Results:  Recent Labs  08/18/15 0511 08/19/15 0315  WBC 8.6 6.9  HGB 9.6* 10.4*  HCT 29.6* 32.5*  PLT 93* 131*   BMET:  Recent Labs  08/19/15 0315 08/20/15 0238  NA 137 128*  K 3.1* 3.1*  CL 96* 92*  CO2 29 27  GLUCOSE 121* 129*  BUN 20 16  CREATININE 0.90 0.82  CALCIUM 8.8* 8.1*    PT/INR: No results for input(s): LABPROT, INR in the last 72 hours. ABG    Component Value Date/Time   PHART 7.458* 08/15/2015 0943   HCO3 26.2* 08/15/2015 0943   TCO2 24 08/15/2015 1651   O2SAT 65.0 08/17/2015 0350   CBG (last 3)   Recent Labs  08/19/15 1613 08/19/15 2122 08/20/15 0632  GLUCAP 97 109* 107*    Meds Scheduled Meds: . sodium chloride   Intravenous Once  . sodium chloride   Intravenous Once  . antiseptic oral rinse  7 mL Mouth Rinse BID  .  aspirin EC  325 mg Oral Daily   Or  . aspirin  324 mg Per Tube Daily  . atorvastatin  80 mg Oral q1800  . bisacodyl  10 mg Oral Daily   Or  . bisacodyl  10 mg Rectal Daily  . docusate sodium  200 mg Oral Daily  . furosemide  40 mg Oral Daily  . insulin aspart  0-15 Units Subcutaneous TID WC  . metoprolol tartrate  12.5 mg Oral BID   Or  . metoprolol tartrate  12.5 mg Per Tube BID  . pantoprazole  40 mg Oral Daily  . potassium chloride  20 mEq Oral BID  . sodium chloride  10-40 mL Intracatheter Q12H  . sodium chloride  3 mL Intravenous Q12H   Continuous Infusions: . sodium chloride Stopped (08/15/15 1300)  . sodium chloride Stopped (08/15/15 1500)  . sodium chloride     PRN Meds:.sodium chloride, metoprolol, ondansetron (ZOFRAN) IV, oxyCODONE, sodium chloride, sodium chloride, traMADol  Xrays Dg Chest 2 View  08/19/2015  CLINICAL DATA:  Status post coronary artery bypass grafting. Chest pain. EXAM: CHEST  2 VIEW COMPARISON:  August 18, 2015 ; August 17, 2015 FINDINGS: There is persistent atelectasis in each upper lobe and left base regions without change. No new opacity. Heart is upper normal in size with pulmonary  vascularity within normal limits. No pneumothorax. No adenopathy. There is degenerative change in the lower thoracic spine. IMPRESSION: Stable areas of atelectatic change bilaterally. No new opacity. No pneumothorax. No change in cardiac silhouette. Electronically Signed   By: Lowella Grip III M.D.   On: 08/19/2015 07:44    Assessment/Plan: S/P Procedure(s) (LRB): CORONARY ARTERY BYPASS GRAFTING (CABG)X3 LIMA-LAD; SVG-RAMUS; SVG-PD TRANSESOPHAGEAL ECHOCARDIOGRAM (TEE) (N/A) TRANSESOPHAGEAL ECHOCARDIOGRAM (TEE) (N/A)  1 doing well overall but deconditioned and somewhat weak 2 tachy at times- will increase beta blocker some 3 cont diuresis, monitor sodium closely as some acute hyponatremia 4 replace K+ 5 MOM for constipation prn 6 push rehab as able  LOS: 10  days    GOLD,WAYNE E 08/20/2015  With fluid retention-continue oral diuretics Complains of some shortness of breath with oxygen saturation 95%. Breath sounds slighlyt diminished  at the bases and mild atelectasis on x-ray patient examined and medical record reviewed,agree with above note. [name] III 08/20/2015

## 2015-08-20 NOTE — Discharge Instructions (Signed)
Endoscopic Saphenous Vein Harvesting, Care After °Refer to this sheet in the next few weeks. These instructions provide you with information on caring for yourself after your procedure. Your health care provider may also give you more specific instructions. Your treatment has been planned according to current medical practices, but problems sometimes occur. Call your health care provider if you have any problems or questions after your procedure. °HOME CARE INSTRUCTIONS °Medicine °· Take whatever pain medicine your surgeon prescribes. Follow the directions carefully. Do not take over-the-counter pain medicine unless your surgeon says it is okay. Some pain medicine can cause bleeding problems for several weeks after surgery. °· Follow your surgeon's instructions about driving. You will probably not be permitted to drive after heart surgery. °· Take any medicines your surgeon prescribes. Any medicines you took before your heart surgery should be checked with your health care provider before you start taking them again. °Wound care °· If your surgeon has prescribed an elastic bandage or stocking, ask how long you should wear it. °· Check the area around your surgical cuts (incisions) whenever your bandages (dressings) are changed. Look for any redness or swelling. °· You will need to return to have the stitches (sutures) or staples taken out. Ask your surgeon when to do that. °· Ask your surgeon when you can shower or bathe. °Activity °· Try to keep your legs raised when you are sitting. °· Do any exercises your health care providers have given you. These may include deep breathing exercises, coughing, walking, or other exercises. °SEEK MEDICAL CARE IF: °· You have any questions about your medicines. °· You have more leg pain, especially if your pain medicine stops working. °· New or growing bruises develop on your leg. °· Your leg swells, feels tight, or becomes red. °· You have numbness in your leg. °SEEK IMMEDIATE  MEDICAL CARE IF: °· Your pain gets much worse. °· Blood or fluid leaks from any of the incisions. °· Your incisions become warm, swollen, or red. °· You have chest pain. °· You have trouble breathing. °· You have a fever. °· You have more pain near your leg incision. °MAKE SURE YOU: °· Understand these instructions. °· Will watch your condition. °· Will get help right away if you are not doing well or get worse. °  °This information is not intended to replace advice given to you by your health care provider. Make sure you discuss any questions you have with your health care provider. °  °Document Released: 03/24/2011 Document Revised: 08/02/2014 Document Reviewed: 03/24/2011 °Elsevier Interactive Patient Education ©2016 [employer] °Coronary Artery Bypass Grafting, Care After °These instructions give you information on caring for yourself after your procedure. Your doctor may also give you more specific instructions. Call your doctor if you have any problems or questions after your procedure.  °HOME CARE °· Only take medicine as told by your doctor. Take medicines exactly as told. Do not stop taking medicines or start any new medicines without talking to your doctor first. °· Take your pulse as told by your doctor. °· Do deep breathing as told by your doctor. Use your breathing device (incentive spirometer), if given, to practice deep breathing several times a day. Support your chest with a pillow or your arms when you take deep breaths or cough. °· Keep the area clean, dry, and protected where the surgery cuts (incisions) were made. Remove bandages (dressings) only as told by your doctor. If strips were applied to surgical area, do not take   them off. They fall off on their own. °· Check the surgery area daily for puffiness (swelling), redness, or leaking fluid. °· If surgery cuts were made in your legs: °· Avoid crossing your legs. °· Avoid sitting for long periods of time. Change positions every 30  minutes. °· Raise your legs when you are sitting. Place them on pillows. °· Wear stockings that help keep blood clots from forming in your legs (compression stockings). °· Only take sponge baths until your doctor says it is okay to take showers. Pat the surgery area dry. Do not rub the surgery area with a washcloth or towel. Do not bathe, swim, or use a hot tub until your doctor says it is okay. °· Eat foods that are high in fiber. These include raw fruits and vegetables, whole grains, beans, and nuts. Choose lean meats. Avoid canned, processed, and fried foods. °· Drink enough fluids to keep your pee (urine) clear or pale yellow. °· Weigh yourself every day. °· Rest and limit activity as told by your doctor. You may be told to: °· Stop any activity if you have chest pain, shortness of breath, changes in heartbeat, or dizziness. Get help right away if this happens. °· Move around often for short amounts of time or take short walks as told by your doctor. Gradually become more active. You may need help to strengthen your muscles and build endurance. °· Avoid lifting, pushing, or pulling anything heavier than 10 pounds (4.5 kg) for at least 6 weeks after surgery. °· Do not drive until your doctor says it is okay. °· Ask your doctor when you can go back to work. °· Ask your doctor when you can begin sexual activity again. °· Follow up with your doctor as told. °GET HELP IF: °· You have puffiness, redness, more pain, or fluid draining from the incision site. °· You have a fever. °· You have puffiness in your ankles or legs. °· You have pain in your legs. °· You gain 2 or more pounds (0.9 kg) a day. °· You feel sick to your stomach (nauseous) or throw up (vomit). °· You have watery poop (diarrhea). °GET HELP RIGHT AWAY IF: °· You have chest pain that goes to your jaw or arms. °· You have shortness of breath. °· You have a fast or irregular heartbeat. °· You notice a "clicking" in your breastbone when you move. °· You  have numbness or weakness in your arms or legs. °· You feel dizzy or light-headed. °MAKE SURE YOU: °· Understand these instructions. °· Will watch your condition. °· Will get help right away if you are not doing well or get worse. °  °This information is not intended to replace advice given to you by your health care provider. Make sure you discuss any questions you have with your health care provider. °  °Document Released: 07/17/2013 Document Reviewed: 07/17/2013 °Elsevier Interactive Patient Education ©2016 Elsevier Inc. ° °

## 2015-08-20 NOTE — Discharge Summary (Signed)
Physician Discharge Summary  Patient ID: [name] [name] MRN: [id number] DOB/AGE: 01-14-1941 75 y.o.  Admit date: 08/10/2015 Discharge date: 08/22/2015  Admission Diagnoses: Acute inferior wall myocardial infarction  Discharge Diagnoses:  Active Problems:   Essential hypertension   Acute MI, inferior wall, initial episode of care [facility])   Acute inferior myocardial infarction [facility])   CAD (coronary artery disease), native coronary artery   CAD (coronary artery disease)   S/P CABG x 3  Patient Active Problem List   Diagnosis Date Noted  . S/P CABG x 3 08/14/2015  . CAD (coronary artery disease), native coronary artery 08/11/2015  . CAD (coronary artery disease)   . Acute inferior myocardial infarction ([place]) 08/10/2015  . Acute MI, inferior wall, initial episode of care ([place])   . Essential hypertension 02/19/2015    HPI: at time of admission  75 year old woman with history of hypertension who has been having exertional chest discomfort over the past month. On occasion, symptoms will resolve while she is walking. At other times, she has to stop walking. Yesterday, she had some exertional discomfort that it was more severe and it lasted longer than usual. She has not had any prior cardiac workup. Initial ECG showed a question of some inferior ST segment changes. Initially, a code STEMI was activated. However, there were no reciprocal changes on the ECG and she was pain-free. When I saw her in the emergency room, she did not report pain but has a slight discomfort in the center of the chest. This is similar to what she has had with exertion in the past. She denies any bleeding problems. She has no upcoming elective surgery.  Cardiac enzymes were positive and EKG showed ST segment elevation in leads 2 and 3. She reports some frequent urination. No family history of coronary artery disease. No history of tobacco use.  She was admitted for further evaluation and treatment to include emergent  cardiac catheterization.  Discharged Condition: good  Hospital Course: The patient was admitted through the emergency department by the cardiology team and taken to the cath lab where she was found to have severe three-vessel coronary disease. There is a high-grade stenosis in the mid posterior descending coronary artery as well as a severe ostial LAD and circumflex disease. A PTCA of the posterior descending was performed. Left ventricular function was normal. Left ventricular end-diastolic pressure was low. The patient was felt to be a candidate for coronary artery surgical revascularization and consultation was obtained with Dr [name] who evaluated the patient and her studies and agreed with recommendations. She was placed on Aggrastat. She continued to be monitored and managed aggressively and remained medically stable to proceed with surgery on 08/14/2015 as described below. Postoperatively the patient has progressed steadily. Inotropic support was weaned without significant difficulty. She was weaned from the ventilator using standard protocols. All routine lines, monitors and drainage devices have been discontinued in the standard fashion. She does have some postoperative volume overload but is responding well to diuretics. She had postoperative thrombocytopenia which is improving over time. She has an expected acute blood loss anemia which is improving over time. Incisions are healing well without evidence of infection. She is tolerating gradually increasing activities using standard protocols. Her overall status is felt to be stable for discharge   Consults: cardiology and cardiothoracic surgery  Significant Diagnostic Studies: angiography: cardiac cath  Treatments:  cardiac cath  and surgery DATE OF PROCEDURE: 08/14/2015 DATE OF DISCHARGE:   OPERATIVE REPORT  OPERATION: 1. Coronary artery bypass grafting x3 (left internal mammary artery to   LAD, saphenous vein graft to ramus intermediate, saphenous vein  graft to posterior descending). 2. Endoscopic harvest of right leg greater saphenous vein.  SURGEON: [name], M.D.  ASSISTANT: Jadene Pierini, PA-C.  ANESTHESIA: General by Dr. Annye Asa, M.D.  PREOPERATIVE DIAGNOSIS: Post myocardial infarction unstable angina, severe multivessel coronary artery disease.  POSTOPERATIVE DIAGNOSIS: Post myocardial infarction unstable angina, severe multivessel coronary artery disease.  Discharge Exam: Blood pressure 131/62, pulse 91, temperature 99.4 F (37.4 C), temperature source Oral, resp. rate 18, height 5\' 10"  (1.778 m), weight 176 lb 12.8 oz (80.196 kg), SpO2 97 %.   General appearance: alert, cooperative and no distress Heart: regular rate and rhythm Lungs: clear to auscultation bilaterally Abdomen: benign Extremities: trace edema Wound: incis healing well Disposition: 01-Home or Self Care      Discharge Instructions    Face-to-face encounter (required for Medicare/Medicaid patients)    Complete by:  As directed   I Cora Stetson E certify that this patient is under my care and that I, or a nurse practitioner or physician's assistant working with me, had a face-to-face encounter that meets the physician face-to-face encounter requirements with this patient on 08/22/2015. The encounter with the patient was in whole, or in part for the following medical condition(s) which is the primary reason for home health care (List medical condition): recent CABG  The encounter with the patient was in whole, or in part, for the following medical condition, which is the primary reason for home health care:  yes  I certify that, based on my findings, the following services are medically necessary home health services:  Physical therapy  Reason for Medically Necessary Home Health Services:  Therapy- Home Adaptation to Facilitate Safety  My clinical findings support the  need for the above services:  Unsafe ambulation due to balance issues  Further, I certify that my clinical findings support that this patient is homebound due to:  Shortness of Breath with activity     Home Health    Complete by:  As directed   To provide the following care/treatments:  PT            Medication List    STOP taking these medications        losartan 50 MG tablet  Commonly known as:  COZAAR     lubiprostone 24 MCG capsule  Commonly known as:  AMITIZA      TAKE these medications        aspirin 325 MG EC tablet  Take 1 tablet (325 mg total) by mouth daily.     atorvastatin 80 MG tablet  Commonly known as:  LIPITOR  Take 1 tablet (80 mg total) by mouth daily at 6 PM.     furosemide 40 MG tablet  Commonly known as:  LASIX  Take 1 tablet (40 mg total) by mouth daily.     metoprolol 50 MG tablet  Commonly known as:  LOPRESSOR  Take 1 tablet (50 mg total) by mouth 2 (two) times daily.     NORVASC 10 MG tablet  Generic drug:  amLODipine  Take 10 mg by mouth daily.     oxyCODONE 5 MG immediate release tablet  Commonly known as:  Oxy IR/ROXICODONE  Take 1 tablet (5 mg total) by mouth every 4 (four) hours as needed for moderate pain or severe pain.     potassium chloride SA 20 MEQ tablet  Commonly known as:  K-DUR,KLOR-CON  Take 1 tablet (20 mEq total) by mouth daily.       Follow-up Information    Follow up with Len Childs, MD.   Specialty:  Cardiothoracic Surgery   Why:  09/17/2015 at 1 PM to see the surgeon. Please obtain a chest x-ray [facility] at 12:30 PM. [facility] is located in the same office complex.   Contact information:   [address] [place] [address] [phone]       Follow up with [name], NP.   Specialties:  Nurse Practitioner, Radiology, Cardiology   Why:  08/28/2015 at 1:30 PM and there [place] office.   Contact information:   [place]  [address] [phone]      The patient has been discharged on:   1.Beta Blocker:  Yes [ y  ]                              No   [   ]                              If No, reason:  2.Ace Inhibitor/ARB: Yes [   ]                                     No  [ n   ]                                     If No, reason:low BP  3.Statin:   Yes Blue.Reese   ]                  No  [   ]                  If No, reason:  4.Ecasa:  Yes  [  y ]                  No   [   ]                  If No, reason:  Signed: Jennine Peddy E 08/22/2015, 7:49 AM

## 2015-08-20 NOTE — Progress Notes (Signed)
CARDIAC REHAB PHASE I   PRE:  Rate/Rhythm: 93 SR  BP:  Supine: 130/72  Sitting:   Standing:    SaO2: 95%RA  MODE:  Ambulation: 325 ft   POST:  Rate/Rhythm: 108 ST  BP:  Supine:   Sitting: 138/80  Standing:    SaO2: 91-92%RA 1037-1102 Pt not feeling well but agreed to walk. Pt walked 325 ft on RA with rolling walker and asst x 1. Would recommend rolling walker for home use. Back to bed after walk. Tired.   Graylon Good, RN BSN  08/20/2015 10:58 AM

## 2015-08-21 LAB — GLUCOSE, CAPILLARY
GLUCOSE-CAPILLARY: 105 mg/dL — AB (ref 65–99)
Glucose-Capillary: 126 mg/dL — ABNORMAL HIGH (ref 65–99)
Glucose-Capillary: 143 mg/dL — ABNORMAL HIGH (ref 65–99)
Glucose-Capillary: 110 mg/dL — ABNORMAL HIGH (ref 65–99)

## 2015-08-21 LAB — BASIC METABOLIC PANEL
Anion gap: 11 (ref 5–15)
BUN: 14 mg/dL (ref 6–20)
CO2: 25 mmol/L (ref 22–32)
Calcium: 8.6 mg/dL — ABNORMAL LOW (ref 8.9–10.3)
Chloride: 98 mmol/L — ABNORMAL LOW (ref 101–111)
Creatinine, Ser: 0.79 mg/dL (ref 0.44–1.00)
GFR calc Af Amer: >60 mL/min (ref >60)
GFR calc non Af Amer: >60 mL/min (ref >60)
Glucose, Bld: 140 mg/dL — ABNORMAL HIGH (ref 65–99)
Potassium: 3.7 mmol/L (ref 3.5–5.1)
Sodium: 134 mmol/L — ABNORMAL LOW (ref 135–145)

## 2015-08-21 MED ORDER — METOPROLOL TARTRATE 25 MG/10 ML ORAL SUSPENSION
12.5000 mg | Freq: Two times a day (BID) | ORAL | Status: DC
  Filled 2015-08-21 (×2): qty 10

## 2015-08-21 MED ORDER — METOPROLOL TARTRATE 50 MG PO TABS
50.0000 mg | ORAL_TABLET | Freq: Two times a day (BID) | ORAL | Status: DC
  Administered 2015-08-21 – 2015-08-22 (×3): 50 mg via ORAL
  Filled 2015-08-21 (×3): qty 1

## 2015-08-21 NOTE — Progress Notes (Signed)
Physical Therapy Treatment Patient Details Name: [name] [name] MRN: [id number] DOB: 07-23-1941 Today's Date: 08/21/2015    History of Present Illness pt is a 75 y/o female with h/o HTN admitted with chest pain.  Cath showed 3 vessel ds.  s/p CABG 1/18.    PT Comments    Patient making progress with PT with regard to mobility. Patient ambulating 300 feet with rw and min guard to supervision. Patient declined attempting stairs during session. PT to continue to follow and progress mobility as tolerated in anticipation of  D/C to home.   Follow Up Recommendations  Supervision/Assistance - 24 hour;Home health PT     Equipment Recommendations  Rolling walker with 5" wheels    Recommendations for Other Services       Precautions / Restrictions Precautions Precautions: Sternal Restrictions Weight Bearing Restrictions: No (sternal precautions)    Mobility  Bed Mobility Overal bed mobility: Needs Assistance Bed Mobility: Sit to Sidelying       Sit to supine: Supervision   General bed mobility comments: holding pillow and using momentum to assist  Transfers Overall transfer level: Needs assistance Equipment used: Rolling walker (2 wheeled);None Transfers: Sit to/from Stand Sit to Stand: Supervision         General transfer comment: cues for sternal precautions  Ambulation/Gait Ambulation/Gait assistance: Min guard;Supervision Ambulation Distance (Feet): 300 Feet Assistive device: Rolling walker (2 wheeled) Gait Pattern/deviations: Step-through pattern     General Gait Details: no loss of balance or noted instability   Stairs Stairs:  (Patient declined attempting stairs.)          Wheelchair Mobility    Modified Rankin (Stroke Patients Only)       Balance Overall balance assessment: Needs assistance Sitting-balance support: No upper extremity supported Sitting balance-Leahy Scale: Good     Standing balance support: No upper extremity  supported Standing balance-Leahy Scale: Good Standing balance comment: static                    Cognition Arousal/Alertness: Awake/alert Behavior During Therapy: WFL for tasks assessed/performed Overall Cognitive Status: Within Functional Limits for tasks assessed                      Exercises      General Comments        Pertinent Vitals/Pain Pain Assessment: No/denies pain (feeling tired, no pain now)    Home Living                      Prior Function            PT Goals (current goals can now be found in the care plan section) Acute Rehab PT Goals Patient Stated Goal: go home with daughter PT Goal Formulation: With patient Time For Goal Achievement: 08/30/15 Potential to Achieve Goals: Good Progress towards PT goals: Progressing toward goals    Frequency  Min 3X/week    PT Plan Current plan remains appropriate    Co-evaluation             End of Session   Activity Tolerance: Patient tolerated treatment well Patient left: in bed;with call bell/phone within reach;with family/visitor present;with SCD's reapplied     Time: [id number] PT Time Calculation (min) (ACUTE ONLY): 19 min  Charges:  $Gait Training: 8-22 mins                    G Codes:  Cassell Clement, PT, CSCS Pager [phone] Office [phone]  08/21/2015, 10:00 AM

## 2015-08-21 NOTE — Progress Notes (Addendum)
[address]       [place] [address]             [phone]      7 Days Post-Op Procedure(s) (LRB): CORONARY ARTERY BYPASS GRAFTING (CABG)X3 LIMA-LAD; SVG-RAMUS; SVG-PD TRANSESOPHAGEAL ECHOCARDIOGRAM (TEE) (N/A) TRANSESOPHAGEAL ECHOCARDIOGRAM (TEE) (N/A) Subjective: Feels better today , stronger, + BM Objective: Vital signs in last 24 hours: Temp:  [97.5 F (36.4 C)-99.1 F (37.3 C)] 98.6 F (37 C) (01/26 0508) Pulse Rate:  [87-110] 110 (01/26 0508) Cardiac Rhythm:  [-] Sinus tachycardia (01/25 2021) Resp:  [18] 18 (01/26 0508) BP: (123-131)/(59-68) 126/67 mmHg (01/26 0508) SpO2:  [98 %] 98 % (01/26 0508) Weight:  [179 lb 12.8 oz (81.557 kg)] 179 lb 12.8 oz (81.557 kg) (01/26 0508)  Hemodynamic parameters for last 24 hours:    Intake/Output from previous day: 01/25 0701 - 01/26 0700 In: 483 [P.O.:480; I.V.:3] Out: 0  Intake/Output this shift:    General appearance: alert, cooperative and no distress Heart: regular rate and rhythm and tachy Lungs: clear to auscultation bilaterally Abdomen: benign Extremities: edema is improved Wound: incis healing well  Lab Results:  Recent Labs  08/19/15 0315  WBC 6.9  HGB 10.4*  HCT 32.5*  PLT 131*   BMET:  Recent Labs  08/20/15 0238 08/21/15 0331  NA 128* 134*  K 3.1* 3.7  CL 92* 98*  CO2 27 25  GLUCOSE 129* 140*  BUN 16 14  CREATININE 0.82 0.79  CALCIUM 8.1* 8.6*    PT/INR: No results for input(s): LABPROT, INR in the last 72 hours. ABG    Component Value Date/Time   PHART 7.458* 08/15/2015 0943   HCO3 26.2* 08/15/2015 0943   TCO2 24 08/15/2015 1651   O2SAT 65.0 08/17/2015 0350   CBG (last 3)   Recent Labs  08/20/15 1618 08/20/15 2043 08/21/15 0559  GLUCAP 115* 124* 126*    Meds Scheduled Meds: . sodium chloride   Intravenous Once  . sodium chloride   Intravenous Once  . antiseptic oral rinse  7 mL Mouth Rinse BID  . aspirin EC  325 mg Oral Daily   Or  . aspirin   324 mg Per Tube Daily  . atorvastatin  80 mg Oral q1800  . bisacodyl  10 mg Oral Daily   Or  . bisacodyl  10 mg Rectal Daily  . docusate sodium  200 mg Oral Daily  . enoxaparin (LOVENOX) injection  40 mg Subcutaneous Q24H  . furosemide  40 mg Oral Daily  . insulin aspart  0-15 Units Subcutaneous TID WC  . metolazone  5 mg Oral Daily  . metoprolol tartrate  25 mg Oral BID   Or  . metoprolol tartrate  12.5 mg Per Tube BID  . pantoprazole  40 mg Oral Daily  . potassium chloride  20 mEq Oral BID  . sodium chloride  10-40 mL Intracatheter Q12H  . sodium chloride  3 mL Intravenous Q12H   Continuous Infusions: . sodium chloride Stopped (08/15/15 1300)  . sodium chloride Stopped (08/15/15 1500)  . sodium chloride     PRN Meds:.sodium chloride, magnesium hydroxide, metoprolol, ondansetron (ZOFRAN) IV, oxyCODONE, sodium chloride, sodium chloride, traMADol  Xrays No results found.  Assessment/Plan: S/P Procedure(s) (LRB): CORONARY ARTERY BYPASS GRAFTING (CABG)X3 LIMA-LAD; SVG-RAMUS; SVG-PD TRANSESOPHAGEAL ECHOCARDIOGRAM (TEE) (N/A) TRANSESOPHAGEAL ECHOCARDIOGRAM (TEE) (N/A)  1 doing well overall 2 remains tachy, feels palpitations at times- will increase beta blocker 3 d/c epw's this am 4  cont to push rehab 5 gentle diuresis- cont lasix, sodium and K+ improved 6 poss home later today or am   LOS: 11 days    Miski Feldpausch E 08/21/2015  Home in am with home PT patient examined and medical record reviewed,agree with above note. [name] III 08/21/2015

## 2015-08-21 NOTE — Progress Notes (Signed)
Pacing wires and chest tube sutures removed. Vital signs taken. Pt educated on the importance of remaining on bed rest for an hour. Steri strips applied to drainage areas. Will continue to monitor.

## 2015-08-21 NOTE — Progress Notes (Signed)
CARDIAC REHAB PHASE I   PRE:  Rate/Rhythm: 99 SR  BP:  Supine:   Sitting:   Standing:    SaO2:  Ready to walk .Marland Kitchen From bathroom  MODE:  Ambulation: 450 ft   POST:  Rate/Rhythm: 90 SR  BP:  Supine:   Sitting: 130/78  Standing:    SaO2: 100%RA 1420-1445 Pt coming from bathroom and ready to walk. Walked 450 ft with hand held asst. Wanted to go without walker. Stopped to rest. Tired by end of walk but tolerated well. To sitting on side of bed after walk. Daughter in room. Pt stated sugars had been up. Discussed her A1C of 6.1 and importance of watching carbs.   Graylon Good, RN BSN  08/21/2015 2:42 PM

## 2015-08-22 LAB — GLUCOSE, CAPILLARY
GLUCOSE-CAPILLARY: 113 mg/dL — AB (ref 65–99)
GLUCOSE-CAPILLARY: 117 mg/dL — AB (ref 65–99)

## 2015-08-22 MED ORDER — INSULIN ASPART 100 UNIT/ML ~~LOC~~ SOLN: 0.0000 [IU] | Freq: Three times a day (TID) | SUBCUTANEOUS | Status: DC

## 2015-08-22 MED ORDER — FUROSEMIDE 40 MG PO TABS: 40.0000 mg | ORAL_TABLET | Freq: Every day | ORAL | Status: DC

## 2015-08-22 MED ORDER — ASPIRIN 325 MG PO TBEC
325.0000 mg | DELAYED_RELEASE_TABLET | Freq: Every day | ORAL | Status: DC
Start: 2015-08-22 — End: 2017-03-16

## 2015-08-22 MED ORDER — METOPROLOL TARTRATE 50 MG PO TABS: 50.0000 mg | ORAL_TABLET | Freq: Two times a day (BID) | ORAL | Status: DC

## 2015-08-22 MED ORDER — POTASSIUM CHLORIDE CRYS ER 20 MEQ PO TBCR: 20.0000 meq | EXTENDED_RELEASE_TABLET | Freq: Every day | ORAL | Status: DC

## 2015-08-22 MED ORDER — OXYCODONE HCL 5 MG PO TABS: 5.0000 mg | ORAL_TABLET | ORAL | Status: DC | PRN

## 2015-08-22 MED ORDER — ATORVASTATIN CALCIUM 80 MG PO TABS: 80.0000 mg | ORAL_TABLET | Freq: Every day | ORAL | Status: DC

## 2015-08-22 NOTE — Care Management Important Message (Signed)
Important Message  Patient Details  Name: [name] [name] MRN: [id number] Date of Birth: 09/13/1940   Medicare Important Message Given:  Yes    Diona Peregoy Abena 08/22/2015, 11:13 AM

## 2015-08-22 NOTE — Care Management Note (Signed)
Case Management Note Marvetta Gibbons RN, BSN Unit 2W-Case Manager [phone]  Patient Details  Name: JAZZY HOLDERBAUM MRN: [id number] Date of Birth: May 01, 1941  Subjective/Objective:   Pt admitted with acute MI, s/p CABG x3             Action/Plan: PTA pt lived at home- Per PT recommendations [facility] has been ordered- spoke with pt at bedside- who is agreeable to [facility] services- list provided for [facility] [place] agencies- per choice pt would like to use [facility] for HH-PT referral called to [place] with [facility] for HH-PT- pt also states she would like a RW and 3n1 for home- orders placed- and spoke with Brenton Grills at [facility] regarding DME needs- RW and 3n1 to be delivered to room prior to discharge.   Expected Discharge Date:    08/22/15             Expected Discharge Plan:  [place]  In-House Referral:     Discharge planning Services  CM Consult  Post Acute Care Choice:  Durable Medical Equipment, Home Health Choice offered to:  Patient  DME Arranged:  3-N-1, [place] rolling DME Agency:  [place]:  PT [place]:  [facility]  Status of Service:  Completed, signed off  Medicare Important Message Given:  Yes Date Medicare IM Given:    Medicare IM give by:    Date Additional Medicare IM Given:    Additional Medicare Important Message give by:     If discussed at [place] of Stay Meetings, dates discussed: 08/21/15   Discharge Disposition: Home/home health   Additional Comments:  Dawayne Patricia, RN 08/22/2015, 11:19 AM

## 2015-08-22 NOTE — Progress Notes (Signed)
      [address]       [place] [address]             [phone]      8 Days Post-Op Procedure(s) (LRB): CORONARY ARTERY BYPASS GRAFTING (CABG)X3 LIMA-LAD; SVG-RAMUS; SVG-PD TRANSESOPHAGEAL ECHOCARDIOGRAM (TEE) (N/A) TRANSESOPHAGEAL ECHOCARDIOGRAM (TEE) (N/A) Subjective: Feels pretty well  Objective: Vital signs in last 24 hours: Temp:  [98 F (36.7 C)-99.4 F (37.4 C)] 99.4 F (37.4 C) (01/27 0548) Pulse Rate:  [79-96] 91 (01/27 0548) Cardiac Rhythm:  [-] Normal sinus rhythm (01/26 2009) Resp:  [18] 18 (01/27 0548) BP: (118-136)/(61-72) 131/62 mmHg (01/27 0548) SpO2:  [97 %-99 %] 97 % (01/27 0548) Weight:  [176 lb 12.8 oz (80.196 kg)] 176 lb 12.8 oz (80.196 kg) (01/27 0548)  Hemodynamic parameters for last 24 hours:    Intake/Output from previous day: 01/26 0701 - 01/27 0700 In: 45 [P.O.:960] Out: 903 [Urine:900; Stool:3] Intake/Output this shift:    General appearance: alert, cooperative and no distress Heart: regular rate and rhythm Lungs: clear to auscultation bilaterally Abdomen: benign Extremities: trace edema Wound: incis healing well  Lab Results: No results for input(s): WBC, HGB, HCT, PLT in the last 72 hours. BMET:  Recent Labs  08/20/15 0238 08/21/15 0331  NA 128* 134*  K 3.1* 3.7  CL 92* 98*  CO2 27 25  GLUCOSE 129* 140*  BUN 16 14  CREATININE 0.82 0.79  CALCIUM 8.1* 8.6*    PT/INR: No results for input(s): LABPROT, INR in the last 72 hours. ABG    Component Value Date/Time   PHART 7.458* 08/15/2015 0943   HCO3 26.2* 08/15/2015 0943   TCO2 24 08/15/2015 1651   O2SAT 65.0 08/17/2015 0350   CBG (last 3)   Recent Labs  08/21/15 1603 08/21/15 2000 08/22/15 0613  GLUCAP 110* 105* 113*    Meds Scheduled Meds: . sodium chloride   Intravenous Once  . sodium chloride   Intravenous Once  . antiseptic oral rinse  7 mL Mouth Rinse BID  . aspirin EC  325 mg Oral Daily   Or  . aspirin  324 mg Per Tube Daily  .  atorvastatin  80 mg Oral q1800  . bisacodyl  10 mg Oral Daily   Or  . bisacodyl  10 mg Rectal Daily  . docusate sodium  200 mg Oral Daily  . enoxaparin (LOVENOX) injection  40 mg Subcutaneous Q24H  . furosemide  40 mg Oral Daily  . insulin aspart  0-15 Units Subcutaneous TID WC  . metoprolol tartrate  50 mg Oral BID   Or  . metoprolol tartrate  12.5 mg Per Tube BID  . pantoprazole  40 mg Oral Daily  . potassium chloride  20 mEq Oral BID  . sodium chloride  10-40 mL Intracatheter Q12H  . sodium chloride  3 mL Intravenous Q12H   Continuous Infusions: . sodium chloride Stopped (08/15/15 1300)  . sodium chloride Stopped (08/15/15 1500)  . sodium chloride     PRN Meds:.sodium chloride, magnesium hydroxide, metoprolol, ondansetron (ZOFRAN) IV, oxyCODONE, sodium chloride, sodium chloride, traMADol  Xrays No results found.  Assessment/Plan: S/P Procedure(s) (LRB): CORONARY ARTERY BYPASS GRAFTING (CABG)X3 LIMA-LAD; SVG-RAMUS; SVG-PD TRANSESOPHAGEAL ECHOCARDIOGRAM (TEE) (N/A) TRANSESOPHAGEAL ECHOCARDIOGRAM (TEE) (N/A)  1 conts to do well 2 appears stable for discharge    LOS: 12 days    GOLD,WAYNE E 08/22/2015

## 2015-08-22 NOTE — Progress Notes (Signed)
[id number] Education completed with pt and daughter who voiced understanding. Encouraged IS. Reviewed heart healthy diet and encouraged watching carbs. Discussed CRP 2 and referring to [facility] program. Put on discharge video for pt to view. Graylon Good RN BSN 08/22/2015 10:59 AM

## 2015-08-24 ENCOUNTER — Other Ambulatory Visit: Payer: Self-pay | Admitting: Surgical

## 2015-08-25 DIAGNOSIS — Z48812 Encounter for surgical aftercare following surgery on the circulatory system: Secondary | ICD-10-CM | POA: Diagnosis not present

## 2015-08-28 ENCOUNTER — Ambulatory Visit (INDEPENDENT_AMBULATORY_CARE_PROVIDER_SITE_OTHER): Payer: Medicare PPO | Admitting: Adult Health

## 2015-08-28 ENCOUNTER — Encounter: Payer: Self-pay | Admitting: Adult Health

## 2015-08-28 VITALS — BP 110/68 | HR 98 | Ht 70.0 in | Wt 175.0 lb

## 2015-08-28 DIAGNOSIS — F418 Other specified anxiety disorders: Secondary | ICD-10-CM

## 2015-08-28 DIAGNOSIS — I10 Essential (primary) hypertension: Secondary | ICD-10-CM | POA: Diagnosis not present

## 2015-08-28 DIAGNOSIS — Z951 Presence of aortocoronary bypass graft: Secondary | ICD-10-CM | POA: Diagnosis not present

## 2015-08-28 DIAGNOSIS — I2510 Atherosclerotic heart disease of native coronary artery without angina pectoris: Secondary | ICD-10-CM

## 2015-08-28 MED ORDER — LOSARTAN POTASSIUM 25 MG PO TABS: 25.0000 mg | ORAL_TABLET | Freq: Every day | ORAL | Status: DC

## 2015-08-28 MED ORDER — FUROSEMIDE 20 MG PO TABS
20.0000 mg | ORAL_TABLET | Freq: Every day | ORAL | Status: DC | PRN
Start: 2015-08-28 — End: 2015-12-24

## 2015-08-28 MED ORDER — METOPROLOL TARTRATE 50 MG PO TABS: 75.0000 mg | ORAL_TABLET | Freq: Two times a day (BID) | ORAL | Status: DC

## 2015-08-28 NOTE — Patient Instructions (Addendum)
Your physician recommends that you schedule a follow-up appointment in: 1 Month with [name]  Your physician has recommended you make the following change in your medication:   STOP TAKING  Amlodipine  (Norvasc)   DECREASE Lasix to 20 mg and Potassium As Needed   Start Losartan 25 mg Daily  Increase Metoprolol to 75 mg (1 1/5 Tablets) Two Times Daily  You have been referred to Cardiac Rehab   If you need a refill on your cardiac medications before your next appointment, please call your pharmacy.  Thank you for choosing [facility]!

## 2015-08-28 NOTE — Progress Notes (Signed)
Cardiology Office Note   Date:  08/28/2015   ID:  [name], [name] Mar 28, 1941, MRN [id number]  PCP:  [name] Hampshire, MD  Cardiologist:  [name] [name] be Est in [place]/ [name] Sims, NP   No chief complaint on file.     History of Present Illness: [name] [name] is a 75 y.o. female who presents for posthospitalization followup after admission for acute inferior myocardial infarction, status post cardiac catheterization,revealing high-grade stenosis of the mid posterior descending coronary artery as well.  A severe ostial LAD and circumflex disease.  A PTCA of the posterior descending was performed. which demonstrated severe native vessel disease, with subsequent coronary artery bypass grafting.  Coronary bypass grafting (LIMA to LAD, SVG to ramus intermediate, SVG to graft to posterior descending.  This was completed on 01/19//2016.she had some postoperative volume overload and was started on diuretics.  She also has some postoperative thrombocytopenia improving over time.  She comes today with multiple questions, also issues with anxiety.  Post operatively.  Her daughter, who is with her also has multiple questions.  We have spoken at length, answering many of her questions, giving her reassurance on some of her symptoms.  She has not yet been seen by cardiac rehabilitation.  She is anxious to go back to her normal life, but is also concerned about the fact that she has such profound fatigue.  She just finished her doses of Lasix and denies any cramping, edema,  dizziness, or worsening pain in her chest.  Her daughter states that she had been placed on losartan 50 mg daily.  Prior to going into the hospital, but she was restarted on amlodipine, 10 mg daily.  She is continued to take the amlodipine.  Past Medical History  Diagnosis Date  . Hypertension     Past Surgical History  Procedure Laterality Date  . Back surgery    . Abdominal hysterectomy    . Appendectomy    . Colonoscopy  N/A 03/29/2013    Procedure: COLONOSCOPY;  Surgeon: [name] Houston, MD;  [facility];  Service: Endoscopy;  Laterality: N/A;  930  . Cardiac catheterization N/A 08/10/2015    Procedure: Left Heart Cath and Coronary Angiography;  Surgeon: [name] Booze, MD;  Location: [facility] CV LAB;  Service: Cardiovascular;  Laterality: N/A;  . Cardiac catheterization  08/10/2015    Procedure: Coronary Balloon Angioplasty;  Surgeon: [name] Booze, MD;  Location: [facility] CV LAB;  Service: Cardiovascular;;  . Coronary artery bypass graft N/A 08/14/2015    Procedure: CORONARY ARTERY BYPASS GRAFTING (CABG)X3 LIMA-LAD; SVG-RAMUS; SVG-PD TRANSESOPHAGEAL ECHOCARDIOGRAM (TEE);  Surgeon: [name] Poot, MD;  Location: [facility];  Service: Open Heart Surgery;  Laterality: N/A;  . Tee without cardioversion N/A 08/14/2015    Procedure: TRANSESOPHAGEAL ECHOCARDIOGRAM (TEE);  Surgeon: [name] Poot, MD;  Location: [facility];  Service: Open Heart Surgery;  Laterality: N/A;     Current Outpatient Prescriptions  Medication Sig Dispense Refill  . aspirin EC 325 MG EC tablet Take 1 tablet (325 mg total) by mouth daily.    Marland Kitchen atorvastatin (LIPITOR) 80 MG tablet Take 1 tablet (80 mg total) by mouth daily at 6 PM. 30 tablet 1  . oxyCODONE (OXY IR/ROXICODONE) 5 MG immediate release tablet Take 1 tablet (5 mg total) by mouth every 4 (four) hours as needed for moderate pain or severe pain. 50 tablet 0  . potassium chloride SA (K-DUR,KLOR-CON) 20 MEQ tablet Take 1 tablet (20 mEq total) by  mouth daily. 5 tablet 0  . furosemide (LASIX) 20 MG tablet Take 1 tablet (20 mg total) by mouth daily as needed. Take with Potassium 30 tablet 6  . losartan (COZAAR) 25 MG tablet Take 1 tablet (25 mg total) by mouth daily. 90 tablet 3  . metoprolol (LOPRESSOR) 50 MG tablet Take 1.5 tablets (75 mg total) by mouth 2 (two) times daily. 90 tablet 6   No current facility-administered medications for this visit.    Allergies:    Other and Peanut-containing drug products    Social History:  The patient  reports that she has never smoked. She does not have any smokeless tobacco history on file. She reports that she does not drink alcohol or use illicit drugs.   Family History:  The patient's family history is negative for Colon cancer.    ROS: All other systems are reviewed and negative. Unless otherwise mentioned in H&P    PHYSICAL EXAM: VS:  BP 110/68 mmHg  Pulse 98  Ht 5\' 10"  (1.778 m)  Wt 175 lb (79.379 kg)  BMI 25.11 kg/m2  SpO2 99% , BMI Body mass index is 25.11 kg/(m^2). GEN: Well nourished, well developed, in no acute distress HEENT: normal Neck: no JVD, carotid bruits, or masses Cardiac: RRR, tachycardic:; no murmurs, rubs, or gallops,no edema  Respiratory:  clear to auscultation bilaterally, normal work of breathing GI: soft, nontender, nondistended, + BS MS: no deformity or atrophy Skin: warm and dry, no rash Neuro:  Strength and sensation are intact Psych: euthymic mood, full affect   EKG:  The ekg ordered today demonstrates sinus tachycardia, rate of 95 beats per minute, with anterior infarct noted.   Recent Labs: 08/11/2015: TSH 2.467 08/15/2015: Magnesium 2.3 08/19/2015: ALT 18; Hemoglobin 10.4*; Platelets 131* 08/21/2015: BUN 14; Creatinine, Ser 0.79; Potassium 3.7; Sodium 134*    Lipid Panel    Component Value Date/Time   CHOL 157 08/11/2015 0611   TRIG 73 08/11/2015 0611   HDL 51 08/11/2015 0611   CHOLHDL 3.1 08/11/2015 0611   VLDL 15 08/11/2015 0611   LDLCALC 91 08/11/2015 0611      Wt Readings from Last 3 Encounters:  08/28/15 175 lb (79.379 kg)  08/22/15 176 lb 12.8 oz (80.196 kg)  05/26/15 175 lb 4.8 oz (79.516 kg)      Other studies Reviewed: Additional studies/ records that were reviewed today include: CABG report. Review of the above records demonstrates: three-vessel bypass with evidence of fluid retention postoperatively.   ASSESSMENT AND PLAN:  1. CAD:  Status post three-vessel coronary artery bypass grafting: she has several questions and concerns, which I have addressed today at length.  I have given her reassurance on some of her symptoms of anxiety and mild depression.  Postoperatively.  I have also explained to her some of the physical symptoms.  She may experience postoperatively.  I have also given her advisement concerning feeling tired and fatigued.  Telling her to expect this on occasion after such a large surgery and that she can rest that day.  I referred her to cardiac rehabilitation for emotional support, and postoperative, exercise, and education. Due to tachycardia, I have increased her metoprolol to 75 mg twice a day.  2. Hypertension: the patient is back on amlodipine, but I would prefer her to be on losartan.  I will decrease it to 25 mg daily,as  I am increasing her metoprolol dose.   3. Anxiety about health: I have given her reassurance concerning this issue, she is  requesting a low dose of anti-anxiety medicine.  If she has never been on one before I started her on Xanax 0.25 mg every 12 hours.  I am not giving her any refills as I feel that this will pass when she becomes more active in cardiac rehabilitation and is beginning to have more energy.  Any further refills should be given to her by her primary care physician.  I have explained this to her.  Current medicines are reviewed at length with the patient today.    Labs/ tests ordered today include: none  Orders Placed This Encounter  Procedures  . AMB referral to cardiac rehabilitation  . EKG 12-Lead     Disposition:   FU with one month   Signed, [name] Sims, NP  08/28/2015 2:36 PM    [facility] [address], [place], [place] [address] Phone: [phone]; Fax: [phone]

## 2015-08-28 NOTE — Progress Notes (Deleted)
Name: [name] [name]    DOB: August 15, 1940  Age: 75 y.o.  MR#: [id number]       PCP:  Lanette Hampshire, MD      Insurance: Payor: Mcarthur Rossetti MEDICARE / Plan: [employer] MEDICARE CHOICE PPO / Product Type: *No Product type* /   CC:   No chief complaint on file.   VS Filed Vitals:   08/28/15 1343  BP: 110/68  Pulse: 98  Height: 5\' 10"  (1.778 m)  Weight: 175 lb (79.379 kg)  SpO2: 99%    Weights Current Weight  08/28/15 175 lb (79.379 kg)  08/22/15 176 lb 12.8 oz (80.196 kg)  05/26/15 175 lb 4.8 oz (79.516 kg)    Blood Pressure  BP Readings from Last 3 Encounters:  08/28/15 110/68  08/22/15 120/73  05/26/15 128/90     Admit date:  (Not on file) Last encounter with RMR:  Visit date not found   Allergy Other and Peanut-containing drug products  Current Outpatient Prescriptions  Medication Sig Dispense Refill  . amLODipine (NORVASC) 10 MG tablet Take 10 mg by mouth daily.    Marland Kitchen aspirin EC 325 MG EC tablet Take 1 tablet (325 mg total) by mouth daily.    Marland Kitchen atorvastatin (LIPITOR) 80 MG tablet Take 1 tablet (80 mg total) by mouth daily at 6 PM. 30 tablet 1  . furosemide (LASIX) 40 MG tablet Take 1 tablet (40 mg total) by mouth daily. 5 tablet 0  . metoprolol (LOPRESSOR) 50 MG tablet Take 1 tablet (50 mg total) by mouth 2 (two) times daily. 60 tablet 1  . oxyCODONE (OXY IR/ROXICODONE) 5 MG immediate release tablet Take 1 tablet (5 mg total) by mouth every 4 (four) hours as needed for moderate pain or severe pain. 50 tablet 0  . potassium chloride SA (K-DUR,KLOR-CON) 20 MEQ tablet Take 1 tablet (20 mEq total) by mouth daily. 5 tablet 0   No current facility-administered medications for this visit.    Discontinued Meds:   There are no discontinued medications.  Patient Active Problem List   Diagnosis Date Noted  . S/P CABG x 3 08/14/2015  . CAD (coronary artery disease), native coronary artery 08/11/2015  . CAD (coronary artery disease)   . Acute inferior myocardial infarction ([place])  08/10/2015  . Acute MI, inferior wall, initial episode of care ([facility])   . Essential hypertension 02/19/2015    LABS    Component Value Date/Time   NA 134* 08/21/2015 0331   NA 128* 08/20/2015 0238   NA 137 08/19/2015 0315   K 3.7 08/21/2015 0331   K 3.1* 08/20/2015 0238   K 3.1* 08/19/2015 0315   CL 98* 08/21/2015 0331   CL 92* 08/20/2015 0238   CL 96* 08/19/2015 0315   CO2 25 08/21/2015 0331   CO2 27 08/20/2015 0238   CO2 29 08/19/2015 0315   GLUCOSE 140* 08/21/2015 0331   GLUCOSE 129* 08/20/2015 0238   GLUCOSE 121* 08/19/2015 0315   BUN 14 08/21/2015 0331   BUN 16 08/20/2015 0238   BUN 20 08/19/2015 0315   CREATININE 0.79 08/21/2015 0331   CREATININE 0.82 08/20/2015 0238   CREATININE 0.90 08/19/2015 0315   CALCIUM 8.6* 08/21/2015 0331   CALCIUM 8.1* 08/20/2015 0238   CALCIUM 8.8* 08/19/2015 0315   GFRNONAA >60 08/21/2015 0331   GFRNONAA >60 08/20/2015 0238   GFRNONAA >60 08/19/2015 0315   GFRAA >60 08/21/2015 0331   GFRAA >60 08/20/2015 0238   GFRAA >60 08/19/2015 0315   CMP  Component Value Date/Time   NA 134* 08/21/2015 0331   K 3.7 08/21/2015 0331   CL 98* 08/21/2015 0331   CO2 25 08/21/2015 0331   GLUCOSE 140* 08/21/2015 0331   BUN 14 08/21/2015 0331   CREATININE 0.79 08/21/2015 0331   CALCIUM 8.6* 08/21/2015 0331   PROT 5.5* 08/19/2015 0315   ALBUMIN 2.2* 08/19/2015 0315   AST 17 08/19/2015 0315   ALT 18 08/19/2015 0315   ALKPHOS 108 08/19/2015 0315   BILITOT 0.8 08/19/2015 0315   GFRNONAA >60 08/21/2015 0331   GFRAA >60 08/21/2015 0331       Component Value Date/Time   WBC 6.9 08/19/2015 0315   WBC 8.6 08/18/2015 0511   WBC 9.2 08/17/2015 0353   HGB 10.4* 08/19/2015 0315   HGB 9.6* 08/18/2015 0511   HGB 9.2* 08/17/2015 1641   HCT 32.5* 08/19/2015 0315   HCT 29.6* 08/18/2015 0511   HCT 27.0* 08/17/2015 1641   MCV 87.4 08/19/2015 0315   MCV 88.9 08/18/2015 0511   MCV 87.7 08/17/2015 0353    Lipid Panel     Component Value Date/Time    CHOL 157 08/11/2015 0611   TRIG 73 08/11/2015 0611   HDL 51 08/11/2015 0611   CHOLHDL 3.1 08/11/2015 0611   VLDL 15 08/11/2015 0611   LDLCALC 91 08/11/2015 0611    ABG    Component Value Date/Time   PHART 7.458* 08/15/2015 0943   PCO2ART 37.0 08/15/2015 0943   PO2ART 82.0 08/15/2015 0943   HCO3 26.2* 08/15/2015 0943   TCO2 24 08/15/2015 1651   O2SAT 65.0 08/17/2015 0350     Lab Results  Component Value Date   TSH 2.467 08/11/2015   BNP (last 3 results) No results for input(s): BNP in the last 8760 hours.  ProBNP (last 3 results) No results for input(s): PROBNP in the last 8760 hours.  Cardiac Panel (last 3 results) No results for input(s): CKTOTAL, CKMB, TROPONINI, RELINDX in the last 72 hours.  Iron/TIBC/Ferritin/ %Sat No results found for: IRON, TIBC, FERRITIN, IRONPCTSAT   EKG Orders placed or performed during the hospital encounter of 08/10/15  . ED EKG within 10 minutes  . ED EKG within 10 minutes  . EKG 12-Lead  . EKG 12-Lead  . EKG 12-Lead  . EKG 12-Lead  . ED EKG  . ED EKG  . EKG 12-Lead  . EKG 12-Lead  . EKG 12-Lead  . EKG 12-Lead  . EKG 12-Lead  . EKG 12-Lead  . EKG 12-Lead  . EKG 12-Lead     Prior Assessment and Plan Problem List as of 08/28/2015      Cardiovascular and Mediastinum   Essential hypertension   Acute MI, inferior wall, initial episode of care [facility])   Acute inferior myocardial infarction [facility])   CAD (coronary artery disease), native coronary artery   CAD (coronary artery disease)     Other   S/P CABG x 3       Imaging: Dg Chest 2 View  08/19/2015  CLINICAL DATA:  Status post coronary artery bypass grafting. Chest pain. EXAM: CHEST  2 VIEW COMPARISON:  August 18, 2015 ; August 17, 2015 FINDINGS: There is persistent atelectasis in each upper lobe and left base regions without change. No new opacity. Heart is upper normal in size with pulmonary vascularity within normal limits. No pneumothorax. No adenopathy. There is  degenerative change in the lower thoracic spine. IMPRESSION: Stable areas of atelectatic change bilaterally. No new opacity. No pneumothorax. No change  in cardiac silhouette. Electronically Signed   By: Lowella Grip III M.D.   On: 08/19/2015 07:44   Dg Chest 2 View  08/10/2015  CLINICAL DATA:  Abnormal EKG. Chest pressure beginning yesterday afternoon while walking dog. EXAM: CHEST  2 VIEW COMPARISON:  None. FINDINGS: Lungs are adequately inflated without consolidation or effusion. Cardiomediastinal silhouette is within normal. There is mild degenerative change of the spine. IMPRESSION: No active cardiopulmonary disease. Electronically Signed   By: Marin Olp M.D.   On: 08/10/2015 16:03   Dg Chest Port 1 View  08/18/2015  CLINICAL DATA:  Atelectasis and hypertension EXAM: PORTABLE CHEST 1 VIEW COMPARISON:  August 17, 2015 FINDINGS: Central catheter tip is in the superior vena cava. No pneumothorax. There is persistent upper lobe and right base atelectatic change. There is a minimal right pleural effusion. No new opacity is seen. Heart is mildly enlarged with pulmonary vascular within normal limits. Patient is status post coronary artery bypass grafting. No adenopathy. IMPRESSION: Areas of atelectatic change, stable. No change in cardiac silhouette. Minimal right effusion. No pneumothorax. Electronically Signed   By: Lowella Grip III M.D.   On: 08/18/2015 07:20   Dg Chest Port 1 View  08/17/2015  CLINICAL DATA:  Status post CABG EXAM: PORTABLE CHEST 1 VIEW COMPARISON:  08/16/2015 FINDINGS: Status post CABG. Mild cardiomegaly again noted. Right IJ sheath is unchanged in position. Bilateral chest tube has been removed. There is no pneumothorax. Tiny bilateral pleural effusion with basilar atelectasis. Mild linear atelectasis in right upper lobe. No pulmonary edema. IMPRESSION: Status post CABG. No convincing pulmonary edema. Bilateral small pleural effusion with bilateral basilar atelectasis.  Mild linear atelectasis in right upper lobe. There is no pneumothorax. Electronically Signed   By: Lahoma Crocker M.D.   On: 08/17/2015 09:42   Dg Chest Port 1 View  08/16/2015  CLINICAL DATA:  Status post CABG. EXAM: PORTABLE CHEST 1 VIEW COMPARISON:  08/15/2015 FINDINGS: Swan-Ganz catheter has been removed. Right IJ approach venous catheter sheath is in place. Bilateral thoracostomy tubes, and mediastinal drain are stable in position. Postsurgical changes of recent CABG are again seen. The patient is extubated. Cardiomediastinal silhouette is normal. Mediastinal contours appear intact. There is no evidence of focal airspace consolidation, pleural effusion or pneumothorax. The lung volumes are low with bibasilar atelectasis. Osseous structures are without acute abnormality. Soft tissues are grossly normal. IMPRESSION: Status post extubation and removal Swan-Ganz catheter. The remaining of the postsurgical support apparatus is stable. Low lung volumes with bibasilar atelectasis. Electronically Signed   By: Fidela Salisbury M.D.   On: 08/16/2015 10:02   Dg Chest Port 1 View  08/15/2015  CLINICAL DATA:  CABG EXAM: PORTABLE CHEST 1 VIEW COMPARISON:  Yesterday FINDINGS: Grossly stable heart size and upper mediastinal contours after CABG. Low lung volumes with hazy basilar opacities consistent with atelectasis and pleural fluid. Endotracheal tube tip remains at the clavicular level. The orogastric tube enters the stomach at least. Swan-Ganz catheter from the right, tip at the interlobar or right main pulmonary artery. Stable thoracic drains. No visible pneumothorax. IMPRESSION: 1. Stable and unremarkable positioning of tubes and central line. 2. Unchanged low volumes and atelectasis. Electronically Signed   By: Monte Fantasia M.D.   On: 08/15/2015 08:29   Dg Chest Port 1 View  08/14/2015  CLINICAL DATA:  CABG. EXAM: PORTABLE CHEST 1 VIEW COMPARISON:  August 14, 2015 FINDINGS: The support apparatus is in good  position. No pneumothorax. The cardiomediastinal silhouette is stable. The lungs  are unchanged. Mild pulmonary venous congestion. IMPRESSION: Stable support apparatus.  No significant interval changes. Electronically Signed   By: Dorise Bullion III M.D   On: 08/14/2015 16:09   Dg Chest Portable 1 View  08/14/2015  CLINICAL DATA:  Status post CABG. EXAM: PORTABLE CHEST 1 VIEW COMPARISON:  08/10/2015. FINDINGS: Interval post CABG changes with mediastinal and bilateral chest tubes. No pneumothorax. Right jugular Swan-Ganz catheter tip in the intersegmental pulmonary artery on the right. Endotracheal tube in satisfactory position. An esophageal probe is in place. Normal sized heart. Poor inspiration. Mild bibasilar atelectasis. Thoracic spine degenerative changes. IMPRESSION: Mild bibasilar atelectasis. Electronically Signed   By: Claudie Revering M.D.   On: 08/14/2015 15:21

## 2015-08-29 ENCOUNTER — Other Ambulatory Visit: Payer: Self-pay | Admitting: Adult Health

## 2015-08-29 MED ORDER — POTASSIUM CHLORIDE CRYS ER 20 MEQ PO TBCR: 20.0000 meq | EXTENDED_RELEASE_TABLET | Freq: Every day | ORAL | Status: DC

## 2015-08-29 NOTE — Telephone Encounter (Signed)
Refill complete 

## 2015-08-29 NOTE — Telephone Encounter (Signed)
Pt's daughter left voicemail stating that her potassium was not sent to [facility]

## 2015-08-31 ENCOUNTER — Inpatient Hospital Stay (HOSPITAL_COMMUNITY)
Admission: EM | Admit: 2015-08-31 | Discharge: 2015-09-14 | DRG: 857 | Disposition: A | Discharge status: Home Health | Payer: Medicare PPO | Attending: Cardiothoracic Surgery | Admitting: Cardiothoracic Surgery

## 2015-08-31 ENCOUNTER — Emergency Department (HOSPITAL_COMMUNITY): Payer: Medicare PPO

## 2015-08-31 ENCOUNTER — Encounter (HOSPITAL_COMMUNITY): Payer: Self-pay | Admitting: Emergency Medicine

## 2015-08-31 DIAGNOSIS — Z01818 Encounter for other preprocedural examination: Secondary | ICD-10-CM

## 2015-08-31 DIAGNOSIS — F419 Anxiety disorder, unspecified: Secondary | ICD-10-CM | POA: Diagnosis present

## 2015-08-31 DIAGNOSIS — I10 Essential (primary) hypertension: Secondary | ICD-10-CM | POA: Diagnosis present

## 2015-08-31 DIAGNOSIS — Z7982 Long term (current) use of aspirin: Secondary | ICD-10-CM | POA: Diagnosis not present

## 2015-08-31 DIAGNOSIS — I2510 Atherosclerotic heart disease of native coronary artery without angina pectoris: Secondary | ICD-10-CM | POA: Diagnosis present

## 2015-08-31 DIAGNOSIS — Z79899 Other long term (current) drug therapy: Secondary | ICD-10-CM | POA: Diagnosis not present

## 2015-08-31 DIAGNOSIS — Y832 Surgical operation with anastomosis, bypass or graft as the cause of abnormal reaction of the patient, or of later complication, without mention of misadventure at the time of the procedure: Secondary | ICD-10-CM | POA: Diagnosis present

## 2015-08-31 DIAGNOSIS — J939 Pneumothorax, unspecified: Secondary | ICD-10-CM

## 2015-08-31 DIAGNOSIS — T81328A Disruption or dehiscence of closure of other specified internal operation (surgical) wound, initial encounter: Secondary | ICD-10-CM | POA: Diagnosis present

## 2015-08-31 DIAGNOSIS — I252 Old myocardial infarction: Secondary | ICD-10-CM | POA: Diagnosis not present

## 2015-08-31 DIAGNOSIS — D62 Acute posthemorrhagic anemia: Secondary | ICD-10-CM | POA: Diagnosis not present

## 2015-08-31 DIAGNOSIS — J90 Pleural effusion, not elsewhere classified: Secondary | ICD-10-CM | POA: Diagnosis not present

## 2015-08-31 DIAGNOSIS — R0602 Shortness of breath: Secondary | ICD-10-CM

## 2015-08-31 DIAGNOSIS — T8149XA Infection following a procedure, other surgical site, initial encounter: Secondary | ICD-10-CM | POA: Diagnosis present

## 2015-08-31 DIAGNOSIS — Z9689 Presence of other specified functional implants: Secondary | ICD-10-CM

## 2015-08-31 DIAGNOSIS — Z951 Presence of aortocoronary bypass graft: Secondary | ICD-10-CM

## 2015-08-31 DIAGNOSIS — T814XXA Infection following a procedure, initial encounter: Secondary | ICD-10-CM | POA: Diagnosis not present

## 2015-08-31 DIAGNOSIS — J853 Abscess of mediastinum: Secondary | ICD-10-CM

## 2015-08-31 DIAGNOSIS — T8132XA Disruption of internal operation (surgical) wound, not elsewhere classified, initial encounter: Secondary | ICD-10-CM | POA: Diagnosis present

## 2015-08-31 LAB — URINE MICROSCOPIC-ADD ON

## 2015-08-31 LAB — COMPREHENSIVE METABOLIC PANEL
ALBUMIN: 2.6 g/dL — AB (ref 3.5–5.0)
ALK PHOS: 160 U/L — AB (ref 38–126)
ALT: 36 U/L (ref 14–54)
ANION GAP: 10 (ref 5–15)
AST: 41 U/L (ref 15–41)
BUN: 32 mg/dL — ABNORMAL HIGH (ref 6–20)
CALCIUM: 8.7 mg/dL — AB (ref 8.9–10.3)
CHLORIDE: 99 mmol/L — AB (ref 101–111)
CO2: 25 mmol/L (ref 22–32)
CREATININE: 1.09 mg/dL — AB (ref 0.44–1.00)
GFR calc Af Amer: 57 mL/min — ABNORMAL LOW (ref >60)
GFR calc non Af Amer: 49 mL/min — ABNORMAL LOW (ref >60)
GLUCOSE: 119 mg/dL — AB (ref 65–99)
Potassium: 4.1 mmol/L (ref 3.5–5.1)
SODIUM: 134 mmol/L — AB (ref 135–145)
Total Bilirubin: 0.9 mg/dL (ref 0.3–1.2)
Total Protein: 7 g/dL (ref 6.5–8.1)

## 2015-08-31 LAB — CBC WITH DIFFERENTIAL/PLATELET
BASOS PCT: 0 %
Basophils Absolute: 0 10*3/uL (ref 0.0–0.1)
EOS ABS: 0.1 10*3/uL (ref 0.0–0.7)
EOS PCT: 1 %
HCT: 31.3 % — ABNORMAL LOW (ref 36.0–46.0)
HEMOGLOBIN: 10.1 g/dL — AB (ref 12.0–15.0)
Lymphocytes Relative: 8 %
Lymphs Abs: 0.9 10*3/uL (ref 0.7–4.0)
MCH: 28.1 pg (ref 26.0–34.0)
MCHC: 32.3 g/dL (ref 30.0–36.0)
MCV: 86.9 fL (ref 78.0–100.0)
Monocytes Absolute: 1 10*3/uL (ref 0.1–1.0)
Monocytes Relative: 9 %
NEUTROS PCT: 82 %
Neutro Abs: 9.1 10*3/uL — ABNORMAL HIGH (ref 1.7–7.7)
PLATELETS: 374 10*3/uL (ref 150–400)
RBC: 3.6 MIL/uL — AB (ref 3.87–5.11)
RDW: 14.6 % (ref 11.5–15.5)
WBC: 11.2 10*3/uL — AB (ref 4.0–10.5)

## 2015-08-31 LAB — URINALYSIS, ROUTINE W REFLEX MICROSCOPIC
Bilirubin Urine: NEGATIVE
Glucose, UA: NEGATIVE mg/dL
Ketones, ur: NEGATIVE mg/dL
Nitrite: POSITIVE — AB
Protein, ur: 30 mg/dL — AB
Specific Gravity, Urine: <1.005 — ABNORMAL LOW (ref 1.005–1.030)
pH: 5.5 (ref 5.0–8.0)

## 2015-08-31 LAB — I-STAT CG4 LACTIC ACID, ED: Lactic Acid, Venous: 1.4 mmol/L (ref 0.5–2.0)

## 2015-08-31 MED ORDER — KCL IN DEXTROSE-NACL 10-5-0.45 MEQ/L-%-% IV SOLN
INTRAVENOUS | Status: DC
  Administered 2015-08-31: 50 mL/h via INTRAVENOUS
  Filled 2015-08-31 (×6): qty 1000

## 2015-08-31 MED ORDER — OXYCODONE HCL 5 MG PO TABS
5.0000 mg | ORAL_TABLET | ORAL | Status: DC | PRN
Start: 2015-08-31 — End: 2015-09-14
  Administered 2015-08-31 – 2015-09-13 (×7): 5 mg via ORAL
  Filled 2015-08-31 (×7): qty 1

## 2015-08-31 MED ORDER — VANCOMYCIN HCL 10 G IV SOLR
1250.0000 mg | INTRAVENOUS | Status: DC
  Administered 2015-09-01: 1250 mg via INTRAVENOUS
  Filled 2015-08-31: qty 1250

## 2015-08-31 MED ORDER — VANCOMYCIN HCL IN DEXTROSE 1-5 GM/200ML-% IV SOLN
1000.0000 mg | Freq: Once | INTRAVENOUS | Status: AC
  Administered 2015-08-31: 1000 mg via INTRAVENOUS
  Filled 2015-08-31: qty 200

## 2015-08-31 MED ORDER — SODIUM CHLORIDE 0.9 % IV SOLN
INTRAVENOUS | Status: AC
  Administered 2015-08-31: 18:00:00 via INTRAVENOUS

## 2015-08-31 MED ORDER — SODIUM CHLORIDE 0.9 % IV BOLUS (SEPSIS)
500.0000 mL | Freq: Once | INTRAVENOUS | Status: AC
  Administered 2015-08-31: 500 mL via INTRAVENOUS

## 2015-08-31 MED ORDER — IOHEXOL 300 MG/ML  SOLN
75.0000 mL | Freq: Once | INTRAMUSCULAR | Status: AC | PRN
  Administered 2015-08-31: 75 mL via INTRAVENOUS

## 2015-08-31 MED ORDER — LIDOCAINE HCL (PF) 1 % IJ SOLN
INTRAMUSCULAR | Status: AC
  Administered 2015-08-31: 5 mL
  Filled 2015-08-31: qty 5

## 2015-08-31 MED ORDER — VANCOMYCIN HCL IN DEXTROSE 750-5 MG/150ML-% IV SOLN
750.0000 mg | Freq: Two times a day (BID) | INTRAVENOUS | Status: DC
Start: 2015-08-31 — End: 2015-08-31

## 2015-08-31 NOTE — ED Provider Notes (Signed)
CSN: [id number]     Arrival date & time 08/31/15  1407 History   First MD Initiated Contact with Patient 08/31/15 1503     Chief Complaint  Patient presents with  . Wound Infection     (Consider location/radiation/quality/duration/timing/severity/associated sxs/prior Treatment) HPI Patient presents with swelling to the surgical site in the midline chest. She underwent coronary artery bypass grafting 3 on 1/21. Dr. [name] was her surgeon. She was seen 3 days ago for postoperative appointment. Doing well at that time. Dr. states that the patient had external swelling and tenderness starting yesterday. It then began spontaneously draining through the surgical wound. Daughter states there was profuse amount of purulent liquid. Patient denies any fever or chills. Says the pain has improved in the chest since drainage occurred. Her energy level has improved. She currently denies any chest pain or shortness of breath. She's had no nausea, vomiting or abdominal pain. No new lower extremity swelling.  Past Medical History  Diagnosis Date  . Hypertension   . Anxiety about health 08/28/2015   Past Surgical History  Procedure Laterality Date  . Back surgery    . Abdominal hysterectomy    . Appendectomy    . Colonoscopy N/A 03/29/2013    Procedure: COLONOSCOPY;  Surgeon: Rogene Houston, MD;  [facility];  Service: Endoscopy;  Laterality: N/A;  930  . Cardiac catheterization N/A 08/10/2015    Procedure: Left Heart Cath and Coronary Angiography;  Surgeon: [name], MD;  Location: [place] [facility];  Service: Cardiovascular;  Laterality: N/A;  . Cardiac catheterization  08/10/2015    Procedure: Coronary Balloon Angioplasty;  Surgeon: [name], MD;  Location: [place] [facility];  Service: Cardiovascular;;  . Coronary artery bypass graft N/A 08/14/2015    Procedure: CORONARY ARTERY BYPASS GRAFTING (CABG)X3 LIMA-LAD; SVG-RAMUS; SVG-PD TRANSESOPHAGEAL ECHOCARDIOGRAM (TEE);   Surgeon: [name], MD;  Location: [place];  Service: Open Heart Surgery;  Laterality: N/A;  . Tee without cardioversion N/A 08/14/2015    Procedure: TRANSESOPHAGEAL ECHOCARDIOGRAM (TEE);  Surgeon: [name], MD;  Location: [place];  Service: Open Heart Surgery;  Laterality: N/A;   Family History  Problem Relation Age of Onset  . Colon cancer Neg Hx    Social History  Substance Use Topics  . Smoking status: Never Smoker   . Smokeless tobacco: None  . Alcohol Use: No   OB History    No data available     Review of Systems  Constitutional: Negative for fever, chills and fatigue.  Respiratory: Negative for shortness of breath.   Cardiovascular: Positive for chest pain. Negative for leg swelling.  Gastrointestinal: Negative for nausea, vomiting and abdominal pain.  Musculoskeletal: Negative for back pain and neck pain.  Skin: Positive for color change and wound.  Neurological: Negative for dizziness, weakness, light-headedness, numbness and headaches.  All other systems reviewed and are negative.     Allergies  Other and Peanut-containing drug products  Home Medications   Prior to Admission medications   Medication Sig Start Date End Date Taking? Authorizing Provider  ALPRAZolam (XANAX) 0.25 MG tablet Take 0.25 mg by mouth as needed. .25 08/28/15  Yes Historical Provider, MD  aspirin EC 325 MG EC tablet Take 1 tablet (325 mg total) by mouth daily. 08/22/15  Yes Wayne E Gold, PA-C  atorvastatin (LIPITOR) 80 MG tablet Take 1 tablet (80 mg total) by mouth daily at 6 PM. 08/22/15  Yes John Giovanni, PA-C  docusate sodium (DOCU SOFT) 100 MG capsule Take 100 mg by mouth 2 (two) times daily.   Yes Historical Provider, MD  furosemide (LASIX) 20 MG tablet Take 1 tablet (20 mg total) by mouth daily as needed. Take with Potassium 08/28/15  Yes Lendon Colonel, NP  losartan (COZAAR) 25 MG tablet Take 1 tablet (25 mg total) by mouth daily. 08/28/15  Yes Lendon Colonel, NP  metoprolol  (LOPRESSOR) 50 MG tablet Take 1.5 tablets (75 mg total) by mouth 2 (two) times daily. 08/28/15  Yes Lendon Colonel, NP  oxyCODONE (OXY IR/ROXICODONE) 5 MG immediate release tablet Take 1 tablet (5 mg total) by mouth every 4 (four) hours as needed for moderate pain or severe pain. 08/22/15  Yes Wayne E Gold, PA-C  potassium chloride SA (K-DUR,KLOR-CON) 20 MEQ tablet Take 1 tablet (20 mEq total) by mouth daily. 08/29/15  Yes Lendon Colonel, NP   BP 127/60 mmHg  Pulse 94  Temp(Src) 99 F (37.2 C) (Oral)  Resp 18  SpO2 92% Physical Exam  Constitutional: She is oriented to person, place, and time. She appears well-developed and well-nourished. No distress.  HENT:  Head: Normocephalic and atraumatic.  Mouth/Throat: Oropharynx is clear and moist. No oropharyngeal exudate.  Eyes: EOM are normal. Pupils are equal, round, and reactive to light.  Neck: Normal range of motion. Neck supple.  Cardiovascular: Normal rate and regular rhythm.  Exam reveals no gallop and no friction rub.   No murmur heard. Pulmonary/Chest: Effort normal and breath sounds normal. No respiratory distress. She has no wheezes. She has no rales. She exhibits tenderness.  At the superior end of the midline surgical wound there is surrounding erythema and mild swelling. Purulence is expressed through the surgical wound patient also has tenderness with palpation of the superior part of the wound. There is mild warmth.   Abdominal: Soft. Bowel sounds are normal. She exhibits no distension and no mass. There is no tenderness. There is no rebound and no guarding.  Musculoskeletal: Normal range of motion. She exhibits no edema or tenderness.  No lower extremity swelling or pain. Distal pulses equal and intact.  Neurological: She is alert and oriented to person, place, and time.  5/5 motor in all extremities. Sensation is fully intact.  Skin: Skin is warm and dry. No rash noted. There is erythema.  Psychiatric: She has a normal mood  and affect. Her behavior is normal.  Nursing note and vitals reviewed.   ED Course  Procedures (including critical care time) Labs Review Labs Reviewed  CBC WITH DIFFERENTIAL/PLATELET - Abnormal; Notable for the following:    WBC 11.2 (*)    RBC 3.60 (*)    Hemoglobin 10.1 (*)    HCT 31.3 (*)    Neutro Abs 9.1 (*)    All other components within normal limits  COMPREHENSIVE METABOLIC PANEL - Abnormal; Notable for the following:    Sodium 134 (*)    Chloride 99 (*)    Glucose, Bld 119 (*)    BUN 32 (*)    Creatinine, Ser 1.09 (*)    Calcium 8.7 (*)    Albumin 2.6 (*)    Alkaline Phosphatase 160 (*)    GFR calc non Af Amer 49 (*)    GFR calc Af Amer 57 (*)    All other components within normal limits  CULTURE, BLOOD (ROUTINE X 2)  CULTURE, BLOOD (ROUTINE X 2)  WOUND CULTURE  I-STAT CG4 LACTIC ACID, ED  Imaging Review Ct Chest W Contrast  08/31/2015  CLINICAL DATA:  CABG 08/14/2015. Now with redness and drainage from incision. EXAM: CT CHEST WITH CONTRAST TECHNIQUE: Multidetector CT imaging of the chest was performed during intravenous contrast administration. CONTRAST:  48mL OMNIPAQUE IOHEXOL 300 MG/ML  SOLN COMPARISON:  Chest two view 08/19/2015 FINDINGS: Extensive retrosternal fluid collection with rim enhancing wall consistent with abscess. This is anterior to the pericardium and posterior to the sternum. Fluid extends into the sternal notch were there is a 2 cm fluid collection containing gas bubbles. Findings are consistent with abscess. The retrosternal fluid collection measures approximately 19 x 53 mm on transverse images. There is thickening of the pericardium but no pericardial effusion. Coronary calcification. Heart size normal. Small bilateral pleural effusions with dependent atelectasis in both lung bases. Negative for pneumonia. Negative for mass or adenopathy. No thoracic spine fracture. IMPRESSION: Large retrosternal abscess extending into the sternal notch.  Pericardial thickening without pericardial effusion Small bilateral pleural effusions with bibasilar atelectasis. These results were called by telephone at the time of interpretation on 08/31/2015 at 5:39 pm to Dr. Julianne Rice , who verbally acknowledged these results. Electronically Signed   By: Franchot Gallo M.D.   On: 08/31/2015 17:39   I have personally reviewed and evaluated these images and lab results as part of my medical decision-making.   EKG Interpretation None     CRITICAL CARE Performed by: Lita Mains, [place] Total critical care time: 30 minutes Critical care time was exclusive of separately billable procedures and treating other patients. Critical care was necessary to treat or prevent imminent or life-threatening deterioration. Critical care was time spent personally by me on the following activities: development of treatment plan with patient and/or surrogate as well as nursing, discussions with consultants, evaluation of patient's response to treatment, examination of patient, obtaining history from patient or surrogate, ordering and performing treatments and interventions, ordering and review of laboratory studies, ordering and review of radiographic studies, pulse oximetry and re-evaluation of patient's condition. MDM   Final diagnoses:  Retrosternal abscess ([place])   There is evidence of postop wound abscess. Wound culture collected. Will get Ct to eval extent of abscess. Pt is well appearing and in no distress.    Discussed CT findings with Dr. Pia Mau. Agrees with IV vancomycin and will accept the patient in transfer to [facility] ICU. Asked to keep the patient NPO. Patient states she last ate and drank at 1:30 this afternoon.  Julianne Rice, MD 08/31/15 [phone]

## 2015-08-31 NOTE — Progress Notes (Signed)
ANTIBIOTIC CONSULT NOTE - INITIAL  Pharmacy Consult for vancomycin  Indication: wound infection  Allergies  Allergen Reactions  . Other Hives and Itching    Food Dye Red Dye #40  . Peanut-Containing Drug Products Rash    Mild rash    Patient Measurements:    Vital Signs: Temp: 98 F (36.7 C) (02/05 2008) Temp Source: Oral (02/05 2008) BP: 128/62 mmHg (02/05 1911) Pulse Rate: 98 (02/05 1911) Intake/Output from previous day:   Intake/Output from this shift:    Labs:  Recent Labs  08/31/15 1544  WBC 11.2*  HGB 10.1*  PLT 374  CREATININE 1.09*   Estimated Creatinine Clearance: 49 mL/min (by C-G formula based on Cr of 1.09). No results for input(s): VANCOTROUGH, VANCOPEAK, VANCORANDOM, GENTTROUGH, GENTPEAK, GENTRANDOM, TOBRATROUGH, TOBRAPEAK, TOBRARND, AMIKACINPEAK, AMIKACINTROU, AMIKACIN in the last 72 hours.   Microbiology: Recent Results (from the past 720 hour(s))  Surgical pcr screen     Status: None   Collection Time: 08/10/15  6:30 PM  Result Value Ref Range Status   MRSA, PCR NEGATIVE NEGATIVE Final   Staphylococcus aureus NEGATIVE NEGATIVE Final    Comment:        The Xpert SA Assay (FDA approved for NASAL specimens in patients over 7 years of age), is one component of a comprehensive surveillance program.  Test performance has been validated by [facility] for patients greater than or equal to 19 year old. It is not intended to diagnose infection nor to guide or monitor treatment.   Culture, blood (Routine X 2) w Reflex to ID Panel     Status: None (Preliminary result)   Collection Time: 08/31/15  3:44 PM  Result Value Ref Range Status   Specimen Description BLOOD IV DRAW  Final   Special Requests BOTTLES DRAWN AEROBIC AND ANAEROBIC 4CC EACH  Final   Culture PENDING  Incomplete   Report Status PENDING  Incomplete  Culture, blood (Routine X 2) w Reflex to ID Panel     Status: None (Preliminary result)   Collection Time: 08/31/15  3:49 PM   Result Value Ref Range Status   Specimen Description LEFT ANTECUBITAL  Final   Special Requests BOTTLES DRAWN AEROBIC AND ANAEROBIC [facility] EACH  Final   Culture PENDING  Incomplete   Report Status PENDING  Incomplete    Medical History: Past Medical History  Diagnosis Date  . Hypertension   . Anxiety about health 08/28/2015   Assessment: 75 year old female who underwent CABG 1/21. Now with postop wound abscess, would culture collected. New orders to start IV vancomycin.  No fevers noted, wbc 11, scr normal at 1.0.  Goal of Therapy:  Vancomycin trough level 10-15 mcg/ml  Plan:  Measure antibiotic drug levels at steady state Follow up culture results Vancomycin 1g given in ED, continue with 1250mg  q24 hours  Erin Hearing PharmD., BCPS Clinical Pharmacist Pager [phone] 08/31/2015 8:42 PM

## 2015-08-31 NOTE — H&P (Signed)
[address]       [place] [address]             [phone]        Gordon M Schaafsma Puckett Medical Record [id number] Date of Birth: [birth year]  Referring: No ref. provider found Primary Care: Lanette Hampshire, MD  Chief Complaint:    Chief Complaint  Patient presents with  . Wound Infection    History of Present Illness:     Patient sp cabg 1/19. Doing well post op until yesterday when she had some swelling at top of incision, but no fever or chills, Today wound was more swollen and red with drainage. She went to [facility] ER. Ct done and transferred here by EMS [facility] co   Current Activity/ Functional Status: Patient is independent with mobility/ambulation, transfers, ADL's, IADL's.   Zubrod Score: At the time of surgery this patient's most appropriate activity status/level should be described as: []     0    Normal activity, no symptoms [x]     1    Restricted in physical strenuous activity but ambulatory, able to do out light work []     2    Ambulatory and capable of self care, unable to do work activities, up and about                 more than 50%  Of the time                            []     3    Only limited self care, in bed greater than 50% of waking hours []     4    Completely disabled, no self care, confined to bed or chair []     5    Moribund  Past Medical History  Diagnosis Date  . Hypertension   . Anxiety about health 08/28/2015    Past Surgical History  Procedure Laterality Date  . Back surgery    . Abdominal hysterectomy    . Appendectomy    . Colonoscopy N/A 03/29/2013    Procedure: COLONOSCOPY;  Surgeon: Rogene Houston, MD;  [facility];  Service: Endoscopy;  Laterality: N/A;  930  . Cardiac catheterization N/A 08/10/2015    Procedure: Left Heart Cath and Coronary Angiography;  Surgeon: [name], MD;  Location: [place] [facility];  Service: Cardiovascular;  Laterality: N/A;  . Cardiac catheterization   08/10/2015    Procedure: Coronary Balloon Angioplasty;  Surgeon: [name], MD;  Location: [place] [facility];  Service: Cardiovascular;;  . Coronary artery bypass graft N/A 08/14/2015    Procedure: CORONARY ARTERY BYPASS GRAFTING (CABG)X3 LIMA-LAD; SVG-RAMUS; SVG-PD TRANSESOPHAGEAL ECHOCARDIOGRAM (TEE);  Surgeon: [name], MD;  Location: [place];  Service: Open Heart Surgery;  Laterality: N/A;  . Tee without cardioversion N/A 08/14/2015    Procedure: TRANSESOPHAGEAL ECHOCARDIOGRAM (TEE);  Surgeon: [name], MD;  Location: [place];  Service: Open Heart Surgery;  Laterality: N/A;    History  Smoking status  . Never Smoker   Smokeless tobacco  . Not on file    History  Alcohol Use No    Social History   Social History  . Marital Status: Divorced    Spouse Name: N/A  . Number of Children: N/A  . Years of Education: N/A   Occupational History  . Not on file.  Social History Main Topics  . Smoking status: Never Smoker   . Smokeless tobacco: Not on file  . Alcohol Use: No  . Drug Use: No  . Sexual Activity: Not on file   Other Topics Concern  . Not on file   Social History Narrative    Allergies  Allergen Reactions  . Other Hives and Itching    Food Dye Red Dye #40  . Peanut-Containing Drug Products Rash    Mild rash    Current Facility-Administered Medications  Medication Dose Route Frequency Provider Last Rate Last Dose  . 0.9 %  sodium chloride infusion   Intravenous STAT Julianne Rice, MD 75 mL/hr at 08/31/15 1805      Prescriptions prior to admission  Medication Sig Dispense Refill Last Dose  . ALPRAZolam (XANAX) 0.25 MG tablet Take 0.25 mg by mouth as needed. .25  0 08/30/2015 at Unknown time  . aspirin EC 325 MG EC tablet Take 1 tablet (325 mg total) by mouth daily.   08/31/2015 at Unknown time  . atorvastatin (LIPITOR) 80 MG tablet Take 1 tablet (80 mg total) by mouth daily at 6 PM. 30 tablet 1 08/30/2015 at Unknown time  . docusate sodium  (DOCU SOFT) 100 MG capsule Take 100 mg by mouth 2 (two) times daily.   08/31/2015 at Unknown time  . furosemide (LASIX) 20 MG tablet Take 1 tablet (20 mg total) by mouth daily as needed. Take with Potassium 30 tablet 6 Past Week at Unknown time  . losartan (COZAAR) 25 MG tablet Take 1 tablet (25 mg total) by mouth daily. 90 tablet 3 08/31/2015 at Unknown time  . metoprolol (LOPRESSOR) 50 MG tablet Take 1.5 tablets (75 mg total) by mouth 2 (two) times daily. 90 tablet 6 08/31/2015 at 730  . oxyCODONE (OXY IR/ROXICODONE) 5 MG immediate release tablet Take 1 tablet (5 mg total) by mouth every 4 (four) hours as needed for moderate pain or severe pain. 50 tablet 0 08/31/2015 at Unknown time  . potassium chloride SA (K-DUR,KLOR-CON) 20 MEQ tablet Take 1 tablet (20 mEq total) by mouth daily. 30 tablet 6 Past Week at Unknown time    Family History  Problem Relation Age of Onset  . Colon cancer Neg Hx      Review of Systems:         Cardiac Review of Systems: Y or N  Chest Pain [  y  ]  Resting SOB [ n  ] Exertional SOB  [ n ]  Orthopnea [ n ]   Pedal Edema [ n  ]    Palpitations [ n ] Syncope  [ n ]   Presyncope [ n  ]  General Review of Systems: [Y] = yes [  ]=no Constitional: recent weight change [  ]; anorexia [  ]; fatigue [  ]; nausea [  ]; night sweats [  ]; fever [  ]; or chills [  ]                                                               Dental: poor dentition[  ]; Last Dentist visit:   Eye : blurred vision [  ]; diplopia [   ]; vision changes [  ];  Amaurosis fugax[  ];  Resp: cough [  ];  wheezing[  ];  hemoptysis[  ]; shortness of breath[  ]; paroxysmal nocturnal dyspnea[  ]; dyspnea on exertion[  ]; or orthopnea[  ];  GI:  gallstones[  ], vomiting[  ];  dysphagia[  ]; melena[  ];  hematochezia [  ]; heartburn[  ];   Hx of  Colonoscopy[  ]; GU: kidney stones [  ]; hematuria[  ];   dysuria [  ];  nocturia[  ];  history of     obstruction [  ]; urinary frequency [  ]             Skin:  rash, swelling[  ];, hair loss[  ];  peripheral edema[  ];  or itching[  ]; Musculosketetal: myalgias[  ];  joint swelling[  ];  joint erythema[  ];  joint pain[  ];  back pain[  ];  Heme/Lymph: bruising[  ];  bleeding[  ];  anemia[  ];  Neuro: TIA[  ];  headaches[  ];  stroke[  ];  vertigo[  ];  seizures[  ];   paresthesias[  ];  difficulty walking[  ];  Psych:depression[  ]; anxiety[  ];  Endocrine: diabetes[  ]n;  thyroid dysfunction[  ];  Immunizations: Flu [  ]; Pneumococcal[  ];  Other:  Physical Exam: BP 128/62 mmHg  Pulse 98  Temp(Src) 98 F (36.7 C) (Oral)  Resp 16  SpO2 94%   General appearance: alert, cooperative and no distress Head: Normocephalic, without obvious abnormality, atraumatic Neck: no adenopathy, no carotid bruit, no JVD, supple, symmetrical, trachea midline and thyroid not enlarged, symmetric, no tenderness/mass/nodules Lymph nodes: Cervical, supraclavicular, and axillary nodes normal. Resp: diminished breath sounds bibasilar Back: symmetric, no curvature. ROM normal. No CVA tenderness. Cardio: regular rate and rhythm, S1, S2 normal, no murmur, click, rub or gallop GI: soft, non-tender; bowel sounds normal; no masses,  no organomegaly Extremities: extremities normal, atraumatic, no cyanosis or edema and Homans sign is negative, no sign of DVT Neurologic: Grossly normal Wound see photo, sternum stable on exam   Upper wound opened at bed side  and cultured   Diagnostic Studies & Laboratory data:     Recent Radiology Findings:   Ct Chest W Contrast  08/31/2015  CLINICAL DATA:  CABG 08/14/2015. Now with redness and drainage from incision. EXAM: CT CHEST WITH CONTRAST TECHNIQUE: Multidetector CT imaging of the chest was performed during intravenous contrast administration. CONTRAST:  54mL OMNIPAQUE IOHEXOL 300 MG/ML  SOLN COMPARISON:  Chest two view 08/19/2015 FINDINGS: Extensive retrosternal fluid collection with rim enhancing wall consistent with abscess.  This is anterior to the pericardium and posterior to the sternum. Fluid extends into the sternal notch were there is a 2 cm fluid collection containing gas bubbles. Findings are consistent with abscess. The retrosternal fluid collection measures approximately 19 x 53 mm on transverse images. There is thickening of the pericardium but no pericardial effusion. Coronary calcification. Heart size normal. Small bilateral pleural effusions with dependent atelectasis in both lung bases. Negative for pneumonia. Negative for mass or adenopathy. No thoracic spine fracture. IMPRESSION: Large retrosternal abscess extending into the sternal notch. Pericardial thickening without pericardial effusion Small bilateral pleural effusions with bibasilar atelectasis. These results were called by telephone at the time of interpretation on 08/31/2015 at 5:39 pm to Dr. Julianne Rice , who verbally acknowledged these results. Electronically Signed   By: Franchot Gallo M.D.   On: 08/31/2015 17:39     I have  independently reviewed the above radiologic studies.  Recent Lab Findings: Lab Results  Component Value Date   WBC 11.2* 08/31/2015   HGB 10.1* 08/31/2015   HCT 31.3* 08/31/2015   PLT 374 08/31/2015   GLUCOSE 119* 08/31/2015   CHOL 157 08/11/2015   TRIG 73 08/11/2015   HDL 51 08/11/2015   LDLCALC 91 08/11/2015   ALT 36 08/31/2015   AST 41 08/31/2015   NA 134* 08/31/2015   K 4.1 08/31/2015   CL 99* 08/31/2015   CREATININE 1.09* 08/31/2015   BUN 32* 08/31/2015   CO2 25 08/31/2015   TSH 2.467 08/11/2015   INR 1.42 08/14/2015   HGBA1C 6.1* 08/11/2015      Assessment / Plan:   Sternal wound infection , s/p CABG 08/14/2015 Wound was opened at top at bedside further cultures were done, will plan wound debridement and poss vac in am under general by Dr Darcey Nora  Renal function ok Started  on Vancomycin, pharmacy consult for dosing placed   Discussed with  Patient and family   I  spent 40 minutes counseling  the patient face to face and 50% or more the  time was spent in counseling and coordination of care. The total time spent in the appointment was 60 minutes.    Grace Isaac MD      [address] [place], [address] Office [phone]   Beeper [phone]  08/31/2015 8:24 PM

## 2015-08-31 NOTE — ED Notes (Signed)
Pt had recent triple bypass surgery. Pt has wound down center of chest. Daughter noticed that area was becoming red over the last day with draining from area starting today. Pt denies any pain.

## 2015-09-01 ENCOUNTER — Inpatient Hospital Stay (HOSPITAL_COMMUNITY): Payer: Medicare PPO

## 2015-09-01 ENCOUNTER — Encounter (HOSPITAL_COMMUNITY)
Admission: EM | Disposition: A | Discharge status: Home Health | Payer: Self-pay | Source: Home / Self Care | Attending: Cardiothoracic Surgery

## 2015-09-01 ENCOUNTER — Inpatient Hospital Stay (HOSPITAL_COMMUNITY): Payer: Medicare PPO | Admitting: Anesthesiology

## 2015-09-01 ENCOUNTER — Encounter (HOSPITAL_COMMUNITY): Payer: Self-pay | Admitting: Anesthesiology

## 2015-09-01 DIAGNOSIS — T8149XA Infection following a procedure, other surgical site, initial encounter: Secondary | ICD-10-CM | POA: Diagnosis present

## 2015-09-01 DIAGNOSIS — T814XXA Infection following a procedure, initial encounter: Secondary | ICD-10-CM

## 2015-09-01 HISTORY — PX: STERNAL WOUND DEBRIDEMENT: SHX1058

## 2015-09-01 LAB — COMPREHENSIVE METABOLIC PANEL
ALT: 28 U/L (ref 14–54)
AST: 28 U/L (ref 15–41)
Albumin: 2 g/dL — ABNORMAL LOW (ref 3.5–5.0)
Alkaline Phosphatase: 145 U/L — ABNORMAL HIGH (ref 38–126)
Anion gap: 10 (ref 5–15)
BUN: 21 mg/dL — ABNORMAL HIGH (ref 6–20)
CO2: 23 mmol/L (ref 22–32)
Calcium: 8.1 mg/dL — ABNORMAL LOW (ref 8.9–10.3)
Chloride: 102 mmol/L (ref 101–111)
Creatinine, Ser: 0.86 mg/dL (ref 0.44–1.00)
GFR calc Af Amer: >60 mL/min (ref >60)
GFR calc non Af Amer: >60 mL/min (ref >60)
Glucose, Bld: 152 mg/dL — ABNORMAL HIGH (ref 65–99)
Potassium: 3.7 mmol/L (ref 3.5–5.1)
Sodium: 135 mmol/L (ref 135–145)
Total Bilirubin: 1 mg/dL (ref 0.3–1.2)
Total Protein: 5.8 g/dL — ABNORMAL LOW (ref 6.5–8.1)

## 2015-09-01 LAB — CBC
HCT: 28.5 % — ABNORMAL LOW (ref 36.0–46.0)
HCT: 29.5 % — ABNORMAL LOW (ref 36.0–46.0)
Hemoglobin: 9.4 g/dL — ABNORMAL LOW (ref 12.0–15.0)
Hemoglobin: 9.4 g/dL — ABNORMAL LOW (ref 12.0–15.0)
MCH: 28.1 pg (ref 26.0–34.0)
MCH: 27.1 pg (ref 26.0–34.0)
MCHC: 33 g/dL (ref 30.0–36.0)
MCHC: 31.9 g/dL (ref 30.0–36.0)
MCV: 85.1 fL (ref 78.0–100.0)
MCV: 85 fL (ref 78.0–100.0)
Platelets: 311 10*3/uL (ref 150–400)
Platelets: 288 10*3/uL (ref 150–400)
RBC: 3.35 MIL/uL — ABNORMAL LOW (ref 3.87–5.11)
RBC: 3.47 MIL/uL — ABNORMAL LOW (ref 3.87–5.11)
RDW: 14.6 % (ref 11.5–15.5)
RDW: 14.7 % (ref 11.5–15.5)
WBC: 9 10*3/uL (ref 4.0–10.5)
WBC: 8.6 10*3/uL (ref 4.0–10.5)

## 2015-09-01 LAB — BASIC METABOLIC PANEL
Anion gap: 9 (ref 5–15)
BUN: 14 mg/dL (ref 6–20)
CO2: 21 mmol/L — ABNORMAL LOW (ref 22–32)
Calcium: 7.8 mg/dL — ABNORMAL LOW (ref 8.9–10.3)
Chloride: 99 mmol/L — ABNORMAL LOW (ref 101–111)
Creatinine, Ser: 0.78 mg/dL (ref 0.44–1.00)
GFR calc Af Amer: >60 mL/min (ref >60)
GFR calc non Af Amer: >60 mL/min (ref >60)
Glucose, Bld: 159 mg/dL — ABNORMAL HIGH (ref 65–99)
Potassium: 4.2 mmol/L (ref 3.5–5.1)
Sodium: 129 mmol/L — ABNORMAL LOW (ref 135–145)

## 2015-09-01 LAB — PREPARE RBC (CROSSMATCH)

## 2015-09-01 SURGERY — DEBRIDEMENT, WOUND, STERNUM
Anesthesia: General | Site: Chest

## 2015-09-01 MED ORDER — SODIUM CHLORIDE 0.9% FLUSH
10.0000 mL | Freq: Two times a day (BID) | INTRAVENOUS | Status: DC
  Administered 2015-09-02 – 2015-09-12 (×14): 10 mL

## 2015-09-01 MED ORDER — SODIUM CHLORIDE 0.9 % IR SOLN
Status: DC | PRN
  Administered 2015-09-01: 1000 mL

## 2015-09-01 MED ORDER — METOPROLOL TARTRATE 25 MG PO TABS
75.0000 mg | ORAL_TABLET | Freq: Two times a day (BID) | ORAL | Status: DC
  Administered 2015-09-02 – 2015-09-07 (×10): 75 mg via ORAL
  Filled 2015-09-01 (×10): qty 1

## 2015-09-01 MED ORDER — NEOSTIGMINE METHYLSULFATE 10 MG/10ML IV SOLN
INTRAVENOUS | Status: DC | PRN
  Administered 2015-09-01: 3 mg via INTRAVENOUS

## 2015-09-01 MED ORDER — BOOST / RESOURCE BREEZE PO LIQD
237.0000 mL | Freq: Three times a day (TID) | ORAL | Status: DC
  Administered 2015-09-01 – 2015-09-11 (×15): 1 via ORAL

## 2015-09-01 MED ORDER — SODIUM CHLORIDE 0.9% FLUSH
10.0000 mL | INTRAVENOUS | Status: DC | PRN
  Administered 2015-09-06 – 2015-09-08 (×2): 10 mL
  Administered 2015-09-12: 20 mL
  Filled 2015-09-01 (×3): qty 40

## 2015-09-01 MED ORDER — ASPIRIN EC 325 MG PO TBEC
325.0000 mg | DELAYED_RELEASE_TABLET | Freq: Every day | ORAL | Status: DC
  Administered 2015-09-02 – 2015-09-07 (×5): 325 mg via ORAL
  Filled 2015-09-01 (×5): qty 1

## 2015-09-01 MED ORDER — LACTATED RINGERS IV SOLN
INTRAVENOUS | Status: DC | PRN
  Administered 2015-09-01 (×2): via INTRAVENOUS

## 2015-09-01 MED ORDER — TRAMADOL HCL 50 MG PO TABS
50.0000 mg | ORAL_TABLET | Freq: Four times a day (QID) | ORAL | Status: DC | PRN
  Administered 2015-09-01 – 2015-09-03 (×4): 100 mg via ORAL
  Administered 2015-09-07 – 2015-09-08 (×2): 50 mg via ORAL
  Administered 2015-09-09: 100 mg via ORAL
  Administered 2015-09-09 – 2015-09-10 (×2): 50 mg via ORAL
  Filled 2015-09-01: qty 2
  Filled 2015-09-01: qty 1
  Filled 2015-09-01: qty 2
  Filled 2015-09-01: qty 1
  Filled 2015-09-01: qty 2
  Filled 2015-09-01 (×2): qty 1
  Filled 2015-09-01 (×2): qty 2

## 2015-09-01 MED ORDER — VANCOMYCIN HCL IN DEXTROSE 1-5 GM/200ML-% IV SOLN
1000.0000 mg | Freq: Two times a day (BID) | INTRAVENOUS | Status: DC
  Administered 2015-09-01 – 2015-09-05 (×8): 1000 mg via INTRAVENOUS
  Filled 2015-09-01 (×10): qty 200

## 2015-09-01 MED ORDER — MIDAZOLAM HCL 2 MG/2ML IJ SOLN
INTRAMUSCULAR | Status: AC
  Filled 2015-09-01: qty 2

## 2015-09-01 MED ORDER — GLYCOPYRROLATE 0.2 MG/ML IJ SOLN
INTRAMUSCULAR | Status: AC
  Filled 2015-09-01: qty 1

## 2015-09-01 MED ORDER — BISACODYL 5 MG PO TBEC
10.0000 mg | DELAYED_RELEASE_TABLET | Freq: Every day | ORAL | Status: DC
  Filled 2015-09-01: qty 2

## 2015-09-01 MED ORDER — ONDANSETRON HCL 4 MG/2ML IJ SOLN
4.0000 mg | Freq: Four times a day (QID) | INTRAMUSCULAR | Status: DC | PRN
Start: 2015-09-01 — End: 2015-09-14
  Administered 2015-09-02 – 2015-09-07 (×4): 4 mg via INTRAVENOUS
  Filled 2015-09-01 (×3): qty 2

## 2015-09-01 MED ORDER — ATORVASTATIN CALCIUM 80 MG PO TABS
80.0000 mg | ORAL_TABLET | Freq: Every day | ORAL | Status: DC
  Administered 2015-09-01 – 2015-09-13 (×13): 80 mg via ORAL
  Filled 2015-09-01 (×13): qty 1

## 2015-09-01 MED ORDER — VANCOMYCIN HCL 1000 MG IV SOLR
INTRAVENOUS | Status: DC | PRN
  Administered 2015-09-01 (×3): 1000 mg

## 2015-09-01 MED ORDER — ONDANSETRON HCL 4 MG/2ML IJ SOLN
INTRAMUSCULAR | Status: AC
  Filled 2015-09-01: qty 2

## 2015-09-01 MED ORDER — PANTOPRAZOLE SODIUM 40 MG PO TBEC
40.0000 mg | DELAYED_RELEASE_TABLET | Freq: Every day | ORAL | Status: DC
  Administered 2015-09-01 – 2015-09-13 (×11): 40 mg via ORAL
  Filled 2015-09-01 (×11): qty 1

## 2015-09-01 MED ORDER — OXYCODONE HCL 5 MG PO TABS: 5.0000 mg | ORAL_TABLET | ORAL | Status: DC | PRN

## 2015-09-01 MED ORDER — SODIUM CHLORIDE 0.9 % IR SOLN
Status: DC | PRN
  Administered 2015-09-01: 3000 mL

## 2015-09-01 MED ORDER — FENTANYL CITRATE (PF) 100 MCG/2ML IJ SOLN
50.0000 ug | INTRAMUSCULAR | Status: DC | PRN
  Administered 2015-09-04: 50 ug via INTRAVENOUS
  Administered 2015-09-04: 100 ug via INTRAVENOUS

## 2015-09-01 MED ORDER — FENTANYL CITRATE (PF) 250 MCG/5ML IJ SOLN
INTRAMUSCULAR | Status: AC
  Filled 2015-09-01: qty 5

## 2015-09-01 MED ORDER — SENNOSIDES-DOCUSATE SODIUM 8.6-50 MG PO TABS
1.0000 | ORAL_TABLET | Freq: Every day | ORAL | Status: DC
  Administered 2015-09-01 – 2015-09-10 (×6): 1 via ORAL
  Filled 2015-09-01 (×8): qty 1

## 2015-09-01 MED ORDER — PIPERACILLIN-TAZOBACTAM 3.375 G IVPB
3.3750 g | Freq: Three times a day (TID) | INTRAVENOUS | Status: DC
  Administered 2015-09-01 – 2015-09-06 (×14): 3.375 g via INTRAVENOUS
  Filled 2015-09-01 (×17): qty 50

## 2015-09-01 MED ORDER — VANCOMYCIN HCL 10 G IV SOLR
1250.0000 mg | INTRAVENOUS | Status: AC
  Filled 2015-09-01: qty 1250

## 2015-09-01 MED ORDER — POTASSIUM CHLORIDE 10 MEQ/50ML IV SOLN
10.0000 meq | Freq: Every day | INTRAVENOUS | Status: DC | PRN
Start: 2015-09-01 — End: 2015-09-14

## 2015-09-01 MED ORDER — SUCCINYLCHOLINE CHLORIDE 20 MG/ML IJ SOLN
INTRAMUSCULAR | Status: AC
  Filled 2015-09-01: qty 1

## 2015-09-01 MED ORDER — PROPOFOL 10 MG/ML IV BOLUS
INTRAVENOUS | Status: AC
  Filled 2015-09-01: qty 40

## 2015-09-01 MED ORDER — ROCURONIUM BROMIDE 50 MG/5ML IV SOLN
INTRAVENOUS | Status: AC
  Filled 2015-09-01: qty 1

## 2015-09-01 MED ORDER — PIPERACILLIN-TAZOBACTAM 3.375 G IVPB 30 MIN
3.3750 g | INTRAVENOUS | Status: DC
  Filled 2015-09-01: qty 50

## 2015-09-01 MED ORDER — LIDOCAINE HCL (CARDIAC) 20 MG/ML IV SOLN
INTRAVENOUS | Status: AC
  Filled 2015-09-01: qty 5

## 2015-09-01 MED ORDER — LIDOCAINE HCL (CARDIAC) 20 MG/ML IV SOLN
INTRAVENOUS | Status: DC | PRN
  Administered 2015-09-01: 100 mg via INTRAVENOUS

## 2015-09-01 MED ORDER — ACETAMINOPHEN 160 MG/5ML PO SOLN
1000.0000 mg | Freq: Four times a day (QID) | ORAL | Status: AC
  Administered 2015-09-03: 1000 mg via ORAL

## 2015-09-01 MED ORDER — PIPERACILLIN-TAZOBACTAM 3.375 G IVPB
3.3750 g | Freq: Four times a day (QID) | INTRAVENOUS | Status: DC
  Administered 2015-09-01: 3.375 g via INTRAVENOUS

## 2015-09-01 MED ORDER — ACETAMINOPHEN 500 MG PO TABS
1000.0000 mg | ORAL_TABLET | Freq: Four times a day (QID) | ORAL | Status: AC
  Administered 2015-09-01 – 2015-09-06 (×16): 1000 mg via ORAL
  Filled 2015-09-01 (×16): qty 2

## 2015-09-01 MED ORDER — POLYMYXIN B SULFATE 500000 UNITS IJ SOLR
INTRAMUSCULAR | Status: DC | PRN
  Administered 2015-09-01: 500 mL

## 2015-09-01 MED ORDER — HYDROMORPHONE HCL 1 MG/ML IJ SOLN: 0.5000 mg | INTRAMUSCULAR | Status: DC | PRN

## 2015-09-01 MED ORDER — DEXTROSE 5 % IV SOLN
INTRAVENOUS | Status: AC
  Administered 2015-09-01: 1.5 g via INTRAVENOUS
  Filled 2015-09-01: qty 1.5

## 2015-09-01 MED ORDER — FENTANYL CITRATE (PF) 100 MCG/2ML IJ SOLN
INTRAMUSCULAR | Status: DC | PRN
  Administered 2015-09-01 (×3): 50 ug via INTRAVENOUS
  Administered 2015-09-01: 100 ug via INTRAVENOUS
  Administered 2015-09-01: 150 ug via INTRAVENOUS

## 2015-09-01 MED ORDER — SODIUM CHLORIDE 0.9 % IJ SOLN
INTRAMUSCULAR | Status: AC
  Filled 2015-09-01: qty 10

## 2015-09-01 MED ORDER — PROPOFOL 10 MG/ML IV BOLUS
INTRAVENOUS | Status: DC | PRN
  Administered 2015-09-01: 110 mg via INTRAVENOUS

## 2015-09-01 MED ORDER — ONDANSETRON HCL 4 MG/2ML IJ SOLN: 4.0000 mg | Freq: Once | INTRAMUSCULAR | Status: DC | PRN

## 2015-09-01 MED ORDER — GLYCOPYRROLATE 0.2 MG/ML IJ SOLN
INTRAMUSCULAR | Status: DC | PRN
  Administered 2015-09-01: .4 mg via INTRAVENOUS

## 2015-09-01 MED ORDER — ARTIFICIAL TEARS OP OINT
TOPICAL_OINTMENT | OPHTHALMIC | Status: AC
  Filled 2015-09-01: qty 3.5

## 2015-09-01 MED ORDER — DOCUSATE SODIUM 100 MG PO CAPS
100.0000 mg | ORAL_CAPSULE | Freq: Two times a day (BID) | ORAL | Status: DC
  Administered 2015-09-02 – 2015-09-04 (×4): 100 mg via ORAL
  Filled 2015-09-01 (×4): qty 1

## 2015-09-01 MED ORDER — VANCOMYCIN HCL IN DEXTROSE 1-5 GM/200ML-% IV SOLN
INTRAVENOUS | Status: AC
  Filled 2015-09-01: qty 200

## 2015-09-01 MED ORDER — ROCURONIUM BROMIDE 100 MG/10ML IV SOLN
INTRAVENOUS | Status: DC | PRN
  Administered 2015-09-01: 15 mg via INTRAVENOUS
  Administered 2015-09-01: 25 mg via INTRAVENOUS

## 2015-09-01 MED ORDER — VANCOMYCIN HCL 1000 MG IV SOLR
INTRAVENOUS | Status: AC
  Administered 2015-09-01: 1000 mL
  Filled 2015-09-01: qty 1000

## 2015-09-01 MED ORDER — PHENYLEPHRINE HCL 10 MG/ML IJ SOLN
10.0000 mg | INTRAVENOUS | Status: DC | PRN
  Administered 2015-09-01: 30 ug/min via INTRAVENOUS

## 2015-09-01 MED ORDER — GLYCOPYRROLATE 0.2 MG/ML IJ SOLN
INTRAMUSCULAR | Status: AC
  Filled 2015-09-01: qty 2

## 2015-09-01 MED ORDER — VANCOMYCIN HCL 1000 MG IV SOLR
INTRAVENOUS | Status: AC
  Filled 2015-09-01: qty 3000

## 2015-09-01 MED ORDER — EPHEDRINE SULFATE 50 MG/ML IJ SOLN
INTRAMUSCULAR | Status: AC
  Filled 2015-09-01: qty 1

## 2015-09-01 MED ORDER — MIDAZOLAM HCL 5 MG/5ML IJ SOLN
INTRAMUSCULAR | Status: DC | PRN
  Administered 2015-09-01: 1 mg via INTRAVENOUS

## 2015-09-01 MED ORDER — NEOSTIGMINE METHYLSULFATE 10 MG/10ML IV SOLN
INTRAVENOUS | Status: AC
  Filled 2015-09-01: qty 1

## 2015-09-01 SURGICAL SUPPLY — 66 items
ATTRACTOMAT 16X20 MAGNETIC DRP (DRAPES) ×3 IMPLANT
BAG DECANTER FOR FLEXI CONT (MISCELLANEOUS) ×3 IMPLANT
BENZOIN TINCTURE PRP APPL 2/3 (GAUZE/BANDAGES/DRESSINGS) IMPLANT
BINDER BREAST XLRG (GAUZE/BANDAGES/DRESSINGS) ×3 IMPLANT
BLADE SURG 10 STRL SS (BLADE) ×6 IMPLANT
BNDG GAUZE ELAST 4 BULKY (GAUZE/BANDAGES/DRESSINGS) IMPLANT
CANISTER SUCTION 2500CC (MISCELLANEOUS) ×3 IMPLANT
CANISTER WOUND CARE 500ML ATS (WOUND CARE) ×3 IMPLANT
CATH FOLEY 2WAY SLVR  5CC 16FR (CATHETERS)
CATH FOLEY 2WAY SLVR 5CC 16FR (CATHETERS) IMPLANT
CATH THORACIC 28FR RT ANG (CATHETERS) IMPLANT
CATH THORACIC 36FR (CATHETERS) IMPLANT
CATH THORACIC 36FR RT ANG (CATHETERS) IMPLANT
CLIP TI WIDE RED SMALL 24 (CLIP) IMPLANT
CONN Y 3/8X3/8X3/8  BEN (MISCELLANEOUS)
CONN Y 3/8X3/8X3/8 BEN (MISCELLANEOUS) IMPLANT
CONT SPEC 4OZ CLIKSEAL STRL BL (MISCELLANEOUS) IMPLANT
COVER SURGICAL LIGHT HANDLE (MISCELLANEOUS) ×6 IMPLANT
DRAPE LAPAROSCOPIC ABDOMINAL (DRAPES) ×3 IMPLANT
DRAPE WARM FLUID 44X44 (DRAPE) IMPLANT
DRSG AQUACEL AG ADV 3.5X14 (GAUZE/BANDAGES/DRESSINGS) ×3 IMPLANT
DRSG PAD ABDOMINAL 8X10 ST (GAUZE/BANDAGES/DRESSINGS) IMPLANT
DRSG VAC ATS MED SENSATRAC (GAUZE/BANDAGES/DRESSINGS) ×3 IMPLANT
DRSG VERSA FOAM LRG 10X15 (GAUZE/BANDAGES/DRESSINGS) ×3 IMPLANT
ELECT REM PT RETURN 9FT ADLT (ELECTROSURGICAL) ×3
ELECTRODE REM PT RTRN 9FT ADLT (ELECTROSURGICAL) ×1 IMPLANT
GAUZE SPONGE 4X4 12PLY STRL (GAUZE/BANDAGES/DRESSINGS) ×3 IMPLANT
GAUZE XEROFORM 5X9 LF (GAUZE/BANDAGES/DRESSINGS) IMPLANT
GLOVE BIO SURGEON STRL SZ 6.5 (GLOVE) ×4 IMPLANT
GLOVE BIO SURGEON STRL SZ7.5 (GLOVE) ×3 IMPLANT
GLOVE BIO SURGEONS STRL SZ 6.5 (GLOVE) ×2
GLOVE BIOGEL PI IND STRL 6.5 (GLOVE) ×1 IMPLANT
GLOVE BIOGEL PI INDICATOR 6.5 (GLOVE) ×2
GOWN STRL REUS W/ TWL LRG LVL3 (GOWN DISPOSABLE) ×4 IMPLANT
GOWN STRL REUS W/TWL LRG LVL3 (GOWN DISPOSABLE) ×8
HANDPIECE INTERPULSE COAX TIP (DISPOSABLE) ×2
HEMOSTAT POWDER SURGIFOAM 1G (HEMOSTASIS) IMPLANT
HEMOSTAT SURGICEL 2X14 (HEMOSTASIS) IMPLANT
KIT BASIN OR (CUSTOM PROCEDURE TRAY) ×3 IMPLANT
KIT ROOM TURNOVER OR (KITS) ×3 IMPLANT
KIT SUCTION CATH 14FR (SUCTIONS) IMPLANT
NS IRRIG 1000ML POUR BTL (IV SOLUTION) ×3 IMPLANT
PACK CHEST (CUSTOM PROCEDURE TRAY) ×3 IMPLANT
PAD ARMBOARD 7.5X6 YLW CONV (MISCELLANEOUS) ×6 IMPLANT
SET HNDPC FAN SPRY TIP SCT (DISPOSABLE) ×1 IMPLANT
SOLUTION BETADINE 4OZ (MISCELLANEOUS) IMPLANT
SPONGE LAP 18X18 X RAY DECT (DISPOSABLE) ×3 IMPLANT
SPONGE LAP 4X18 X RAY DECT (DISPOSABLE) ×3 IMPLANT
STAPLER VISISTAT 35W (STAPLE) IMPLANT
STRAP MONTGOMERY 1.25X11-1/8 (MISCELLANEOUS) IMPLANT
SUT ETHILON 3 0 FSL (SUTURE) IMPLANT
SUT STEEL 6MS V (SUTURE) IMPLANT
SUT STEEL STERNAL CCS#1 18IN (SUTURE) IMPLANT
SUT STEEL SZ 6 DBL 3X14 BALL (SUTURE) IMPLANT
SUT VIC AB 1 CTX 36 (SUTURE) ×6
SUT VIC AB 1 CTX36XBRD ANBCTR (SUTURE) ×3 IMPLANT
SUT VIC AB 2-0 CTX 27 (SUTURE) ×6 IMPLANT
SUT VIC AB 3-0 X1 27 (SUTURE) ×9 IMPLANT
SWAB COLLECTION DEVICE MRSA (MISCELLANEOUS) ×3 IMPLANT
SYR 5ML LL (SYRINGE) IMPLANT
TOWEL OR 17X24 6PK STRL BLUE (TOWEL DISPOSABLE) ×3 IMPLANT
TOWEL OR 17X26 10 PK STRL BLUE (TOWEL DISPOSABLE) ×3 IMPLANT
TUBE ANAEROBIC SPECIMEN COL (MISCELLANEOUS) ×3 IMPLANT
TUBING BULK SUCTION (MISCELLANEOUS) ×3 IMPLANT
WATER STERILE IRR 1000ML POUR (IV SOLUTION) ×3 IMPLANT
YANKAUER SUCT BULB TIP NO VENT (SUCTIONS) ×3 IMPLANT

## 2015-09-01 NOTE — Progress Notes (Signed)
Plan chest incision debridement and wound VAC on [name] [name]

## 2015-09-01 NOTE — Progress Notes (Signed)
Peripherally Inserted Central Catheter/Midline Placement  The IV Nurse has discussed with the patient and/or persons authorized to consent for the patient, the purpose of this procedure and the potential benefits and risks involved with this procedure.  The benefits include less needle sticks, lab draws from the catheter and patient may be discharged home with the catheter.  Risks include, but not limited to, infection, bleeding, blood clot (thrombus formation), and puncture of an artery; nerve damage and irregular heat beat.  Alternatives to this procedure were also discussed.  PICC/Midline Placement Documentation  PICC Double Lumen [id number] PICC Right Basilic 42 cm 3 cm (Active)  Indication for Insertion or Continuance of Line Prolonged intravenous therapies 09/01/2015  5:57 PM  Exposed Catheter (cm) 3 cm 09/01/2015  5:57 PM  Site Assessment Clean;Dry;Intact 09/01/2015  5:57 PM  Lumen #1 Status Flushed;Saline locked;Blood return noted 09/01/2015  5:57 PM  Lumen #2 Status Flushed;Saline locked;Blood return noted 09/01/2015  5:57 PM  Dressing Type Transparent 09/01/2015  5:57 PM  Dressing Status Clean;Dry;Intact 09/01/2015  5:57 PM  Dressing Intervention New dressing 09/01/2015  5:57 PM  Dressing Change Due 09/08/15 09/01/2015  5:57 PM       [place]ordan Payment 09/01/2015, 5:58 PM

## 2015-09-01 NOTE — Transfer of Care (Signed)
Immediate Anesthesia Transfer of Care Note  Patient: [name] [name]  Procedure(s) Performed: Procedure(s): STERNAL WOUND DEBRIDEMENT (N/A)  Patient Location: PACU  Anesthesia Type:General  Level of Consciousness: awake, alert  and patient cooperative  Airway & Oxygen Therapy: Patient Spontanous Breathing and Patient connected to face mask oxygen  Post-op Assessment: Report given to RN, Post -op Vital signs reviewed and stable, Patient moving all extremities and Patient moving all extremities X 4  Post vital signs: Reviewed and stable  Last Vitals:  Filed Vitals:   09/01/15 0417 09/01/15 0500  BP:    Pulse:  100  Temp: 37.1 C   Resp:  25    Complications: No apparent anesthesia complications

## 2015-09-01 NOTE — Anesthesia Postprocedure Evaluation (Signed)
Anesthesia Post Note  Patient: [name] [name]  Procedure(s) Performed: Procedure(s) (LRB): STERNAL WOUND DEBRIDEMENT (N/A)  Patient location during evaluation: PACU Anesthesia Type: General Level of consciousness: awake, oriented and patient cooperative Pain management: pain level controlled Vital Signs Assessment: post-procedure vital signs reviewed and stable Respiratory status: spontaneous breathing and respiratory function stable Cardiovascular status: blood pressure returned to baseline and stable Anesthetic complications: no    Last Vitals:  Filed Vitals:   09/01/15 0500 09/01/15 0915  BP:    Pulse: 100 73  Temp:  36.7 C  Resp: 25 17    Last Pain:  Filed Vitals:   09/01/15 0930  PainSc: Asleep                 Carynn Felling EDWARD

## 2015-09-01 NOTE — Anesthesia Preprocedure Evaluation (Signed)
Anesthesia Evaluation  Patient identified by MRN, date of birth, ID band Patient awake    Reviewed: Allergy & Precautions, NPO status , Patient's Chart, lab work & pertinent test results  Airway Mallampati: I  TM Distance: >3 FB     Dental   Pulmonary    Pulmonary exam normal        Cardiovascular hypertension, + CAD, + Past MI and + CABG  Normal cardiovascular exam     Neuro/Psych    GI/Hepatic   Endo/Other    Renal/GU      Musculoskeletal   Abdominal   Peds  Hematology   Anesthesia Other Findings Sternal abscess  Reproductive/Obstetrics                             Anesthesia Physical Anesthesia Plan  ASA: III  Anesthesia Plan: General   Post-op Pain Management:    Induction: Intravenous  Airway Management Planned: Oral ETT  Additional Equipment: Arterial line  Intra-op Plan:   Post-operative Plan: Possible Post-op intubation/ventilation  Informed Consent: I have reviewed the patients History and Physical, chart, labs and discussed the procedure including the risks, benefits and alternatives for the proposed anesthesia with the patient or authorized representative who has indicated his/her understanding and acceptance.     Plan Discussed with: CRNA, Anesthesiologist and Surgeon  Anesthesia Plan Comments:         Anesthesia Quick Evaluation

## 2015-09-01 NOTE — Progress Notes (Signed)
[facility] BRIEF SICU PROGRESS NOTE  Day of Surgery  S/P Procedure(s) (LRB): STERNAL WOUND DEBRIDEMENT (N/A)   Resting comfortably in SICU Sinus tach w/ stable BP O2 sats 97% on 3 L/min via [place] Wound VAC intact UOP adequate  Plan: Continue current plan  Rexene Alberts, MD 09/01/2015 7:31 PM

## 2015-09-01 NOTE — Brief Op Note (Signed)
08/31/2015 - 09/01/2015  9:04 AM  PATIENT:  [name] [name]  75 y.o. female  PRE-OPERATIVE DIAGNOSIS: Superficial Sternal Wound Infection  POST-OPERATIVE DIAGNOSIS: Same  PROCEDURE:  Procedure(s): STERNAL WOUND DEBRIDEMENT (N/A) ( Excisional) with irrigation and wound VAC placement  SURGEON:  Surgeon(s) and Role:    * [name], MD - Primary  PHYSICIAN ASSISTANT: none ASSISTANTS: none   ANESTHESIA:   general  EBL:  Total I/O In: 2315 [I.V.:2000; Blood:315] Out: 300 [Urine:300]  BLOOD ADMINISTERED one unit PRBCs  DRAINS:Wound VAC  LOCAL MEDICATIONS USED:  NONE  SPECIMEN:  Aspirate  DISPOSITION OF SPECIMEN:  microbiology for cultures  COUNTS:  YES  TOURNIQUET:  * No tourniquets in log *  DICTATION: .Dragon Dictation  PLAN OF CARE: Admit to inpatient   PATIENT DISPOSITION:  PACU - hemodynamically stable.   Delay start of Pharmacological VTE agent (>24hrs) due to surgical blood loss or risk of bleeding: yes

## 2015-09-01 NOTE — Anesthesia Procedure Notes (Signed)
Procedure Name: Intubation Date/Time: 09/01/2015 7:42 AM Performed by: Izora Gala Pre-anesthesia Checklist: Patient identified, Emergency Drugs available, Suction available, Patient being monitored and Timeout performed Patient Re-evaluated:Patient Re-evaluated prior to inductionOxygen Delivery Method: Circle system utilized Preoxygenation: Pre-oxygenation with 100% oxygen Intubation Type: IV induction Ventilation: Mask ventilation without difficulty Laryngoscope Size: Mac and 3 Grade View: Grade II Tube type: Oral Tube size: 7.5 mm Number of attempts: 1 Airway Equipment and Method: Stylet Placement Confirmation: ETT inserted through vocal cords under direct vision,  breath sounds checked- equal and bilateral and positive ETCO2 Secured at: 23 cm Tube secured with: Tape Dental Injury: Teeth and Oropharynx as per pre-operative assessment

## 2015-09-02 ENCOUNTER — Encounter (HOSPITAL_COMMUNITY): Payer: Self-pay | Admitting: Cardiothoracic Surgery

## 2015-09-02 LAB — CBC
HCT: 28.3 % — ABNORMAL LOW (ref 36.0–46.0)
HCT: 29.1 % — ABNORMAL LOW (ref 36.0–46.0)
Hemoglobin: 8.8 g/dL — ABNORMAL LOW (ref 12.0–15.0)
Hemoglobin: 9.7 g/dL — ABNORMAL LOW (ref 12.0–15.0)
MCH: 26.8 pg (ref 26.0–34.0)
MCH: 28.7 pg (ref 26.0–34.0)
MCHC: 31.1 g/dL (ref 30.0–36.0)
MCHC: 33.3 g/dL (ref 30.0–36.0)
MCV: 86.3 fL (ref 78.0–100.0)
MCV: 86.1 fL (ref 78.0–100.0)
Platelets: 282 10*3/uL (ref 150–400)
Platelets: 242 10*3/uL (ref 150–400)
RBC: 3.28 MIL/uL — ABNORMAL LOW (ref 3.87–5.11)
RBC: 3.38 MIL/uL — ABNORMAL LOW (ref 3.87–5.11)
RDW: 15 % (ref 11.5–15.5)
RDW: 15 % (ref 11.5–15.5)
WBC: 7.6 10*3/uL (ref 4.0–10.5)
WBC: 8.6 10*3/uL (ref 4.0–10.5)

## 2015-09-02 LAB — BASIC METABOLIC PANEL
Anion gap: 9 (ref 5–15)
BUN: 15 mg/dL (ref 6–20)
CO2: 23 mmol/L (ref 22–32)
Calcium: 8 mg/dL — ABNORMAL LOW (ref 8.9–10.3)
Chloride: 101 mmol/L (ref 101–111)
Creatinine, Ser: 0.92 mg/dL (ref 0.44–1.00)
GFR calc Af Amer: >60 mL/min (ref >60)
GFR calc non Af Amer: 60 mL/min — ABNORMAL LOW (ref >60)
Glucose, Bld: 132 mg/dL — ABNORMAL HIGH (ref 65–99)
Potassium: 3.9 mmol/L (ref 3.5–5.1)
Sodium: 133 mmol/L — ABNORMAL LOW (ref 135–145)

## 2015-09-02 LAB — VANCOMYCIN, TROUGH: Vancomycin Tr: 17 ug/mL (ref 10.0–20.0)

## 2015-09-02 LAB — URINE CULTURE
Culture: NO GROWTH
Special Requests: NORMAL

## 2015-09-02 MED ORDER — FUROSEMIDE 10 MG/ML IJ SOLN
20.0000 mg | Freq: Every day | INTRAMUSCULAR | Status: DC
  Administered 2015-09-02: 20 mg via INTRAVENOUS
  Filled 2015-09-02: qty 2

## 2015-09-02 MED ORDER — POTASSIUM CHLORIDE 10 MEQ/50ML IV SOLN
10.0000 meq | INTRAVENOUS | Status: AC
  Administered 2015-09-02 (×2): 10 meq via INTRAVENOUS
  Filled 2015-09-02 (×2): qty 50

## 2015-09-02 NOTE — Progress Notes (Signed)
Pharmacy Antibiotic Note [name] [name] is a 75 y.o. female admitted on 08/31/2015 with superficial sternal wound infection in setting of recent CABG. Currently on day 3 of Zosyn and vancomycin.  Plan: 1. Vancomycin trough of ~ 17 is within desired range of 15-20; continue with current dosing of 1000 mg Q12H 2. Weekly trough unless clinical course dictates otherwise 3. SCr Q 72H 4. Following along with you daily    Height: 5\' 10"  (177.8 cm) Weight: 188 lb 7.9 oz (85.5 kg) IBW/kg (Calculated) : 68.5  Temp (24hrs), Avg:97.9 F (36.6 C), Min:97.4 F (36.3 C), Max:98.2 F (36.8 C)   Recent Labs Lab 08/31/15 1544 08/31/15 1558 09/01/15 0415 09/01/15 1536 09/02/15 0420 09/02/15 1040 09/02/15 1810  WBC 11.2*  --  9.0 8.6 7.6 8.6  --   CREATININE 1.09*  --  0.86 0.78 0.92  --   --   LATICACIDVEN  --  1.40  --   --   --   --   --   VANCOTROUGH  --   --   --   --   --   --  17   Antimicrobials this admission: Vanc 2/5>>  Zosyn 2/6>>  Dose adjustments this admission: 2/7: VT 17 (drawn~ 2 hours early on Vancomycin 1 gram Q12H and SCr 0.9)  Microbiology results: 2/6 UCx: ngF  2/6 Sternum wound: ngtd  2/5 wound cx>>  2/5 BC x 2>>  Thank you for allowing pharmacy to be a part of this patient's care.  Vincenza Hews, PharmD, BCPS 09/02/2015, 7:11 PM Pager: [phone]

## 2015-09-02 NOTE — Op Note (Signed)
NAME:  DONN, MASTIN NO.:  [id number]  MEDICAL RECORD NO.:  [id number]  LOCATION:  2S06C                        FACILITY:  [facility]  PHYSICIAN:  [name], M.D.  DATE OF BIRTH:  03-20-1941  DATE OF PROCEDURE:  09/01/2015 DATE OF DISCHARGE:                              OPERATIVE REPORT   OPERATION: 1. Debridement and irrigation of superficial sternal wound infection     (excisional debridement). 2. Placement of wound VAC, wound system.  SURGEON:  [name], M.D.  PREOPERATIVE DIAGNOSIS:  Superficial sternal wound infection status post multivessel coronary artery bypass graft.  POSTOPERATIVE DIAGNOSIS:  Superficial sternal wound infection status post multivessel coronary artery bypass graft.  ANESTHESIA:  General.  INDICATIONS:  The patient is a 75 year old obese, African American female, who had previously undergone coronary artery bypass grafting after a non-ST-elevation MI in mid January 2017.  The patient had no wound healing problems postop and was without fever.  The patient does now disclose that prior to her presentation with non-ST-elevation MI, she had infected neck carbuncles, which had drained spontaneously approximately 3 weeks previously.  For the past few days, she has had some soreness of her upper sternum and the family noticed some swelling.  Yesterday, she had some purulent drainage begin spontaneously and she was admitted to the hospital through the emergency department and placed on IV antibiotics.  Wound cultures were taken, which are no growth so far.  A CT scan of the chest showed evidence of an upper sternal wound infection with some potential collection just beneath the upper portion of the sternum.  She was not toxic.  She was prepared for sternal wound debridement and wound VAC placement today.  I discussed the procedure of wound debridement and wound VAC placement with the patient including the indications,  benefits, alternatives, and risks of bleeding, recurrent infection, and pain.  She understood and agreed to proceed with surgery.  OPERATIVE PROCEDURE:  The patient was placed supine on the operating table, general anesthesia was induced.  A-line was placed and 2 peripheral IVs.  She was prepped and draped as a sterile field.  A proper time-out was performed.  An incision was made over the upper portion of the sternum, which had been previously opened upon admission. It was clear that the infected process had undermined the skin down to approximately the mid sternal incision.  The upper 2 sternal wires were intact, but the sternum had separated and there was some unhealthy granulation tissue between the sternal edges. I removed those 2 sternal wires.  I used the scalpel blade and the curette to clean the sternal edges.  The wound was then irrigated with the pulse lavage and further excisional debridement of fat necrosis of the chest wall was performed.  Hemostasis was obtained.  The wound VAC was then trimmed to the appropriate size and configuration.  The white sponge was used in between the sternal edges at the very top of the incision and then a large black sponge for the soft tissue defect.  The wound VAC sponges were covered with the skin drape, the suction disc was applied, and the wound VAC was compressed -  125.  She was reversed from anesthesia, returned to recovery room in stable condition.     [name], M.D.     PV/MEDQ  D:  09/01/2015  T:  09/02/2015  Job:  [id number]

## 2015-09-02 NOTE — Progress Notes (Signed)
CT surgery p.m. Rounds  Patient examined and record reviewed.Hemodynamics stable,labs satisfactory.Patient had stable day.Continue current care. [name] [name] 09/02/2015

## 2015-09-02 NOTE — Progress Notes (Signed)
1 Day Post-Op Procedure(s) (LRB): STERNAL WOUND DEBRIDEMENT (N/A) Subjective: Patient feels better after sternal debridement for superficial sternal wound infection and wound VAC placement  Wound cultures remain negative and we'll continue empiric IV vancomycin and Zosyn. Chest x-ray shows no significant pleural effusion. Moderate thin serosanguineous drainage from wound VAC drain Patient to mobilize out of bed to chair today PICC line placed for long-term IV antibiotics  Objective: Vital signs in last 24 hours: Temp:  [97.4 F (36.3 C)-98.2 F (36.8 C)] 98 F (36.7 C) (02/07 1935) Pulse Rate:  [70-110] 85 (02/07 1700) Cardiac Rhythm:  [-] Normal sinus rhythm (02/07 1600) Resp:  [15-30] 18 (02/07 1700) BP: (78-107)/(46-65) 92/62 mmHg (02/07 1700) SpO2:  [95 %-100 %] 99 % (02/07 1700) Weight:  [188 lb 7.9 oz (85.5 kg)] 188 lb 7.9 oz (85.5 kg) (02/07 0600)  Hemodynamic parameters for last 24 hours:    Intake/Output from previous day: 02/06 0701 - 02/07 0700 In: 3925 [P.O.:480; I.V.:2780; Blood:315; IV Piggyback:350] Out: [id number] [Urine:980; Drains:325; Blood:300] Intake/Output this shift:         Exam    General- alert and comfortable   Lungs- clear without rales, wheezes   Cor- regular rate and rhythm, no murmur , gallop. Wound VAC compressed over sternal incision   Abdomen- soft, non-tender   Extremities - warm, non-tender, minimal edema   Neuro- oriented, appropriate, no focal weakness   Lab Results:  Recent Labs  09/02/15 0420 09/02/15 1040  WBC 7.6 8.6  HGB 8.8* 9.7*  HCT 28.3* 29.1*  PLT 282 242   BMET:  Recent Labs  09/01/15 1536 09/02/15 0420  NA 129* 133*  K 4.2 3.9  CL 99* 101  CO2 21* 23  GLUCOSE 159* 132*  BUN 14 15  CREATININE 0.78 0.92  CALCIUM 7.8* 8.0*    PT/INR: No results for input(s): LABPROT, INR in the last 72 hours. ABG    Component Value Date/Time   PHART 7.458* 08/15/2015 0943   HCO3 26.2* 08/15/2015 0943   TCO2 24  08/15/2015 1651   O2SAT 65.0 08/17/2015 0350   CBG (last 3)  No results for input(s): GLUCAP in the last 72 hours.  Assessment/Plan: S/P Procedure(s) (LRB): STERNAL WOUND DEBRIDEMENT (N/A) Mobilize Diuresis Diabetes control Keep in ICU because of wound VAC between upper sternal edges   LOS: 2 days    [name] [name] 09/02/2015

## 2015-09-02 NOTE — Care Management Note (Signed)
Case Management Note  Patient Details  Name: [name] [name] MRN: [id number] Date of Birth: 28-Jul-1940  Subjective/Objective:     Pt lives with dtr, has rolling walker and 3-N-1, Garibaldi is active with PT, pt will now need RN as well.                         Expected Discharge Plan:  [place]  Discharge planning Services  CM Consult  Post Acute Care Choice:  Resumption of Svcs/PTA Provider  HH Arranged:  RN, PT [facility] Agency:  [place]  Status of Service:  In process, will continue to follow  Girard Cooter, RN 09/02/2015, 10:30 AM

## 2015-09-03 ENCOUNTER — Inpatient Hospital Stay (HOSPITAL_COMMUNITY): Payer: Medicare PPO

## 2015-09-03 LAB — WOUND CULTURE
Culture: NO GROWTH
Culture: NO GROWTH

## 2015-09-03 LAB — COMPREHENSIVE METABOLIC PANEL
ALT: 19 U/L (ref 14–54)
AST: 19 U/L (ref 15–41)
Albumin: 1.6 g/dL — ABNORMAL LOW (ref 3.5–5.0)
Alkaline Phosphatase: 110 U/L (ref 38–126)
Anion gap: 7 (ref 5–15)
BUN: 12 mg/dL (ref 6–20)
CO2: 27 mmol/L (ref 22–32)
Calcium: 8.3 mg/dL — ABNORMAL LOW (ref 8.9–10.3)
Chloride: 103 mmol/L (ref 101–111)
Creatinine, Ser: 0.95 mg/dL (ref 0.44–1.00)
GFR calc Af Amer: >60 mL/min (ref >60)
GFR calc non Af Amer: 58 mL/min — ABNORMAL LOW (ref >60)
Glucose, Bld: 118 mg/dL — ABNORMAL HIGH (ref 65–99)
Potassium: 3.7 mmol/L (ref 3.5–5.1)
Sodium: 137 mmol/L (ref 135–145)
Total Bilirubin: 0.6 mg/dL (ref 0.3–1.2)
Total Protein: 4.9 g/dL — ABNORMAL LOW (ref 6.5–8.1)

## 2015-09-03 LAB — CBC
HCT: 27.9 % — ABNORMAL LOW (ref 36.0–46.0)
Hemoglobin: 8.7 g/dL — ABNORMAL LOW (ref 12.0–15.0)
MCH: 26.9 pg (ref 26.0–34.0)
MCHC: 31.2 g/dL (ref 30.0–36.0)
MCV: 86.4 fL (ref 78.0–100.0)
Platelets: 250 10*3/uL (ref 150–400)
RBC: 3.23 MIL/uL — ABNORMAL LOW (ref 3.87–5.11)
RDW: 14.8 % (ref 11.5–15.5)
WBC: 7.4 10*3/uL (ref 4.0–10.5)

## 2015-09-03 MED ORDER — FUROSEMIDE 10 MG/ML IJ SOLN
20.0000 mg | Freq: Two times a day (BID) | INTRAMUSCULAR | Status: DC
  Administered 2015-09-03 – 2015-09-04 (×2): 20 mg via INTRAVENOUS
  Filled 2015-09-03 (×2): qty 2

## 2015-09-03 MED ORDER — ENOXAPARIN SODIUM 40 MG/0.4ML ~~LOC~~ SOLN
40.0000 mg | SUBCUTANEOUS | Status: DC
  Administered 2015-09-03: 40 mg via SUBCUTANEOUS
  Filled 2015-09-03: qty 0.4

## 2015-09-03 MED ORDER — ENSURE ENLIVE PO LIQD
237.0000 mL | Freq: Three times a day (TID) | ORAL | Status: DC
  Administered 2015-09-03 – 2015-09-14 (×15): 237 mL via ORAL

## 2015-09-03 MED ORDER — ALBUMIN HUMAN 25 % IV SOLN
12.5000 g | Freq: Four times a day (QID) | INTRAVENOUS | Status: AC
  Administered 2015-09-03 (×3): 12.5 g via INTRAVENOUS
  Filled 2015-09-03 (×3): qty 50

## 2015-09-03 MED ORDER — POTASSIUM CHLORIDE 10 MEQ/50ML IV SOLN
10.0000 meq | INTRAVENOUS | Status: AC
  Administered 2015-09-03 (×3): 10 meq via INTRAVENOUS
  Filled 2015-09-03 (×3): qty 50

## 2015-09-03 NOTE — Progress Notes (Signed)
Utilization Review Completed.  

## 2015-09-03 NOTE — Progress Notes (Signed)
2 Days Post-Op Procedure(s) (LRB): STERNAL WOUND DEBRIDEMENT (N/A) Subjective: Afebrile Wound cultures negative [place] VAC drainage  Objective: Vital signs in last 24 hours: Temp:  [97.7 F (36.5 C)-98.2 F (36.8 C)] 97.7 F (36.5 C) (02/08 0800) Pulse Rate:  [81-95] 84 (02/08 0600) Cardiac Rhythm:  [-] Normal sinus rhythm (02/08 0600) Resp:  [13-22] 22 (02/08 0800) BP: (78-104)/(46-65) 104/58 mmHg (02/08 0600) SpO2:  [95 %-100 %] 100 % (02/08 0800)  Hemodynamic parameters for last 24 hours:    Intake/Output from previous day: 02/07 0701 - 02/08 0700 In: 804.5 [I.V.:192; IV Piggyback:612.5] Out: 1400 [Urine:1350; Drains:50] Intake/Output this shift: Total I/O In: 10 [I.V.:10] Out: 110 [Urine:60; Drains:50]       Exam    General- alert and comfortable   Lungs- clear without rales, wheezes Wound VAC compressed to conform to wound   Cor- regular rate and rhythm, no murmur , gallop   Abdomen- soft, non-tender   Extremities - warm, non-tender, minimal edema   Neuro- oriented, appropriate, no focal weakness   Lab Results:  Recent Labs  09/02/15 1040 09/03/15 0526  WBC 8.6 7.4  HGB 9.7* 8.7*  HCT 29.1* 27.9*  PLT 242 250   BMET:  Recent Labs  09/02/15 0420 09/03/15 0526  NA 133* 137  K 3.9 3.7  CL 101 103  CO2 23 27  GLUCOSE 132* 118*  BUN 15 12  CREATININE 0.92 0.95  CALCIUM 8.0* 8.3*    PT/INR: No results for input(s): LABPROT, INR in the last 72 hours. ABG    Component Value Date/Time   PHART 7.458* 08/15/2015 0943   HCO3 26.2* 08/15/2015 0943   TCO2 24 08/15/2015 1651   O2SAT 65.0 08/17/2015 0350   CBG (last 3)  No results for input(s): GLUCAP in the last 72 hours.  Assessment/Plan: S/P Procedure(s) (LRB): STERNAL WOUND DEBRIDEMENT (N/A) VAC change in am Orders placed   LOS: 3 days    [name] III 09/03/2015

## 2015-09-03 NOTE — Progress Notes (Signed)
      [address]       [place] [address]             [phone]      No complaints  Afebrile  BP 129/69 mmHg  Pulse 100  Temp(Src) 97.9 F (36.6 C) (Oral)  Resp 24  Ht 5\' 10"  (1.778 m)  Wt 184 lb 11.9 oz (83.8 kg)  BMI 26.51 kg/m2  SpO2 96%  For VAC change tomorrow  Remo Lipps C. Roxan Hockey, MD [facility] [phone]

## 2015-09-04 ENCOUNTER — Inpatient Hospital Stay (HOSPITAL_COMMUNITY): Payer: Medicare PPO

## 2015-09-04 ENCOUNTER — Encounter (HOSPITAL_COMMUNITY)
Admission: EM | Disposition: A | Discharge status: Home Health | Payer: Self-pay | Source: Home / Self Care | Attending: Cardiothoracic Surgery

## 2015-09-04 ENCOUNTER — Other Ambulatory Visit: Payer: Self-pay | Admitting: Plastic Surgery

## 2015-09-04 ENCOUNTER — Inpatient Hospital Stay (HOSPITAL_COMMUNITY): Payer: Medicare PPO | Admitting: Certified Registered Nurse Anesthetist

## 2015-09-04 DIAGNOSIS — T8132XA Disruption of internal operation (surgical) wound, not elsewhere classified, initial encounter: Secondary | ICD-10-CM

## 2015-09-04 HISTORY — PX: APPLICATION OF WOUND VAC: SHX5189

## 2015-09-04 HISTORY — PX: STERNAL WOUND DEBRIDEMENT: SHX1058

## 2015-09-04 LAB — TYPE AND SCREEN
ABO/RH(D): O POS
Antibody Screen: NEGATIVE
Unit division: 0
Unit division: 0

## 2015-09-04 LAB — CBC
HCT: 28.2 % — ABNORMAL LOW (ref 36.0–46.0)
HCT: 32.2 % — ABNORMAL LOW (ref 36.0–46.0)
Hemoglobin: 9.2 g/dL — ABNORMAL LOW (ref 12.0–15.0)
Hemoglobin: 10.1 g/dL — ABNORMAL LOW (ref 12.0–15.0)
MCH: 28.3 pg (ref 26.0–34.0)
MCH: 27.2 pg (ref 26.0–34.0)
MCHC: 32.6 g/dL (ref 30.0–36.0)
MCHC: 31.4 g/dL (ref 30.0–36.0)
MCV: 86.8 fL (ref 78.0–100.0)
MCV: 86.8 fL (ref 78.0–100.0)
Platelets: 256 10*3/uL (ref 150–400)
Platelets: 250 10*3/uL (ref 150–400)
RBC: 3.25 MIL/uL — ABNORMAL LOW (ref 3.87–5.11)
RBC: 3.71 MIL/uL — ABNORMAL LOW (ref 3.87–5.11)
RDW: 14.6 % (ref 11.5–15.5)
RDW: 14.1 % (ref 11.5–15.5)
WBC: 7.6 10*3/uL (ref 4.0–10.5)
WBC: 8.7 10*3/uL (ref 4.0–10.5)

## 2015-09-04 LAB — COMPREHENSIVE METABOLIC PANEL
ALT: 19 U/L (ref 14–54)
AST: 22 U/L (ref 15–41)
Albumin: 2.1 g/dL — ABNORMAL LOW (ref 3.5–5.0)
Alkaline Phosphatase: 114 U/L (ref 38–126)
Anion gap: 10 (ref 5–15)
BUN: 11 mg/dL (ref 6–20)
CO2: 26 mmol/L (ref 22–32)
Calcium: 8.6 mg/dL — ABNORMAL LOW (ref 8.9–10.3)
Chloride: 101 mmol/L (ref 101–111)
Creatinine, Ser: 0.85 mg/dL (ref 0.44–1.00)
GFR calc Af Amer: >60 mL/min (ref >60)
GFR calc non Af Amer: >60 mL/min (ref >60)
Glucose, Bld: 127 mg/dL — ABNORMAL HIGH (ref 65–99)
Potassium: 3.8 mmol/L (ref 3.5–5.1)
Sodium: 137 mmol/L (ref 135–145)
Total Bilirubin: 0.7 mg/dL (ref 0.3–1.2)
Total Protein: 5.8 g/dL — ABNORMAL LOW (ref 6.5–8.1)

## 2015-09-04 LAB — PREPARE RBC (CROSSMATCH)

## 2015-09-04 LAB — MAGNESIUM: Magnesium: 1.6 mg/dL — ABNORMAL LOW (ref 1.7–2.4)

## 2015-09-04 SURGERY — DEBRIDEMENT, WOUND, STERNUM
Anesthesia: General | Site: Chest

## 2015-09-04 MED ORDER — PROPOFOL 10 MG/ML IV BOLUS
INTRAVENOUS | Status: AC
  Filled 2015-09-04: qty 20

## 2015-09-04 MED ORDER — PROPOFOL 10 MG/ML IV BOLUS
INTRAVENOUS | Status: DC | PRN
  Administered 2015-09-04: 120 mg via INTRAVENOUS

## 2015-09-04 MED ORDER — FENTANYL CITRATE (PF) 250 MCG/5ML IJ SOLN
INTRAMUSCULAR | Status: AC
  Filled 2015-09-04: qty 5

## 2015-09-04 MED ORDER — PHENYLEPHRINE HCL 10 MG/ML IJ SOLN
10.0000 mg | INTRAVENOUS | Status: DC | PRN
  Administered 2015-09-04: 40 ug/min via INTRAVENOUS

## 2015-09-04 MED ORDER — SODIUM CHLORIDE 0.9 % IR SOLN
Status: DC | PRN
Start: 2015-09-04 — End: 2015-09-04
  Administered 2015-09-04: 1000 mL

## 2015-09-04 MED ORDER — FENTANYL CITRATE (PF) 100 MCG/2ML IJ SOLN
25.0000 ug | INTRAMUSCULAR | Status: DC | PRN
  Administered 2015-09-04: 25 ug via INTRAVENOUS

## 2015-09-04 MED ORDER — MIDAZOLAM HCL 2 MG/2ML IJ SOLN
INTRAMUSCULAR | Status: AC
  Filled 2015-09-04: qty 2

## 2015-09-04 MED ORDER — VANCOMYCIN HCL 1000 MG IV SOLR
INTRAVENOUS | Status: DC | PRN
  Administered 2015-09-04: 1000 mg

## 2015-09-04 MED ORDER — SODIUM CHLORIDE 0.9 % IJ SOLN
INTRAMUSCULAR | Status: AC
  Filled 2015-09-04: qty 10

## 2015-09-04 MED ORDER — ROCURONIUM BROMIDE 50 MG/5ML IV SOLN
INTRAVENOUS | Status: AC
  Filled 2015-09-04: qty 1

## 2015-09-04 MED ORDER — ENOXAPARIN SODIUM 40 MG/0.4ML ~~LOC~~ SOLN
40.0000 mg | SUBCUTANEOUS | Status: DC
  Administered 2015-09-05 – 2015-09-07 (×3): 40 mg via SUBCUTANEOUS
  Filled 2015-09-04 (×3): qty 0.4

## 2015-09-04 MED ORDER — ONDANSETRON HCL 4 MG/2ML IJ SOLN
INTRAMUSCULAR | Status: AC
  Filled 2015-09-04: qty 2

## 2015-09-04 MED ORDER — CEFAZOLIN SODIUM-DEXTROSE 2-3 GM-% IV SOLR
INTRAVENOUS | Status: DC | PRN
  Administered 2015-09-04: 2 g via INTRAVENOUS

## 2015-09-04 MED ORDER — SUCCINYLCHOLINE CHLORIDE 20 MG/ML IJ SOLN
INTRAMUSCULAR | Status: DC | PRN
  Administered 2015-09-04: 100 mg via INTRAVENOUS

## 2015-09-04 MED ORDER — FENTANYL CITRATE (PF) 100 MCG/2ML IJ SOLN
INTRAMUSCULAR | Status: AC
  Filled 2015-09-04: qty 2

## 2015-09-04 MED ORDER — FENTANYL CITRATE (PF) 100 MCG/2ML IJ SOLN
50.0000 ug | INTRAMUSCULAR | Status: DC | PRN
  Administered 2015-09-04 – 2015-09-08 (×2): 50 ug via INTRAVENOUS
  Filled 2015-09-04 (×2): qty 2

## 2015-09-04 MED ORDER — EPHEDRINE SULFATE 50 MG/ML IJ SOLN
INTRAMUSCULAR | Status: AC
  Filled 2015-09-04: qty 1

## 2015-09-04 MED ORDER — SODIUM CHLORIDE 0.9 % IV SOLN
INTRAVENOUS | Status: DC | PRN
  Administered 2015-09-04: 11:00:00 via INTRAVENOUS

## 2015-09-04 MED ORDER — SODIUM CHLORIDE 0.9 % IV SOLN
Freq: Once | INTRAVENOUS | Status: AC
  Administered 2015-09-05: 06:00:00 via INTRAVENOUS

## 2015-09-04 MED ORDER — SUGAMMADEX SODIUM 200 MG/2ML IV SOLN
INTRAVENOUS | Status: AC
  Filled 2015-09-04: qty 2

## 2015-09-04 MED ORDER — VANCOMYCIN HCL 1000 MG IV SOLR
INTRAVENOUS | Status: AC
  Filled 2015-09-04: qty 1000

## 2015-09-04 MED ORDER — MEPERIDINE HCL 25 MG/ML IJ SOLN: 6.2500 mg | INTRAMUSCULAR | Status: DC | PRN

## 2015-09-04 MED ORDER — PROMETHAZINE HCL 25 MG/ML IJ SOLN: 6.2500 mg | INTRAMUSCULAR | Status: DC | PRN

## 2015-09-04 MED ORDER — FUROSEMIDE 10 MG/ML IJ SOLN
20.0000 mg | Freq: Every day | INTRAMUSCULAR | Status: DC
  Administered 2015-09-05: 20 mg via INTRAVENOUS
  Filled 2015-09-04: qty 2

## 2015-09-04 SURGICAL SUPPLY — 69 items
ATTRACTOMAT 16X20 MAGNETIC DRP (DRAPES) ×3 IMPLANT
BAG DECANTER FOR FLEXI CONT (MISCELLANEOUS) ×3 IMPLANT
BENZOIN TINCTURE PRP APPL 2/3 (GAUZE/BANDAGES/DRESSINGS) IMPLANT
BLADE SURG 10 STRL SS (BLADE) ×3 IMPLANT
BLADE SURG 15 STRL LF DISP TIS (BLADE) IMPLANT
BLADE SURG 15 STRL SS (BLADE)
BNDG GAUZE ELAST 4 BULKY (GAUZE/BANDAGES/DRESSINGS) IMPLANT
CANISTER SUCTION 2500CC (MISCELLANEOUS) ×3 IMPLANT
CATH FOLEY 2WAY SLVR  5CC 16FR (CATHETERS)
CATH FOLEY 2WAY SLVR 5CC 16FR (CATHETERS) IMPLANT
CATH THORACIC 28FR RT ANG (CATHETERS) IMPLANT
CATH THORACIC 36FR (CATHETERS) IMPLANT
CATH THORACIC 36FR RT ANG (CATHETERS) IMPLANT
CLIP TI WIDE RED SMALL 24 (CLIP) IMPLANT
CONN Y 3/8X3/8X3/8  BEN (MISCELLANEOUS)
CONN Y 3/8X3/8X3/8 BEN (MISCELLANEOUS) IMPLANT
CONT SPEC 4OZ CLIKSEAL STRL BL (MISCELLANEOUS) IMPLANT
COVER SURGICAL LIGHT HANDLE (MISCELLANEOUS) ×6 IMPLANT
DRAPE LAPAROSCOPIC ABDOMINAL (DRAPES) ×3 IMPLANT
DRAPE SLUSH/WARMER DISC (DRAPES) IMPLANT
DRAPE WARM FLUID 44X44 (DRAPE) IMPLANT
DRSG AQUACEL AG ADV 3.5X14 (GAUZE/BANDAGES/DRESSINGS) ×3 IMPLANT
DRSG PAD ABDOMINAL 8X10 ST (GAUZE/BANDAGES/DRESSINGS) IMPLANT
DRSG VAC ATS MED SENSATRAC (GAUZE/BANDAGES/DRESSINGS) ×3 IMPLANT
ELECT BLADE 6.5 EXT (BLADE) ×3 IMPLANT
ELECT REM PT RETURN 9FT ADLT (ELECTROSURGICAL) ×3
ELECTRODE REM PT RTRN 9FT ADLT (ELECTROSURGICAL) ×1 IMPLANT
GAUZE SPONGE 4X4 12PLY STRL (GAUZE/BANDAGES/DRESSINGS) ×3 IMPLANT
GAUZE XEROFORM 5X9 LF (GAUZE/BANDAGES/DRESSINGS) IMPLANT
GLOVE BIO SURGEON STRL SZ 6.5 (GLOVE) ×6 IMPLANT
GLOVE BIO SURGEON STRL SZ7.5 (GLOVE) ×6 IMPLANT
GLOVE BIO SURGEONS STRL SZ 6.5 (GLOVE) ×3
GOWN STRL REUS W/ TWL LRG LVL3 (GOWN DISPOSABLE) ×4 IMPLANT
GOWN STRL REUS W/TWL LRG LVL3 (GOWN DISPOSABLE) ×8
HANDPIECE INTERPULSE COAX TIP (DISPOSABLE) ×4
HEMOSTAT POWDER SURGIFOAM 1G (HEMOSTASIS) IMPLANT
HEMOSTAT SURGICEL 2X14 (HEMOSTASIS) IMPLANT
KIT BASIN OR (CUSTOM PROCEDURE TRAY) ×3 IMPLANT
KIT ROOM TURNOVER OR (KITS) ×3 IMPLANT
KIT SUCTION CATH 14FR (SUCTIONS) IMPLANT
MICROMATRIX 1000MG (Tissue) ×3 IMPLANT
MICROMATRIX 500MG (Tissue) ×3 IMPLANT
NS IRRIG 1000ML POUR BTL (IV SOLUTION) ×3 IMPLANT
PACK CHEST (CUSTOM PROCEDURE TRAY) ×3 IMPLANT
PAD ARMBOARD 7.5X6 YLW CONV (MISCELLANEOUS) ×6 IMPLANT
PAD NEG PRESSURE SENSATRAC (MISCELLANEOUS) ×3 IMPLANT
SET HNDPC FAN SPRY TIP SCT (DISPOSABLE) ×2 IMPLANT
SOLUTION BETADINE 4OZ (MISCELLANEOUS) IMPLANT
SOLUTION PARTIC MCRMTRX 1000MG (Tissue) ×1 IMPLANT
SOLUTION PARTIC MCRMTRX 500MG (Tissue) ×1 IMPLANT
SPONGE LAP 18X18 X RAY DECT (DISPOSABLE) ×6 IMPLANT
STAPLER VISISTAT 35W (STAPLE) IMPLANT
STRAP MONTGOMERY 1.25X11-1/8 (MISCELLANEOUS) IMPLANT
SUT ETHILON 3 0 FSL (SUTURE) IMPLANT
SUT MON AB 2-0 CT1 36 (SUTURE) ×6 IMPLANT
SUT STEEL 6MS V (SUTURE) IMPLANT
SUT STEEL STERNAL CCS#1 18IN (SUTURE) IMPLANT
SUT STEEL SZ 6 DBL 3X14 BALL (SUTURE) IMPLANT
SUT VIC AB 1 CTX 36 (SUTURE) ×4
SUT VIC AB 1 CTX36XBRD ANBCTR (SUTURE) ×2 IMPLANT
SUT VIC AB 2-0 CTX 27 (SUTURE) ×6 IMPLANT
SUT VIC AB 3-0 X1 27 (SUTURE) ×6 IMPLANT
SWAB COLLECTION DEVICE MRSA (MISCELLANEOUS) IMPLANT
SYR 5ML LL (SYRINGE) IMPLANT
TOWEL OR 17X24 6PK STRL BLUE (TOWEL DISPOSABLE) ×3 IMPLANT
TOWEL OR 17X26 10 PK STRL BLUE (TOWEL DISPOSABLE) ×3 IMPLANT
TRAY FOLEY CATH 16FRSI W/METER (SET/KITS/TRAYS/PACK) IMPLANT
TUBE ANAEROBIC SPECIMEN COL (MISCELLANEOUS) IMPLANT
WATER STERILE IRR 1000ML POUR (IV SOLUTION) ×3 IMPLANT

## 2015-09-04 NOTE — Anesthesia Postprocedure Evaluation (Signed)
Anesthesia Post Note  Patient: [name] [name]  Procedure(s) Performed: Procedure(s) (LRB): STERNAL WOUND DEBRIDEMENT (N/A) WOUND VAC CHANGE (N/A)  Patient location during evaluation: PACU Anesthesia Type: General Level of consciousness: awake and alert Pain management: pain level controlled Vital Signs Assessment: post-procedure vital signs reviewed and stable Respiratory status: spontaneous breathing, nonlabored ventilation, respiratory function stable and patient connected to nasal cannula oxygen Cardiovascular status: blood pressure returned to baseline and stable Postop Assessment: no signs of nausea or vomiting Anesthetic complications: no    Last Vitals:  Filed Vitals:   09/04/15 1241 09/04/15 1256  BP: 123/65 133/68  Pulse:    Temp:    Resp: 14 17    Last Pain:  Filed Vitals:   09/04/15 1311  PainSc: 0-No pain                 Montez Hageman

## 2015-09-04 NOTE — Anesthesia Procedure Notes (Signed)
Procedure Name: Intubation Date/Time: 09/04/2015 11:23 AM Performed by: Lance Coon Pre-anesthesia Checklist: Patient identified, Emergency Drugs available, Suction available, Patient being monitored and Timeout performed Patient Re-evaluated:Patient Re-evaluated prior to inductionOxygen Delivery Method: Circle system utilized Preoxygenation: Pre-oxygenation with 100% oxygen Intubation Type: IV induction Ventilation: Mask ventilation without difficulty Laryngoscope Size: Miller and 3 Grade View: Grade II Tube type: Oral Tube size: 7.5 mm Number of attempts: 1 Airway Equipment and Method: Stylet Placement Confirmation: ETT inserted through vocal cords under direct vision,  positive ETCO2 and breath sounds checked- equal and bilateral Secured at: 20 cm Tube secured with: Tape Dental Injury: Teeth and Oropharynx as per pre-operative assessment

## 2015-09-04 NOTE — Anesthesia Preprocedure Evaluation (Signed)
Anesthesia Evaluation  Patient identified by MRN, date of birth, ID band Patient awake    Reviewed: Allergy & Precautions, NPO status , Patient's Chart, lab work & pertinent test results  Airway Mallampati: I  TM Distance: >3 FB Neck ROM: Full    Dental no notable dental hx.    Pulmonary neg pulmonary ROS,    Pulmonary exam normal breath sounds clear to auscultation       Cardiovascular hypertension, Pt. on medications + CAD, + Past MI and + CABG  Normal cardiovascular exam Rhythm:Regular Rate:Normal     Neuro/Psych negative neurological ROS  negative psych ROS   GI/Hepatic negative GI ROS, Neg liver ROS,   Endo/Other  negative endocrine ROS  Renal/GU negative Renal ROS  negative genitourinary   Musculoskeletal negative musculoskeletal ROS (+)   Abdominal   Peds negative pediatric ROS (+)  Hematology  (+) anemia ,   Anesthesia Other Findings Sternal abscess  Reproductive/Obstetrics negative OB ROS                             Anesthesia Physical  Anesthesia Plan  ASA: III  Anesthesia Plan: General   Post-op Pain Management:    Induction: Intravenous  Airway Management Planned: Oral ETT  Additional Equipment:   Intra-op Plan:   Post-operative Plan: Extubation in OR  Informed Consent: I have reviewed the patients History and Physical, chart, labs and discussed the procedure including the risks, benefits and alternatives for the proposed anesthesia with the patient or authorized representative who has indicated his/her understanding and acceptance.   Dental advisory given  Plan Discussed with: CRNA, Anesthesiologist and Surgeon  Anesthesia Plan Comments:         Anesthesia Quick Evaluation

## 2015-09-04 NOTE — Care Management Important Message (Signed)
Important Message  Patient Details  Name: [name] [name] MRN: [id number] Date of Birth: [birth year]   Medicare Important Message Given:  Yes    Loann Quill 09/04/2015, 8:06 AM

## 2015-09-04 NOTE — Progress Notes (Signed)
3 Days Post-Op Procedure(s) (LRB): STERNAL WOUND DEBRIDEMENT (N/A) Subjective: Minimal chest discomfort, VAC drainage nsr  for VAC change today in OR  Objective: Vital signs in last 24 hours: Temp:  [97.8 F (36.6 C)-98.4 F (36.9 C)] 98.2 F (36.8 C) (02/09 0800) Pulse Rate:  [79-105] 90 (02/09 0800) Cardiac Rhythm:  [-] Normal sinus rhythm (02/09 0800) Resp:  [14-25] 17 (02/09 0800) BP: (93-135)/(52-73) 135/66 mmHg (02/09 0800) SpO2:  [91 %-100 %] 93 % (02/09 0800) Weight:  [184 lb 4.9 oz (83.6 kg)] 184 lb 4.9 oz (83.6 kg) (02/09 0500)  Hemodynamic parameters for last 24 hours:  afebrile  Intake/Output from previous day: 02/08 0701 - 02/09 0700 In: 1769 [P.O.:780; I.V.:251.5; IV Piggyback:737.5] Out: 1640 [Urine:1565; Drains:75] Intake/Output this shift: Total I/O In: 210 [I.V.:10; IV Piggyback:200] Out: 30 [Urine:30]       Exam    General- alert and comfortable   Lungs- clear without rales, wheezes   Cor- regular rate and rhythm, no murmur , gallop   Abdomen- soft, non-tender   Extremities - warm, non-tender, minimal edema   Neuro- oriented, appropriate, no focal weakness   Lab Results:  Recent Labs  09/03/15 0526 09/04/15 0540  WBC 7.4 7.6  HGB 8.7* 9.2*  HCT 27.9* 28.2*  PLT 250 256   BMET:  Recent Labs  09/03/15 0526 09/04/15 0540  NA 137 137  K 3.7 3.8  CL 103 101  CO2 27 26  GLUCOSE 118* 127*  BUN 12 11  CREATININE 0.95 0.85  CALCIUM 8.3* 8.6*    PT/INR: No results for input(s): LABPROT, INR in the last 72 hours. ABG    Component Value Date/Time   PHART 7.458* 08/15/2015 0943   HCO3 26.2* 08/15/2015 0943   TCO2 24 08/15/2015 1651   O2SAT 65.0 08/17/2015 0350   CBG (last 3)  No results for input(s): GLUCAP in the last 72 hours.  Assessment/Plan: S/P Procedure(s) (LRB): STERNAL WOUND DEBRIDEMENT (N/A) Mobilize Diuresis Diabetes control OR VAC change   LOS: 4 days    [name] III 09/04/2015

## 2015-09-04 NOTE — Progress Notes (Signed)
Pt transferred to OR, vss, RN at bedside. Kathleen Argue S 10:56 AM

## 2015-09-04 NOTE — Evaluation (Signed)
Physical Therapy Evaluation Patient Details Name: [name] [name] MRN: [id number] DOB: 1941/07/10 Today's Date: 09/04/2015   History of Present Illness  pt is a 75 y/o female with h/o HTN and very recent CABG, admitted with upper sternotomy incision infection, s/p I and D with VAC placement.  Clinical Impression  Pt admitted with/for wound infection, s/p I and D with VAC dressing.  Mild changes only from last admission.  Pt should progress well.  Pt currently limited functionally due to the problems listed below.  (see problems list.)  Pt will benefit from PT to maximize function and safety to be able to get home safely with available assist of family.     Follow Up Recommendations No PT follow up;Supervision for mobility/OOB    Equipment Recommendations       Recommendations for Other Services       Precautions / Restrictions Precautions Precautions: Sternal Restrictions Weight Bearing Restrictions: Yes (sternal precautions (wound vac))      Mobility  Bed Mobility Overal bed mobility: Needs Assistance Bed Mobility: Sit to Sidelying   Sidelying to sit: Min guard Supine to sit: Min assist   Sit to sidelying: Min assist General bed mobility comments: holding pillow and using momentum to assist  Transfers Overall transfer level: Needs assistance   Transfers: Sit to/from Stand Sit to Stand: Min assist         General transfer comment: cues for sternal precautions and assist to come forward.  Ambulation/Gait Ambulation/Gait assistance: Supervision Ambulation Distance (Feet): 330 Feet Assistive device: Rolling walker (2 wheeled) Gait Pattern/deviations: Step-through pattern   Gait velocity interpretation: at or above normal speed for age/gender General Gait Details: steady with decent posture once cued  Stairs            Wheelchair Mobility    Modified Rankin (Stroke Patients Only)       Balance     Sitting balance-Leahy Scale: Good       Standing  balance-Leahy Scale: Good                               Pertinent Vitals/Pain Pain Assessment: Faces Faces Pain Scale: Hurts little more Pain Location: sternal Pain Descriptors / Indicators: Grimacing;Discomfort Pain Intervention(s): Monitored during session    Home Living Family/patient expects to be discharged to:: Private residence Living Arrangements: Children Available Help at Discharge: Family;Available 24 hours/day Type of Home: House Home Access: Stairs to enter Entrance Stairs-Rails: Psychiatric nurse of Steps: several Home Layout: Two level Home Equipment: Grab bars - toilet;Grab bars - tub/shower      Prior Function Level of Independence: Independent         Comments: just retired     Journalist, newspaper        Extremity/Trunk Assessment   Upper Extremity Assessment:  (NT formally)           Lower Extremity Assessment: Overall WFL for tasks assessed         Communication   Communication: No difficulties  Cognition Arousal/Alertness: Awake/alert Behavior During Therapy: WFL for tasks assessed/performed Overall Cognitive Status: Within Functional Limits for tasks assessed                      General Comments General comments (skin integrity, edema, etc.): sats maintained at mid 90's and EHR reach 123 bpm briefly    Exercises        Assessment/Plan  PT Assessment Patient needs continued PT services  PT Diagnosis Generalized weakness   PT Problem List Decreased activity tolerance;Decreased mobility;Decreased knowledge of use of DME;Pain  PT Treatment Interventions DME instruction;Gait training;Stair training;Functional mobility training;Therapeutic activities;Patient/family education   PT Goals (Current goals can be found in the Care Plan section) Acute Rehab PT Goals Patient Stated Goal: go home with daughter PT Goal Formulation: With patient Time For Goal Achievement: 09/18/15 Potential to Achieve  Goals: Good    Frequency Min 3X/week   Barriers to discharge        Co-evaluation               End of Session   Activity Tolerance: Patient tolerated treatment well Patient left: in bed;with call bell/phone within reach;with family/visitor present (pt going for VAC change procedure) Nurse Communication: Mobility status         Time: [id number] PT Time Calculation (min) (ACUTE ONLY): 29 min   Charges:   PT Evaluation $PT Eval Moderate Complexity: 1 Procedure PT Treatments $Gait Training: 8-22 mins   PT G Codes:        Iris Tatsch, Tessie Fass 09/04/2015, 10:51 AM 09/04/2015  Donnella Sham, PT [phone]  (pager)

## 2015-09-04 NOTE — Progress Notes (Signed)
CT surgery p.m. Rounds  Status post sternal debridement and wound VAC change All sternal wires have been removed Sternal wound was clean with healthy granulation tissue Muscle flap closure planned on Monday morning with plastic surgery Minimal serosanguineous drainage from wound VAC sponge

## 2015-09-04 NOTE — Transfer of Care (Signed)
Immediate Anesthesia Transfer of Care Note  Patient: [name] [name]  Procedure(s) Performed: Procedure(s): STERNAL WOUND DEBRIDEMENT (N/A) WOUND VAC CHANGE (N/A)  Patient Location: PACU  Anesthesia Type:General  Level of Consciousness: awake, alert , oriented and patient cooperative  Airway & Oxygen Therapy: Patient Spontanous Breathing and Patient connected to nasal cannula oxygen  Post-op Assessment: Report given to RN, Post -op Vital signs reviewed and stable and Patient moving all extremities X 4  Post vital signs: Reviewed and stable  Last Vitals:  Filed Vitals:   09/04/15 1000 09/04/15 1226  BP: 115/62   Pulse: 79   Temp:  36.7 C  Resp: 17     Complications: No apparent anesthesia complications

## 2015-09-04 NOTE — Brief Op Note (Signed)
08/31/2015 - 09/04/2015  12:16 PM  PATIENT:  Rober Minion  75 y.o. female  PRE-OPERATIVE DIAGNOSIS:  STERNAL WOUND INFECTION  POST-OPERATIVE DIAGNOSIS:  STERNAL WOUND INFECTION  PROCEDURE:  Procedure(s): STERNAL WOUND DEBRIDEMENT (N/A) WOUND VAC CHANGE (N/A)  Application of A Cell Powder and coverage with Sorbact  SURGEON:  Surgeon(s) and Role:    * [name], MD - Primary       Audelia Hives DO assisiting surgeon  PHYSICIAN ASSISTANT: na  ASSISTANTS: none   ANESTHESIA:   general  EBL:  Total I/O In: 230 [I.V.:30; IV Piggyback:200] Out: [id number] [Urine:1455; Blood:200]  BLOOD ADMINISTERED:300 ml CC PRBC  DRAINS: none   LOCAL MEDICATIONS USED:  NONE  SPECIMEN:  No Specimen  DISPOSITION OF SPECIMEN:  N/A  COUNTS:  YES  TOURNIQUET:  * No tourniquets in log *  DICTATION: .Dragon Dictation  PLAN OF CARE: return to 2 [place] bed   PATIENT DISPOSITION:  PACU - hemodynamically stable.   Delay start of Pharmacological VTE agent (>24hrs) due to surgical blood loss or risk of bleeding: yes

## 2015-09-04 NOTE — Progress Notes (Signed)
Per Dr. Prescott Gum: keep pt on bedrest until he assesses her in the AM.  Henreitta Leber, RN 8:21 PM 09/04/2015

## 2015-09-04 NOTE — Progress Notes (Signed)
Report called to RN in short stay. Kathleen Argue S 10:23 AM

## 2015-09-05 ENCOUNTER — Encounter (HOSPITAL_COMMUNITY): Payer: Self-pay | Admitting: Cardiothoracic Surgery

## 2015-09-05 ENCOUNTER — Inpatient Hospital Stay (HOSPITAL_COMMUNITY): Payer: Medicare PPO

## 2015-09-05 ENCOUNTER — Encounter: Payer: Medicare PPO | Admitting: Adult Health

## 2015-09-05 LAB — TYPE AND SCREEN
ABO/RH(D): O POS
Antibody Screen: NEGATIVE
Unit division: 0

## 2015-09-05 LAB — CULTURE, BLOOD (ROUTINE X 2)
CULTURE: NO GROWTH
CULTURE: NO GROWTH

## 2015-09-05 LAB — CULTURE, BLOOD (SINGLE): Culture: NO GROWTH

## 2015-09-05 LAB — COMPREHENSIVE METABOLIC PANEL
ALT: 30 U/L (ref 14–54)
AST: 50 U/L — ABNORMAL HIGH (ref 15–41)
Albumin: 1.9 g/dL — ABNORMAL LOW (ref 3.5–5.0)
Alkaline Phosphatase: 103 U/L (ref 38–126)
Anion gap: 10 (ref 5–15)
BUN: 10 mg/dL (ref 6–20)
CO2: 28 mmol/L (ref 22–32)
Calcium: 8.3 mg/dL — ABNORMAL LOW (ref 8.9–10.3)
Chloride: 102 mmol/L (ref 101–111)
Creatinine, Ser: 1.18 mg/dL — ABNORMAL HIGH (ref 0.44–1.00)
GFR calc Af Amer: 51 mL/min — ABNORMAL LOW (ref >60)
GFR calc non Af Amer: 44 mL/min — ABNORMAL LOW (ref >60)
Glucose, Bld: 108 mg/dL — ABNORMAL HIGH (ref 65–99)
Potassium: 4 mmol/L (ref 3.5–5.1)
Sodium: 140 mmol/L (ref 135–145)
Total Bilirubin: 0.8 mg/dL (ref 0.3–1.2)
Total Protein: 5.6 g/dL — ABNORMAL LOW (ref 6.5–8.1)

## 2015-09-05 LAB — CULTURE, ROUTINE-ABSCESS: Culture: NO GROWTH

## 2015-09-05 LAB — CBC
HCT: 30 % — ABNORMAL LOW (ref 36.0–46.0)
Hemoglobin: 9.4 g/dL — ABNORMAL LOW (ref 12.0–15.0)
MCH: 27.2 pg (ref 26.0–34.0)
MCHC: 31.3 g/dL (ref 30.0–36.0)
MCV: 86.7 fL (ref 78.0–100.0)
Platelets: 215 10*3/uL (ref 150–400)
RBC: 3.46 MIL/uL — ABNORMAL LOW (ref 3.87–5.11)
RDW: 14.5 % (ref 11.5–15.5)
WBC: 7.9 10*3/uL (ref 4.0–10.5)

## 2015-09-05 LAB — VANCOMYCIN, TROUGH: Vancomycin Tr: 35 ug/mL (ref 10.0–20.0)

## 2015-09-05 MED ORDER — CEFAZOLIN SODIUM-DEXTROSE 2-3 GM-% IV SOLR
2.0000 g | INTRAVENOUS | Status: DC
  Filled 2015-09-05: qty 50

## 2015-09-05 NOTE — Op Note (Signed)
NAME:  [name] [name], [name] [name] NO.:  [id number]  MEDICAL RECORD NO.:  [id number]  LOCATION:  2S06C                        FACILITY:  [facility]  PHYSICIAN:  [name], M.D.  DATE OF BIRTH:  June 01, 1941  DATE OF PROCEDURE:  09/04/2015 DATE OF DISCHARGE:                              OPERATIVE REPORT   OPERATIONS: 1. Sternal wound debridement, excisional, and irrigation. 2. Wound VAC change. 3. Application of ACell powder, 1.5 g with coverage using a sheet of     Sorbact.  SURGEON:  [name], M.D.  ASSISTING SURGEON:  Lyndee Leo Sanger Dillingham, D.O.  ANESTHESIA:  General.  PREOPERATIVE DIAGNOSIS:  Sternal wound infection, status post prior coronary artery bypass graft for unstable angina with nonhealing sternal wound.  POSTOPERATIVE DIAGNOSIS:  Sternal wound infection, status post prior coronary artery bypass graft for unstable angina with nonhealing sternal wound.  CLINICAL NOTE:  The patient is a 75 year old, obese female, who returned to the operating room for wound VAC change and debridement.  I consulted Dr. Marla Roe for her reconstructive wound expertise to assess for possible wound closure in the future with a muscle flap and she scrubbed in and participated in the entire procedure.  DESCRIPTION OF PROCEDURE:  The patient was brought to the operating room and placed supine on the operating room table.  She was intubated for general anesthesia.  The previous wound VAC sheet was removed as well as a black sponge.  Chest was prepped and draped as a sterile field.  A proper time-out was performed.  The old white sponge was removed from in between the sternal edges.  It was apparent that sternal wires had pulled through the friable sternal bone and these were cut and removed, so that all the sternal wires were removed.  There was no visible heart or bypass grafts.  There was no active bleeding or purulence.  There was nonhealing granulation tissue on  the edges of the sternum and in the mediastinal tissue and these were all cleared with curettage or scalpel.  The fragment of the sternum on the left side was debrided with rongeur back to the healthy tissue.  The wound was then irrigated with 1.5 liters of pulse lavage saline as well as vancomycin irrigation.  This left the wound looking very clean with good blood supply and it was felt that the patient would be a candidate for muscle flap closure approximately 5 days.  We next placed 1.5 g of ACell powder in the wound.  This was covered with a sheet of Sorbact and on top of that, a medium wound VAC sponge. The wound was compressed using some interrupted mattress sutures of Monocryl.  The wound VAC sheaths were then placed over the sponge.  The wound VAC suction line was attached and connected to a wound VAC pump.  The patient was reversed from anesthesia and returned to the recovery room in stable condition.     [name], M.D.     PV/MEDQ  D:  09/04/2015  T:  09/05/2015  Job:  [id number]  cc:   Theodoro Kos, DO

## 2015-09-05 NOTE — Progress Notes (Signed)
      [name]  No new issues today  BP 120/68 mmHg  Pulse 83  Temp(Src) 97.9 F (36.6 C) (Oral)  Resp 17  Ht 5\' 10"  (1.778 m)  Wt 184 lb 4.9 oz (83.6 kg)  BMI 26.44 kg/m2  SpO2 97%  For muscle flap on Monday  Anders Hohmann C. Roxan Hockey, MD [facility] [phone]

## 2015-09-05 NOTE — Progress Notes (Signed)
CRITICAL VALUE ALERT  Critical value received:  Vanc.trough = 35  Date of notification:  t  Time of notification:  1942  Critical value read back:Yes.    Nurse who received alert:  Hettie Holstein   MD notified (1st page):  Philis Nettle, PharmMD  Time of first page:  1944  MD notified (2nd page):  Time of second page:  Responding MD:  Melody Comas MD  Time MD responded:  [phone]

## 2015-09-05 NOTE — Progress Notes (Signed)
1 Day Post-Op Procedure(s) (LRB): STERNAL WOUND DEBRIDEMENT (N/A) WOUND VAC CHANGE (N/A) Subjective: Sternal wound infection- doing well with VAC For muscle flap mon am  Objective: Vital signs in last 24 hours: Temp:  [97.9 F (36.6 C)-99.1 F (37.3 C)] 97.9 F (36.6 C) (02/10 1137) Pulse Rate:  [71-98] 83 (02/10 1600) Cardiac Rhythm:  [-] Normal sinus rhythm (02/10 1600) Resp:  [14-25] 18 (02/10 1600) BP: (100-135)/(57-83) 112/71 mmHg (02/10 1600) SpO2:  [94 %-100 %] 97 % (02/10 1600)  Hemodynamic parameters for last 24 hours:   nsr Intake/Output from previous day: 02/09 0701 - 02/10 0700 In: 1550 [I.V.:550; Blood:300; IV Piggyback:700] Out: 3135 [Urine:2585; Drains:350; Blood:200] Intake/Output this shift: Total I/O In: 270 [P.O.:120; I.V.:100; IV Piggyback:50] Out: 125 [Urine:125]       Exam    General- alert and comfortable   Lungs- clear without rales, wheezes   Cor- regular rate and rhythm, no murmur , gallop   Abdomen- soft, non-tender   Extremities - warm, non-tender, minimal edema   Neuro- oriented, appropriate, no focal weakness   Lab Results:  Recent Labs  09/04/15 1557 09/05/15 0430  WBC 8.7 7.9  HGB 10.1* 9.4*  HCT 32.2* 30.0*  PLT 250 215   BMET:  Recent Labs  09/04/15 0540 09/05/15 0430  NA 137 140  K 3.8 4.0  CL 101 102  CO2 26 28  GLUCOSE 127* 108*  BUN 11 10  CREATININE 0.85 1.18*  CALCIUM 8.6* 8.3*    PT/INR: No results for input(s): LABPROT, INR in the last 72 hours. ABG    Component Value Date/Time   PHART 7.458* 08/15/2015 0943   HCO3 26.2* 08/15/2015 0943   TCO2 24 08/15/2015 1651   O2SAT 65.0 08/17/2015 0350   CBG (last 3)  No results for input(s): GLUCAP in the last 72 hours.  Assessment/Plan: S/P Procedure(s) (LRB): STERNAL WOUND DEBRIDEMENT (N/A) WOUND VAC CHANGE (N/A) Continue antibiotics, good nutrition OR with Plastic surgery Monday   LOS: 5 days    [name] [name] 09/05/2015

## 2015-09-05 NOTE — Progress Notes (Signed)
Utilization Review Completed.  

## 2015-09-05 NOTE — Progress Notes (Addendum)
Pharmacy Antibiotic Note  [name] [name] is a 75 y.o. female admitted on 08/31/2015 with sternal wound infection.  Pharmacy has been consulted for vancomycin dosing. Currently day 6 of broad spectrum abx with vanc and Zosyn. I&D and wound vac changed yesterday, muscle flap closure planned Monday. NGTD on all cx.  Plan: - Consider narrowing abx therapy.  - Vancomycin trough at 1900 tonight to assess for accumulation given duration of therapy and slight increase in Cr.  - F/u duration of therapy  Height: 5\' 10"  (177.8 cm) Weight: 184 lb 4.9 oz (83.6 kg) IBW/kg (Calculated) : 68.5  Temp (24hrs), Avg:98.3 F (36.8 C), Min:98 F (36.7 C), Max:99.1 F (37.3 C)   Recent Labs Lab 08/31/15 1558  09/01/15 1536 09/02/15 0420 09/02/15 1040 09/02/15 1810 09/03/15 0526 09/04/15 0540 09/04/15 1557 09/05/15 0430  WBC  --   < > 8.6 7.6 8.6  --  7.4 7.6 8.7 7.9  CREATININE  --   < > 0.78 0.92  --   --  0.95 0.85  --  1.18*  LATICACIDVEN 1.40  --   --   --   --   --   --   --   --   --   VANCOTROUGH  --   --   --   --   --  17  --   --   --   --   < > = values in this interval not displayed.  Estimated Creatinine Clearance: 49.2 mL/min (by C-G formula based on Cr of 1.18).    Allergies  Allergen Reactions  . Other Hives and Itching    Food Dye Red Dye #40  . Peanut-Containing Drug Products Rash    Mild rash    Antimicrobials this admission: 2/5 vancomycin >>  2/6 Zosyn >>   Dose adjustments this admission: 2/7 vanc trough 17, no adjustments made  Microbiology results: 2/5 BCx: no growth final 2/6 UCx: no growth final  2/6 Sternal wound: no growth x 3 days    Thank you for allowing pharmacy to be a part of this patient's care.  Wynelle Fanny 09/05/2015 10:52 AM

## 2015-09-05 NOTE — Progress Notes (Signed)
Pharmacy Antibiotic Note  [name] [name] is a 75 y.o. female admitted on 08/31/2015 with sternal wound infection.  Pharmacy has been consulted for vancomycin dosing. Currently day 6 of broad spectrum abx with vanc and Zosyn. I&D and wound vac changed yesterday, muscle flap closure planned Monday. NGTD on all cx.  Vancomycin trough level = 35   Plan: -Hold vancomycin now -Recheck vancomycin random level with morning labs tomorrow to determine further dosing.  Height: 5\' 10"  (177.8 cm) Weight: 184 lb 4.9 oz (83.6 kg) IBW/kg (Calculated) : 68.5  Temp (24hrs), Avg:98.2 F (36.8 C), Min:97.9 F (36.6 C), Max:98.3 F (36.8 C)   Recent Labs Lab 08/31/15 1558  09/01/15 1536 09/02/15 0420 09/02/15 1040 09/02/15 1810 09/03/15 0526 09/04/15 0540 09/04/15 1557 09/05/15 0430 09/05/15 1850  WBC  --   < > 8.6 7.6 8.6  --  7.4 7.6 8.7 7.9  --   CREATININE  --   < > 0.78 0.92  --   --  0.95 0.85  --  1.18*  --   LATICACIDVEN 1.40  --   --   --   --   --   --   --   --   --   --   VANCOTROUGH  --   --   --   --   --  17  --   --   --   --  35*  < > = values in this interval not displayed.  Estimated Creatinine Clearance: 49.2 mL/min (by C-G formula based on Cr of 1.18).    Allergies  Allergen Reactions  . Other Hives and Itching    Food Dye Red Dye #40  . Peanut-Containing Drug Products Rash    Mild rash    Antimicrobials this admission: 2/5 vancomycin >>  2/6 Zosyn >>   Dose adjustments this admission: 2/7 vanc trough 17, no adjustments made  Microbiology results: 2/5 BCx: no growth final 2/6 UCx: no growth final  2/6 Sternal wound: no growth x 3 days    Thank you for allowing pharmacy to be a part of this patient's care.  Uvaldo Rising, BCPS  Clinical Pharmacist Pager [phone]  09/05/2015 7:58 PM

## 2015-09-06 ENCOUNTER — Inpatient Hospital Stay (HOSPITAL_COMMUNITY): Payer: Medicare PPO

## 2015-09-06 LAB — BASIC METABOLIC PANEL
Anion gap: 11 (ref 5–15)
BUN: 15 mg/dL (ref 6–20)
CALCIUM: 8.4 mg/dL — AB (ref 8.9–10.3)
CO2: 25 mmol/L (ref 22–32)
Chloride: 101 mmol/L (ref 101–111)
Creatinine, Ser: 2 mg/dL — ABNORMAL HIGH (ref 0.44–1.00)
GFR calc Af Amer: 27 mL/min — ABNORMAL LOW (ref >60)
GFR calc non Af Amer: 23 mL/min — ABNORMAL LOW (ref >60)
Glucose, Bld: 117 mg/dL — ABNORMAL HIGH (ref 65–99)
POTASSIUM: 3.5 mmol/L (ref 3.5–5.1)
SODIUM: 137 mmol/L (ref 135–145)

## 2015-09-06 LAB — COMPREHENSIVE METABOLIC PANEL
ALT: 30 U/L (ref 14–54)
AST: 44 U/L — ABNORMAL HIGH (ref 15–41)
Albumin: 2 g/dL — ABNORMAL LOW (ref 3.5–5.0)
Alkaline Phosphatase: 107 U/L (ref 38–126)
Anion gap: 13 (ref 5–15)
BUN: 16 mg/dL (ref 6–20)
CO2: 26 mmol/L (ref 22–32)
Calcium: 8.6 mg/dL — ABNORMAL LOW (ref 8.9–10.3)
Chloride: 101 mmol/L (ref 101–111)
Creatinine, Ser: 1.78 mg/dL — ABNORMAL HIGH (ref 0.44–1.00)
GFR calc Af Amer: 31 mL/min — ABNORMAL LOW (ref >60)
GFR calc non Af Amer: 27 mL/min — ABNORMAL LOW (ref >60)
Glucose, Bld: 127 mg/dL — ABNORMAL HIGH (ref 65–99)
Potassium: 3.8 mmol/L (ref 3.5–5.1)
Sodium: 140 mmol/L (ref 135–145)
Total Bilirubin: 0.4 mg/dL (ref 0.3–1.2)
Total Protein: 5.3 g/dL — ABNORMAL LOW (ref 6.5–8.1)

## 2015-09-06 LAB — CBC
HCT: 30.6 % — ABNORMAL LOW (ref 36.0–46.0)
Hemoglobin: 9.5 g/dL — ABNORMAL LOW (ref 12.0–15.0)
MCH: 27 pg (ref 26.0–34.0)
MCHC: 31 g/dL (ref 30.0–36.0)
MCV: 86.9 fL (ref 78.0–100.0)
Platelets: 222 10*3/uL (ref 150–400)
RBC: 3.52 MIL/uL — ABNORMAL LOW (ref 3.87–5.11)
RDW: 14.5 % (ref 11.5–15.5)
WBC: 8.3 10*3/uL (ref 4.0–10.5)

## 2015-09-06 LAB — ANAEROBIC CULTURE

## 2015-09-06 LAB — VANCOMYCIN, RANDOM: Vancomycin Rm: 30 ug/mL

## 2015-09-06 MED ORDER — SODIUM CHLORIDE 0.9 % IV SOLN
INTRAVENOUS | Status: DC
  Administered 2015-09-06: 19:00:00 via INTRAVENOUS

## 2015-09-06 MED ORDER — PIPERACILLIN-TAZOBACTAM IN DEX 2-0.25 GM/50ML IV SOLN
2.2500 g | Freq: Three times a day (TID) | INTRAVENOUS | Status: DC
  Administered 2015-09-06 – 2015-09-07 (×3): 2.25 g via INTRAVENOUS
  Filled 2015-09-06 (×6): qty 50

## 2015-09-06 NOTE — Progress Notes (Signed)
Pharmacy Antibiotic Note  [name] [name] is a 75 y.o. female admitted on 08/31/2015 with sternal wound infection.  Pharmacy has been consulted for vancomycin dosing. Currently day 7 of broad spectrum abx with vanc and Zosyn. I&D and wound vac changed 2/09, muscle flap closure planned Monday. NGTD on all cx.  Vancomycin random level 30 still supratherapeutic this AM. Last dose on 2/10 at 0630. Calculated Ke = 0.0202hr-1, half life of 34h.   Plan: -Continue to hold vancomycin -Recheck vancomycin random level with morning labs tomorrow to determine further dosing  Height: 5\' 10"  (177.8 cm) Weight: 184 lb 4.9 oz (83.6 kg) IBW/kg (Calculated) : 68.5  Temp (24hrs), Avg:97.9 F (36.6 C), Min:97.6 F (36.4 C), Max:98.2 F (36.8 C)   Recent Labs Lab 08/31/15 1558  09/02/15 0420  09/02/15 1810 09/03/15 0526 09/04/15 0540 09/04/15 1557 09/05/15 0430 09/05/15 1850 09/06/15 0423  WBC  --   < > 7.6  < >  --  7.4 7.6 8.7 7.9  --  8.3  CREATININE  --   < > 0.92  --   --  0.95 0.85  --  1.18*  --  1.78*  LATICACIDVEN 1.40  --   --   --   --   --   --   --   --   --   --   VANCOTROUGH  --   --   --   --  17  --   --   --   --  35*  --   VANCORANDOM  --   --   --   --   --   --   --   --   --   --  30  < > = values in this interval not displayed.  Estimated Creatinine Clearance: 32.6 mL/min (by C-G formula based on Cr of 1.78).    Allergies  Allergen Reactions  . Other Hives and Itching    Food Dye Red Dye #40  . Peanut-Containing Drug Products Rash    Mild rash    Antimicrobials this admission: 2/5 vancomycin >>  2/6 Zosyn >>   Dose adjustments this admission: 2/7 vanc trough 17, no adjustments made  Microbiology results: 2/5 BCx: no growth final 2/6 UCx: no growth final  2/6 Sternal wound: no growth final 2/6 anaerobic cx>> no growth x 5 days   Thank you for allowing pharmacy to be a part of this patient's care.   Heloise Ochoa, [place].D., BCPS PGY2 Cardiology Pharmacy  Resident Pager: [phone] @T @  09/06/2015 7:32 AM

## 2015-09-06 NOTE — Progress Notes (Signed)
2 Days Post-Op Procedure(s) (LRB): STERNAL WOUND DEBRIDEMENT (N/A) WOUND VAC CHANGE (N/A) Subjective: Some nausea, loose stools  Objective: Vital signs in last 24 hours: Temp:  [97.6 F (36.4 C)-97.9 F (36.6 C)] 97.6 F (36.4 C) (02/11 0440) Pulse Rate:  [71-88] 81 (02/11 0600) Cardiac Rhythm:  [-] Normal sinus rhythm (02/10 2000) Resp:  [16-25] 25 (02/11 0600) BP: (102-148)/(57-82) 148/69 mmHg (02/11 0600) SpO2:  [94 %-98 %] 96 % (02/11 0600)  Hemodynamic parameters for last 24 hours:    Intake/Output from previous day: 02/10 0701 - 02/11 0700 In: 1140 [P.O.:840; I.V.:150; IV Piggyback:150] Out: 1450 [Urine:1350; Drains:100] Intake/Output this shift:    General appearance: alert, cooperative and no distress Neurologic: intact Heart: regular rate and rhythm Lungs: diminished breath sounds bibasilar Abdomen: normal findings: soft, non-tender Wound: vac in place  Lab Results:  Recent Labs  09/05/15 0430 09/06/15 0423  WBC 7.9 8.3  HGB 9.4* 9.5*  HCT 30.0* 30.6*  PLT 215 222   BMET:  Recent Labs  09/05/15 0430 09/06/15 0423  NA 140 140  K 4.0 3.8  CL 102 101  CO2 28 26  GLUCOSE 108* 127*  BUN 10 16  CREATININE 1.18* 1.78*  CALCIUM 8.3* 8.6*    PT/INR: No results for input(s): LABPROT, INR in the last 72 hours. ABG    Component Value Date/Time   PHART 7.458* 08/15/2015 0943   HCO3 26.2* 08/15/2015 0943   TCO2 24 08/15/2015 1651   O2SAT 65.0 08/17/2015 0350   CBG (last 3)  No results for input(s): GLUCAP in the last 72 hours.  Assessment/Plan: S/P Procedure(s) (LRB): STERNAL WOUND DEBRIDEMENT (N/A) WOUND VAC CHANGE (N/A) -  CV- stable  RESP- small left effusion, IS  RENAL- creatinine up- vanco dosing adjusted by pharmacy, will decrease zosyn to 2.25 gm  Recheck BMET this afternoon  ID vanco and zosyn for retrosternal abscess.   WOUND- for flap on Monday   LOS: 6 days    Melrose Nakayama 09/06/2015

## 2015-09-06 NOTE — Progress Notes (Signed)
ANTIBIOTIC CONSULT NOTE - INITIAL  Pharmacy Consult for Vancomycin Indication: Sternal wound infection.  Allergies  Allergen Reactions  . Other Hives and Itching    Food Dye Red Dye #40  . Peanut-Containing Drug Products Rash    Mild rash    Patient Measurements: Height: 5\' 10"  (177.8 cm) Weight: 184 lb 4.9 oz (83.6 kg) IBW/kg (Calculated) : 68.5 Adjusted Body Weight:    Vital Signs: Temp: 98.2 F (36.8 C) (02/11 0838) Temp Source: Oral (02/11 0838) BP: 152/79 mmHg (02/11 0800) Pulse Rate: 84 (02/11 0800) Intake/Output from previous day: 02/10 0701 - 02/11 0700 In: 1140 [P.O.:840; I.V.:150; IV Piggyback:150] Out: 1450 [Urine:1350; Drains:100] Intake/Output from this shift: Total I/O In: -  Out: 125 [Urine:125]  Labs:  Recent Labs  09/04/15 0540 09/04/15 1557 09/05/15 0430 09/06/15 0423  WBC 7.6 8.7 7.9 8.3  HGB 9.2* 10.1* 9.4* 9.5*  PLT 256 250 215 222  CREATININE 0.85  --  1.18* 1.78*   Estimated Creatinine Clearance: 32.6 mL/min (by C-G formula based on Cr of 1.78).  Recent Labs  09/05/15 1850 09/06/15 0423  VANCOTROUGH 35*  --   VANCORANDOM  --  30     Assessment: 75 year old female who underwent CABG 1/21. Now with postop wound abscess, wound culture collected. Start IV vancomycin.  Infectious Disease:Day #7 V/Z for post CABG wound abscess. I&D 2/6 + wound vac. Afebrile. WBC wnl. Renal ok. I&D and wound vac change 2/9. Planned muscle flap closure 2/13. Scr jumped 1.18>1.78 today  Vanc 2/5>> *2/7 VT 17 ( ~ 1 -2 hours early)  * 2/10 VT 35 (dose held) * 2/11 random 30 Zosyn 2/6>>  2/6 anaerobic cx>> NG  2/6 UCx: Negative 2/6 Sternum wound: negative 2/5 wound cx: Negative 2/5 BC x 2: Negative   Goal of Therapy:  Vancomycin trough level 15-20 mcg/ml  Plan:  Zosyn 2.28 q8hr (changed by MD 2/11) Vanc--HOLD and follow random levels. Muscle flap closure on 2/13  [name] [name] [name] [name], PharmD, BCPS Clinical Staff Pharmacist Pager  [phone]  [name] [name] 09/06/2015,11:09 AM

## 2015-09-06 NOTE — Progress Notes (Signed)
      [address]       [place] [address]             [phone]      Resting comfortably  BP 129/78 mmHg  Pulse 88  Temp(Src) 98.1 F (36.7 C) (Oral)  Resp 19  Ht 5\' 10"  (1.778 m)  Wt 184 lb 4.9 oz (83.6 kg)  BMI 26.44 kg/m2  SpO2 98%   Intake/Output Summary (Last 24 hours) at 09/06/15 1811 Last data filed at 09/06/15 1200  Gross per 24 hour  Intake    610 ml  Output    825 ml  Net   -215 ml   Creatinine 2.0 up from 1.78, hopefully plateauing Will give 500 ml NS in case she is a little on the dry side vanco on hold due to high trough. Zosyn dose decreased  Remo Lipps C. Roxan Hockey, MD [facility] [phone]

## 2015-09-07 ENCOUNTER — Inpatient Hospital Stay (HOSPITAL_COMMUNITY): Payer: Medicare PPO

## 2015-09-07 ENCOUNTER — Encounter (HOSPITAL_COMMUNITY): Payer: Self-pay | Admitting: Plastic Surgery

## 2015-09-07 LAB — CBC
HCT: 31.1 % — ABNORMAL LOW (ref 36.0–46.0)
HCT: 30.2 % — ABNORMAL LOW (ref 36.0–46.0)
Hemoglobin: 9.7 g/dL — ABNORMAL LOW (ref 12.0–15.0)
Hemoglobin: 9.4 g/dL — ABNORMAL LOW (ref 12.0–15.0)
MCH: 27.2 pg (ref 26.0–34.0)
MCH: 27.3 pg (ref 26.0–34.0)
MCHC: 31.2 g/dL (ref 30.0–36.0)
MCHC: 31.1 g/dL (ref 30.0–36.0)
MCV: 87.1 fL (ref 78.0–100.0)
MCV: 87.8 fL (ref 78.0–100.0)
Platelets: 253 10*3/uL (ref 150–400)
Platelets: 225 10*3/uL (ref 150–400)
RBC: 3.57 MIL/uL — ABNORMAL LOW (ref 3.87–5.11)
RBC: 3.44 MIL/uL — ABNORMAL LOW (ref 3.87–5.11)
RDW: 14.6 % (ref 11.5–15.5)
RDW: 14.8 % (ref 11.5–15.5)
WBC: 8.9 10*3/uL (ref 4.0–10.5)
WBC: 10.1 10*3/uL (ref 4.0–10.5)

## 2015-09-07 LAB — POCT I-STAT 3, ART BLOOD GAS (G3+)
Bicarbonate: 24 mEq/L (ref 20.0–24.0)
O2 Saturation: 94 %
TCO2: 25 mmol/L (ref 0–100)
pCO2 arterial: 33.7 mmHg — ABNORMAL LOW (ref 35.0–45.0)
pH, Arterial: 7.461 — ABNORMAL HIGH (ref 7.350–7.450)
pO2, Arterial: 67 mmHg — ABNORMAL LOW (ref 80.0–100.0)

## 2015-09-07 LAB — PROTIME-INR
INR: 1.24 (ref 0.00–1.49)
Prothrombin Time: 15.7 seconds — ABNORMAL HIGH (ref 11.6–15.2)

## 2015-09-07 LAB — PREPARE RBC (CROSSMATCH)

## 2015-09-07 LAB — COMPREHENSIVE METABOLIC PANEL
ALT: 24 U/L (ref 14–54)
AST: 27 U/L (ref 15–41)
Albumin: 2.1 g/dL — ABNORMAL LOW (ref 3.5–5.0)
Alkaline Phosphatase: 112 U/L (ref 38–126)
Anion gap: 9 (ref 5–15)
BUN: 16 mg/dL (ref 6–20)
CO2: 26 mmol/L (ref 22–32)
Calcium: 8.6 mg/dL — ABNORMAL LOW (ref 8.9–10.3)
Chloride: 106 mmol/L (ref 101–111)
Creatinine, Ser: 1.88 mg/dL — ABNORMAL HIGH (ref 0.44–1.00)
GFR calc Af Amer: 29 mL/min — ABNORMAL LOW (ref >60)
GFR calc non Af Amer: 25 mL/min — ABNORMAL LOW (ref >60)
Glucose, Bld: 125 mg/dL — ABNORMAL HIGH (ref 65–99)
Potassium: 3.5 mmol/L (ref 3.5–5.1)
Sodium: 141 mmol/L (ref 135–145)
Total Bilirubin: 0.6 mg/dL (ref 0.3–1.2)
Total Protein: 5.9 g/dL — ABNORMAL LOW (ref 6.5–8.1)

## 2015-09-07 LAB — APTT: aPTT: 38 seconds — ABNORMAL HIGH (ref 24–37)

## 2015-09-07 LAB — VANCOMYCIN, RANDOM: Vancomycin Rm: 17 ug/mL

## 2015-09-07 MED ORDER — PIPERACILLIN-TAZOBACTAM 3.375 G IVPB
3.3750 g | Freq: Three times a day (TID) | INTRAVENOUS | Status: DC
  Administered 2015-09-07 – 2015-09-08 (×3): 3.375 g via INTRAVENOUS
  Filled 2015-09-07 (×5): qty 50

## 2015-09-07 MED ORDER — DEXTROSE 5 % IV SOLN: 1.5000 g | INTRAVENOUS | Status: DC

## 2015-09-07 MED ORDER — VANCOMYCIN HCL IN DEXTROSE 1-5 GM/200ML-% IV SOLN
1000.0000 mg | INTRAVENOUS | Status: DC
  Administered 2015-09-07: 1000 mg via INTRAVENOUS
  Filled 2015-09-07: qty 200

## 2015-09-07 MED ORDER — POTASSIUM CHLORIDE 10 MEQ/50ML IV SOLN
10.0000 meq | INTRAVENOUS | Status: AC
  Administered 2015-09-07 (×3): 10 meq via INTRAVENOUS

## 2015-09-07 MED ORDER — ALTEPLASE 2 MG IJ SOLR
2.0000 mg | Freq: Once | INTRAMUSCULAR | Status: AC
  Administered 2015-09-07: 2 mg
  Filled 2015-09-07 (×2): qty 2

## 2015-09-07 NOTE — Progress Notes (Signed)
VAC was alarming.  I took off her binder and the top of the VAC suction was off.  Noted a hole in the clear dressing.  Resealed wound, VAC suctioning back at 125.  Dr. Roxan Hockey notified.  No new orders received

## 2015-09-07 NOTE — Progress Notes (Signed)
3 Days Post-Op Procedure(s) (LRB): STERNAL WOUND DEBRIDEMENT (N/A) WOUND VAC CHANGE (N/A) Subjective: No complaints  Objective: Vital signs in last 24 hours: Temp:  [97.7 F (36.5 C)-98.5 F (36.9 C)] 98.4 F (36.9 C) (02/12 0740) Pulse Rate:  [77-91] 84 (02/12 0600) Cardiac Rhythm:  [-] Normal sinus rhythm (02/11 2000) Resp:  [14-25] 20 (02/12 0600) BP: (99-153)/(57-82) 139/72 mmHg (02/12 0600) SpO2:  [95 %-99 %] 95 % (02/12 0600)  Hemodynamic parameters for last 24 hours:    Intake/Output from previous day: 02/11 0701 - 02/12 0700 In: 1250 [P.O.:600; I.V.:500; IV Piggyback:150] Out: 1315 [Urine:1215; Drains:100] Intake/Output this shift:    General appearance: cooperative and no distress Neurologic: intact Heart: regular rate and rhythm Lungs: clear to auscultation bilaterally Wound: VAC in place  Lab Results:  Recent Labs  09/06/15 0423 09/07/15 0435  WBC 8.3 8.9  HGB 9.5* 9.7*  HCT 30.6* 31.1*  PLT 222 253   BMET:  Recent Labs  09/06/15 1627 09/07/15 0435  NA 137 141  K 3.5 3.5  CL 101 106  CO2 25 26  GLUCOSE 117* 125*  BUN 15 16  CREATININE 2.00* 1.88*  CALCIUM 8.4* 8.6*    PT/INR: No results for input(s): LABPROT, INR in the last 72 hours. ABG    Component Value Date/Time   PHART 7.458* 08/15/2015 0943   HCO3 26.2* 08/15/2015 0943   TCO2 24 08/15/2015 1651   O2SAT 65.0 08/17/2015 0350   CBG (last 3)  No results for input(s): GLUCAP in the last 72 hours.  Assessment/Plan: S/P Procedure(s) (LRB): STERNAL WOUND DEBRIDEMENT (N/A) WOUND VAC CHANGE (N/A) -  CV- stable  RESP- stable  RENAL- creatinine slightly better this AM down to 1.88   ID- on vanco (dosing on hold due to levels) and zosyn- afebrile  WOUND- for flap tomorrow   LOS: 7 days    Melrose Nakayama 09/07/2015

## 2015-09-07 NOTE — Progress Notes (Signed)
Called pharmacy to clarify Zosyn order.  Zosyn 3.375 grams given per pharmacist

## 2015-09-07 NOTE — Plan of Care (Signed)
Problem: Pain Managment: Goal: General experience of comfort will improve Outcome: Completed/Met Date Met:  09/07/15 Pt continues to deny pain after 4 debridements and wound vac changes. Completed 5 day course of Tylenol.  Problem: Activity: Goal: Risk for activity intolerance will decrease Outcome: Progressing Pt able get OOB with minimal assistance to chair. Sternal wound vac in place, ambulation delayed.  Problem: Nutrition: Goal: Adequate nutrition will be maintained Outcome: Completed/Met Date Met:  09/07/15 Pt tolerating Carb. Mod diet, taking in 50-75% of each meal. Pt has had 2-3 BM per day without need of stool softeners.

## 2015-09-07 NOTE — Progress Notes (Signed)
K+= 3.5 and creat= 1.88 w/ urine o/p > 30cc/hr; TCTS KCL protocol initiated with 10 mEq KCL in 50cc IV x 3, each over one hour.

## 2015-09-07 NOTE — Consult Note (Signed)
Reason for Consult: Sternal wound Referring Physician: Dahlia Byes  Consult done intraoperatively on 09/05/2015  [name] [name] is an 75 y.o. female.  HPI: The patient is a 75 yrs old bf here after coronary artery bypass graft x 3 on 08/14/2015.  The left internal mammary artery to LAD, saphenous vein graft to ramus intermediate and saphenous vein graft to posterior descending was done.  She had an endoscopic harvest of the right leg greater saphenous vein.  She present a few weeks later with concerns of a retrosternal abscess and sternal wound dehiscence.  She was taken to the OR for debridement last on 2/10 when I was consulted intraoperatively.  The area was clean and was debrided well.  There was no further sign of abscess or infection.  Acell powder was placed to start the promotion of granulation tissue.  The VAC was placed over a sorbac sheet.  The patient was then seen by me on 2/11 to further exam, introduce and explain the surgery.  The vac was in place with an excellent seal and the breast binder in place.   Past Medical History  Diagnosis Date  . Hypertension   . Anxiety about health 08/28/2015    Past Surgical History  Procedure Laterality Date  . Back surgery    . Abdominal hysterectomy    . Appendectomy    . Colonoscopy N/A 03/29/2013    Procedure: COLONOSCOPY;  Surgeon: Rogene Houston, MD;  [facility]: [facility];  Service: Endoscopy;  Laterality: N/A;  930  . Cardiac catheterization N/A 08/10/2015    Procedure: Left Heart Cath and Coronary Angiography;  Surgeon: [name], MD;  Location: [place] [facility];  Service: Cardiovascular;  Laterality: N/A;  . Cardiac catheterization  08/10/2015    Procedure: Coronary Balloon Angioplasty;  Surgeon: [name], MD;  Location: [place] [facility];  Service: Cardiovascular;;  . Coronary artery bypass graft N/A 08/14/2015    Procedure: CORONARY ARTERY BYPASS GRAFTING (CABG)X3 LIMA-LAD; SVG-RAMUS; SVG-PD TRANSESOPHAGEAL  ECHOCARDIOGRAM (TEE);  Surgeon: [name], MD;  Location: [place];  Service: Open Heart Surgery;  Laterality: N/A;  . Tee without cardioversion N/A 08/14/2015    Procedure: TRANSESOPHAGEAL ECHOCARDIOGRAM (TEE);  Surgeon: [name], MD;  Location: [place];  Service: Open Heart Surgery;  Laterality: N/A;  . Sternal wound debridement N/A 09/01/2015    Procedure: STERNAL WOUND DEBRIDEMENT;  Surgeon: [name], MD;  Location: [place];  Service: Thoracic;  Laterality: N/A;  . Sternal wound debridement N/A 09/04/2015    Procedure: STERNAL WOUND DEBRIDEMENT;  Surgeon: [name], MD;  Location: [place];  Service: Thoracic;  Laterality: N/A;  . Application of wound vac N/A 09/04/2015    Procedure: WOUND VAC CHANGE;  Surgeon: [name], MD;  Location: [facility] OR;  Service: Thoracic;  Laterality: N/A;    Family History  Problem Relation Age of Onset  . Colon cancer Neg Hx     Social History:  reports that she has never smoked. She does not have any smokeless tobacco history on file. She reports that she does not drink alcohol or use illicit drugs.  Allergies:  Allergies  Allergen Reactions  . Other Hives and Itching    Food Dye Red Dye #40  . Peanut-Containing Drug Products Rash    Mild rash    Medications: I have reviewed the patient's current medications.  Results for orders placed or performed during the hospital encounter of 08/31/15 (from the past  48 hour(s))  Vancomycin, trough     Status: Abnormal   Collection Time: 09/05/15  6:50 PM  Result Value Ref Range   Vancomycin Tr 35 (HH) 10.0 - 20.0 ug/mL    Comment: CRITICAL RESULT CALLED TO, READ BACK BY AND VERIFIED WITH: J PERRIN,RN 1942 09/05/2015 WBOND   Comprehensive metabolic panel     Status: Abnormal   Collection Time: 09/06/15  4:23 AM  Result Value Ref Range   Sodium 140 135 - 145 mmol/L   Potassium 3.8 3.5 - 5.1 mmol/L   Chloride 101 101 - 111 mmol/L   CO2 26 22 - 32 mmol/L   Glucose, Bld 127 (H) 65 - 99 mg/dL     BUN 16 6 - 20 mg/dL   Creatinine, Ser 1.78 (H) 0.44 - 1.00 mg/dL   Calcium 8.6 (L) 8.9 - 10.3 mg/dL   Total Protein 5.3 (L) 6.5 - 8.1 g/dL   Albumin 2.0 (L) 3.5 - 5.0 g/dL   AST 44 (H) 15 - 41 U/L   ALT 30 14 - 54 U/L   Alkaline Phosphatase 107 38 - 126 U/L   Total Bilirubin 0.4 0.3 - 1.2 mg/dL   GFR calc non Af Amer 27 (L) >60 mL/min   GFR calc Af Amer 31 (L) >60 mL/min    Comment: (NOTE) The eGFR has been calculated using the CKD EPI equation. This calculation has not been validated in all clinical situations. eGFR's persistently <60 mL/min signify possible Chronic Kidney Disease.    Anion gap 13 5 - 15  CBC     Status: Abnormal   Collection Time: 09/06/15  4:23 AM  Result Value Ref Range   WBC 8.3 4.0 - 10.5 K/uL   RBC 3.52 (L) 3.87 - 5.11 MIL/uL   Hemoglobin 9.5 (L) 12.0 - 15.0 g/dL   HCT 30.6 (L) 36.0 - 46.0 %   MCV 86.9 78.0 - 100.0 fL   MCH 27.0 26.0 - 34.0 pg   MCHC 31.0 30.0 - 36.0 g/dL   RDW 14.5 11.5 - 15.5 %   Platelets 222 150 - 400 K/uL  Vancomycin, random     Status: None   Collection Time: 09/06/15  4:23 AM  Result Value Ref Range   Vancomycin Rm 30 ug/mL    Comment:        Random Vancomycin therapeutic range is dependent on dosage and time of specimen collection. A peak range is 20.0-40.0 ug/mL A trough range is 5.0-15.0 ug/mL          Basic metabolic panel     Status: Abnormal   Collection Time: 09/06/15  4:27 PM  Result Value Ref Range   Sodium 137 135 - 145 mmol/L   Potassium 3.5 3.5 - 5.1 mmol/L   Chloride 101 101 - 111 mmol/L   CO2 25 22 - 32 mmol/L   Glucose, Bld 117 (H) 65 - 99 mg/dL   BUN 15 6 - 20 mg/dL   Creatinine, Ser 2.00 (H) 0.44 - 1.00 mg/dL   Calcium 8.4 (L) 8.9 - 10.3 mg/dL   GFR calc non Af Amer 23 (L) >60 mL/min   GFR calc Af Amer 27 (L) >60 mL/min    Comment: (NOTE) The eGFR has been calculated using the CKD EPI equation. This calculation has not been validated in all clinical situations. eGFR's persistently <60 mL/min  signify possible Chronic Kidney Disease.    Anion gap 11 5 - 15  Comprehensive metabolic panel     Status:  Abnormal   Collection Time: 09/07/15  4:35 AM  Result Value Ref Range   Sodium 141 135 - 145 mmol/L   Potassium 3.5 3.5 - 5.1 mmol/L   Chloride 106 101 - 111 mmol/L   CO2 26 22 - 32 mmol/L   Glucose, Bld 125 (H) 65 - 99 mg/dL   BUN 16 6 - 20 mg/dL   Creatinine, Ser 1.88 (H) 0.44 - 1.00 mg/dL   Calcium 8.6 (L) 8.9 - 10.3 mg/dL   Total Protein 5.9 (L) 6.5 - 8.1 g/dL   Albumin 2.1 (L) 3.5 - 5.0 g/dL   AST 27 15 - 41 U/L   ALT 24 14 - 54 U/L   Alkaline Phosphatase 112 38 - 126 U/L   Total Bilirubin 0.6 0.3 - 1.2 mg/dL   GFR calc non Af Amer 25 (L) >60 mL/min   GFR calc Af Amer 29 (L) >60 mL/min    Comment: (NOTE) The eGFR has been calculated using the CKD EPI equation. This calculation has not been validated in all clinical situations. eGFR's persistently <60 mL/min signify possible Chronic Kidney Disease.    Anion gap 9 5 - 15  CBC     Status: Abnormal   Collection Time: 09/07/15  4:35 AM  Result Value Ref Range   WBC 8.9 4.0 - 10.5 K/uL   RBC 3.57 (L) 3.87 - 5.11 MIL/uL   Hemoglobin 9.7 (L) 12.0 - 15.0 g/dL   HCT 31.1 (L) 36.0 - 46.0 %   MCV 87.1 78.0 - 100.0 fL   MCH 27.2 26.0 - 34.0 pg   MCHC 31.2 30.0 - 36.0 g/dL   RDW 14.6 11.5 - 15.5 %   Platelets 253 150 - 400 K/uL  Vancomycin, random     Status: None   Collection Time: 09/07/15  4:36 AM  Result Value Ref Range   Vancomycin Rm 17 ug/mL    Comment:        Random Vancomycin therapeutic range is dependent on dosage and time of specimen collection. A peak range is 20.0-40.0 ug/mL A trough range is 5.0-15.0 ug/mL            Dg Chest Port 1 View  09/07/2015  CLINICAL DATA:  Left pleural effusion and left lower lobe consolidation EXAM: PORTABLE CHEST 1 VIEW COMPARISON:  September 06, 2015 FINDINGS: Central catheter tip is in the superior vena cava. No pneumothorax. There is persistent left lower lobe  consolidation with left effusion. No new opacity. Heart is mildly enlarged with pulmonary vascular within normal limits, stable. No adenopathy evident. IMPRESSION: Persistent left lower lobe consolidation with left effusion. No new opacity. No change in cardiac silhouette. No pneumothorax. Electronically Signed   By: Lowella Grip III M.D.   On: 09/07/2015 07:49   Dg Chest Port 1 View  09/06/2015  CLINICAL DATA:  Coronary artery disease with recent coronary artery bypass grafting EXAM: PORTABLE CHEST 1 VIEW COMPARISON:  September 05, 2015 FINDINGS: There is persistent left lower lobe consolidation with left effusion. Right lung is clear. Heart is mildly enlarged with pulmonary vascularity within normal limits. Central catheter tip is in the superior vena cava. No pneumothorax. No adenopathy. IMPRESSION: Persistent left lower lobe consolidation with left effusion. No new opacity. No change in cardiac silhouette. No pneumothorax. Electronically Signed   By: Lowella Grip III M.D.   On: 09/06/2015 08:05    Review of Systems  Constitutional: Negative.   HENT: Negative.   Eyes: Negative for redness.  Respiratory: Negative.  Cardiovascular: Negative for palpitations.  Gastrointestinal: Negative for vomiting.  Genitourinary: Negative for frequency and hematuria.  Musculoskeletal: Negative for falls.  Skin: Negative.   Neurological: Negative for dizziness and tremors.  Psychiatric/Behavioral: Negative for depression. The patient is nervous/anxious.    Blood pressure 143/74, pulse 79, temperature 98.4 F (36.9 C), temperature source Oral, resp. rate 16, height _0  (1.778 m), weight 83.6 kg (184 lb 4.9 oz), SpO2 98 %. Physical Exam  Constitutional: She is oriented to person, place, and time. She appears well-developed and well-nourished.  HENT:  Head: Normocephalic and atraumatic.  Eyes: Conjunctivae and EOM are normal. Pupils are equal, round, and reactive to light.  Cardiovascular:  Normal rate.   Respiratory: Effort normal. No respiratory distress. She has no wheezes.    GI: Soft. She exhibits no distension. There is no tenderness.  Musculoskeletal: She exhibits no edema.  Neurological: She is alert and oriented to person, place, and time.  Skin: No rash noted. No erythema.  Psychiatric: She has a normal mood and affect. Her behavior is normal. Judgment and thought content normal.    Assessment/Plan: Plan for right pectoralis muscle flap to the chest / sternal wound with closure and likely VAC incisional dressing. Risks and complications were discussed and reviewed with the patient. She had the opportunity to ask questions.  Wallace Going 09/07/2015, 11:56 AM

## 2015-09-07 NOTE — Progress Notes (Signed)
      [address]       [place] [address]             [phone]      Feels well this evening  Issues with VAC earlier RN was able to fix  BP 148/70 mmHg  Pulse 79  Temp(Src) 98.1 F (36.7 C) (Oral)  Resp 20  Ht 5\' 10"  (1.778 m)  Wt 184 lb 4.9 oz (83.6 kg)  BMI 26.44 kg/m2  SpO2 97%   Intake/Output Summary (Last 24 hours) at 09/07/15 1801 Last data filed at 09/07/15 1600  Gross per 24 hour  Intake   1000 ml  Output   1616 ml  Net   -616 ml   Good UO so far today  For flap tomorrow  Remo Lipps C. Roxan Hockey, MD [facility] [phone]

## 2015-09-07 NOTE — Progress Notes (Signed)
Pharmacy Antibiotic Note  [name] [name] is a 75 y.o. female admitted on 08/31/2015 with sternal wound infection.  Pharmacy has been consulted for vancomycin dosing. Currently day 8 of broad spectrum abx with vanc and Zosyn.  Vancomycin random level 17 this AM. Last dose on 2/10 at 0630.  Plan: Resume vancomycin at 1000mg  IV Q48H and continue to monitor closely as SCr has been rapidly rising.  Height: 5\' 10"  (177.8 cm) Weight: 184 lb 4.9 oz (83.6 kg) IBW/kg (Calculated) : 68.5  Temp (24hrs), Avg:98.1 F (36.7 C), Min:97.7 F (36.5 C), Max:98.5 F (36.9 C)   Recent Labs Lab 08/31/15 1558  09/02/15 1810 09/03/15 0526 09/04/15 0540 09/04/15 1557 09/05/15 0430 09/05/15 1850 09/06/15 0423 09/06/15 1627 09/07/15 0435 09/07/15 0436  WBC  --   < >  --  7.4 7.6 8.7 7.9  --  8.3  --  8.9  --   CREATININE  --   < >  --  0.95 0.85  --  1.18*  --  1.78* 2.00*  --   --   LATICACIDVEN 1.40  --   --   --   --   --   --   --   --   --   --   --   VANCOTROUGH  --   --  17  --   --   --   --  82*  --   --   --   --   VANCORANDOM  --   --   --   --   --   --   --   --  30  --   --  17  < > = values in this interval not displayed.  Estimated Creatinine Clearance: 29 mL/min (by C-G formula based on Cr of 2).    Allergies  Allergen Reactions  . Other Hives and Itching    Food Dye Red Dye #40  . Peanut-Containing Drug Products Rash    Mild rash    Antimicrobials this admission: 2/5 vancomycin >>  2/6 Zosyn >>   Dose adjustments this admission: 2/7 vanc trough 17, no adjustments made 2/10 vanc trough 35 (dose held)  2/11 random vanc level 30 2/12 random vanc level 17, resume vanc 1g Q48H    Microbiology results: 2/5 BCx: no growth final 2/6 UCx: no growth final  2/6 Sternal wound: no growth final 2/6 anaerobic cx>> no growth x 5 days   Thank you for allowing pharmacy to be a part of this patient's care.   Wynona Neat, PharmD, BCPS   09/07/2015 5:18 AM

## 2015-09-07 NOTE — Progress Notes (Signed)
Blood bank called, 2 units of blood ready for surgery tomorrow

## 2015-09-08 ENCOUNTER — Inpatient Hospital Stay (HOSPITAL_COMMUNITY): Payer: Medicare PPO

## 2015-09-08 ENCOUNTER — Inpatient Hospital Stay (HOSPITAL_COMMUNITY): Payer: Medicare PPO | Admitting: Anesthesiology

## 2015-09-08 ENCOUNTER — Encounter (HOSPITAL_COMMUNITY)
Admission: EM | Disposition: A | Discharge status: Home Health | Payer: Self-pay | Source: Home / Self Care | Attending: Cardiothoracic Surgery

## 2015-09-08 ENCOUNTER — Encounter (HOSPITAL_COMMUNITY): Payer: Self-pay | Admitting: Plastic Surgery

## 2015-09-08 HISTORY — PX: PECTORALIS FLAP: SHX6228

## 2015-09-08 HISTORY — PX: CHEST TUBE INSERTION: SHX231

## 2015-09-08 HISTORY — PX: STERNAL WOUND DEBRIDEMENT: SHX1058

## 2015-09-08 LAB — CBC
HCT: 29.3 % — ABNORMAL LOW (ref 36.0–46.0)
HCT: 29.9 % — ABNORMAL LOW (ref 36.0–46.0)
Hemoglobin: 9 g/dL — ABNORMAL LOW (ref 12.0–15.0)
Hemoglobin: 9.7 g/dL — ABNORMAL LOW (ref 12.0–15.0)
MCH: 26.9 pg (ref 26.0–34.0)
MCH: 28.8 pg (ref 26.0–34.0)
MCHC: 30.7 g/dL (ref 30.0–36.0)
MCHC: 32.4 g/dL (ref 30.0–36.0)
MCV: 87.7 fL (ref 78.0–100.0)
MCV: 88.7 fL (ref 78.0–100.0)
Platelets: 229 10*3/uL (ref 150–400)
Platelets: 264 10*3/uL (ref 150–400)
RBC: 3.34 MIL/uL — ABNORMAL LOW (ref 3.87–5.11)
RBC: 3.37 MIL/uL — ABNORMAL LOW (ref 3.87–5.11)
RDW: 14.8 % (ref 11.5–15.5)
RDW: 14.8 % (ref 11.5–15.5)
WBC: 9.3 10*3/uL (ref 4.0–10.5)
WBC: 15.8 10*3/uL — ABNORMAL HIGH (ref 4.0–10.5)

## 2015-09-08 LAB — BASIC METABOLIC PANEL
Anion gap: 10 (ref 5–15)
BUN: 12 mg/dL (ref 6–20)
CO2: 25 mmol/L (ref 22–32)
Calcium: 8.4 mg/dL — ABNORMAL LOW (ref 8.9–10.3)
Chloride: 108 mmol/L (ref 101–111)
Creatinine, Ser: 1.4 mg/dL — ABNORMAL HIGH (ref 0.44–1.00)
GFR calc Af Amer: 42 mL/min — ABNORMAL LOW (ref >60)
GFR calc non Af Amer: 36 mL/min — ABNORMAL LOW (ref >60)
Glucose, Bld: 121 mg/dL — ABNORMAL HIGH (ref 65–99)
Potassium: 3.8 mmol/L (ref 3.5–5.1)
Sodium: 143 mmol/L (ref 135–145)

## 2015-09-08 SURGERY — DEBRIDEMENT, WOUND, STERNUM
Anesthesia: General | Site: Chest

## 2015-09-08 MED ORDER — LIDOCAINE HCL (CARDIAC) 20 MG/ML IV SOLN
INTRAVENOUS | Status: DC | PRN
  Administered 2015-09-08: 100 mg via INTRAVENOUS
  Administered 2015-09-08: 60 mg via INTRATRACHEAL

## 2015-09-08 MED ORDER — KCL IN DEXTROSE-NACL 10-5-0.45 MEQ/L-%-% IV SOLN
INTRAVENOUS | Status: DC
  Administered 2015-09-08: 14:00:00 via INTRAVENOUS
  Filled 2015-09-08 (×3): qty 1000

## 2015-09-08 MED ORDER — LACTATED RINGERS IV SOLN
INTRAVENOUS | Status: DC | PRN
  Administered 2015-09-08 (×2): via INTRAVENOUS

## 2015-09-08 MED ORDER — 0.9 % SODIUM CHLORIDE (POUR BTL) OPTIME
TOPICAL | Status: DC | PRN
  Administered 2015-09-08: 2000 mL

## 2015-09-08 MED ORDER — LIDOCAINE-EPINEPHRINE (PF) 1 %-1:200000 IJ SOLN: INTRAMUSCULAR | Status: DC | PRN

## 2015-09-08 MED ORDER — FENTANYL CITRATE (PF) 250 MCG/5ML IJ SOLN
INTRAMUSCULAR | Status: AC
  Filled 2015-09-08: qty 5

## 2015-09-08 MED ORDER — SUCCINYLCHOLINE CHLORIDE 20 MG/ML IJ SOLN
INTRAMUSCULAR | Status: AC
  Filled 2015-09-08: qty 1

## 2015-09-08 MED ORDER — EPHEDRINE SULFATE 50 MG/ML IJ SOLN
INTRAMUSCULAR | Status: AC
  Filled 2015-09-08: qty 1

## 2015-09-08 MED ORDER — NEOSTIGMINE METHYLSULFATE 10 MG/10ML IV SOLN
INTRAVENOUS | Status: DC | PRN
  Administered 2015-09-08: 4 mg via INTRAVENOUS

## 2015-09-08 MED ORDER — ARTIFICIAL TEARS OP OINT
TOPICAL_OINTMENT | OPHTHALMIC | Status: AC
  Filled 2015-09-08: qty 3.5

## 2015-09-08 MED ORDER — ASPIRIN EC 325 MG PO TBEC
325.0000 mg | DELAYED_RELEASE_TABLET | Freq: Every day | ORAL | Status: DC
  Administered 2015-09-09 – 2015-09-14 (×6): 325 mg via ORAL
  Filled 2015-09-08 (×6): qty 1

## 2015-09-08 MED ORDER — SODIUM CHLORIDE 0.9 % IR SOLN
Status: DC | PRN
  Administered 2015-09-08: 500 mL

## 2015-09-08 MED ORDER — ROCURONIUM BROMIDE 50 MG/5ML IV SOLN
INTRAVENOUS | Status: AC
  Filled 2015-09-08: qty 1

## 2015-09-08 MED ORDER — CEFUROXIME SODIUM 1.5 G IJ SOLR
1.5000 g | Freq: Two times a day (BID) | INTRAMUSCULAR | Status: AC
  Administered 2015-09-08 – 2015-09-09 (×3): 1.5 g via INTRAVENOUS
  Filled 2015-09-08 (×3): qty 1.5

## 2015-09-08 MED ORDER — PROPOFOL 10 MG/ML IV BOLUS
INTRAVENOUS | Status: AC
  Filled 2015-09-08: qty 20

## 2015-09-08 MED ORDER — ONDANSETRON HCL 4 MG/2ML IJ SOLN
INTRAMUSCULAR | Status: AC
  Filled 2015-09-08: qty 2

## 2015-09-08 MED ORDER — PROPOFOL 10 MG/ML IV BOLUS
INTRAVENOUS | Status: DC | PRN
  Administered 2015-09-08: 100 mg via INTRAVENOUS
  Administered 2015-09-08: 30 mg via INTRAVENOUS
  Administered 2015-09-08: 20 mg via INTRAVENOUS

## 2015-09-08 MED ORDER — ONDANSETRON HCL 4 MG/2ML IJ SOLN
INTRAMUSCULAR | Status: DC | PRN
  Administered 2015-09-08: 4 mg via INTRAVENOUS

## 2015-09-08 MED ORDER — HYDROMORPHONE HCL 1 MG/ML IJ SOLN
0.2500 mg | INTRAMUSCULAR | Status: DC | PRN
  Administered 2015-09-08 (×2): 0.5 mg via INTRAVENOUS

## 2015-09-08 MED ORDER — STERILE WATER FOR INJECTION IJ SOLN
INTRAMUSCULAR | Status: AC
  Filled 2015-09-08: qty 10

## 2015-09-08 MED ORDER — LIDOCAINE HCL (CARDIAC) 20 MG/ML IV SOLN
INTRAVENOUS | Status: AC
  Filled 2015-09-08: qty 10

## 2015-09-08 MED ORDER — ACETAMINOPHEN 500 MG PO TABS
1000.0000 mg | ORAL_TABLET | Freq: Four times a day (QID) | ORAL | Status: AC
  Administered 2015-09-08 – 2015-09-13 (×17): 1000 mg via ORAL
  Filled 2015-09-08 (×18): qty 2

## 2015-09-08 MED ORDER — PROMETHAZINE HCL 25 MG/ML IJ SOLN: 6.2500 mg | INTRAMUSCULAR | Status: DC | PRN

## 2015-09-08 MED ORDER — DEXTROSE 5 % IV SOLN
10.0000 mg | INTRAVENOUS | Status: DC | PRN
  Administered 2015-09-08: 25 ug/min via INTRAVENOUS

## 2015-09-08 MED ORDER — GLYCOPYRROLATE 0.2 MG/ML IJ SOLN
INTRAMUSCULAR | Status: DC | PRN
  Administered 2015-09-08: 0.6 mg via INTRAVENOUS

## 2015-09-08 MED ORDER — METOPROLOL TARTRATE 50 MG PO TABS
50.0000 mg | ORAL_TABLET | Freq: Two times a day (BID) | ORAL | Status: DC
  Administered 2015-09-08 – 2015-09-14 (×12): 50 mg via ORAL
  Filled 2015-09-08 (×12): qty 1

## 2015-09-08 MED ORDER — BUPIVACAINE-EPINEPHRINE (PF) 0.25% -1:200000 IJ SOLN
INTRAMUSCULAR | Status: AC
  Filled 2015-09-08: qty 30

## 2015-09-08 MED ORDER — MIDAZOLAM HCL 2 MG/2ML IJ SOLN
INTRAMUSCULAR | Status: AC
  Filled 2015-09-08: qty 2

## 2015-09-08 MED ORDER — GLYCOPYRROLATE 0.2 MG/ML IJ SOLN
INTRAMUSCULAR | Status: AC
  Filled 2015-09-08: qty 3

## 2015-09-08 MED ORDER — FENTANYL CITRATE (PF) 100 MCG/2ML IJ SOLN
INTRAMUSCULAR | Status: DC | PRN
  Administered 2015-09-08 (×8): 50 ug via INTRAVENOUS

## 2015-09-08 MED ORDER — ACETAMINOPHEN 160 MG/5ML PO SOLN: 1000.0000 mg | Freq: Four times a day (QID) | ORAL | Status: AC

## 2015-09-08 MED ORDER — MIDAZOLAM HCL 5 MG/5ML IJ SOLN
INTRAMUSCULAR | Status: DC | PRN
  Administered 2015-09-08: 2 mg via INTRAVENOUS

## 2015-09-08 MED ORDER — NEOSTIGMINE METHYLSULFATE 10 MG/10ML IV SOLN
INTRAVENOUS | Status: AC
  Filled 2015-09-08: qty 1

## 2015-09-08 MED ORDER — LIDOCAINE HCL (PF) 1 % IJ SOLN
INTRAMUSCULAR | Status: AC
  Filled 2015-09-08: qty 30

## 2015-09-08 MED ORDER — HYDROMORPHONE HCL 1 MG/ML IJ SOLN
INTRAMUSCULAR | Status: AC
  Administered 2015-09-08: 0.5 mg via INTRAVENOUS
  Filled 2015-09-08: qty 1

## 2015-09-08 MED ORDER — ROCURONIUM BROMIDE 100 MG/10ML IV SOLN
INTRAVENOUS | Status: DC | PRN
  Administered 2015-09-08: 20 mg via INTRAVENOUS
  Administered 2015-09-08: 50 mg via INTRAVENOUS

## 2015-09-08 SURGICAL SUPPLY — 108 items
APPLIER CLIP 9.375 MED OPEN (MISCELLANEOUS)
ATCH SMKEVC FLXB CAUT HNDSWH (FILTER) IMPLANT
ATTRACTOMAT 16X20 MAGNETIC DRP (DRAPES) IMPLANT
BAG DECANTER FOR FLEXI CONT (MISCELLANEOUS) ×8 IMPLANT
BENZOIN TINCTURE PRP APPL 2/3 (GAUZE/BANDAGES/DRESSINGS) ×4 IMPLANT
BINDER BREAST XLRG (GAUZE/BANDAGES/DRESSINGS) ×4 IMPLANT
BIOPATCH RED 1 DISK 7.0 (GAUZE/BANDAGES/DRESSINGS) ×6 IMPLANT
BIOPATCH RED 1IN DISK 7.0MM (GAUZE/BANDAGES/DRESSINGS) ×2
BLADE 10 SAFETY STRL DISP (BLADE) IMPLANT
BLADE SURG 10 STRL SS (BLADE) IMPLANT
BLADE SURG 15 STRL LF DISP TIS (BLADE) IMPLANT
BLADE SURG 15 STRL SS (BLADE)
BNDG GAUZE ELAST 4 BULKY (GAUZE/BANDAGES/DRESSINGS) IMPLANT
CANISTER SUCTION 2500CC (MISCELLANEOUS) ×8 IMPLANT
CANISTER WOUND CARE 500ML ATS (WOUND CARE) ×4 IMPLANT
CATH FOLEY 2WAY SLVR  5CC 16FR (CATHETERS)
CATH FOLEY 2WAY SLVR 5CC 16FR (CATHETERS) IMPLANT
CATH THORACIC 28FR (CATHETERS) ×4 IMPLANT
CATH THORACIC 28FR RT ANG (CATHETERS) IMPLANT
CATH THORACIC 36FR (CATHETERS) IMPLANT
CATH THORACIC 36FR RT ANG (CATHETERS) IMPLANT
CHLORAPREP W/TINT 26ML (MISCELLANEOUS) IMPLANT
CLIP APPLIE 9.375 MED OPEN (MISCELLANEOUS) IMPLANT
CLIP TI MEDIUM 24 (CLIP) ×4 IMPLANT
CLIP TI WIDE RED SMALL 24 (CLIP) ×4 IMPLANT
CLOSURE WOUND 1/2 X4 (GAUZE/BANDAGES/DRESSINGS)
CONN Y 3/8X3/8X3/8  BEN (MISCELLANEOUS)
CONN Y 3/8X3/8X3/8 BEN (MISCELLANEOUS) IMPLANT
CONT SPEC 4OZ CLIKSEAL STRL BL (MISCELLANEOUS) IMPLANT
COVER SURGICAL LIGHT HANDLE (MISCELLANEOUS) IMPLANT
DRAIN CHANNEL 19F RND (DRAIN) ×8 IMPLANT
DRAPE CV SPLIT W-CLR ANES SCRN (DRAPES) ×4 IMPLANT
DRAPE INCISE 23X17 IOBAN STRL (DRAPES)
DRAPE INCISE IOBAN 23X17 STRL (DRAPES) IMPLANT
DRAPE LAPAROSCOPIC ABDOMINAL (DRAPES) IMPLANT
DRAPE ORTHO SPLIT 77X108 STRL (DRAPES)
DRAPE PROXIMA HALF (DRAPES) ×8 IMPLANT
DRAPE SURG 17X23 STRL (DRAPES) IMPLANT
DRAPE SURG ORHT 6 SPLT 77X108 (DRAPES) IMPLANT
DRAPE WARM FLUID 44X44 (DRAPE) IMPLANT
DRESSING HYDROCOLLOID 4X4 (GAUZE/BANDAGES/DRESSINGS) ×4 IMPLANT
DRSG AQUACEL AG ADV 3.5X14 (GAUZE/BANDAGES/DRESSINGS) IMPLANT
DRSG PAD ABDOMINAL 8X10 ST (GAUZE/BANDAGES/DRESSINGS) ×8 IMPLANT
DRSG VAC ATS MED SENSATRAC (GAUZE/BANDAGES/DRESSINGS) ×4 IMPLANT
ELECT BLADE 4.0 EZ CLEAN MEGAD (MISCELLANEOUS) ×4
ELECT BLADE 6.5 EXT (BLADE) ×4 IMPLANT
ELECT CAUTERY BLADE 6.4 (BLADE) ×4 IMPLANT
ELECT REM PT RETURN 9FT ADLT (ELECTROSURGICAL) ×4
ELECTRODE BLDE 4.0 EZ CLN MEGD (MISCELLANEOUS) ×2 IMPLANT
ELECTRODE REM PT RTRN 9FT ADLT (ELECTROSURGICAL) ×2 IMPLANT
EVACUATOR SILICONE 100CC (DRAIN) IMPLANT
EVACUATOR SMOKE ACCUVAC VALLEY (FILTER)
GAUZE SPONGE 4X4 12PLY STRL (GAUZE/BANDAGES/DRESSINGS) IMPLANT
GAUZE XEROFORM 5X9 LF (GAUZE/BANDAGES/DRESSINGS) IMPLANT
GLOVE BIO SURGEON STRL SZ 6.5 (GLOVE) ×12 IMPLANT
GLOVE BIO SURGEON STRL SZ7.5 (GLOVE) ×16 IMPLANT
GLOVE BIO SURGEONS STRL SZ 6.5 (GLOVE) ×4
GOWN STRL REUS W/ TWL LRG LVL3 (GOWN DISPOSABLE) ×12 IMPLANT
GOWN STRL REUS W/TWL LRG LVL3 (GOWN DISPOSABLE) ×12
HANDPIECE INTERPULSE COAX TIP (DISPOSABLE) ×2
HEMOSTAT POWDER SURGIFOAM 1G (HEMOSTASIS) IMPLANT
HEMOSTAT SURGICEL 2X14 (HEMOSTASIS) IMPLANT
ILLUMINATOR WAVEGUIDE N/F (MISCELLANEOUS) ×4 IMPLANT
KIT BASIN OR (CUSTOM PROCEDURE TRAY) ×4 IMPLANT
KIT ROOM TURNOVER OR (KITS) ×4 IMPLANT
KIT SUCTION CATH 14FR (SUCTIONS) IMPLANT
MARKER SKIN DUAL TIP RULER LAB (MISCELLANEOUS) IMPLANT
NS IRRIG 1000ML POUR BTL (IV SOLUTION) ×12 IMPLANT
PACK CHEST (CUSTOM PROCEDURE TRAY) ×4 IMPLANT
PACK GENERAL/GYN (CUSTOM PROCEDURE TRAY) IMPLANT
PAD ARMBOARD 7.5X6 YLW CONV (MISCELLANEOUS) ×16 IMPLANT
PENCIL BUTTON HOLSTER BLD 10FT (ELECTRODE) IMPLANT
PREFILTER EVAC NS 1 1/3-3/8IN (MISCELLANEOUS) IMPLANT
SET HNDPC FAN SPRY TIP SCT (DISPOSABLE) ×2 IMPLANT
SOLUTION BETADINE 4OZ (MISCELLANEOUS) ×4 IMPLANT
SPONGE LAP 18X18 X RAY DECT (DISPOSABLE) ×8 IMPLANT
STAPLER VISISTAT 35W (STAPLE) IMPLANT
STRAP MONTGOMERY 1.25X11-1/8 (MISCELLANEOUS) IMPLANT
STRIP CLOSURE SKIN 1/2X4 (GAUZE/BANDAGES/DRESSINGS) IMPLANT
SUT ETHILON 3 0 FSL (SUTURE) IMPLANT
SUT MNCRL AB 3-0 PS2 18 (SUTURE) ×24 IMPLANT
SUT MNCRL AB 4-0 PS2 18 (SUTURE) ×8 IMPLANT
SUT MON AB 2-0 CT1 36 (SUTURE) ×8 IMPLANT
SUT MON AB 5-0 PS2 18 (SUTURE) ×12 IMPLANT
SUT PDS AB 0 CT 36 (SUTURE) IMPLANT
SUT PROLENE 3 0 PS 1 (SUTURE) IMPLANT
SUT SILK 2 0 SH CR/8 (SUTURE) ×4 IMPLANT
SUT STEEL 6MS V (SUTURE) IMPLANT
SUT STEEL STERNAL CCS#1 18IN (SUTURE) IMPLANT
SUT STEEL SZ 6 DBL 3X14 BALL (SUTURE) IMPLANT
SUT VIC AB 1 CTX 36 (SUTURE)
SUT VIC AB 1 CTX36XBRD ANBCTR (SUTURE) IMPLANT
SUT VIC AB 2-0 CTX 27 (SUTURE) IMPLANT
SUT VIC AB 3-0 FS2 27 (SUTURE) IMPLANT
SUT VIC AB 3-0 SH 8-18 (SUTURE) IMPLANT
SUT VIC AB 3-0 X1 27 (SUTURE) ×4 IMPLANT
SWAB COLLECTION DEVICE MRSA (MISCELLANEOUS) IMPLANT
SYR 50ML SLIP (SYRINGE) IMPLANT
SYR 5ML LL (SYRINGE) IMPLANT
SYR BULB IRRIGATION 50ML (SYRINGE) IMPLANT
SYR CONTROL 10ML LL (SYRINGE) ×4 IMPLANT
TOWEL OR 17X24 6PK STRL BLUE (TOWEL DISPOSABLE) ×8 IMPLANT
TOWEL OR 17X26 10 PK STRL BLUE (TOWEL DISPOSABLE) ×8 IMPLANT
TRAY FOLEY CATH 14FRSI W/METER (CATHETERS) IMPLANT
TUBE ANAEROBIC SPECIMEN COL (MISCELLANEOUS) IMPLANT
TUBE CONNECTING 12'X1/4 (SUCTIONS)
TUBE CONNECTING 12X1/4 (SUCTIONS) IMPLANT
WATER STERILE IRR 1000ML POUR (IV SOLUTION) ×8 IMPLANT

## 2015-09-08 NOTE — Progress Notes (Signed)
CT surgery p.m. Rounds  Patient resting comfortably after pectoralis muscle flap Minimal drainage from JP drains Heart rate sinus, blood pressure stable Left chest tube placed in OR for postop pleural effusion with currently minimal drainage Patient's pain well-controlled We'll mobilize out of bed tomorrow, advance diet

## 2015-09-08 NOTE — H&P (View-Only) (Signed)
Reason for Consult: Sternal wound Referring Physician: Dahlia Byes  Consult done intraoperatively on 09/05/2015  [name] [name] is an 75 y.o. female.  HPI: The patient is a 75 yrs old bf here after coronary artery bypass graft x 3 on 08/14/2015.  The left internal mammary artery to LAD, saphenous vein graft to ramus intermediate and saphenous vein graft to posterior descending was done.  She had an endoscopic harvest of the right leg greater saphenous vein.  She present a few weeks later with concerns of a retrosternal abscess and sternal wound dehiscence.  She was taken to the OR for debridement last on 2/10 when I was consulted intraoperatively.  The area was clean and was debrided well.  There was no further sign of abscess or infection.  Acell powder was placed to start the promotion of granulation tissue.  The VAC was placed over a sorbac sheet.  The patient was then seen by me on 2/11 to further exam, introduce and explain the surgery.  The vac was in place with an excellent seal and the breast binder in place.   Past Medical History  Diagnosis Date  . Hypertension   . Anxiety about health 08/28/2015    Past Surgical History  Procedure Laterality Date  . Back surgery    . Abdominal hysterectomy    . Appendectomy    . Colonoscopy N/A 03/29/2013    Procedure: COLONOSCOPY;  Surgeon: Rogene Houston, MD;  [facility];  Service: Endoscopy;  Laterality: N/A;  930  . Cardiac catheterization N/A 08/10/2015    Procedure: Left Heart Cath and Coronary Angiography;  Surgeon: [name], MD;  Location: [place] [facility];  Service: Cardiovascular;  Laterality: N/A;  . Cardiac catheterization  08/10/2015    Procedure: Coronary Balloon Angioplasty;  Surgeon: [name], MD;  Location: [place] [facility];  Service: Cardiovascular;;  . Coronary artery bypass graft N/A 08/14/2015    Procedure: CORONARY ARTERY BYPASS GRAFTING (CABG)X3 LIMA-LAD; SVG-RAMUS; SVG-PD TRANSESOPHAGEAL  ECHOCARDIOGRAM (TEE);  Surgeon: [name], MD;  Location: [place];  Service: Open Heart Surgery;  Laterality: N/A;  . Tee without cardioversion N/A 08/14/2015    Procedure: TRANSESOPHAGEAL ECHOCARDIOGRAM (TEE);  Surgeon: [name], MD;  Location: [place];  Service: Open Heart Surgery;  Laterality: N/A;  . Sternal wound debridement N/A 09/01/2015    Procedure: STERNAL WOUND DEBRIDEMENT;  Surgeon: [name], MD;  Location: [place];  Service: Thoracic;  Laterality: N/A;  . Sternal wound debridement N/A 09/04/2015    Procedure: STERNAL WOUND DEBRIDEMENT;  Surgeon: [name], MD;  Location: [place];  Service: Thoracic;  Laterality: N/A;  . Application of wound vac N/A 09/04/2015    Procedure: WOUND VAC CHANGE;  Surgeon: [name], MD;  Location: [facility] OR;  Service: Thoracic;  Laterality: N/A;    Family History  Problem Relation Age of Onset  . Colon cancer Neg Hx     Social History:  reports that she has never smoked. She does not have any smokeless tobacco history on file. She reports that she does not drink alcohol or use illicit drugs.  Allergies:  Allergies  Allergen Reactions  . Other Hives and Itching    Food Dye Red Dye #40  . Peanut-Containing Drug Products Rash    Mild rash    Medications: I have reviewed the patient's current medications.  Results for orders placed or performed during the hospital encounter of 08/31/15 (from the past  48 hour(s))  Vancomycin, trough     Status: Abnormal   Collection Time: 09/05/15  6:50 PM  Result Value Ref Range   Vancomycin Tr 35 (HH) 10.0 - 20.0 ug/mL    Comment: CRITICAL RESULT CALLED TO, READ BACK BY AND VERIFIED WITH: J PERRIN,RN 1942 09/05/2015 WBOND   Comprehensive metabolic panel     Status: Abnormal   Collection Time: 09/06/15  4:23 AM  Result Value Ref Range   Sodium 140 135 - 145 mmol/L   Potassium 3.8 3.5 - 5.1 mmol/L   Chloride 101 101 - 111 mmol/L   CO2 26 22 - 32 mmol/L   Glucose, Bld 127 (H) 65 - 99 mg/dL     BUN 16 6 - 20 mg/dL   Creatinine, Ser 1.78 (H) 0.44 - 1.00 mg/dL   Calcium 8.6 (L) 8.9 - 10.3 mg/dL   Total Protein 5.3 (L) 6.5 - 8.1 g/dL   Albumin 2.0 (L) 3.5 - 5.0 g/dL   AST 44 (H) 15 - 41 U/L   ALT 30 14 - 54 U/L   Alkaline Phosphatase 107 38 - 126 U/L   Total Bilirubin 0.4 0.3 - 1.2 mg/dL   GFR calc non Af Amer 27 (L) >60 mL/min   GFR calc Af Amer 31 (L) >60 mL/min    Comment: (NOTE) The eGFR has been calculated using the CKD EPI equation. This calculation has not been validated in all clinical situations. eGFR's persistently <60 mL/min signify possible Chronic Kidney Disease.    Anion gap 13 5 - 15  CBC     Status: Abnormal   Collection Time: 09/06/15  4:23 AM  Result Value Ref Range   WBC 8.3 4.0 - 10.5 K/uL   RBC 3.52 (L) 3.87 - 5.11 MIL/uL   Hemoglobin 9.5 (L) 12.0 - 15.0 g/dL   HCT 30.6 (L) 36.0 - 46.0 %   MCV 86.9 78.0 - 100.0 fL   MCH 27.0 26.0 - 34.0 pg   MCHC 31.0 30.0 - 36.0 g/dL   RDW 14.5 11.5 - 15.5 %   Platelets 222 150 - 400 K/uL  Vancomycin, random     Status: None   Collection Time: 09/06/15  4:23 AM  Result Value Ref Range   Vancomycin Rm 30 ug/mL    Comment:        Random Vancomycin therapeutic range is dependent on dosage and time of specimen collection. A peak range is 20.0-40.0 ug/mL A trough range is 5.0-15.0 ug/mL          Basic metabolic panel     Status: Abnormal   Collection Time: 09/06/15  4:27 PM  Result Value Ref Range   Sodium 137 135 - 145 mmol/L   Potassium 3.5 3.5 - 5.1 mmol/L   Chloride 101 101 - 111 mmol/L   CO2 25 22 - 32 mmol/L   Glucose, Bld 117 (H) 65 - 99 mg/dL   BUN 15 6 - 20 mg/dL   Creatinine, Ser 2.00 (H) 0.44 - 1.00 mg/dL   Calcium 8.4 (L) 8.9 - 10.3 mg/dL   GFR calc non Af Amer 23 (L) >60 mL/min   GFR calc Af Amer 27 (L) >60 mL/min    Comment: (NOTE) The eGFR has been calculated using the CKD EPI equation. This calculation has not been validated in all clinical situations. eGFR's persistently <60 mL/min  signify possible Chronic Kidney Disease.    Anion gap 11 5 - 15  Comprehensive metabolic panel     Status:  Abnormal   Collection Time: 09/07/15  4:35 AM  Result Value Ref Range   Sodium 141 135 - 145 mmol/L   Potassium 3.5 3.5 - 5.1 mmol/L   Chloride 106 101 - 111 mmol/L   CO2 26 22 - 32 mmol/L   Glucose, Bld 125 (H) 65 - 99 mg/dL   BUN 16 6 - 20 mg/dL   Creatinine, Ser 1.88 (H) 0.44 - 1.00 mg/dL   Calcium 8.6 (L) 8.9 - 10.3 mg/dL   Total Protein 5.9 (L) 6.5 - 8.1 g/dL   Albumin 2.1 (L) 3.5 - 5.0 g/dL   AST 27 15 - 41 U/L   ALT 24 14 - 54 U/L   Alkaline Phosphatase 112 38 - 126 U/L   Total Bilirubin 0.6 0.3 - 1.2 mg/dL   GFR calc non Af Amer 25 (L) >60 mL/min   GFR calc Af Amer 29 (L) >60 mL/min    Comment: (NOTE) The eGFR has been calculated using the CKD EPI equation. This calculation has not been validated in all clinical situations. eGFR's persistently <60 mL/min signify possible Chronic Kidney Disease.    Anion gap 9 5 - 15  CBC     Status: Abnormal   Collection Time: 09/07/15  4:35 AM  Result Value Ref Range   WBC 8.9 4.0 - 10.5 K/uL   RBC 3.57 (L) 3.87 - 5.11 MIL/uL   Hemoglobin 9.7 (L) 12.0 - 15.0 g/dL   HCT 31.1 (L) 36.0 - 46.0 %   MCV 87.1 78.0 - 100.0 fL   MCH 27.2 26.0 - 34.0 pg   MCHC 31.2 30.0 - 36.0 g/dL   RDW 14.6 11.5 - 15.5 %   Platelets 253 150 - 400 K/uL  Vancomycin, random     Status: None   Collection Time: 09/07/15  4:36 AM  Result Value Ref Range   Vancomycin Rm 17 ug/mL    Comment:        Random Vancomycin therapeutic range is dependent on dosage and time of specimen collection. A peak range is 20.0-40.0 ug/mL A trough range is 5.0-15.0 ug/mL            Dg Chest Port 1 View  09/07/2015  CLINICAL DATA:  Left pleural effusion and left lower lobe consolidation EXAM: PORTABLE CHEST 1 VIEW COMPARISON:  September 06, 2015 FINDINGS: Central catheter tip is in the superior vena cava. No pneumothorax. There is persistent left lower lobe  consolidation with left effusion. No new opacity. Heart is mildly enlarged with pulmonary vascular within normal limits, stable. No adenopathy evident. IMPRESSION: Persistent left lower lobe consolidation with left effusion. No new opacity. No change in cardiac silhouette. No pneumothorax. Electronically Signed   By: Lowella Grip III M.D.   On: 09/07/2015 07:49   Dg Chest Port 1 View  09/06/2015  CLINICAL DATA:  Coronary artery disease with recent coronary artery bypass grafting EXAM: PORTABLE CHEST 1 VIEW COMPARISON:  September 05, 2015 FINDINGS: There is persistent left lower lobe consolidation with left effusion. Right lung is clear. Heart is mildly enlarged with pulmonary vascularity within normal limits. Central catheter tip is in the superior vena cava. No pneumothorax. No adenopathy. IMPRESSION: Persistent left lower lobe consolidation with left effusion. No new opacity. No change in cardiac silhouette. No pneumothorax. Electronically Signed   By: Lowella Grip III M.D.   On: 09/06/2015 08:05    Review of Systems  Constitutional: Negative.   HENT: Negative.   Eyes: Negative for redness.  Respiratory: Negative.  Cardiovascular: Negative for palpitations.  Gastrointestinal: Negative for vomiting.  Genitourinary: Negative for frequency and hematuria.  Musculoskeletal: Negative for falls.  Skin: Negative.   Neurological: Negative for dizziness and tremors.  Psychiatric/Behavioral: Negative for depression. The patient is nervous/anxious.    Blood pressure 143/74, pulse 79, temperature 98.4 F (36.9 C), temperature source Oral, resp. rate 16, height _0  (1.778 m), weight 83.6 kg (184 lb 4.9 oz), SpO2 98 %. Physical Exam  Constitutional: She is oriented to person, place, and time. She appears well-developed and well-nourished.  HENT:  Head: Normocephalic and atraumatic.  Eyes: Conjunctivae and EOM are normal. Pupils are equal, round, and reactive to light.  Cardiovascular:  Normal rate.   Respiratory: Effort normal. No respiratory distress. She has no wheezes.    GI: Soft. She exhibits no distension. There is no tenderness.  Musculoskeletal: She exhibits no edema.  Neurological: She is alert and oriented to person, place, and time.  Skin: No rash noted. No erythema.  Psychiatric: She has a normal mood and affect. Her behavior is normal. Judgment and thought content normal.    Assessment/Plan: Plan for right pectoralis muscle flap to the chest / sternal wound with closure and likely VAC incisional dressing. Risks and complications were discussed and reviewed with the patient. She had the opportunity to ask questions.  Wallace Going 09/07/2015, 11:56 AM

## 2015-09-08 NOTE — Brief Op Note (Signed)
08/31/2015 - 09/08/2015  9:29 AM  PATIENT:  [name] [name]  75 y.o. female  PRE-OPERATIVE DIAGNOSIS:  STERNAL WOUND nonhealing  POST-OPERATIVE DIAGNOSIS:  Same   PROCEDURE:  Sternal Wound Irrigation and muscle flap closure using R pectoralis rotational flap  SURGEON:  Surgeon(s) and Role: Panel 1:    * [name], MD - Assistant  Panel 2:    * [name], DO - Primary  PHYSICIAN ASSISTANT:  Shawn  PA-C  ASSISTANTS: TOW RNFA  ANESTHESIA:   general  EBL:  Total I/O In: -  Out: 150 [Blood:150]  BLOOD ADMINISTERED:none  DRAINS: Blake Drains x 2 -- sternal and R breast  LOCAL MEDICATIONS USED:  NONE  SPECIMEN:  No Specimen  DISPOSITION OF SPECIMEN:  N/A  COUNTS:  YES  TOURNIQUET:  * No tourniquets in log *  DICTATION: .Dragon Dictation  PLAN OF CARE: transfer to [place] bed 6  PATIENT DISPOSITION:  PACU - hemodynamically stable.   Delay start of Pharmacological VTE agent (>24hrs) due to surgical blood loss or risk of bleeding: yes

## 2015-09-08 NOTE — Op Note (Addendum)
Operative Note   DATE OF OPERATION: 09/08/2015  LOCATION: [facility]  SURGICAL DIVISION: Plastic Surgery  PREOPERATIVE DIAGNOSES:  Sternal wound post CABG  POSTOPERATIVE DIAGNOSES:  same  PROCEDURE:  Pectoralis major muscle turnover flap for repair of sternal wound with VAC placement. Soft tissue advancement of the left breast 15 x 4 cm.  SURGEON: Claire Sanger Dillingham, DO  ASSISTANT: [name], MD  PA: Erlinda Hong, PA  ANESTHESIA:  General.   DRAIN: [id number]  COMPLICATIONS: None.   INDICATIONS FOR PROCEDURE:  The patient, [name] [name] is a 75 y.o. female born on [birth year], is here for treatment of treatment of a sternal wound infection.  She underwent a CABG and was found to have a sternal wound several weeks after her surgery.  She has undergone debridement and the wound VAC is in place. MRN: [id number]  CONSENT:  Informed consent was obtained directly from the patient. Risks, benefits and alternatives were fully discussed. Specific risks including but not limited to bleeding, infection, hematoma, seroma, scarring, pain, infection, contracture, asymmetry, wound healing problems, and need for further surgery were all discussed. The patient have ample opportunity to have questions answered to satisfaction.   DESCRIPTION OF PROCEDURE:  The patient was taken to the operating room. SCDs were placed and IV antibiotics were given. The patient's operative site was prepped and draped in a sterile fashion. A time out was performed and all information was confirmed to be correct.  General anesthesia was administered.  The VAC was removed and the sternal wound irrigated with antibiotic solution.  The area was clean and there was no sign of abscess of drainage.  The bovie was used to lift the right breast flap over the pectoralis major muscle freeing it from the superficial tissue.  The superior and lateral margins were identified and released.  Once release the deep  portion of the pectoralis was bluntly freed.  The superior portion of the pectoralis attached to the clavicle was released with the bovie.  The thoracoacromial artery and vein were identified and clipped.  The muscle was then released from the insertion using the bovie.  Hemostasis was obtained throughout using the bovie.  A right breast pocket drain was placed and secure laterally with a 4-0 Silk.  The muscle was turned over and placed into the defect.  The left side was lightly secured to the bone with 3-0 Monocryl after placing a drain in the cavity.  The drain was secured more medially with a 3-0 Silk.  The 2-0 Monocryl was used to re-approximate the inferior bone edges  At the level of the soft tissue.  The left breast soft tissue was undermined for 4 cm by 15 cm to decrease tension on the closure. This was then advanced more medially for improved coverage over the defect.  In order to secure it in place the deep layers were closed with 3-0 Monocryl.  The dermis was then closed with 4-0 Monocryl.  The 5-0 Monocryl was used to close the skin.  Duoderm was placed on each side of the incision to protect the skin.  The VAC was secured over the incision site and there was an excellent seal.  The patient tolerated the procedure well.  There were no complications. A breast binder was applied over ABD and biopatch around the drains. The patient was allowed to wake from anesthesia, extubated and taken to the recovery room in satisfactory condition.

## 2015-09-08 NOTE — Op Note (Signed)
NAME:  [name] [name], LAUDANO NO.:  [id number]  MEDICAL RECORD NO.:  [id number]  LOCATION:  [facility]                         FACILITY:  [place]  PHYSICIAN:  [name], M.D.  DATE OF BIRTH:  [birth year]  DATE OF PROCEDURE:  09/08/2015 DATE OF DISCHARGE:                              OPERATIVE REPORT   OPERATION:  Left chest tube placement.  PREOPERATIVE DIAGNOSIS:  Postoperative left pleural effusion after coronary artery bypass grafting.  POSTOPERATIVE DIAGNOSIS:  Postoperative left pleural effusion after coronary artery bypass grafting.  SURGEON:  [name], M.D.  ANESTHESIA:  Local with 1% lidocaine.  DESCRIPTION OF PROCEDURE:  A chest x-ray taken in the operating room after the patient's pectoralis muscle flap showed a significant left pleural effusion with poor aeration of the left lung.  The left intra- breast tissue was prepped and draped as a sterile field.  Local 1% lidocaine was infiltrated in the breast line.  A proper time-out was performed.  A small 1 inch incision was made.  Further lidocaine was infiltrated down to the intercostal space.  Using hemostat, the left pleural space was entered and serosanguineous fluid immediately exited. A 28-French chest tube was placed into the left pleural space and directed to the apex.  It was connected to an underwater seal Pleur-evac drainage system.  A silk suture was used to secure the chest tube to the skin.  A sterile dressing was applied.  A followup chest x-ray showed the chest tube to be in good position with drainage of the left pleural effusion and re-expansion of the left lower lobe.     [name], M.D.     PV/MEDQ  D:  09/08/2015  T:  09/08/2015  Job:  [id number]

## 2015-09-08 NOTE — OR Nursing (Signed)
Chest X-ray performed following first procedure. Dr Darcey Nora performed chest tube insertion following x-ray results.

## 2015-09-08 NOTE — Transfer of Care (Signed)
Immediate Anesthesia Transfer of Care Note  Patient: [name] [name]  Procedure(s) Performed: Procedure(s): STERNAL WOUND DEBRIDEMENT (N/A) Muscle FLAP (N/A) CHEST TUBE INSERTION (Left)  Patient Location: PACU  Anesthesia Type:General  Level of Consciousness: awake, alert  and oriented  Airway & Oxygen Therapy: Patient Spontanous Breathing and Patient connected to nasal cannula oxygen  Post-op Assessment: Report given to RN and Post -op Vital signs reviewed and stable  Post vital signs: Reviewed and stable  Last Vitals:  Filed Vitals:   09/08/15 0400 09/08/15 0413  BP: 146/70   Pulse: 77   Temp:  36.7 C  Resp: 16     Complications: No apparent anesthesia complications

## 2015-09-08 NOTE — Anesthesia Procedure Notes (Signed)
Procedure Name: Intubation Date/Time: 09/08/2015 7:44 AM Performed by: [place] Pink Pre-anesthesia Checklist: Patient identified, Emergency Drugs available, Suction available, Patient being monitored and Timeout performed Patient Re-evaluated:Patient Re-evaluated prior to inductionOxygen Delivery Method: Circle system utilized Preoxygenation: Pre-oxygenation with 100% oxygen Intubation Type: IV induction Ventilation: Mask ventilation without difficulty Laryngoscope Size: Mac and 4 Grade View: Grade I Tube type: Oral Tube size: 7.0 mm Number of attempts: 1 Airway Equipment and Method: Stylet and LTA kit utilized Placement Confirmation: ETT inserted through vocal cords under direct vision,  positive ETCO2 and breath sounds checked- equal and bilateral Secured at: 23 cm Tube secured with: Tape Dental Injury: Teeth and Oropharynx as per pre-operative assessment

## 2015-09-08 NOTE — Interval H&P Note (Signed)
History and Physical Interval Note:  09/08/2015 7:27 AM  [name] [name]  has presented today for surgery, with the diagnosis of STERNAL WOUND INFECTION  The various methods of treatment have been discussed with the patient and family. After consideration of risks, benefits and other options for treatment, the patient has consented to  Procedure(s): STERNAL WOUND DEBRIDEMENT (N/A) Muscle FLAP (N/A) as a surgical intervention .  The patient's history has been reviewed, patient examined, no change in status, stable for surgery.  I have reviewed the patient's chart and labs.  Questions were answered to the patient's satisfaction.     Wallace Going

## 2015-09-08 NOTE — Progress Notes (Signed)
The patient was examined and preop studies reviewed. There has been no change from the prior exam and the patient is ready for surgery.  plan sternal debridement and muscle flap sternal reconstruction on F [place]

## 2015-09-08 NOTE — Anesthesia Preprocedure Evaluation (Signed)
Anesthesia Evaluation  Patient identified by MRN, date of birth, ID band Patient awake    Reviewed: Allergy & Precautions, NPO status , Patient's Chart, lab work & pertinent test results  Airway Mallampati: I  TM Distance: >3 FB Neck ROM: Full    Dental no notable dental hx.    Pulmonary neg pulmonary ROS,    Pulmonary exam normal breath sounds clear to auscultation       Cardiovascular hypertension, Pt. on medications + CAD, + Past MI and + CABG  Normal cardiovascular exam Rhythm:Regular Rate:Normal  CABG done 1/19, last EF 50-60%   Neuro/Psych negative neurological ROS  negative psych ROS   GI/Hepatic negative GI ROS, Neg liver ROS,   Endo/Other  negative endocrine ROS  Renal/GU negative Renal ROS  negative genitourinary   Musculoskeletal negative musculoskeletal ROS (+)   Abdominal   Peds negative pediatric ROS (+)  Hematology  (+) anemia ,   Anesthesia Other Findings Sternal abscess  Reproductive/Obstetrics negative OB ROS                             Anesthesia Physical  Anesthesia Plan  ASA: III  Anesthesia Plan: General   Post-op Pain Management:    Induction: Intravenous  Airway Management Planned: Oral ETT  Additional Equipment:   Intra-op Plan:   Post-operative Plan: Extubation in OR  Informed Consent: I have reviewed the patients History and Physical, chart, labs and discussed the procedure including the risks, benefits and alternatives for the proposed anesthesia with the patient or authorized representative who has indicated his/her understanding and acceptance.   Dental advisory given  Plan Discussed with: CRNA, Anesthesiologist and Surgeon  Anesthesia Plan Comments: (Retrosternal abscess s/p CABG >3 weeks ago, now for plastic aided muscle flap  GA with ETT, standard ASA monitors, will seek to optimize volume status and minimize anesthetic depth to avoid  use of vasopressors in setting of muscle flap)        Anesthesia Quick Evaluation

## 2015-09-08 NOTE — Brief Op Note (Signed)
08/31/2015 - 09/08/2015  7:38 AM  PATIENT:  [name] [name]  75 y.o. female  PRE-OPERATIVE DIAGNOSIS:  STERNAL WOUND INFECTION  POST-OPERATIVE DIAGNOSIS:  same  PROCEDURE:  Procedure(s): STERNAL WOUND DEBRIDEMENT (N/A) Muscle FLAP (N/A)  SURGEON:  Surgeon(s) and Role: Panel 1:    * [name], MD - Primary  Panel 2:    * [name], DO - Primary  PHYSICIAN ASSISTANT: Shawn Rayburn, PA  ASSISTANTS: none   ANESTHESIA:   general  EBL:     BLOOD ADMINISTERED:  DRAINS: one in the sternal area and one in the right breast pocket.  LOCAL MEDICATIONS USED:  MARCAINE     SPECIMEN:  No Specimen  DISPOSITION OF SPECIMEN:  N/A  COUNTS:  YES  TOURNIQUET:  * No tourniquets in log *  DICTATION: .Dragon Dictation  PLAN OF CARE: Admit to inpatient   PATIENT DISPOSITION:  PACU - hemodynamically stable.   Delay start of Pharmacological VTE agent (>24hrs) due to surgical blood loss or risk of bleeding: no

## 2015-09-09 ENCOUNTER — Inpatient Hospital Stay (HOSPITAL_COMMUNITY): Payer: Medicare PPO

## 2015-09-09 LAB — BASIC METABOLIC PANEL
Anion gap: 12 (ref 5–15)
BUN: 12 mg/dL (ref 6–20)
CO2: 23 mmol/L (ref 22–32)
Calcium: 7.9 mg/dL — ABNORMAL LOW (ref 8.9–10.3)
Chloride: 103 mmol/L (ref 101–111)
Creatinine, Ser: 1.36 mg/dL — ABNORMAL HIGH (ref 0.44–1.00)
GFR calc Af Amer: 43 mL/min — ABNORMAL LOW (ref >60)
GFR calc non Af Amer: 37 mL/min — ABNORMAL LOW (ref >60)
Glucose, Bld: 288 mg/dL — ABNORMAL HIGH (ref 65–99)
Potassium: 3.9 mmol/L (ref 3.5–5.1)
Sodium: 138 mmol/L (ref 135–145)

## 2015-09-09 LAB — GLUCOSE, CAPILLARY
Glucose-Capillary: 135 mg/dL — ABNORMAL HIGH (ref 65–99)
Glucose-Capillary: 144 mg/dL — ABNORMAL HIGH (ref 65–99)
Glucose-Capillary: 101 mg/dL — ABNORMAL HIGH (ref 65–99)
Glucose-Capillary: 109 mg/dL — ABNORMAL HIGH (ref 65–99)

## 2015-09-09 LAB — CBC
HCT: 24.9 % — ABNORMAL LOW (ref 36.0–46.0)
Hemoglobin: 8 g/dL — ABNORMAL LOW (ref 12.0–15.0)
MCH: 28.4 pg (ref 26.0–34.0)
MCHC: 32.1 g/dL (ref 30.0–36.0)
MCV: 88.3 fL (ref 78.0–100.0)
Platelets: 233 10*3/uL (ref 150–400)
RBC: 2.82 MIL/uL — ABNORMAL LOW (ref 3.87–5.11)
RDW: 14.8 % (ref 11.5–15.5)
WBC: 11 10*3/uL — ABNORMAL HIGH (ref 4.0–10.5)

## 2015-09-09 MED ORDER — FUROSEMIDE 10 MG/ML IJ SOLN
20.0000 mg | Freq: Once | INTRAMUSCULAR | Status: AC
  Administered 2015-09-09: 20 mg via INTRAVENOUS
  Filled 2015-09-09: qty 2

## 2015-09-09 MED ORDER — FE FUMARATE-B12-VIT C-FA-IFC PO CAPS
1.0000 | ORAL_CAPSULE | Freq: Two times a day (BID) | ORAL | Status: DC
  Administered 2015-09-09 – 2015-09-14 (×9): 1 via ORAL
  Filled 2015-09-09 (×13): qty 1

## 2015-09-09 MED ORDER — SODIUM CHLORIDE 0.9 % IV SOLN
INTRAVENOUS | Status: DC
  Administered 2015-09-09 – 2015-09-11 (×2): via INTRAVENOUS

## 2015-09-09 MED ORDER — INSULIN ASPART 100 UNIT/ML ~~LOC~~ SOLN
0.0000 [IU] | Freq: Every day | SUBCUTANEOUS | Status: DC
Start: 2015-09-09 — End: 2015-09-14

## 2015-09-09 MED ORDER — INSULIN ASPART 100 UNIT/ML ~~LOC~~ SOLN
0.0000 [IU] | Freq: Three times a day (TID) | SUBCUTANEOUS | Status: DC
  Administered 2015-09-09 – 2015-09-13 (×3): 2 [IU] via SUBCUTANEOUS

## 2015-09-09 MED ORDER — DEXTROSE 10 % IV SOLN: INTRAVENOUS | Status: DC

## 2015-09-09 NOTE — Progress Notes (Signed)
1 Day Post-Op Procedure(s) (LRB): STERNAL WOUND DEBRIDEMENT (N/A) Muscle FLAP (N/A) CHEST TUBE INSERTION (Left) Subjective: Sitting in chair- comfortable Nsr, afebrile Min JP drainage Expected postop anemia- start Niferex  Objective: Vital signs in last 24 hours: Temp:  [97.4 F (36.3 C)-98.4 F (36.9 C)] 98.4 F (36.9 C) (02/14 0748) Pulse Rate:  [75-109] 88 (02/14 0800) Cardiac Rhythm:  [-] Sinus tachycardia (02/14 0800) Resp:  [13-25] 19 (02/14 0800) BP: (101-156)/(58-78) 118/69 mmHg (02/14 0800) SpO2:  [93 %-100 %] 96 % (02/14 0800) Weight:  [191 lb 1.6 oz (86.682 kg)] 191 lb 1.6 oz (86.682 kg) (02/14 0600)  Hemodynamic parameters for last 24 hours:  stable  Intake/Output from previous day: 02/13 0701 - 02/14 0700 In: 2440 [P.O.:480; I.V.:1860; IV Piggyback:100] Out: 2435 [Urine:985; Drains:210; Blood:350; Chest Tube:890] Intake/Output this shift: Total I/O In: 50 [I.V.:50] Out: 40 [Urine:40]       Exam    General- alert and comfortable   Lungs- clear without rales, wheezes   Cor- regular rate and rhythm, no murmur , gallop   Abdomen- soft, non-tender   Extremities - warm, non-tender, minimal edema   Neuro- oriented, appropriate, no focal weakness   Lab Results:  Recent Labs  09/08/15 1345 09/09/15 0555  WBC 15.8* 11.0*  HGB 9.7* 8.0*  HCT 29.9* 24.9*  PLT 264 233   BMET:  Recent Labs  09/08/15 1345 09/09/15 0555  NA 143 138  K 3.8 3.9  CL 108 103  CO2 25 23  GLUCOSE 121* 288*  BUN 12 12  CREATININE 1.40* 1.36*  CALCIUM 8.4* 7.9*    PT/INR:  Recent Labs  09/07/15 1641  LABPROT 15.7*  INR 1.24   ABG    Component Value Date/Time   PHART 7.461* 09/07/2015 1740   HCO3 24.0 09/07/2015 1740   TCO2 25 09/07/2015 1740   O2SAT 94.0 09/07/2015 1740   CBG (last 3)   Recent Labs  09/09/15 0745  GLUCAP 135*    Assessment/Plan: S/P Procedure(s) (LRB): STERNAL WOUND DEBRIDEMENT (N/A) Muscle FLAP (N/A) CHEST TUBE INSERTION  (Left) Mobilize Diuresis Diabetes control plan transfer to stepdown tomorrow   LOS: 9 days    [name] III 09/09/2015

## 2015-09-09 NOTE — Progress Notes (Signed)
Physical Therapy Treatment Patient Details Name: [name] [name] MRN: [id number] DOB: [birth year] Today's Date: 09/09/2015    History of Present Illness pt is a 75 y/o female with h/o HTN and very recent CABG, admitted with upper sternotomy incision infection, s/p I and D with VAC placement.  S/p return to OR 09/08/15 for Sternal Wound Irrigation and muscle flap closure using R pectoralis rotational flap and L chest tube placement.    PT Comments    Patient limited following return to OR yesterday.  Do feel she now may need post acute rehab in the home.  Feel continued skilled PT in the acute setting helpful to return home with family support at d/c.  Follow Up Recommendations  Home health PT;Supervision for mobility/OOB     Equipment Recommendations  None recommended by PT    Recommendations for Other Services       Precautions / Restrictions Precautions Precautions: Sternal Restrictions Weight Bearing Restrictions: Yes (sternal precautions)    Mobility  Bed Mobility               General bed mobility comments: NT up in chair  Transfers Overall transfer level: Needs assistance Equipment used: Rolling walker (2 wheeled) Transfers: Sit to/from Stand Sit to Stand: Min assist         General transfer comment: cues for hand placement, assit for anterior weight shift  Ambulation/Gait Ambulation/Gait assistance: Min guard Ambulation Distance (Feet): 90 Feet Assistive device: Rolling walker (2 wheeled) Gait Pattern/deviations: Step-through pattern;Decreased stride length;Trunk flexed     General Gait Details: pain and fatigue limiting mobility, but able to walk some, also frustrated with multiple lines   Stairs            Wheelchair Mobility    Modified Rankin (Stroke Patients Only)       Balance Overall balance assessment: Needs assistance   Sitting balance-Leahy Scale: Good     Standing balance support: Bilateral upper extremity  supported Standing balance-Leahy Scale: Poor Standing balance comment: UE support needed due to pain                    Cognition Arousal/Alertness: Awake/alert Behavior During Therapy: WFL for tasks assessed/performed Overall Cognitive Status: Within Functional Limits for tasks assessed                      Exercises General Exercises - Lower Extremity Ankle Circles/Pumps: AROM;Both;10 reps;Seated Long Arc Quad: AROM;Both;5 reps;Seated    General Comments        Pertinent Vitals/Pain Pain Score: 8  Pain Location: chest Pain Descriptors / Indicators: Sore;Discomfort Pain Intervention(s): Monitored during session;Repositioned    Home Living                      Prior Function            PT Goals (current goals can now be found in the care plan section) Progress towards PT goals: Not progressing toward goals - comment (increased assist for mobility following return to OR)    Frequency  Min 3X/week    PT Plan Discharge plan needs to be updated    Co-evaluation             End of Session Equipment Utilized During Treatment: Gait belt Activity Tolerance: Patient limited by pain;Patient limited by fatigue Patient left: in chair;with call bell/phone within reach     Time: 0947-1010 PT Time Calculation (min) (ACUTE ONLY): 23 min  Charges:  $Gait Training: 8-22 mins $Therapeutic Exercise: 8-22 mins                    G Codes:      Reginia Naas Sep 23, 2015, 10:41 AM  Magda Kiel, PT [phone] 09-23-15

## 2015-09-09 NOTE — Anesthesia Postprocedure Evaluation (Signed)
Anesthesia Post Note  Patient: [name] [name]  Procedure(s) Performed: Procedure(s) (LRB): STERNAL WOUND DEBRIDEMENT (N/A) Muscle FLAP (N/A) CHEST TUBE INSERTION (Left)  Patient location during evaluation: PACU Anesthesia Type: General Level of consciousness: awake and alert Pain management: pain level controlled Vital Signs Assessment: post-procedure vital signs reviewed and stable Respiratory status: spontaneous breathing, nonlabored ventilation and respiratory function stable Cardiovascular status: blood pressure returned to baseline and stable Postop Assessment: no signs of nausea or vomiting Anesthetic complications: no               Zenaida Deed

## 2015-09-09 NOTE — Progress Notes (Signed)
[facility] BRIEF SICU PROGRESS NOTE  1 Day Post-Op  S/P Procedure(s) (LRB): STERNAL WOUND DEBRIDEMENT (N/A) Muscle FLAP (N/A) CHEST TUBE INSERTION (Left)   Stable day  Plan: Continue current plan  Rexene Alberts, MD 09/09/2015 7:20 PM

## 2015-09-09 NOTE — Care Management Important Message (Signed)
Important Message  Patient Details  Name: [name] [name] MRN: [id number] Date of Birth: [birth year]   Medicare Important Message Given:  Yes    Nathen May 09/09/2015, 5:01 PM

## 2015-09-10 ENCOUNTER — Encounter (HOSPITAL_COMMUNITY): Payer: Self-pay | Admitting: *Deleted

## 2015-09-10 ENCOUNTER — Inpatient Hospital Stay (HOSPITAL_COMMUNITY): Payer: Medicare PPO

## 2015-09-10 LAB — CBC
HCT: 24.9 % — ABNORMAL LOW (ref 36.0–46.0)
Hemoglobin: 7.6 g/dL — ABNORMAL LOW (ref 12.0–15.0)
MCH: 26.9 pg (ref 26.0–34.0)
MCHC: 30.5 g/dL (ref 30.0–36.0)
MCV: 88 fL (ref 78.0–100.0)
Platelets: 264 10*3/uL (ref 150–400)
RBC: 2.83 MIL/uL — ABNORMAL LOW (ref 3.87–5.11)
RDW: 15.1 % (ref 11.5–15.5)
WBC: 11.5 10*3/uL — ABNORMAL HIGH (ref 4.0–10.5)

## 2015-09-10 LAB — GLUCOSE, CAPILLARY
Glucose-Capillary: 111 mg/dL — ABNORMAL HIGH (ref 65–99)
Glucose-Capillary: 130 mg/dL — ABNORMAL HIGH (ref 65–99)
Glucose-Capillary: 111 mg/dL — ABNORMAL HIGH (ref 65–99)
Glucose-Capillary: 118 mg/dL — ABNORMAL HIGH (ref 65–99)

## 2015-09-10 LAB — COMPREHENSIVE METABOLIC PANEL
ALT: 16 U/L (ref 14–54)
AST: 20 U/L (ref 15–41)
Albumin: 1.7 g/dL — ABNORMAL LOW (ref 3.5–5.0)
Alkaline Phosphatase: 84 U/L (ref 38–126)
Anion gap: 7 (ref 5–15)
BUN: 13 mg/dL (ref 6–20)
CO2: 26 mmol/L (ref 22–32)
Calcium: 8 mg/dL — ABNORMAL LOW (ref 8.9–10.3)
Chloride: 105 mmol/L (ref 101–111)
Creatinine, Ser: 1.3 mg/dL — ABNORMAL HIGH (ref 0.44–1.00)
GFR calc Af Amer: 46 mL/min — ABNORMAL LOW (ref >60)
GFR calc non Af Amer: 39 mL/min — ABNORMAL LOW (ref >60)
Glucose, Bld: 111 mg/dL — ABNORMAL HIGH (ref 65–99)
Potassium: 3.4 mmol/L — ABNORMAL LOW (ref 3.5–5.1)
Sodium: 138 mmol/L (ref 135–145)
Total Bilirubin: 0.4 mg/dL (ref 0.3–1.2)
Total Protein: 5.1 g/dL — ABNORMAL LOW (ref 6.5–8.1)

## 2015-09-10 MED ORDER — DEXTROSE 5 % IV SOLN
1.5000 g | Freq: Two times a day (BID) | INTRAVENOUS | Status: AC
  Administered 2015-09-10 (×2): 1.5 g via INTRAVENOUS
  Filled 2015-09-10 (×3): qty 1.5

## 2015-09-10 MED ORDER — POTASSIUM CHLORIDE CRYS ER 20 MEQ PO TBCR
20.0000 meq | EXTENDED_RELEASE_TABLET | Freq: Two times a day (BID) | ORAL | Status: AC
  Administered 2015-09-10 – 2015-09-12 (×5): 20 meq via ORAL
  Filled 2015-09-10 (×5): qty 1

## 2015-09-10 NOTE — Progress Notes (Signed)
PT Cancellation Note  Patient Details Name: [name] MRN: [id number] DOB: 21-Apr-1941   Cancelled Treatment:    Reason Eval/Treat Not Completed: Fatigue/lethargy limiting ability to participate; patient just back to bed after up all morning and too fatigued to participate right now with PT. Will attempt to see tomorrow AM prior to fatigue.  Encouraged pt to walk with nursing later if able.   Reginia Naas 09/10/2015, 2:38 PM  Magda Kiel, [place] 09/10/2015

## 2015-09-10 NOTE — Progress Notes (Signed)
09/10/2015 1035 Received pt to 2w22 from 2S.  Pt is A&O, no c/o voiced.  Tele monitor applied and CCMD notified.  Oriented to room, call light and bed.  Call bell in reach. [place]

## 2015-09-10 NOTE — Progress Notes (Signed)
Pt sts she is just to bed and so comfortable. Declined but sts she promises to walk later with nursing. Will f/u tomorrow. Yves Dill CES, ACSM 2:51 PM 09/10/2015

## 2015-09-10 NOTE — Progress Notes (Signed)
2 Days Post-Op Procedure(s) (LRB): STERNAL WOUND DEBRIDEMENT (N/A) Muscle FLAP (N/A) CHEST TUBE INSERTION (Left) Subjective: Afebrile, nsr, CXR clear Min JP drainage Pain controlled Will DC chest tube and tx to stepdown 2 west  Objective: Vital signs in last 24 hours: Temp:  [97.9 F (36.6 C)-98.5 F (36.9 C)] 98.3 F (36.8 C) (02/15 0743) Pulse Rate:  [76-108] 77 (02/15 0800) Cardiac Rhythm:  [-] Normal sinus rhythm (02/15 0800) Resp:  [12-28] 14 (02/15 0800) BP: (106-129)/(57-86) 112/66 mmHg (02/15 0800) SpO2:  [93 %-100 %] 96 % (02/15 0800) Weight:  [182 lb 12.2 oz (82.9 kg)] 182 lb 12.2 oz (82.9 kg) (02/15 0600)  Hemodynamic parameters for last 24 hours:  stable  Intake/Output from previous day: 02/14 0701 - 02/15 0700 In: 1275.7 [P.O.:960; I.V.:265.7; IV Piggyback:50] Out: 785 [Urine:635; Drains:90; Chest Tube:60] Intake/Output this shift: Total I/O In: 10 [I.V.:10] Out: 0        Exam    General- alert and comfortable   Lungs- clear without rales, wheezes   Cor- regular rate and rhythm, no murmur , gallop   Abdomen- soft, non-tender   Extremities - warm, non-tender, minimal edema   Neuro- oriented, appropriate, no focal weakness  Lab Results:  Recent Labs  09/09/15 0555 09/10/15 0614  WBC 11.0* 11.5*  HGB 8.0* 7.6*  HCT 24.9* 24.9*  PLT 233 264   BMET:  Recent Labs  09/09/15 0555 09/10/15 0614  NA 138 138  K 3.9 3.4*  CL 103 105  CO2 23 26  GLUCOSE 288* 111*  BUN 12 13  CREATININE 1.36* 1.30*  CALCIUM 7.9* 8.0*    PT/INR:  Recent Labs  09/07/15 1641  LABPROT 15.7*  INR 1.24   ABG    Component Value Date/Time   PHART 7.461* 09/07/2015 1740   HCO3 24.0 09/07/2015 1740   TCO2 25 09/07/2015 1740   O2SAT 94.0 09/07/2015 1740   CBG (last 3)   Recent Labs  09/09/15 1723 09/09/15 2148 09/10/15 0739  GLUCAP 101* 109* 111*    Assessment/Plan: S/P Procedure(s) (LRB): STERNAL WOUND DEBRIDEMENT (N/A) Muscle FLAP (N/A) CHEST  TUBE INSERTION (Left) Plan for transfer to step-down: see transfer orders Expected postop blood loss anemia- cont po Iron, folate  LOS: 10 days    [name] [name] 09/10/2015

## 2015-09-10 NOTE — Progress Notes (Signed)
2 Days Post-Op  Subjective: POD 2 the patient is doing very well.  She is up to the chair and states she has little to no pain.  Her minister is visiting.  The breast binder is in place.  The sternal drain was removed yesterday.  The right lateral drain is in with minimal output serosanginous in color.  No sign of fluid retention of seroma.    Objective: Vital signs in last 24 hours: Temp:  [97.8 F (36.6 C)-98.5 F (36.9 C)] 97.8 F (36.6 C) (02/15 1047) Pulse Rate:  [76-99] 81 (02/15 1047) Resp:  [12-23] 20 (02/15 1047) BP: (107-131)/(57-86) 131/67 mmHg (02/15 1047) SpO2:  [93 %-100 %] 100 % (02/15 1047) Weight:  [82.9 kg (182 lb 12.2 oz)] 82.9 kg (182 lb 12.2 oz) (02/15 0600) Last BM Date: 09/09/15  Intake/Output from previous day: 02/14 0701 - 02/15 0700 In: 1275.7 [P.O.:960; I.V.:265.7; IV Piggyback:50] Out: 785 [Urine:635; Drains:90; Chest Tube:60] Intake/Output this shift: Total I/O In: 677 [P.O.:597; I.V.:30; IV Piggyback:50] Out: 0   General appearance: alert, cooperative and no distress Incision/Wound:VAC in place, minimal output  Lab Results:   Recent Labs  09/09/15 0555 09/10/15 0614  WBC 11.0* 11.5*  HGB 8.0* 7.6*  HCT 24.9* 24.9*  PLT 233 264   BMET  Recent Labs  09/09/15 0555 09/10/15 0614  NA 138 138  K 3.9 3.4*  CL 103 105  CO2 23 26  GLUCOSE 288* 111*  BUN 12 13  CREATININE 1.36* 1.30*  CALCIUM 7.9* 8.0*   PT/INR  Recent Labs  09/07/15 1641  LABPROT 15.7*  INR 1.24   ABG  Recent Labs  09/07/15 1740  PHART 7.461*  HCO3 24.0    Studies/Results: Dg Chest Port 1 View  09/10/2015  CLINICAL DATA:  Sternal wound debridement for infection, chest tube insertion. EXAM: PORTABLE CHEST 1 VIEW COMPARISON:  Portable chest x-ray of September 09, 2015 FINDINGS: The lungs are well-expanded. A small amount of pleural fluid blunts the costophrenic angles. There is mild subsegmental atelectasis at both lung bases. There is no pneumothorax or  pneumomediastinum. The heart is top-normal in size. The pulmonary vascularity is not engorged. There is mild widening of the mediastinum. A longitudinally oriented new mediastinal drainage catheter has its tip projecting over the T4 vertebral body. The left-sided chest tube tip projects over the posterior medial aspect of the seventh rib. A right-sided PICC line tip projects over the midportion of the SVC. There is an additional tubular structure that projects over the right axillary region which is stable. IMPRESSION: 1. The chest and mediastinal drainage tubes are in stable position. Mild mediastinal widening persists. 2. Persistent bibasilar subsegmental atelectasis. Traces of pleural fluid blunt the costophrenic angles. No pneumothorax or pneumomediastinum. Electronically Signed   By: David  Martinique M.D.   On: 09/10/2015 07:55   Dg Chest Port 1 View  09/09/2015  CLINICAL DATA:  Shortness of Breath EXAM: PORTABLE CHEST 1 VIEW COMPARISON:  09/08/2015 FINDINGS: Cardiac shadow is mildly enlarged but stable. Postsurgical changes consistent with coronary bypass grafting are seen. A right-sided PICC line, mediastinal drain and left thoracostomy catheter are again identified and stable. Small bilateral pleural effusions are noted. Mild residual atelectasis is noted on the left. No pneumothorax is seen. IMPRESSION: Tubes and lines stable in appearance. Improving aeration left base with minimal residual atelectasis. New small right-sided pleural effusion. Electronically Signed   By: Inez Catalina M.D.   On: 09/09/2015 07:50    Anti-infectives: Anti-infectives  Start     Dose/Rate Route Frequency Ordered Stop   09/10/15 1000  cefUROXime (ZINACEF) 1.5 g in dextrose 5 % 50 mL IVPB     1.5 g 100 mL/hr over 30 Minutes Intravenous Every 12 hours 09/10/15 0842 09/11/15 0959   09/08/15 1400  cefUROXime (ZINACEF) 1.5 g in dextrose 5 % 50 mL IVPB     1.5 g 100 mL/hr over 30 Minutes Intravenous Every 12 hours 09/08/15  1258 09/09/15 1055   09/08/15 0822  polymyxin B 500,000 Units, bacitracin 50,000 Units in sodium chloride irrigation 0.9 % 500 mL irrigation  Status:  Discontinued       As needed 09/08/15 0822 09/08/15 1048   09/08/15 0700  ceFAZolin (ANCEF) IVPB 2 g/50 mL premix  Status:  Discontinued     2 g 100 mL/hr over 30 Minutes Intravenous To ShortStay Surgical 09/05/15 0824 09/08/15 1214   09/08/15 0600  cefUROXime (ZINACEF) 1.5 g in dextrose 5 % 50 mL IVPB  Status:  Discontinued     1.5 g 100 mL/hr over 30 Minutes Intravenous 60 min pre-op 09/07/15 1618 09/07/15 1619   09/07/15 1415  piperacillin-tazobactam (ZOSYN) IVPB 3.375 g  Status:  Discontinued     3.375 g 12.5 mL/hr over 240 Minutes Intravenous 3 times per day 09/07/15 1402 09/08/15 1258   09/07/15 0600  vancomycin (VANCOCIN) IVPB 1000 mg/200 mL premix  Status:  Discontinued     1,000 mg 200 mL/hr over 60 Minutes Intravenous Every 48 hours 09/07/15 0517 09/08/15 1258   09/06/15 1400  piperacillin-tazobactam (ZOSYN) IVPB 2.25 g  Status:  Discontinued     2.25 g 100 mL/hr over 30 Minutes Intravenous 3 times per day 09/06/15 0805 09/07/15 1402   09/04/15 1106  vancomycin (VANCOCIN) powder  Status:  Discontinued       As needed 09/04/15 1106 09/04/15 1220   09/01/15 1930  vancomycin (VANCOCIN) IVPB 1000 mg/200 mL premix  Status:  Discontinued     1,000 mg 200 mL/hr over 60 Minutes Intravenous Every 12 hours 09/01/15 1127 09/05/15 1953   09/01/15 1400  vancomycin (VANCOCIN) 1,250 mg in sodium chloride 0.9 % 250 mL IVPB  Status:  Discontinued     1,250 mg 166.7 mL/hr over 90 Minutes Intravenous Every 24 hours 08/31/15 2043 09/01/15 1026   09/01/15 1400  piperacillin-tazobactam (ZOSYN) IVPB 3.375 g  Status:  Discontinued     3.375 g 12.5 mL/hr over 240 Minutes Intravenous 3 times per day 09/01/15 1024 09/06/15 0805   09/01/15 0829  vancomycin (VANCOCIN) powder  Status:  Discontinued       As needed 09/01/15 0830 09/01/15 0910   09/01/15 0800   piperacillin-tazobactam (ZOSYN) IVPB 3.375 g  Status:  Discontinued     3.375 g 100 mL/hr over 30 Minutes Intravenous STAT 09/01/15 0751 09/02/15 0756   09/01/15 0800  vancomycin (VANCOCIN) 1,000 mg in sodium chloride 0.9 % 1,000 mL irrigation      Irrigation To Surgery 09/01/15 0759 09/01/15 0813   09/01/15 0745  piperacillin-tazobactam (ZOSYN) IVPB 3.375 g  Status:  Discontinued     3.375 g 12.5 mL/hr over 240 Minutes Intravenous 4 times per day 09/01/15 0737 09/01/15 1023   09/01/15 0745  vancomycin (VANCOCIN) 1,250 mg in sodium chloride 0.9 % 250 mL IVPB     1,250 mg 166.7 mL/hr over 90 Minutes Intravenous To Surgery 09/01/15 0742 09/02/15 0745   09/01/15 0726  vancomycin (VANCOCIN) 1 GM/200ML IVPB    Comments:  Theodoro Grist   :  cabinet override      09/01/15 0726 09/01/15 1929   09/01/15 0726  dextrose 5 % with cefUROXime (ZINACEF) ADS Med    Comments:  Theodoro Grist   : cabinet override      09/01/15 0726 09/01/15 0744   09/01/15 0723  polymyxin B 500,000 Units, bacitracin 50,000 Units in sodium chloride irrigation 0.9 % 500 mL irrigation  Status:  Discontinued       As needed 09/01/15 0724 09/01/15 0910   08/31/15 2045  vancomycin (VANCOCIN) IVPB 750 mg/150 ml premix  Status:  Discontinued     750 mg 150 mL/hr over 60 Minutes Intravenous Every 12 hours 08/31/15 2036 08/31/15 2037   08/31/15 1645  vancomycin (VANCOCIN) IVPB 1000 mg/200 mL premix     1,000 mg 200 mL/hr over 60 Minutes Intravenous  Once 08/31/15 1638 08/31/15 1911      Assessment/Plan: s/p Procedure(s): STERNAL WOUND DEBRIDEMENT (N/A) Muscle FLAP (N/A) CHEST TUBE INSERTION (Left) Continue to focus of protein intake and vitamins.  No heavy lifting.  Continue with the binder at all times.  We will plan to remove the drain in 2 weeks.  Follow up in the office in one week.  LOS: 10 days    Wallace Going 09/10/2015

## 2015-09-11 LAB — CBC
HCT: 23.2 % — ABNORMAL LOW (ref 36.0–46.0)
Hemoglobin: 7.3 g/dL — ABNORMAL LOW (ref 12.0–15.0)
MCH: 27.3 pg (ref 26.0–34.0)
MCHC: 31.5 g/dL (ref 30.0–36.0)
MCV: 86.9 fL (ref 78.0–100.0)
Platelets: 293 10*3/uL (ref 150–400)
RBC: 2.67 MIL/uL — ABNORMAL LOW (ref 3.87–5.11)
RDW: 15.1 % (ref 11.5–15.5)
WBC: 9.7 10*3/uL (ref 4.0–10.5)

## 2015-09-11 LAB — GLUCOSE, CAPILLARY
Glucose-Capillary: 112 mg/dL — ABNORMAL HIGH (ref 65–99)
Glucose-Capillary: 109 mg/dL — ABNORMAL HIGH (ref 65–99)
Glucose-Capillary: 90 mg/dL (ref 65–99)
Glucose-Capillary: 99 mg/dL (ref 65–99)

## 2015-09-11 LAB — TYPE AND SCREEN
ABO/RH(D): O POS
ANTIBODY SCREEN: NEGATIVE
UNIT DIVISION: 0
UNIT DIVISION: 0

## 2015-09-11 NOTE — Progress Notes (Signed)
[facility] [address]       [employer] [id number]             [phone]      3 Days Post-Op Procedure(s) (LRB): STERNAL WOUND DEBRIDEMENT (N/A) Muscle FLAP (N/A) CHEST TUBE INSERTION (Left) Subjective: sore  Objective: Vital signs in last 24 hours: Temp:  [97.8 F (36.6 C)-98.2 F (36.8 C)] 98.2 F (36.8 C) (02/16 0416) Pulse Rate:  [81-96] 84 (02/16 0416) Cardiac Rhythm:  [-] Normal sinus rhythm;Heart block (02/16 0700) Resp:  [16-22] 18 (02/16 0416) BP: (120-131)/(62-75) 127/67 mmHg (02/16 0416) SpO2:  [95 %-100 %] 96 % (02/16 0416) Weight:  [181 lb 14.1 oz (82.5 kg)] 181 lb 14.1 oz (82.5 kg) (02/16 0416)  Hemodynamic parameters for last 24 hours:    Intake/Output from previous day: 02/15 0701 - 02/16 0700 In: 1157 [P.O.:1077; I.V.:30; IV Piggyback:50] Out: 535 [Urine:500; Drains:35] Intake/Output this shift: Total I/O In: -  Out: 20 [Drains:40]   PE Gen: NAD Lungs: dim in bases Cor: RRR Wound : Vac in place EXT: no edema   Lab Results:  Recent Labs  09/10/15 0614 09/11/15 0520  WBC 11.5* 9.7  HGB 7.6* 7.3*  HCT 24.9* 23.2*  PLT 264 293   BMET:  Recent Labs  09/09/15 0555 09/10/15 0614  NA 138 138  K 3.9 3.4*  CL 103 105  CO2 23 26  GLUCOSE 288* 111*  BUN 12 13  CREATININE 1.36* 1.30*  CALCIUM 7.9* 8.0*    PT/INR: No results for input(s): LABPROT, INR in the last 72 hours. ABG    Component Value Date/Time   PHART 7.461* 09/07/2015 1740   HCO3 24.0 09/07/2015 1740   TCO2 25 09/07/2015 1740   O2SAT 94.0 09/07/2015 1740   CBG (last 3)   Recent Labs  09/10/15 1629 09/10/15 2040 09/11/15 0634  GLUCAP 111* 118* 112*    Meds Scheduled Meds: . acetaminophen  1,000 mg Oral 4 times per day   Or  . acetaminophen (TYLENOL) oral liquid 160 mg/5 mL  1,000 mg Oral 4 times per day  . aspirin EC  325 mg Oral Daily  . atorvastatin  80 mg Oral q1800  . feeding supplement  237 mL Oral TID WC  . feeding supplement (ENSURE  ENLIVE)  237 mL Oral TID WC  . ferrous Q000111Q C-folic acid  1 capsule Oral BID  . insulin aspart  0-15 Units Subcutaneous TID WC  . insulin aspart  0-5 Units Subcutaneous QHS  . metoprolol  50 mg Oral BID  . pantoprazole  40 mg Oral Q1200  . potassium chloride  20 mEq Oral BID  . senna-docusate  1 tablet Oral QHS  . sodium chloride flush  10-40 mL Intracatheter Q12H   Continuous Infusions: . sodium chloride 10 mL/hr at 09/10/15 1000   PRN Meds:.fentaNYL (SUBLIMAZE) injection, ondansetron (ZOFRAN) IV, oxyCODONE, potassium chloride, sodium chloride flush, traMADol  Xrays Dg Chest Port 1 View  09/10/2015  CLINICAL DATA:  Sternal wound debridement for infection, chest tube insertion. EXAM: PORTABLE CHEST 1 VIEW COMPARISON:  Portable chest x-ray of September 09, 2015 FINDINGS: The lungs are well-expanded. A small amount of pleural fluid blunts the costophrenic angles. There is mild subsegmental atelectasis at both lung bases. There is no pneumothorax or pneumomediastinum. The heart is top-normal in size. The pulmonary vascularity is not engorged. There is mild widening of the mediastinum. A longitudinally oriented new mediastinal drainage catheter has its tip projecting over  the T4 vertebral body. The left-sided chest tube tip projects over the posterior medial aspect of the seventh rib. A right-sided PICC line tip projects over the midportion of the SVC. There is an additional tubular structure that projects over the right axillary region which is stable. IMPRESSION: 1. The chest and mediastinal drainage tubes are in stable position. Mild mediastinal widening persists. 2. Persistent bibasilar subsegmental atelectasis. Traces of pleural fluid blunt the costophrenic angles. No pneumothorax or pneumomediastinum. Electronically Signed   By: David  Martinique M.D.   On: 09/10/2015 07:55    Assessment/Plan: S/P Procedure(s) (LRB): STERNAL WOUND DEBRIDEMENT (N/A) Muscle FLAP (N/A) CHEST TUBE  INSERTION (Left)  1 steady progress, afebrile without leukocytosis- not currently on abx, zinacef ended yesterday 2 H/H approaching transfusion threshhold, - cont iron supp 3 repeat bmet in am 4 sugars controlled   LOS: 11 days    Toye Rouillard E 09/11/2015

## 2015-09-11 NOTE — Progress Notes (Signed)
Utilization review completed.  

## 2015-09-11 NOTE — Progress Notes (Signed)
Physical Therapy Treatment Patient Details Name: [name] [name] MRN: [id number] DOB: 1940/11/11 Today's Date: 2015-10-07    History of Present Illness pt is a 75 y/o female with h/o HTN and very recent CABG, admitted with upper sternotomy incision infection, s/p I and D with VAC placement.  S/p return to OR 09/08/15 for Sternal Wound Irrigation and muscle flap closure using R pectoralis rotational flap and L chest tube placement.    PT Comments    Patient mobilizing better today.  Reviewed plan for home and equipment.  No DME needs, still feel HHPT appropriate.  Follow Up Recommendations  Home health PT;Supervision for mobility/OOB     Equipment Recommendations  None recommended by PT    Recommendations for Other Services       Precautions / Restrictions Precautions Precautions: Sternal Precaution Comments: vac    Mobility  Bed Mobility             Sit to sidelying: Supervision General bed mobility comments: mobilizing well with pillow an precautions no cues  Transfers Overall transfer level: Needs assistance Equipment used: Rolling walker (2 wheeled) Transfers: Sit to/from Stand Sit to Stand: Supervision         General transfer comment: cues for positioing  Ambulation/Gait Ambulation/Gait assistance: Supervision Ambulation Distance (Feet): 250 Feet   Gait Pattern/deviations: Step-through pattern;Decreased stride length     General Gait Details: mobilizing without physical help but assist for IV and vac lines   Stairs Stairs: Yes Stairs assistance: Min guard Stair Management: One rail Left;Step to pattern;Sideways Number of Stairs: 2 General stair comments: limited due to lines  Wheelchair Mobility    Modified Rankin (Stroke Patients Only)       Balance Overall balance assessment: Needs assistance         Standing balance support: Bilateral upper extremity supported Standing balance-Leahy Scale: Poor Standing balance comment: UE support  needed for balance                    Cognition Arousal/Alertness: Awake/alert Behavior During Therapy: WFL for tasks assessed/performed Overall Cognitive Status: Within Functional Limits for tasks assessed                      Exercises      General Comments General comments (skin integrity, edema, etc.): EHR 121      Pertinent Vitals/Pain Faces Pain Scale: Hurts little more Pain Location: chest Pain Descriptors / Indicators: Sore Pain Intervention(s): Monitored during session;Repositioned    Home Living                      Prior Function            PT Goals (current goals can now be found in the care plan section) Progress towards PT goals: Progressing toward goals    Frequency  Min 3X/week    PT Plan Current plan remains appropriate    Co-evaluation             End of Session   Activity Tolerance: Patient tolerated treatment well Patient left: in bed;with call bell/phone within reach     Time: 1020-1045 PT Time Calculation (min) (ACUTE ONLY): 25 min  Charges:  $Gait Training: 8-22 mins $Therapeutic Activity: 8-22 mins                    G Codes:      Reginia Naas 10-07-15, 12:44 PM  Magda Kiel, [place] 10-07-15

## 2015-09-11 NOTE — Progress Notes (Signed)
CARDIAC REHAB PHASE I   Second attempt to ambulate with pt today. Pt sound asleep in bed. Spoke with RN, states pt does not feel well today. Pt has ambulated twice today. Will follow-up tomorrow.   Lenna Sciara, RN, BSN 09/11/2015 2:29 PM

## 2015-09-12 LAB — GLUCOSE, CAPILLARY
Glucose-Capillary: 93 mg/dL (ref 65–99)
Glucose-Capillary: 90 mg/dL (ref 65–99)
Glucose-Capillary: 96 mg/dL (ref 65–99)
Glucose-Capillary: 104 mg/dL — ABNORMAL HIGH (ref 65–99)

## 2015-09-12 MED ORDER — SODIUM CHLORIDE 0.9 % IV SOLN
510.0000 mg | Freq: Once | INTRAVENOUS | Status: AC
  Administered 2015-09-12: 510 mg via INTRAVENOUS
  Filled 2015-09-12: qty 17

## 2015-09-12 MED ORDER — FUROSEMIDE 20 MG PO TABS
20.0000 mg | ORAL_TABLET | Freq: Every day | ORAL | Status: DC
  Administered 2015-09-12 – 2015-09-14 (×3): 20 mg via ORAL
  Filled 2015-09-12 (×3): qty 1

## 2015-09-12 NOTE — Progress Notes (Signed)
4 Days Post-Op  Subjective: Patient reports she is feeling better and mobilizing OOB more with PT and to chair and BSC.  The JP drains have minimal OP and fluid is serosanguinous.  The medial JP drain was removed without difficulty.  The incisional VAC from the midline sternal incision was removed and Mepelix border dressing was placed over the area. She tolerated this well.  The incision is clean, dry and intact.   Objective: Vital signs in last 24 hours: Temp:  [97.8 F (36.6 C)-98.6 F (37 C)] 97.8 F (36.6 C) (02/17 0356) Pulse Rate:  [85-96] 89 (02/17 0356) Resp:  [18-20] 20 (02/17 0356) BP: (126-135)/(58-77) 127/67 mmHg (02/17 0356) SpO2:  [96 %-100 %] 96 % (02/17 0356) Weight:  [81.557 kg (179 lb 12.8 oz)] 81.557 kg (179 lb 12.8 oz) (02/17 0356) Last BM Date: 09/11/15  Intake/Output from previous day: 02/16 0701 - 02/17 0700 In: 88 [P.O.:810; I.V.:10] Out: 740 [Urine:700; Drains:40] Intake/Output this shift: Total I/O In: -  Out: 15 [Drains:15]  General appearance: alert, cooperative and no distress The sternal incision is clean, dry and intact and without signs of infection.  Lab Results:   Recent Labs  09/10/15 0614 09/11/15 0520  WBC 11.5* 9.7  HGB 7.6* 7.3*  HCT 24.9* 23.2*  PLT 264 293   BMET  Recent Labs  09/10/15 0614  NA 138  K 3.4*  CL 105  CO2 26  GLUCOSE 111*  BUN 13  CREATININE 1.30*  CALCIUM 8.0*   PT/INR No results for input(s): LABPROT, INR in the last 72 hours. ABG No results for input(s): PHART, HCO3 in the last 72 hours.  Invalid input(s): PCO2, PO2  Studies/Results: No results found.  Anti-infectives: Anti-infectives    Start     Dose/Rate Route Frequency Ordered Stop   09/10/15 1000  cefUROXime (ZINACEF) 1.5 g in dextrose 5 % 50 mL IVPB     1.5 g 100 mL/hr over 30 Minutes Intravenous Every 12 hours 09/10/15 0842 09/10/15 2249   09/08/15 1400  cefUROXime (ZINACEF) 1.5 g in dextrose 5 % 50 mL IVPB     1.5 g 100  mL/hr over 30 Minutes Intravenous Every 12 hours 09/08/15 1258 09/09/15 1055   09/08/15 0822  polymyxin B 500,000 Units, bacitracin 50,000 Units in sodium chloride irrigation 0.9 % 500 mL irrigation  Status:  Discontinued       As needed 09/08/15 0822 09/08/15 1048   09/08/15 0700  ceFAZolin (ANCEF) IVPB 2 g/50 mL premix  Status:  Discontinued     2 g 100 mL/hr over 30 Minutes Intravenous To ShortStay Surgical 09/05/15 0824 09/08/15 1214   09/08/15 0600  cefUROXime (ZINACEF) 1.5 g in dextrose 5 % 50 mL IVPB  Status:  Discontinued     1.5 g 100 mL/hr over 30 Minutes Intravenous 60 min pre-op 09/07/15 1618 09/07/15 1619   09/07/15 1415  piperacillin-tazobactam (ZOSYN) IVPB 3.375 g  Status:  Discontinued     3.375 g 12.5 mL/hr over 240 Minutes Intravenous 3 times per day 09/07/15 1402 09/08/15 1258   09/07/15 0600  vancomycin (VANCOCIN) IVPB 1000 mg/200 mL premix  Status:  Discontinued     1,000 mg 200 mL/hr over 60 Minutes Intravenous Every 48 hours 09/07/15 0517 09/08/15 1258   09/06/15 1400  piperacillin-tazobactam (ZOSYN) IVPB 2.25 g  Status:  Discontinued     2.25 g 100 mL/hr over 30 Minutes Intravenous 3 times per day 09/06/15 0805 09/07/15 1402   09/04/15 1106  vancomycin (  VANCOCIN) powder  Status:  Discontinued       As needed 09/04/15 1106 09/04/15 1220   09/01/15 1930  vancomycin (VANCOCIN) IVPB 1000 mg/200 mL premix  Status:  Discontinued     1,000 mg 200 mL/hr over 60 Minutes Intravenous Every 12 hours 09/01/15 1127 09/05/15 1953   09/01/15 1400  vancomycin (VANCOCIN) 1,250 mg in sodium chloride 0.9 % 250 mL IVPB  Status:  Discontinued     1,250 mg 166.7 mL/hr over 90 Minutes Intravenous Every 24 hours 08/31/15 2043 09/01/15 1026   09/01/15 1400  piperacillin-tazobactam (ZOSYN) IVPB 3.375 g  Status:  Discontinued     3.375 g 12.5 mL/hr over 240 Minutes Intravenous 3 times per day 09/01/15 1024 09/06/15 0805   09/01/15 0829  vancomycin (VANCOCIN) powder  Status:  Discontinued        As needed 09/01/15 0830 09/01/15 0910   09/01/15 0800  piperacillin-tazobactam (ZOSYN) IVPB 3.375 g  Status:  Discontinued     3.375 g 100 mL/hr over 30 Minutes Intravenous STAT 09/01/15 0751 09/02/15 0756   09/01/15 0800  vancomycin (VANCOCIN) 1,000 mg in sodium chloride 0.9 % 1,000 mL irrigation      Irrigation To Surgery 09/01/15 0759 09/01/15 0813   09/01/15 0745  piperacillin-tazobactam (ZOSYN) IVPB 3.375 g  Status:  Discontinued     3.375 g 12.5 mL/hr over 240 Minutes Intravenous 4 times per day 09/01/15 0737 09/01/15 1023   09/01/15 0745  vancomycin (VANCOCIN) 1,250 mg in sodium chloride 0.9 % 250 mL IVPB     1,250 mg 166.7 mL/hr over 90 Minutes Intravenous To Surgery 09/01/15 0742 09/02/15 0745   09/01/15 0726  vancomycin (VANCOCIN) 1 GM/200ML IVPB    Comments:  Theodoro Grist   : cabinet override      09/01/15 0726 09/01/15 1929   09/01/15 0726  dextrose 5 % with cefUROXime (ZINACEF) ADS Med    Comments:  Theodoro Grist   : cabinet override      09/01/15 0726 09/01/15 0744   09/01/15 0723  polymyxin B 500,000 Units, bacitracin 50,000 Units in sodium chloride irrigation 0.9 % 500 mL irrigation  Status:  Discontinued       As needed 09/01/15 0724 09/01/15 0910   08/31/15 2045  vancomycin (VANCOCIN) IVPB 750 mg/150 ml premix  Status:  Discontinued     750 mg 150 mL/hr over 60 Minutes Intravenous Every 12 hours 08/31/15 2036 08/31/15 2037   08/31/15 1645  vancomycin (VANCOCIN) IVPB 1000 mg/200 mL premix     1,000 mg 200 mL/hr over 60 Minutes Intravenous  Once 08/31/15 1638 08/31/15 1911      Assessment/Plan: s/p Procedure(s): STERNAL WOUND DEBRIDEMENT (N/A) Muscle FLAP (N/A) CHEST TUBE INSERTION (Left) Medial JP drain removed.   Incisional VAC dressing removed.  Mepelix border dressing to the incision.  4 x 4 gauze to the drain site.  Will need to follow up in the office for lateral JP drain removal.    LOS: 12 days    Leitha Hyppolite,PA-C Plastic  Surgery [phone]

## 2015-09-12 NOTE — Progress Notes (Signed)
   09/12/15 1500  Mobility  Activity Ambulate in hall  Level of Assistance Modified independent, requires aide device or extra time  Assistive Device Front wheel walker  Distance Ambulated (ft) 150 ft  Ambulation Response Tolerated well

## 2015-09-12 NOTE — Progress Notes (Addendum)
      [address]       [place] [address]             [phone]        4 Days Post-Op Procedure(s) (LRB): STERNAL WOUND DEBRIDEMENT (N/A) Muscle FLAP (N/A) CHEST TUBE INSERTION (Left)  Subjective: Patient in good spirits this am.  Objective: Vital signs in last 24 hours: Temp:  [97.8 F (36.6 C)-98.6 F (37 C)] 97.8 F (36.6 C) (02/17 0356) Pulse Rate:  [85-96] 89 (02/17 0356) Cardiac Rhythm:  [-] Heart block;Bundle branch block (02/16 1900) Resp:  [18-20] 20 (02/17 0356) BP: (126-135)/(58-77) 127/67 mmHg (02/17 0356) SpO2:  [96 %-100 %] 96 % (02/17 0356) Weight:  [179 lb 12.8 oz (81.557 kg)] 179 lb 12.8 oz (81.557 kg) (02/17 0356)   Current Weight  09/12/15 179 lb 12.8 oz (81.557 kg)      Intake/Output from previous day: 02/16 0701 - 02/17 0700 In: 85 [P.O.:810; I.V.:10] Out: 740 [Urine:700; Drains:40]   Physical Exam:  Cardiovascular: RRR Pulmonary: Clear to auscultation bilaterally; no rales, wheezes, or rhonchi. Abdomen: Soft, non tender, bowel sounds present. Extremities: Mild bilateral lower extremity edema. Wounds: VAC removed. Sternal wound is clean and dry and appears to be healing very well. Sero sanguinous output from drains  Lab Results: CBC: Recent Labs  09/10/15 0614 09/11/15 0520  WBC 11.5* 9.7  HGB 7.6* 7.3*  HCT 24.9* 23.2*  PLT 264 293   BMET:  Recent Labs  09/10/15 0614  NA 138  K 3.4*  CL 105  CO2 26  GLUCOSE 111*  BUN 13  CREATININE 1.30*  CALCIUM 8.0*    PT/INR:  Lab Results  Component Value Date   INR 1.24 09/07/2015   INR 1.42 08/14/2015   INR 1.15 08/11/2015   ABG:  INR: Will add last result for INR, ABG once components are confirmed Will add last 4 CBG results once components are confirmed  Assessment/Plan:  1. CV - SR in the 80-90's. On Lopressor 50 mg bid 2.  Anemia-H and H yesterday 7.3 and 23.2. Continue Trinsicon. 3. Sternal wound infection-VAC removed. New dressing  (Mepelix) applied. One drain removed as well. Remaining drain will be removed in Dr. Eusebio Friendly office after discharge. 4. Hopefully, home in 1-2 days  ZIMMERMAN,DONIELLE MPA-C 09/12/2015,7:57 AM  Plan discharge Sunday--DC PICC line before discharge. IV iron ordered for low hemoglobin Patient will need oral Lasix 20 mg daily for week patient examined and medical record reviewed,agree with above note. [name] III 09/12/2015

## 2015-09-12 NOTE — Discharge Summary (Signed)
Physician Discharge Summary       [address]       [place] [address]             [phone]    Patient ID: [name] [name] MRN: [id number] DOB/AGE: 1940/09/11 75 y.o.  Admit date: 08/31/2015 Discharge date: 09/14/2015  Admission Diagnosis: Sternal wound infection  Active Diagnoses:  1. CAD (s/p CABG x 3 on 08/14/2015) 2. Essential hypertension 3. Acute myocardial infarction ([place]) 4. Left pleural effusion  Consults: Plastic surgery  Procedure (s):  1. Debridement and irrigation of superficial sternal wound infection (excisional debridement).  2. Placement of wound VAC, wound system by Dr. Prescott Gum on [id number]. 3. Application of ACell powder, 1.5 g with coverage using a sheet of Sorbact by Dr. Prescott Gum on 09/04/2015. 4.Pectoralis major muscle turnover flap for repair of sternal wound with VAC placement by Dr. Marla Roe on 09/08/2015. Soft tissue advancement of the left breast 15 x 4 cm. 5.Left chest tube placement by Dr. Prescott Gum on 09/08/2015  History of Presenting Illness: This is a 75 year old African American female who is  s/p cabg x 3  08/14/2015 by Dr. Prescott Gum. She had been doing well post op until 02/04/2017when she had some swelling at top of incision, but no fever or chills, On 08/31/2015, the sternal wound was more swollen, was red, and had drainage. She went to [facility] ER. CT showed large retrosternal abscess extending into the sternal notch, pericardial thickening but no pericardial effusion, small bilateral pleural effusions, and bibasilar atelectasis. She was transferred to [facility] by EMS for further evaluation and treatment.  Brief Hospital Course:  The sternal wound was opened at the bedside and cultures were obtained on 02/05. She was put on Vancomycin and was dosed by pharmacy. She remained afebrile and hemodynamically stable. She underwent several surgeries, the first of which was debridement, irrigation, and placement of wound VAC.  Zosyn was added. Wound cultures remained negative. Wound VAC was changed every other day. She then underwent further debridement, application of A cell powder, and changing of wound VAC on 09/04/2015. Dr. Marla Roe from plastic surgery was then consulted. She did a pectoralis major muscle turnover flap, repair of sternal wound, and VAC placement on 09/08/2015. She developed a left pleural effusion and required a chest tube placement on 09/08/2015. This was removed on 09/10/2015. She was stable for transfer from the ICU to 2000 for further convalescence on 09/10/2015. JP drains have had sero sanguinous output and the amount is gradually decreasing. One JP was removed 02/17. Wound VAC was also removed on 02/17. The sternal wound is clean and dry. No signs of infection. Mepelix was applied border was applied. She is tolerating a diet. She is ambulating on room air. She is felt surgically stable for discharge.  Latest Vital Signs: Blood pressure 143/80, pulse 89, temperature 98.2 F (36.8 C), temperature source Oral, resp. rate 18, height 5\' 10"  (1.778 m), weight 178 lb (80.74 kg), SpO2 97 %.  Physical Exam: Cardiovascular: RRR Pulmonary: Clear to auscultation bilaterally; no rales, wheezes, or rhonchi. Abdomen: Soft, non tender, bowel sounds present. Extremities: Mild bilateral lower extremity edema. Wounds: VAC removed. Sternal wound is clean and dry and appears to be healing very well. Sero sanguinous output from drains  Discharge Condition:Stable and discharged to home  Recent laboratory studies:  Lab Results  Component Value Date   WBC 8.3 09/14/2015   HGB 7.4* 09/14/2015   HCT 23.9* 09/14/2015  MCV 87.5 09/14/2015   PLT 392 09/14/2015   Lab Results  Component Value Date   NA 141 09/14/2015   K 3.6 09/14/2015   CL 108 09/14/2015   CO2 22 09/14/2015   CREATININE 0.99 09/14/2015   GLUCOSE 110* 09/14/2015    Diagnostic Studies:  Ct Chest W Contrast  08/31/2015  CLINICAL DATA:  CABG  08/14/2015. Now with redness and drainage from incision. EXAM: CT CHEST WITH CONTRAST TECHNIQUE: Multidetector CT imaging of the chest was performed during intravenous contrast administration. CONTRAST:  61mL OMNIPAQUE IOHEXOL 300 MG/ML  SOLN COMPARISON:  Chest two view 08/19/2015 FINDINGS: Extensive retrosternal fluid collection with rim enhancing wall consistent with abscess. This is anterior to the pericardium and posterior to the sternum. Fluid extends into the sternal notch were there is a 2 cm fluid collection containing gas bubbles. Findings are consistent with abscess. The retrosternal fluid collection measures approximately 19 x 53 mm on transverse images. There is thickening of the pericardium but no pericardial effusion. Coronary calcification. Heart size normal. Small bilateral pleural effusions with dependent atelectasis in both lung bases. Negative for pneumonia. Negative for mass or adenopathy. No thoracic spine fracture. IMPRESSION: Large retrosternal abscess extending into the sternal notch. Pericardial thickening without pericardial effusion Small bilateral pleural effusions with bibasilar atelectasis. These results were called by telephone at the time of interpretation on 08/31/2015 at 5:39 pm to Dr. Julianne Rice , who verbally acknowledged these results. Electronically Signed   By: Franchot Gallo M.D.   On: 08/31/2015 17:39   Dg Chest Port 1 View  09/10/2015  CLINICAL DATA:  Sternal wound debridement for infection, chest tube insertion. EXAM: PORTABLE CHEST 1 VIEW COMPARISON:  Portable chest x-ray of September 09, 2015 FINDINGS: The lungs are well-expanded. A small amount of pleural fluid blunts the costophrenic angles. There is mild subsegmental atelectasis at both lung bases. There is no pneumothorax or pneumomediastinum. The heart is top-normal in size. The pulmonary vascularity is not engorged. There is mild widening of the mediastinum. A longitudinally oriented new mediastinal drainage  catheter has its tip projecting over the T4 vertebral body. The left-sided chest tube tip projects over the posterior medial aspect of the seventh rib. A right-sided PICC line tip projects over the midportion of the SVC. There is an additional tubular structure that projects over the right axillary region which is stable. IMPRESSION: 1. The chest and mediastinal drainage tubes are in stable position. Mild mediastinal widening persists. 2. Persistent bibasilar subsegmental atelectasis. Traces of pleural fluid blunt the costophrenic angles. No pneumothorax or pneumomediastinum. Electronically Signed   By: David  Martinique M.D.   On: 09/10/2015 07:55   Discharge Medications:   Medication List    TAKE these medications        ALPRAZolam 0.25 MG tablet  Commonly known as:  XANAX  Take 0.25 mg by mouth as needed. .25     aspirin 325 MG EC tablet  Take 1 tablet (325 mg total) by mouth daily.     atorvastatin 80 MG tablet  Commonly known as:  LIPITOR  Take 1 tablet (80 mg total) by mouth daily at 6 PM.     DOCU SOFT 100 MG capsule  Generic drug:  docusate sodium  Take 100 mg by mouth 2 (two) times daily.     feeding supplement (ENSURE ENLIVE) Liqd  Take 237 mLs by mouth 3 (three) times daily with meals.     feeding supplement Liqd  Take 1 Container by mouth 3 (three)  times daily with meals.     ferrous [id number] C-folic acid capsule  Commonly known as:  TRINSICON / FOLTRIN  Take 1 capsule by mouth 2 (two) times daily.     furosemide 20 MG tablet  Commonly known as:  LASIX  Take 1 tablet (20 mg total) by mouth daily as needed. Take with Potassium     losartan 25 MG tablet  Commonly known as:  COZAAR  Take 1 tablet (25 mg total) by mouth daily.     metoprolol 50 MG tablet  Commonly known as:  LOPRESSOR  Take 1.5 tablets (75 mg total) by mouth 2 (two) times daily.     oxyCODONE 5 MG immediate release tablet  Commonly known as:  Oxy IR/ROXICODONE  Take 1 tablet (5 mg total)  by mouth every 4 (four) hours as needed for moderate pain or severe pain.     oxyCODONE 5 MG immediate release tablet  Commonly known as:  Oxy IR/ROXICODONE  Take 1 tablet (5 mg total) by mouth every 4 (four) hours as needed for moderate pain.     potassium chloride SA 20 MEQ tablet  Commonly known as:  K-DUR,KLOR-CON  Take 1 tablet (20 mEq total) by mouth daily.        Follow Up Appointments: Follow-up Information    Follow up with Wallace Going, DO In 1 week.   Specialty:  Plastic Surgery   Contact information:   [place] [address] [phone]       Follow up with [name] III, MD On 10/01/2015.   Specialty:  Cardiothoracic Surgery   Why:  PA/LAT CXR to be taken (at [facility] which is in the same building as Dr. Lucianne Lei Trigt's office) on 10/01/2015 at 10:15 am ;Appointment time is at 11:00 am   Contact information:   [place] [address] [phone]       Follow up with [facility].   Why:  HH-RN arranged will resume HH-PT   Contact information:   [place] [address] [phone]       Signed: Cinda Quest 09/14/2015, 8:00 AM  patient examined and medical record reviewed,agree with above note. [name] III 09/14/2015

## 2015-09-12 NOTE — Progress Notes (Signed)
CARDIAC REHAB PHASE I   PRE:  Rate/Rhythm: 84 SR  BP:  Supine: 137/77  Sitting:   Standing:    SaO2: 97%RA  MODE:  Ambulation: 300 ft   POST:  Rate/Rhythm: 120 ST  BP:  Supine: 148/85  Sitting:   Standing:    SaO2: 95%RA 1326-1352 Pt walked 300 ft on RA with rolling walker and minimal asst. Tolerated well. Back to bed after walk. Second walk for today.   Graylon Good, RN BSN  09/12/2015 1:47 PM

## 2015-09-13 LAB — GLUCOSE, CAPILLARY
Glucose-Capillary: 108 mg/dL — ABNORMAL HIGH (ref 65–99)
Glucose-Capillary: 133 mg/dL — ABNORMAL HIGH (ref 65–99)
Glucose-Capillary: 102 mg/dL — ABNORMAL HIGH (ref 65–99)
Glucose-Capillary: 120 mg/dL — ABNORMAL HIGH (ref 65–99)

## 2015-09-13 MED ORDER — LOSARTAN POTASSIUM 25 MG PO TABS
25.0000 mg | ORAL_TABLET | Freq: Every day | ORAL | Status: DC
  Administered 2015-09-13 – 2015-09-14 (×2): 25 mg via ORAL
  Filled 2015-09-13 (×2): qty 1

## 2015-09-13 NOTE — Progress Notes (Signed)
CARDIAC REHAB PHASE I   PRE:  Rate/Rhythm: 99  BP:  Sitting: 133/74     SaO2: 99% RA  MODE:  Ambulation: 350 ft   POST:  Rate/Rhythem: 132 sinus tach with PVC  BP:  Sitting: 149/99     SaO2: 95% RA  Pt ambulated 350 ft with assist x1 using rolling walker.  Pt tolerated walk well and increase distance by 50 ft today, and this was her second walk.  Pt returned to bed after walk with call bell in reach.  We will f/u Monday if still here.  She is eager to go home and go to [facility]. Alberteen Sam, MA, ACSM RCEP [phone]  Clotilde Dieter

## 2015-09-13 NOTE — Progress Notes (Addendum)
      [address]       [place] [address]             [phone]      5 Days Post-Op Procedure(s) (LRB): STERNAL WOUND DEBRIDEMENT (N/A) Muscle FLAP (N/A) CHEST TUBE INSERTION (Left)   Subjective:  [name] [name] has no new complaints this morning.  Her left arm remains swollen, but she states it has improved since starting Lasix.  Objective: Vital signs in last 24 hours: Temp:  [97.8 F (36.6 C)-98.4 F (36.9 C)] 97.8 F (36.6 C) (02/18 0515) Pulse Rate:  [88-98] 96 (02/18 0515) Cardiac Rhythm:  [-] Normal sinus rhythm (02/17 1947) Resp:  [18] 18 (02/18 0515) BP: (137-154)/(73-85) 154/85 mmHg (02/18 0515) SpO2:  [96 %-97 %] 97 % (02/18 0515) Weight:  [178 lb (80.74 kg)] 178 lb (80.74 kg) (02/18 0418)  Intake/Output from previous day: 02/17 0701 - 02/18 0700 In: 920 [P.O.:920] Out: 1115 [Urine:1100; Drains:15]  General appearance: alert, cooperative and no distress Heart: regular rate and rhythm Lungs: clear to auscultation bilaterally Abdomen: soft, non-tender; bowel sounds normal; no masses,  no organomegaly Extremities: LUE mild edema, no erythema present Wound: dressing in place  Lab Results:  Recent Labs  09/11/15 0520  WBC 9.7  HGB 7.3*  HCT 23.2*  PLT 293   BMET: No results for input(s): NA, K, CL, CO2, GLUCOSE, BUN, CREATININE, CALCIUM in the last 72 hours.  PT/INR: No results for input(s): LABPROT, INR in the last 72 hours. ABG    Component Value Date/Time   PHART 7.461* 09/07/2015 1740   HCO3 24.0 09/07/2015 1740   TCO2 25 09/07/2015 1740   O2SAT 94.0 09/07/2015 1740   CBG (last 3)   Recent Labs  09/12/15 1622 09/12/15 2033 09/13/15 0648  GLUCAP 96 104* 108*    Assessment/Plan: S/P Procedure(s) (LRB): STERNAL WOUND DEBRIDEMENT (N/A) Muscle FLAP (N/A) CHEST TUBE INSERTION (Left)  1. CV-NSR, HTN- continue Lopressor, will restart home Cozaar 2. Anemia- treated with IV iron supplement yesterday, will repeat labs in  AM 3. Sternal- wound- vac removed, leave remaining drain to be removed as outpatient, will replace dressing tomorrow prior to discharge 4. Dispo- patient stable, will plan to d/c home in AM   LOS: 13 days    [name] [name] 09/13/2015 patient examined and medical record reviewed,agree with above note. [name] III 09/14/2015

## 2015-09-13 NOTE — Progress Notes (Signed)
POD # 5 right pectoralis turnover flap  Temp:  [97.8 F (36.6 C)-98.4 F (36.9 C)] 97.8 F (36.6 C) (02/18 0515) Pulse Rate:  [88-98] 96 (02/18 0515) Resp:  [18] 18 (02/18 0515) BP: (137-154)/(73-85) 154/85 mmHg (02/18 0515) SpO2:  [96 %-97 %] 97 % (02/18 0515) Weight:  [80.74 kg (178 lb)] 80.74 kg (178 lb) (02/18 0418)   Alert, no c/o  PE sternal incision dry, flat, intact JP lateral with dark serosanguinous drainage Medial drain site that was removed yesterday dressing saturated-this was changed  A/P Plan home tomorrow. Will leave remaining drain in for now- counseled to sponge bathe until follow up. Change sternal dressing every other day. F/u Dr. Marla Roe in a week.  Irene Limbo, MD [facility] [phone]

## 2015-09-14 LAB — CBC
HCT: 23.9 % — ABNORMAL LOW (ref 36.0–46.0)
Hemoglobin: 7.4 g/dL — ABNORMAL LOW (ref 12.0–15.0)
MCH: 27.1 pg (ref 26.0–34.0)
MCHC: 31 g/dL (ref 30.0–36.0)
MCV: 87.5 fL (ref 78.0–100.0)
PLATELETS: 392 10*3/uL (ref 150–400)
RBC: 2.73 MIL/uL — ABNORMAL LOW (ref 3.87–5.11)
RDW: 15.3 % (ref 11.5–15.5)
WBC: 8.3 10*3/uL (ref 4.0–10.5)

## 2015-09-14 LAB — BASIC METABOLIC PANEL
Anion gap: 11 (ref 5–15)
BUN: 10 mg/dL (ref 6–20)
CALCIUM: 8.7 mg/dL — AB (ref 8.9–10.3)
CO2: 22 mmol/L (ref 22–32)
Chloride: 108 mmol/L (ref 101–111)
Creatinine, Ser: 0.99 mg/dL (ref 0.44–1.00)
GFR calc Af Amer: >60 mL/min (ref >60)
GFR, EST NON AFRICAN AMERICAN: 55 mL/min — AB (ref >60)
Glucose, Bld: 110 mg/dL — ABNORMAL HIGH (ref 65–99)
Potassium: 3.6 mmol/L (ref 3.5–5.1)
SODIUM: 141 mmol/L (ref 135–145)

## 2015-09-14 LAB — GLUCOSE, CAPILLARY: Glucose-Capillary: 99 mg/dL (ref 65–99)

## 2015-09-14 MED ORDER — BOOST / RESOURCE BREEZE PO LIQD: 237.0000 mL | Freq: Three times a day (TID) | ORAL | Status: DC

## 2015-09-14 MED ORDER — ENSURE ENLIVE PO LIQD: 237.0000 mL | Freq: Three times a day (TID) | ORAL | Status: DC

## 2015-09-14 MED ORDER — SODIUM CHLORIDE 0.9 % IV SOLN: 510.0000 mg | Freq: Once | INTRAVENOUS | Status: DC

## 2015-09-14 MED ORDER — OXYCODONE HCL 5 MG PO TABS: 5.0000 mg | ORAL_TABLET | ORAL | Status: DC | PRN

## 2015-09-14 MED ORDER — FE FUMARATE-B12-VIT C-FA-IFC PO CAPS: 1.0000 | ORAL_CAPSULE | Freq: Two times a day (BID) | ORAL | Status: DC

## 2015-09-14 NOTE — Progress Notes (Addendum)
      [address]       [place] [address]             [phone]      6 Days Post-Op Procedure(s) (LRB): STERNAL WOUND DEBRIDEMENT (N/A) Muscle FLAP (N/A) CHEST TUBE INSERTION (Left)   Subjective:  No complaints.  Happy to be going home.  She asks if home health can come to her home to assist with dressing changes.  Objective: Vital signs in last 24 hours: Temp:  [97.9 F (36.6 C)-98.2 F (36.8 C)] 98.2 F (36.8 C) (02/19 0400) Pulse Rate:  [89-95] 89 (02/19 0400) Cardiac Rhythm:  [-] Normal sinus rhythm;Heart block (02/18 1940) Resp:  [18] 18 (02/18 1900) BP: (131-143)/(74-80) 143/80 mmHg (02/19 0400) SpO2:  [95 %-99 %] 97 % (02/19 0400)  Intake/Output from previous day: 02/18 0701 - 02/19 0700 In: 360 [P.O.:360] Out: 19 [Drains:19]  General appearance: alert, cooperative and no distress Heart: regular rate and rhythm Lungs: clear to auscultation bilaterally Abdomen: soft, non-tender; bowel sounds normal; no masses,  no organomegaly Wound: clean and dry  Lab Results:  Recent Labs  09/14/15 0654  WBC 8.3  HGB 7.4*  HCT 23.9*  PLT 392   BMET: No results for input(s): NA, K, CL, CO2, GLUCOSE, BUN, CREATININE, CALCIUM in the last 72 hours.  PT/INR: No results for input(s): LABPROT, INR in the last 72 hours. ABG    Component Value Date/Time   PHART 7.461* 09/07/2015 1740   HCO3 24.0 09/07/2015 1740   TCO2 25 09/07/2015 1740   O2SAT 94.0 09/07/2015 1740   CBG (last 3)   Recent Labs  09/13/15 1717 09/13/15 2147 09/14/15 0625  GLUCAP 102* 120* 99    Assessment/Plan: S/P Procedure(s) (LRB): STERNAL WOUND DEBRIDEMENT (N/A) Muscle FLAP (N/A) CHEST TUBE INSERTION (Left)  1. CV- NSR, HTN stable- continue Lopressor, Cozaar 2. Anemia- Hgb remains stable, will continue iron supplementation at discharge 3. Sternal wound- looks good, drain remains in place with blood serosanguinous drainage- will leave with plans to remove in one week  by Plastic 4. Dispo- patient is stable, will d/c home today, will arrange home health   LOS: 14 days    BARRETT, ERIN 09/14/2015  patient examined and medical record reviewed,agree with above note. [name] III 09/14/2015

## 2015-09-14 NOTE — Progress Notes (Signed)
CM received call from PA requesting a change in [facility] dressing changes.  Orders and face to face placed.  CM called [facility] rep, Tiffany to notify of discharge today.  [facility] to render HHPT/RN services.  No other CM needs were communicated.

## 2015-09-17 ENCOUNTER — Encounter: Payer: Medicare PPO | Admitting: Cardiothoracic Surgery

## 2015-09-17 ENCOUNTER — Encounter (INDEPENDENT_AMBULATORY_CARE_PROVIDER_SITE_OTHER): Payer: Self-pay | Admitting: Internal Medicine

## 2015-09-23 ENCOUNTER — Telehealth: Payer: Self-pay | Admitting: Adult Health

## 2015-09-23 NOTE — Telephone Encounter (Signed)
Spoke to pt's daughter and she stated that the pt has had several surgeries lately and was last released from the hospital on 2/19. The past few times that the home health nurse came out her blood pressure has been up, so they were told to contact our office to see what needed to be done as far as her bp medications. They last few readings are: 148/96,160/100 & 161/106. Please advise.

## 2015-09-23 NOTE — Telephone Encounter (Signed)
Patient need to be seen before medication adjustments. Cannot advise until assessed.

## 2015-09-23 NOTE — Telephone Encounter (Signed)
Pt's BP is 161/106 today and they're wondering what she needs to do

## 2015-09-24 ENCOUNTER — Telehealth: Payer: Self-pay | Admitting: *Deleted

## 2015-09-24 ENCOUNTER — Ambulatory Visit: Payer: Medicare PPO | Admitting: Physician Assistant

## 2015-09-24 DIAGNOSIS — I10 Essential (primary) hypertension: Secondary | ICD-10-CM

## 2015-09-24 MED ORDER — LOSARTAN POTASSIUM 25 MG PO TABS: 50.0000 mg | ORAL_TABLET | Freq: Every day | ORAL | Status: DC

## 2015-09-24 NOTE — Telephone Encounter (Signed)
A call was received yesterday regarding [name] [name]'s elevated blood pressure.  I advised them to call her cardiologist in [place] and they agreed.  I received a call from the therapist today regarding same, thinking she had called the cardiologist office. Then her daughter has called frustrated that she can not be seen until Friday. Her readings are 148/98. 160/106, 150/100, 148/94.  I consulted with Dr. Prescott Gum, her surgeon, and he ordered that she increase her Losartan from 25 to 50 mg a day and she agreed.  She was appreciative for our help.

## 2015-09-24 NOTE — Telephone Encounter (Signed)
Pt has an appt with KL on Fri.

## 2015-09-24 NOTE — Telephone Encounter (Signed)
Tried to get in touch with daughter ( Ms. Annamaria Boots) but she did not answer, nor did a voicemail pick up. Will try to call again later.

## 2015-09-26 ENCOUNTER — Ambulatory Visit (INDEPENDENT_AMBULATORY_CARE_PROVIDER_SITE_OTHER): Payer: Medicare PPO | Admitting: Adult Health

## 2015-09-26 ENCOUNTER — Ambulatory Visit: Payer: Medicare PPO | Admitting: Adult Health

## 2015-09-26 ENCOUNTER — Encounter: Payer: Self-pay | Admitting: Adult Health

## 2015-09-26 VITALS — BP 140/76 | HR 82 | Ht 70.0 in | Wt 165.0 lb

## 2015-09-26 DIAGNOSIS — I2510 Atherosclerotic heart disease of native coronary artery without angina pectoris: Secondary | ICD-10-CM

## 2015-09-26 DIAGNOSIS — R634 Abnormal weight loss: Secondary | ICD-10-CM | POA: Diagnosis not present

## 2015-09-26 DIAGNOSIS — I10 Essential (primary) hypertension: Secondary | ICD-10-CM | POA: Diagnosis not present

## 2015-09-26 MED ORDER — POTASSIUM CHLORIDE CRYS ER 20 MEQ PO TBCR: 20.0000 meq | EXTENDED_RELEASE_TABLET | Freq: Every day | ORAL | Status: DC

## 2015-09-26 NOTE — Progress Notes (Signed)
Cardiology Office Note   Date:  09/26/2015   ID:  Starlin, Panik 1941-04-20, MRN [id number]  PCP:  Lanette Hampshire, MD  Cardiologist:   [name], NP   No chief complaint on file.     History of Present Illness: [name] [name] is a 75 y.o. female who presents for ongoing assessment and management a CAD, status post three-vessel coronary artery bypass grafting in January 2017, hypertension, history of myocardial infarction, and left pleural effusion.  The patient was noted to have a superficial sternal wound infection with debridement and irrigation, placement of a wound VAC by Dr. [name] on 09/01/2015. The patient is here for medication adjustment and management, and refills.he apparently was experiencing episodes of hypertension, at home readings between 140/98-160/106.  Medications were increased by Dr.Van Tright who increased metoprolol  from 25 mg daily, and 50 mg daily.  This is completed on 09/24/2015.the patient is here for follow up to evaluate her response to medication.  She has a with her daughter.  Her daughter is a little upset about the slow response to her blood pressure medication adjustment.  Since increasing losartan to 50 mg daily, blood pressure is better controlled.  She continues with a Jackson-Pratt drain, with some serosanguineous fluid.  There is no evidence of infection or odor from her sternotomy site.  At this time.  She continues to have some soreness at the site.  Multiple questions are asked concerning her recovery, her medications, and how often she needs to take her blood pressure recording.  Past Medical History  Diagnosis Date  . Hypertension   . Anxiety about health 08/28/2015    Past Surgical History  Procedure Laterality Date  . Back surgery    . Abdominal hysterectomy    . Appendectomy    . Colonoscopy N/A 03/29/2013    Procedure: COLONOSCOPY;  Surgeon: Rogene Houston, MD;  [facility];  Service: Endoscopy;  Laterality: N/A;   930  . Cardiac catheterization N/A 08/10/2015    Procedure: Left Heart Cath and Coronary Angiography;  Surgeon: [name], MD;  Location: [place] [facility];  Service: Cardiovascular;  Laterality: N/A;  . Cardiac catheterization  08/10/2015    Procedure: Coronary Balloon Angioplasty;  Surgeon: [name], MD;  Location: [place] [facility];  Service: Cardiovascular;;  . Coronary artery bypass graft N/A 08/14/2015    Procedure: CORONARY ARTERY BYPASS GRAFTING (CABG)X3 LIMA-LAD; SVG-RAMUS; SVG-PD TRANSESOPHAGEAL ECHOCARDIOGRAM (TEE);  Surgeon: [name], MD;  Location: [place];  Service: Open Heart Surgery;  Laterality: N/A;  . Tee without cardioversion N/A 08/14/2015    Procedure: TRANSESOPHAGEAL ECHOCARDIOGRAM (TEE);  Surgeon: [name], MD;  Location: [place];  Service: Open Heart Surgery;  Laterality: N/A;  . Sternal wound debridement N/A 09/01/2015    Procedure: STERNAL WOUND DEBRIDEMENT;  Surgeon: [name], MD;  Location: [place];  Service: Thoracic;  Laterality: N/A;  . Sternal wound debridement N/A 09/04/2015    Procedure: STERNAL WOUND DEBRIDEMENT;  Surgeon: [name], MD;  Location: [place];  Service: Thoracic;  Laterality: N/A;  . Application of wound vac N/A 09/04/2015    Procedure: WOUND VAC CHANGE;  Surgeon: [name], MD;  Location: [place];  Service: Thoracic;  Laterality: N/A;  . Sternal wound debridement N/A 09/08/2015    Procedure: STERNAL WOUND DEBRIDEMENT;  Surgeon: [name], MD;  Location: [place];  Service: Thoracic;  Laterality: N/A;  . Chest tube insertion  Left 09/08/2015    Procedure: CHEST TUBE INSERTION;  Surgeon: [name], MD;  Location: [place];  Service: Thoracic;  Laterality: Left;  . Pectoralis flap N/A 09/08/2015    Procedure: Muscle FLAP;  Surgeon: [name], DO;  Location: [place];  Service: Plastics;  Laterality: N/A;     Current Outpatient Prescriptions  Medication Sig Dispense Refill  . ALPRAZolam (XANAX) 0.25  MG tablet Take 0.25 mg by mouth as needed. .25  0  . aspirin EC 325 MG EC tablet Take 1 tablet (325 mg total) by mouth daily.    Marland Kitchen atorvastatin (LIPITOR) 80 MG tablet Take 1 tablet (80 mg total) by mouth daily at 6 PM. 30 tablet 1  . docusate sodium (DOCU SOFT) 100 MG capsule Take 100 mg by mouth 2 (two) times daily.    . feeding supplement (BOOST / RESOURCE BREEZE) LIQD Take 1 Container by mouth 3 (three) times daily with meals.  0  . feeding supplement, ENSURE ENLIVE, (ENSURE ENLIVE) LIQD Take 237 mLs by mouth 3 (three) times daily with meals. 237 mL 12  . ferrous [id number] C-folic acid (TRINSICON / FOLTRIN) capsule Take 1 capsule by mouth 2 (two) times daily. 60 capsule 3  . furosemide (LASIX) 20 MG tablet Take 1 tablet (20 mg total) by mouth daily as needed. Take with Potassium 30 tablet 6  . losartan (COZAAR) 25 MG tablet Take 2 tablets (50 mg total) by mouth daily. 90 tablet 5  . metoprolol (LOPRESSOR) 50 MG tablet Take 1.5 tablets (75 mg total) by mouth 2 (two) times daily. 90 tablet 6  . oxyCODONE (OXY IR/ROXICODONE) 5 MG immediate release tablet Take 1 tablet (5 mg total) by mouth every 4 (four) hours as needed for moderate pain or severe pain. 50 tablet 0  . oxyCODONE (OXY IR/ROXICODONE) 5 MG immediate release tablet Take 1 tablet (5 mg total) by mouth every 4 (four) hours as needed for moderate pain. 30 tablet 0  . potassium chloride SA (K-DUR,KLOR-CON) 20 MEQ tablet Take 1 tablet (20 mEq total) by mouth daily. 30 tablet 6   No current facility-administered medications for this visit.    Allergies:   Other and Peanut-containing drug products    Social History:  The patient  reports that she has never smoked. She does not have any smokeless tobacco history on file. She reports that she does not drink alcohol or use illicit drugs.   Family History:  The patient's family history is negative for Colon cancer.    ROS: All other systems are reviewed and negative. Unless  otherwise mentioned in H&P    PHYSICAL EXAM: VS:  BP 140/76 mmHg  Pulse 82  Ht 5\' 10"  (1.778 m)  Wt 165 lb (74.844 kg)  BMI 23.68 kg/m2  SpO2 98% , BMI Body mass index is 23.68 kg/(m^2). GEN: Well nourished, well developed, in no acute distress HEENT: normal Neck: no JVD, carotid bruits, or masses Cardiac:RRR; no murmurs, rubs, or gallops,no edema  Respiratory:  clear to auscultation bilaterally, normal work of breathing GI: soft, nontender, nondistended, + BS MS: no deformity or atrophydressing to sternotomy, with Jackson-Pratt drain in situ.  The patient has no evidence of drainage on the dressing site, there is no odor, there is no erythema, exterior to the dressing, she also has a dressing under her left breast was no drainage.  Signs of infection or erythema around the dressing site.she has mild lymphedema to the left arm. Skin: warm and dry, no  rash Neuro:  Strength and sensation are intact Psych: euthymic mood, full affect  Recent Labs: 08/11/2015: TSH 2.467 09/04/2015: Magnesium 1.6* 09/10/2015: ALT 16 09/14/2015: BUN 10; Creatinine, Ser 0.99; Hemoglobin 7.4*; Platelets 392; Potassium 3.6; Sodium 141    Lipid Panel    Component Value Date/Time   CHOL 157 08/11/2015 0611   TRIG 73 08/11/2015 0611   HDL 51 08/11/2015 0611   CHOLHDL 3.1 08/11/2015 0611   VLDL 15 08/11/2015 0611   LDLCALC 91 08/11/2015 0611      Wt Readings from Last 3 Encounters:  09/26/15 165 lb (74.844 kg)  09/13/15 178 lb (80.74 kg)  08/28/15 175 lb (79.379 kg)     ASSESSMENT AND PLAN:  1. Coronary artery disease: She is status post coronary artery bypass grafting( LIMA to LAD, SVG to ramus, SVG to posterior descending ):Continues to have soreness at the sternotomy site, however, this is related to, infection, of the sternotomy site. She continues to have some overall fatigue, and lack of appetite.hopefully this will improve as she becomes more active.  I would like for her to go to cardiac  rehabilitation.  She is not quite ready to move forward with that as she continues to heal from sternotomy infection, and has a JP drain in place.  We will see her again in a couple weeks to evaluate her status.  May need to make changes in her blood pressure medication.  Once she is feeling some better.  2. Hypertension: blood pressure medication was adjusted by CV[facility] as she was hypertensive at home with increase of losartan to 50 mg daily from 25 mg daily.  I answered multiple questions about this.  She is to take to 50 mg once a day.  She is to continue metoprolol 75 mg twice a day.  On next visit.  May change this to once a day dosing at night, which may also assist with fatigue.  3. Lower extremity edema:this has resolved with use of Lasix, which is now using when necessary.  Most recent labs are reviewed, with a potassium of 3.6 on 09/14/2015. Her cardiology patient's, would like to keep her potassium 4.0 at a minimum. I will follow-up with BMET to evaluate her status.  I have given her refills on potassium in case this is something she will need to begin taking again despite stopping Lasix.she has home health nurses see her several times a week, and a orders written to have them draw blood on her next visit with her.  I will review the labs and call them with results.  I have given him a copy of her most recent labs from 09/14/2015.  4. Lack of appetite:I have advised her to start drinking the Ensure supplements to assist with nutrition until her appetite returns.  I have given her a prescription for this for assistance with insurance and purchasing this.  Nutritional supplement is important for her to drink during the healing process of her infected sternotomy site, and for overall nutritional support.   Current medicines are reviewed at length with the patient today.    Labs/ tests ordered today include: BMET No orders of the defined types were placed in this encounter.     Disposition:   FU  with 2 weeks.  Signed, [name], NP  09/26/2015 3:23 PM    [facility] [address] [place], [place] [address] Phone: [phone]; Fax: [phone]

## 2015-09-26 NOTE — Progress Notes (Deleted)
Name: [name] [name]    DOB: July 20, 1941  Age: 75 y.o.  MR#: [id number]       PCP:  Lanette Hampshire, MD      Insurance: Payor: [employer] Rossetti MEDICARE / Plan: [employer] MEDICARE CHOICE PPO / Product Type: *No Product type* /   CC:   No chief complaint on file.   VS Filed Vitals:   09/26/15 1511  BP: 140/76  Pulse: 82  Height: 5\' 10"  (1.778 m)  Weight: 165 lb (74.844 kg)  SpO2: 98%    Weights Current Weight  09/26/15 165 lb (74.844 kg)  09/13/15 178 lb (80.74 kg)  08/28/15 175 lb (79.379 kg)    Blood Pressure  BP Readings from Last 3 Encounters:  09/26/15 140/76  09/14/15 143/80  08/28/15 110/68     Admit date:  (Not on file) Last encounter with RMR:  09/23/2015   Allergy Other and Peanut-containing drug products  Current Outpatient Prescriptions  Medication Sig Dispense Refill  . ALPRAZolam (XANAX) 0.25 MG tablet Take 0.25 mg by mouth as needed. .25  0  . aspirin EC 325 MG EC tablet Take 1 tablet (325 mg total) by mouth daily.    Marland Kitchen atorvastatin (LIPITOR) 80 MG tablet Take 1 tablet (80 mg total) by mouth daily at 6 PM. 30 tablet 1  . docusate sodium (DOCU SOFT) 100 MG capsule Take 100 mg by mouth 2 (two) times daily.    . feeding supplement (BOOST / RESOURCE BREEZE) LIQD Take 1 Container by mouth 3 (three) times daily with meals.  0  . feeding supplement, ENSURE ENLIVE, (ENSURE ENLIVE) LIQD Take 237 mLs by mouth 3 (three) times daily with meals. 237 mL 12  . ferrous Q000111Q C-folic acid (TRINSICON / FOLTRIN) capsule Take 1 capsule by mouth 2 (two) times daily. 60 capsule 3  . furosemide (LASIX) 20 MG tablet Take 1 tablet (20 mg total) by mouth daily as needed. Take with Potassium 30 tablet 6  . losartan (COZAAR) 25 MG tablet Take 2 tablets (50 mg total) by mouth daily. 90 tablet 5  . metoprolol (LOPRESSOR) 50 MG tablet Take 1.5 tablets (75 mg total) by mouth 2 (two) times daily. 90 tablet 6  . oxyCODONE (OXY IR/ROXICODONE) 5 MG immediate release tablet Take 1 tablet (5 mg  total) by mouth every 4 (four) hours as needed for moderate pain or severe pain. 50 tablet 0  . oxyCODONE (OXY IR/ROXICODONE) 5 MG immediate release tablet Take 1 tablet (5 mg total) by mouth every 4 (four) hours as needed for moderate pain. 30 tablet 0  . potassium chloride SA (K-DUR,KLOR-CON) 20 MEQ tablet Take 1 tablet (20 mEq total) by mouth daily. 30 tablet 6   No current facility-administered medications for this visit.    Discontinued Meds:   There are no discontinued medications.  Patient Active Problem List   Diagnosis Date Noted  . Wound infection after surgery 09/01/2015  . 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 Sternal wound dehiscence 08/31/2015  . S/P CABG x 3 08/14/2015  . CAD (coronary artery disease), native coronary artery 08/11/2015  . CAD (coronary artery disease)   . Acute inferior myocardial infarction ([place]) 08/10/2015  . Acute MI, inferior wall, initial episode of care ([place])   . Essential hypertension 02/19/2015    LABS    Component Value Date/Time   NA 141 09/14/2015 0654   NA 138 09/10/2015 0614   NA 138 09/09/2015 0555   K 3.6 09/14/2015 0654   K 3.4* 09/10/2015  0614   K 3.9 09/09/2015 0555   CL 108 09/14/2015 0654   CL 105 09/10/2015 0614   CL 103 09/09/2015 0555   CO2 22 09/14/2015 0654   CO2 26 09/10/2015 0614   CO2 23 09/09/2015 0555   GLUCOSE 110* 09/14/2015 0654   GLUCOSE 111* 09/10/2015 0614   GLUCOSE 288* 09/09/2015 0555   BUN 10 09/14/2015 0654   BUN 13 09/10/2015 0614   BUN 12 09/09/2015 0555   CREATININE 0.99 09/14/2015 0654   CREATININE 1.30* 09/10/2015 0614   CREATININE 1.36* 09/09/2015 0555   CALCIUM 8.7* 09/14/2015 0654   CALCIUM 8.0* 09/10/2015 0614   CALCIUM 7.9* 09/09/2015 0555   GFRNONAA 55* 09/14/2015 0654   GFRNONAA 39* 09/10/2015 0614   GFRNONAA 37* 09/09/2015 0555   GFRAA >60 09/14/2015 0654   GFRAA 46* 09/10/2015 0614   GFRAA 43* 09/09/2015 0555   CMP     Component Value Date/Time   NA 141 09/14/2015 0654    K 3.6 09/14/2015 0654   CL 108 09/14/2015 0654   CO2 22 09/14/2015 0654   GLUCOSE 110* 09/14/2015 0654   BUN 10 09/14/2015 0654   CREATININE 0.99 09/14/2015 0654   CALCIUM 8.7* 09/14/2015 0654   PROT 5.1* 09/10/2015 0614   ALBUMIN 1.7* 09/10/2015 0614   AST 20 09/10/2015 0614   ALT 16 09/10/2015 0614   ALKPHOS 84 09/10/2015 0614   BILITOT 0.4 09/10/2015 0614   GFRNONAA 55* 09/14/2015 0654   GFRAA >60 09/14/2015 0654       Component Value Date/Time   WBC 8.3 09/14/2015 0654   WBC 9.7 09/11/2015 0520   WBC 11.5* 09/10/2015 0614   HGB 7.4* 09/14/2015 0654   HGB 7.3* 09/11/2015 0520   HGB 7.6* 09/10/2015 0614   HCT 23.9* 09/14/2015 0654   HCT 23.2* 09/11/2015 0520   HCT 24.9* 09/10/2015 0614   MCV 87.5 09/14/2015 0654   MCV 86.9 09/11/2015 0520   MCV 88.0 09/10/2015 0614    Lipid Panel     Component Value Date/Time   CHOL 157 08/11/2015 0611   TRIG 73 08/11/2015 0611   HDL 51 08/11/2015 0611   CHOLHDL 3.1 08/11/2015 0611   VLDL 15 08/11/2015 0611   LDLCALC 91 08/11/2015 0611    ABG    Component Value Date/Time   PHART 7.461* 09/07/2015 1740   PCO2ART 33.7* 09/07/2015 1740   PO2ART 67.0* 09/07/2015 1740   HCO3 24.0 09/07/2015 1740   TCO2 25 09/07/2015 1740   O2SAT 94.0 09/07/2015 1740     Lab Results  Component Value Date   TSH 2.467 08/11/2015   BNP (last 3 results) No results for input(s): BNP in the last 8760 hours.  ProBNP (last 3 results) No results for input(s): PROBNP in the last 8760 hours.  Cardiac Panel (last 3 results) No results for input(s): CKTOTAL, CKMB, TROPONINI, RELINDX in the last 72 hours.  Iron/TIBC/Ferritin/ %Sat No results found for: IRON, TIBC, FERRITIN, IRONPCTSAT   EKG Orders placed or performed in visit on 08/28/15  . 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 There is a trace of pleural fluid at the right lung base which is stable. The interstitial markings are mildly increased on the right but also stable. On the left there is a moderate-sized pleural effusion not greatly changed from the earlier study. The aerated portion of lung exhibits minimal prominence of the interstitial markings similar to that on the right. The retrocardiac region remains dense. The heart is normal in size. The patient has undergone previous CABG. The pulmonary vascularity is not engorged. The mediastinum is normal in width. The bony thorax exhibits no acute abnormality. The PICC line tip projects over the junction of the middle and distal thirds of the SVC. IMPRESSION: Moderate sized left pleural effusion, stable. Small right pleural effusion also stable. Persistent left basilar density likely reflecting atelectasis or less likely pneumonia. Electronically Signed   By: David  Martinique M.D.   On: 09/08/2015 07:18   Ct Chest W Contrast  08/31/2015  CLINICAL DATA:  CABG 08/14/2015. Now with redness and drainage from incision. EXAM: CT CHEST WITH CONTRAST TECHNIQUE: Multidetector CT imaging of the chest was performed during intravenous contrast administration. CONTRAST:  16mL OMNIPAQUE IOHEXOL 300 MG/ML  SOLN COMPARISON:  Chest two view 08/19/2015 FINDINGS: Extensive retrosternal fluid collection with rim enhancing wall consistent with abscess. This is anterior to the pericardium and posterior to the sternum. Fluid  extends into the sternal notch were there is a 2 cm fluid collection containing gas bubbles. Findings are consistent with abscess. The retrosternal fluid collection measures approximately 19 x 53 mm on transverse images. There is thickening of the pericardium but no pericardial effusion. Coronary calcification. Heart size normal. Small bilateral pleural effusions with dependent atelectasis in both lung bases. Negative for pneumonia. Negative for mass or adenopathy. No thoracic spine fracture. IMPRESSION: Large retrosternal abscess extending into the sternal notch. Pericardial thickening without pericardial effusion Small bilateral pleural effusions with bibasilar atelectasis. These results were called by telephone at the time of interpretation on 08/31/2015 at 5:39 pm to Dr. Julianne Rice , who verbally acknowledged these results. Electronically Signed   By: Franchot Gallo M.D.   On: 08/31/2015 17:39   Dg Chest Port 1 View  09/10/2015  CLINICAL DATA:  Sternal wound debridement for infection, chest tube insertion. EXAM: PORTABLE CHEST 1 VIEW COMPARISON:  Portable chest x-ray of September 09, 2015 FINDINGS: The lungs are well-expanded. A small amount of pleural fluid blunts the costophrenic angles. There is mild subsegmental atelectasis at both lung bases. There is no pneumothorax or pneumomediastinum. The heart is top-normal in size. The pulmonary vascularity is not engorged. There is mild widening of the mediastinum. A longitudinally oriented new mediastinal drainage catheter has its tip projecting over the T4 vertebral body. The left-sided chest tube tip projects over the posterior medial aspect of the seventh rib. A right-sided PICC line tip projects over the midportion of the SVC. There is an additional tubular structure that projects over the right axillary region which is stable. IMPRESSION: 1. The chest and mediastinal drainage tubes are in stable position. Mild mediastinal widening persists. 2. Persistent  bibasilar subsegmental atelectasis. Traces of pleural fluid blunt the costophrenic angles. No pneumothorax or pneumomediastinum. Electronically Signed   By: Shanon Brow  Martinique M.D.   On: 09/10/2015 07:55   Dg Chest Port 1 View  09/09/2015  CLINICAL DATA:  Shortness of Breath EXAM: PORTABLE CHEST 1 VIEW COMPARISON:  09/08/2015 FINDINGS: Cardiac shadow is mildly enlarged but stable. Postsurgical changes consistent with coronary bypass grafting are seen. A right-sided PICC line, mediastinal drain and left thoracostomy catheter are again identified and stable. Small bilateral pleural effusions are noted. Mild residual atelectasis is noted on the left. No pneumothorax is seen. IMPRESSION: Tubes and lines stable in appearance. Improving aeration left base with minimal residual atelectasis. New small right-sided pleural effusion. Electronically Signed   By: Inez Catalina M.D.   On: 09/09/2015 07:50   Dg Chest Portable 1 View  09/08/2015  CLINICAL DATA:  Left pleural effusion. EXAM: PORTABLE CHEST 1 VIEW COMPARISON:  Same day. FINDINGS: Stable cardiomediastinal silhouette. Status post coronary artery bypass graft. Endotracheal tube has been removed. Interval placement of left-sided chest tube without evidence of pneumothorax. Left pleural effusion is significantly smaller compared to prior exam. Atelectasis or scarring remains in the left midlung. Minimal atelectasis is noted in right midlung. Right-sided PICC line is again noted with distal tip in the expected position of cavoatrial junction. IMPRESSION: Endotracheal tube has been removed. Interval placement of left-sided chest tube with nearly complete resolution of left pleural effusion noted on prior exam. No pneumothorax is noted. Electronically Signed   By: Marijo Conception, M.D.   On: 09/08/2015 10:55   Dg Chest Port 1 View  09/08/2015  CLINICAL DATA:  Right pneumothorax. Sternal wound debridement earlier today with pectoralis flap. EXAM: PORTABLE CHEST 1 VIEW  COMPARISON:  Earlier today FINDINGS: Grossly stable heart size. There is upper mediastinal widening accentuated by differences in technique and left-sided pleural or parenchymal opacity. Right upper extremity PICC with tip in stable position at the upper cavoatrial junction. New endotracheal tube with tip just above the clavicular heads. There are new surgical drains over the central and right chest soft tissues based on op note. Low volumes with hazy appearance compatible with layering pleural fluid, left more than right. Extensive and dense opacity on the left is not entirely explained by pleural fluid and there is likely a degree of lobar or segmental collapse. Status post CABG. Negative for pneumothorax. IMPRESSION: 1. Unremarkable appearance of tubes and central line. 2. Layering bilateral pleural effusion. Extensive opacification of the left chest suggests superimposed atelectasis/ collapse. 3. No pneumothorax. Electronically Signed   By: Monte Fantasia M.D.   On: 09/08/2015 10:33   Dg Chest Port 1 View  09/07/2015  CLINICAL DATA:  Left pleural effusion and left lower lobe consolidation EXAM: PORTABLE CHEST 1 VIEW COMPARISON:  September 06, 2015 FINDINGS: Central catheter tip is in the superior vena cava. No pneumothorax. There is persistent left lower lobe consolidation with left effusion. No new opacity. Heart is mildly enlarged with pulmonary vascular within normal limits, stable. No adenopathy evident. IMPRESSION: Persistent left lower lobe consolidation with left effusion. No new opacity. No change in cardiac silhouette. No pneumothorax. Electronically Signed   By: Lowella Grip III M.D.   On: 09/07/2015 07:49   Dg Chest Port 1 View  09/06/2015  CLINICAL DATA:  Coronary artery disease with recent coronary artery bypass grafting EXAM: PORTABLE CHEST 1 VIEW COMPARISON:  September 05, 2015 FINDINGS: There is persistent left lower lobe consolidation with left effusion. Right lung is clear. Heart is  mildly enlarged with pulmonary vascularity within normal limits. Central catheter tip is in the superior vena  cava. No pneumothorax. No adenopathy. IMPRESSION: Persistent left lower lobe consolidation with left effusion. No new opacity. No change in cardiac silhouette. No pneumothorax. Electronically Signed   By: Lowella Grip III M.D.   On: 09/06/2015 08:05   Dg Chest Port 1 View  09/05/2015  CLINICAL DATA:  75 year old female status post CABG in January with sternal wound infection, postop day 1 Sternal wound debridement, excisional, and irrigation. Wound VAC change. Initial encounter. EXAM: PORTABLE CHEST 1 VIEW COMPARISON:  09/04/2015 and earlier. FINDINGS: Portable AP semi upright view at 0604 hours. Right PICC line is stable, tip projects at the cavoatrial junction level. Stable lung volumes. Stable cardiac size and mediastinal contours. Veiling and confluent mid and left lung base opacity not significantly changed. No pneumothorax or edema. The right lung remains clear. IMPRESSION: Stable ventilation with left pleural effusion and lower lobe collapse or consolidation. Electronically Signed   By: Genevie Ann M.D.   On: 09/05/2015 07:43   Dg Chest Port 1 View  09/04/2015  CLINICAL DATA:  Sternal wound debridement, postop EXAM: PORTABLE CHEST 1 VIEW COMPARISON:  Purple chest x-ray of 09/03/2015, CT chest of 08/31/2015 FINDINGS: There is persistent opacity at the left lung base consistent with left lower lobe atelectasis, pneumonia, and probable left pleural effusion. There appears to be a tiny right pleural effusion present. Mild cardiomegaly is stable and a PICC line remains unchanged in position. IMPRESSION: 1. Slightly better aeration although persistent opacity at the left lung base is noted consistent with atelectasis, pneumonia, and left pleural effusion. 2. Small right pleural effusion. Electronically Signed   By: Ivar Drape M.D.   On: 09/04/2015 13:27   Dg Chest Port 1 View  09/03/2015  CLINICAL  DATA:  Central catheter placement.  Hypertension. EXAM: PORTABLE CHEST 1 VIEW COMPARISON:  September 01, 2015 FINDINGS: Central catheter tip is in the superior vena cava near the cavoatrial junction. No pneumothorax. Lungs appear somewhat hyperexpanded. There is persistent airspace consolidation left lower lobe with a small left effusion. The right lung is clear. Heart is slightly enlarged with pulmonary vascularity within normal limits, stable. Patient is status post coronary artery bypass grafting. IMPRESSION: Central catheter tip in superior vena cava near the cavoatrial junction without pneumothorax. Persistent left lower lobe consolidation with small left effusion. No change in cardiac silhouette. Electronically Signed   By: Lowella Grip III M.D.   On: 09/03/2015 07:33   Dg Chest Port 1 View  09/01/2015  CLINICAL DATA:  Sternal wound debridement wound VAC reportedly in place. EXAM: PORTABLE CHEST 1 VIEW COMPARISON:  CT scan chest of August 31, 2015 FINDINGS: The wound VAC catheter is faintly visible extending toward the mid sternal region to the right of midline. Another catheter like structure overlies the mid thorax extending transversely from right to left and appears external to the patient. The mediastinum remains mildly widened. The cardiac silhouette is normal in size. There is left lower lobe atelectasis and small left pleural effusion. There are 3 sternal wires in place which appear intact. IMPRESSION: The precise location of the tip of the wound VAC is difficult to discern but may lie just above the uppermost sternal wire on the frontal image. To better judged adequacy of positioning, limited CT scanning or at least a lateral chest x-ray would be useful. Electronically Signed   By: David  Martinique M.D.   On: 09/01/2015 09:44

## 2015-09-26 NOTE — Patient Instructions (Addendum)
Your physician recommends that you schedule a follow-up appointment in: 2 weeks with Arnold Long NP  Please get [id number] on Tuesday 09/30/15, you are provided with prescription for Home Health  Your physician recommends that you continue on your current medications as directed. Please refer to the Current Medication list given to you today.   You were given an Rx for Ensure  You were provided with copy of recent lab results   Your potassium 20 meq daily was refilled at [facility]      Thank you for choosing [facility] !

## 2015-09-30 ENCOUNTER — Other Ambulatory Visit: Payer: Self-pay | Admitting: Cardiothoracic Surgery

## 2015-09-30 DIAGNOSIS — Z951 Presence of aortocoronary bypass graft: Secondary | ICD-10-CM

## 2015-10-01 ENCOUNTER — Ambulatory Visit
Admission: RE | Admit: 2015-10-01 | Discharge: 2015-10-01 | Disposition: A | Discharge status: Home / Self Care | Payer: Medicare PPO | Source: Ambulatory Visit | Attending: Cardiothoracic Surgery | Admitting: Cardiothoracic Surgery

## 2015-10-01 ENCOUNTER — Encounter: Payer: Self-pay | Admitting: Cardiothoracic Surgery

## 2015-10-01 ENCOUNTER — Ambulatory Visit (INDEPENDENT_AMBULATORY_CARE_PROVIDER_SITE_OTHER): Payer: Self-pay | Admitting: Cardiothoracic Surgery

## 2015-10-01 ENCOUNTER — Ambulatory Visit: Payer: Medicare PPO | Admitting: Physician Assistant

## 2015-10-01 VITALS — BP 166/97 | HR 76 | Resp 20 | Ht 70.0 in | Wt 165.0 lb

## 2015-10-01 DIAGNOSIS — J90 Pleural effusion, not elsewhere classified: Secondary | ICD-10-CM

## 2015-10-01 DIAGNOSIS — Z951 Presence of aortocoronary bypass graft: Secondary | ICD-10-CM

## 2015-10-01 DIAGNOSIS — T8132XA Disruption of internal operation (surgical) wound, not elsewhere classified, initial encounter: Secondary | ICD-10-CM

## 2015-10-01 NOTE — Progress Notes (Signed)
PCP is Lanette Hampshire, MD Referring Provider is Sueanne Margarita, MD  Chief Complaint  Patient presents with  . Routine Post Op     f/u after surgery with CXR, s/p Sternal wound debridement Placement of wound VAC, Left chest tube placement, CABG    HPI: Routine followup 2 months after multivessel CABG for unstable angina The patient had disruption of her sternal wires and required wound VAC and then right pectoralis muscle flap by Dr. Marla Roe. She has received an excellent result from that reconstructive surgery. Her chest wound is healed and stable and she has minimal pain. Her drains are all out and she is off antibiotics. Cultures of the wound were consistently negative.  She had a postoperative left pleural effusion which is now resolved on today's chest x-ray.  She has had no symptoms of angina or CHF. She is not yet ready to start outpatient cardiac rehabilitation but is ready to start walking at home for 10-15 minutes 5/7 days. She should not lift any more than 10 pounds. She  will return for followup in 4 weeks to assess her progress and to refer her to outpatient cardiac rehabilitation.  Her blood pressure is been an issue but on her losartan 50 mg a day and metoprolol 50 mg twice a day her blood pressure today measuring in the right arm is 150/80.  Past Medical History  Diagnosis Date  . Hypertension   . Anxiety about health 08/28/2015    Past Surgical History  Procedure Laterality Date  . Back surgery    . Abdominal hysterectomy    . Appendectomy    . Colonoscopy N/A 03/29/2013    Procedure: COLONOSCOPY;  Surgeon: Rogene Houston, MD;  [facility];  Service: Endoscopy;  Laterality: N/A;  930  . Cardiac catheterization N/A 08/10/2015    Procedure: Left Heart Cath and Coronary Angiography;  Surgeon: [name], MD;  Location: [place] [place] [facility];  Service: Cardiovascular;  Laterality: N/A;  . Cardiac catheterization  08/10/2015    Procedure: Coronary  Balloon Angioplasty;  Surgeon: [name], MD;  Location: [place] [facility];  Service: Cardiovascular;;  . Coronary artery bypass graft N/A 08/14/2015    Procedure: CORONARY ARTERY BYPASS GRAFTING (CABG)X3 LIMA-LAD; SVG-RAMUS; SVG-PD TRANSESOPHAGEAL ECHOCARDIOGRAM (TEE);  Surgeon: [name], MD;  Location: [place];  Service: Open Heart Surgery;  Laterality: N/A;  . Tee without cardioversion N/A 08/14/2015    Procedure: TRANSESOPHAGEAL ECHOCARDIOGRAM (TEE);  Surgeon: [name], MD;  Location: [place];  Service: Open Heart Surgery;  Laterality: N/A;  . Sternal wound debridement N/A 09/01/2015    Procedure: STERNAL WOUND DEBRIDEMENT;  Surgeon: [name], MD;  Location: [place];  Service: Thoracic;  Laterality: N/A;  . Sternal wound debridement N/A 09/04/2015    Procedure: STERNAL WOUND DEBRIDEMENT;  Surgeon: [name], MD;  Location: [place];  Service: Thoracic;  Laterality: N/A;  . Application of wound vac N/A 09/04/2015    Procedure: WOUND VAC CHANGE;  Surgeon: [name], MD;  Location: [place];  Service: Thoracic;  Laterality: N/A;  . Sternal wound debridement N/A 09/08/2015    Procedure: STERNAL WOUND DEBRIDEMENT;  Surgeon: [name], MD;  Location: [place];  Service: Thoracic;  Laterality: N/A;  . Chest tube insertion Left 09/08/2015    Procedure: CHEST TUBE INSERTION;  Surgeon: [name], MD;  Location: [place];  Service: Thoracic;  Laterality: Left;  . Pectoralis flap N/A 09/08/2015  Procedure: Muscle FLAP;  Surgeon: [name], DO;  Location: [place];  Service: Plastics;  Laterality: N/A;    Family History  Problem Relation Age of Onset  . Colon cancer Neg Hx     Social History Social History  Substance Use Topics  . Smoking status: Never Smoker   . Smokeless tobacco: None  . Alcohol Use: No    Current Outpatient Prescriptions  Medication Sig Dispense Refill  . ALPRAZolam (XANAX) 0.25 MG tablet Take 0.25 mg by mouth as needed. .25  0  .  aspirin EC 325 MG EC tablet Take 1 tablet (325 mg total) by mouth daily.    Marland Kitchen atorvastatin (LIPITOR) 80 MG tablet Take 1 tablet (80 mg total) by mouth daily at 6 PM. 30 tablet 1  . docusate sodium (DOCU SOFT) 100 MG capsule Take 100 mg by mouth 2 (two) times daily.    . feeding supplement, ENSURE ENLIVE, (ENSURE ENLIVE) LIQD Take 237 mLs by mouth 3 (three) times daily with meals. 237 mL 12  . ferrous Q000111Q C-folic acid (TRINSICON / FOLTRIN) capsule Take 1 capsule by mouth 2 (two) times daily. 60 capsule 3  . furosemide (LASIX) 20 MG tablet Take 1 tablet (20 mg total) by mouth daily as needed. Take with Potassium 30 tablet 6  . losartan (COZAAR) 25 MG tablet Take 2 tablets (50 mg total) by mouth daily. 90 tablet 5  . metoprolol (LOPRESSOR) 50 MG tablet Take 1.5 tablets (75 mg total) by mouth 2 (two) times daily. 90 tablet 6  . oxyCODONE (OXY IR/ROXICODONE) 5 MG immediate release tablet Take 1 tablet (5 mg total) by mouth every 4 (four) hours as needed for moderate pain. 30 tablet 0  . potassium chloride SA (K-DUR,KLOR-CON) 20 MEQ tablet Take 1 tablet (20 mEq total) by mouth daily. 90 tablet 4   No current facility-administered medications for this visit.    Allergies  Allergen Reactions  . Other Hives and Itching    Food Dye Red Dye #40  . Peanut-Containing Drug Products Rash    Mild rash    Review of Systems Appetite is improving Overall strength in exercise tolerance is improving She is nervous about using her arms but she can lift them now to wash her hair etc. She'll wear her breast binder until she is checked again by  Dr. Marla Roe Blood pressure 150/80, pulse 80 and regular, respirations 16, saturation 96% room air Physical Exam Alert and comfortable Lungs clear Sternal incision clean and dry, healed Heart rhythm regular murmur or rub No pedal edema  Diagnostic Tests: Chest x-ray clear-sternal wires all been removed Resolution of left pleural  effusion  Impression: Excellent early course now after sternal reconstruction with pectoralis muscle flap for nonhealing incision and disrupted wires Patient will start walking, continue current meds for blood pressure and cardiac disease Not yet ready to start outpatient rehabilitation but this will be soon Plan:return for followup visit in 4 weeks   Len Childs, MD [facility] [phone]

## 2015-10-13 ENCOUNTER — Encounter: Payer: Self-pay | Admitting: Adult Health

## 2015-10-13 ENCOUNTER — Ambulatory Visit (INDEPENDENT_AMBULATORY_CARE_PROVIDER_SITE_OTHER): Payer: Medicare PPO | Admitting: Adult Health

## 2015-10-13 VITALS — BP 142/84 | HR 64 | Ht 70.0 in | Wt 164.0 lb

## 2015-10-13 DIAGNOSIS — I2510 Atherosclerotic heart disease of native coronary artery without angina pectoris: Secondary | ICD-10-CM

## 2015-10-13 DIAGNOSIS — I10 Essential (primary) hypertension: Secondary | ICD-10-CM | POA: Diagnosis not present

## 2015-10-13 MED ORDER — ATORVASTATIN CALCIUM 80 MG PO TABS: 80.0000 mg | ORAL_TABLET | Freq: Every day | ORAL | Status: DC

## 2015-10-13 NOTE — Progress Notes (Signed)
Cardiology Office Note   Date:  10/13/2015   ID:  [name], [name] 21-Sep-1940, MRN [id number]  PCP:  [name], MD  Cardiologist: To be Est  [name], NP   Chief Complaint  Patient presents with  . Coronary Artery Disease    Status post CABG      History of Present Illness: [name] [name] is a 75 y.o. female who presents for ongoing assessment and management of coronary artery disease status post three-vessel coronary artery bypass grafting in January 2017, hypertension, history of myocardial infarction, and left pleural effusion. The patient was noted to have a superficial sternal wound infection with debridement and irrigation, placement of a wound VAC by Dr. [name] on 09/01/2015. At the time of last office visit.  She continued to be fatigued, but he was beginning to become slightly more active.  She continued to have her JP drain in place.  She is followed on close evaluation to evaluate her status and changes in blood pressure medications, which she has seen surgeons.  No medications were changed at the time of her office visit.  Labs drawn on 09/14/2015 revealed sodium 141, potassium 3.6, chloride 108, CO2 22, BUN 10, creatinine 0.99.  Hemoglobin 7.4, hematocrit 23.9, white blood cells 8.3, platelets 392.  She comes today feeling much better.  She is followed up with surgery, and JP drain has been removed.  Blood pressure is better controlled without medication adjustment.  She is beginning to regain her energy.  There is no evidence of infection or pain at the sternotomy site.  She denies any symptoms of chest pain or dyspnea.  Past Medical History  Diagnosis Date  . Hypertension   . Anxiety about health 08/28/2015    Past Surgical History  Procedure Laterality Date  . Back surgery    . Abdominal hysterectomy    . Appendectomy    . Colonoscopy N/A 03/29/2013    Procedure: COLONOSCOPY;  Surgeon: Rogene Houston, MD;  [facility];  Service: Endoscopy;   Laterality: N/A;  930  . Cardiac catheterization N/A 08/10/2015    Procedure: Left Heart Cath and Coronary Angiography;  Surgeon: [name], MD;  Location: [place] [facility];  Service: Cardiovascular;  Laterality: N/A;  . Cardiac catheterization  08/10/2015    Procedure: Coronary Balloon Angioplasty;  Surgeon: [name], MD;  Location: [place] [facility];  Service: Cardiovascular;;  . Coronary artery bypass graft N/A 08/14/2015    Procedure: CORONARY ARTERY BYPASS GRAFTING (CABG)X3 LIMA-LAD; SVG-RAMUS; SVG-PD TRANSESOPHAGEAL ECHOCARDIOGRAM (TEE);  Surgeon: [name], MD;  Location: [place];  Service: Open Heart Surgery;  Laterality: N/A;  . Tee without cardioversion N/A 08/14/2015    Procedure: TRANSESOPHAGEAL ECHOCARDIOGRAM (TEE);  Surgeon: [name], MD;  Location: [place];  Service: Open Heart Surgery;  Laterality: N/A;  . Sternal wound debridement N/A 09/01/2015    Procedure: STERNAL WOUND DEBRIDEMENT;  Surgeon: [name], MD;  Location: [place];  Service: Thoracic;  Laterality: N/A;  . Sternal wound debridement N/A 09/04/2015    Procedure: STERNAL WOUND DEBRIDEMENT;  Surgeon: [name], MD;  Location: [place];  Service: Thoracic;  Laterality: N/A;  . Application of wound vac N/A 09/04/2015    Procedure: WOUND VAC CHANGE;  Surgeon: [name], MD;  Location: [place];  Service: Thoracic;  Laterality: N/A;  . Sternal wound debridement N/A 09/08/2015    Procedure: STERNAL WOUND DEBRIDEMENT;  Surgeon: [name],  MD;  Location: [place];  Service: Thoracic;  Laterality: N/A;  . Chest tube insertion Left 09/08/2015    Procedure: CHEST TUBE INSERTION;  Surgeon: [name], MD;  Location: [place];  Service: Thoracic;  Laterality: Left;  . Pectoralis flap N/A 09/08/2015    Procedure: Muscle FLAP;  Surgeon: [name], DO;  Location: [place];  Service: Plastics;  Laterality: N/A;     Current Outpatient Prescriptions  Medication Sig Dispense Refill  .  ALPRAZolam (XANAX) 0.25 MG tablet Take 0.25 mg by mouth as needed. .25  0  . aspirin EC 325 MG EC tablet Take 1 tablet (325 mg total) by mouth daily.    Marland Kitchen atorvastatin (LIPITOR) 80 MG tablet Take 1 tablet (80 mg total) by mouth daily at 6 PM. 30 tablet 11  . docusate sodium (DOCU SOFT) 100 MG capsule Take 100 mg by mouth 2 (two) times daily.    . feeding supplement, ENSURE ENLIVE, (ENSURE ENLIVE) LIQD Take 237 mLs by mouth 3 (three) times daily with meals. 237 mL 12  . ferrous Q000111Q C-folic acid (TRINSICON / FOLTRIN) capsule Take 1 capsule by mouth 2 (two) times daily. 60 capsule 3  . furosemide (LASIX) 20 MG tablet Take 1 tablet (20 mg total) by mouth daily as needed. Take with Potassium 30 tablet 6  . losartan (COZAAR) 25 MG tablet Take 2 tablets (50 mg total) by mouth daily. 90 tablet 5  . metoprolol (LOPRESSOR) 50 MG tablet Take 1.5 tablets (75 mg total) by mouth 2 (two) times daily. 90 tablet 6  . oxyCODONE (OXY IR/ROXICODONE) 5 MG immediate release tablet Take 1 tablet (5 mg total) by mouth every 4 (four) hours as needed for moderate pain. 30 tablet 0  . potassium chloride SA (K-DUR,KLOR-CON) 20 MEQ tablet Take 1 tablet (20 mEq total) by mouth daily. 90 tablet 4   No current facility-administered medications for this visit.    Allergies:   Other and Peanut-containing drug products    Social History:  The patient  reports that she has never smoked. She does not have any smokeless tobacco history on file. She reports that she does not drink alcohol or use illicit drugs.   Family History:  The patient's family history is negative for Colon cancer.    ROS: All other systems are reviewed and negative. Unless otherwise mentioned in H&P    PHYSICAL EXAM: VS:  BP 142/84 mmHg  Pulse 64  Ht 5\' 10"  (1.778 m)  Wt 164 lb (74.39 kg)  BMI 23.53 kg/m2  SpO2 99% , BMI Body mass index is 23.53 kg/(m^2). GEN: Well nourished, well developed, in no acute distress HEENT:  normal Neck: no JVD, carotid bruits, or masses Cardiac: RRR; no murmurs, rubs, or gallops,no edema  Respiratory:  clear to auscultation bilaterally, normal work of breathing GI: soft, nontender, nondistended, + BS MS: no deformity or atrophysternotomy site is well-healed without evidence of infection or drainage.  It is not sore to touch. Skin: warm and dry, no rash Neuro:  Strength and sensation are intact Psych: euthymic mood, full affect  Recent Labs: 08/11/2015: TSH 2.467 09/04/2015: Magnesium 1.6* 09/10/2015: ALT 16 09/14/2015: BUN 10; Creatinine, Ser 0.99; Hemoglobin 7.4*; Platelets 392; Potassium 3.6; Sodium 141    Lipid Panel    Component Value Date/Time   CHOL 157 08/11/2015 0611   TRIG 73 08/11/2015 0611   HDL 51 08/11/2015 0611   CHOLHDL 3.1 08/11/2015 0611   VLDL 15 08/11/2015 [id number]  [place] 91 08/11/2015 0611      Wt Readings from Last 3 Encounters:  10/13/15 164 lb (74.39 kg)  10/01/15 165 lb (74.844 kg)  09/26/15 165 lb (74.844 kg)     ASSESSMENT AND PLAN:  1. CAD, with history of recent coronary artery bypass grafting, January 2017. She is without complaint today.  I would like her to begin cardiac rehabilitation once she has been released from surgeons.  She is to see them one more time.  Once she is cleared to proceed, would like to get her there as soon as possible, as she will need to have slow and progressive increase in activity.  Will not make any changes in her medication at this time.  She is stable from a cardiac standpoint.  2. Status post sternotomy wound infection:JP drain has been removed, and she has a healthy incision site.  Blood pressure is much better controlled now that she is over the infection.  Will defer to surgeons for further recommendations.  3. Hypertension:blood pressure is currently stable.  She is doing well.  We will not make any changes at this time.  We will see her again in 4 months.   Current medicines are reviewed at length  with the patient today.    Labs/ tests ordered today include:  No orders of the defined types were placed in this encounter.     Disposition:   FU with 4 months Signed, [name], NP  10/13/2015 4:00 PM    [facility] [address] [place], [place] [address] Phone: [phone]; Fax: [phone] a

## 2015-10-13 NOTE — Progress Notes (Signed)
Name: [name] [name]    DOB: 12-25-1940  Age: 75 y.o.  MR#: [id number]       PCP:  [name], MD      Insurance: Payor: [employer] Rossetti MEDICARE / Plan: [employer] MEDICARE CHOICE PPO / Product Type: *No Product type* /   CC:   No chief complaint on file.   VS Filed Vitals:   10/13/15 1435  BP: 142/84  Pulse: 64  Height: 5\' 10"  (1.778 m)  Weight: 164 lb (74.39 kg)  SpO2: 99%    Weights Current Weight  10/13/15 164 lb (74.39 kg)  10/01/15 165 lb (74.844 kg)  09/26/15 165 lb (74.844 kg)    Blood Pressure  BP Readings from Last 3 Encounters:  10/13/15 142/84  10/01/15 166/97  09/26/15 140/76     Admit date:  (Not on file) Last encounter with RMR:  09/26/2015   Allergy Other and Peanut-containing drug products  Current Outpatient Prescriptions  Medication Sig Dispense Refill  . ALPRAZolam (XANAX) 0.25 MG tablet Take 0.25 mg by mouth as needed. .25  0  . aspirin EC 325 MG EC tablet Take 1 tablet (325 mg total) by mouth daily.    Marland Kitchen atorvastatin (LIPITOR) 80 MG tablet Take 1 tablet (80 mg total) by mouth daily at 6 PM. 30 tablet 1  . docusate sodium (DOCU SOFT) 100 MG capsule Take 100 mg by mouth 2 (two) times daily.    . feeding supplement, ENSURE ENLIVE, (ENSURE ENLIVE) LIQD Take 237 mLs by mouth 3 (three) times daily with meals. 237 mL 12  . ferrous Q000111Q C-folic acid (TRINSICON / FOLTRIN) capsule Take 1 capsule by mouth 2 (two) times daily. 60 capsule 3  . furosemide (LASIX) 20 MG tablet Take 1 tablet (20 mg total) by mouth daily as needed. Take with Potassium 30 tablet 6  . losartan (COZAAR) 25 MG tablet Take 2 tablets (50 mg total) by mouth daily. 90 tablet 5  . metoprolol (LOPRESSOR) 50 MG tablet Take 1.5 tablets (75 mg total) by mouth 2 (two) times daily. 90 tablet 6  . oxyCODONE (OXY IR/ROXICODONE) 5 MG immediate release tablet Take 1 tablet (5 mg total) by mouth every 4 (four) hours as needed for moderate pain. 30 tablet 0  . potassium chloride SA (K-DUR,KLOR-CON) 20  MEQ tablet Take 1 tablet (20 mEq total) by mouth daily. 90 tablet 4   No current facility-administered medications for this visit.    Discontinued Meds:   There are no discontinued medications.  Patient Active Problem List   Diagnosis Date Noted  . Wound infection after surgery 09/01/2015  . 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 Sternal wound dehiscence 08/31/2015  . S/P CABG x 3 08/14/2015  . CAD (coronary artery disease), native coronary artery 08/11/2015  . CAD (coronary artery disease)   . Acute inferior myocardial infarction ([place]) 08/10/2015  . Acute MI, inferior wall, initial episode of care ([place])   . Essential hypertension 02/19/2015    LABS    Component Value Date/Time   NA 141 09/14/2015 0654   NA 138 09/10/2015 0614   NA 138 09/09/2015 0555   K 3.6 09/14/2015 0654   K 3.4* 09/10/2015 0614   K 3.9 09/09/2015 0555   CL 108 09/14/2015 0654   CL 105 09/10/2015 0614   CL 103 09/09/2015 0555   CO2 22 09/14/2015 0654   CO2 26 09/10/2015 0614   CO2 23 09/09/2015 0555   GLUCOSE 110* 09/14/2015 0654   GLUCOSE 111* 09/10/2015 [id number]  GLUCOSE 288* 09/09/2015 0555   BUN 10 09/14/2015 0654   BUN 13 09/10/2015 0614   BUN 12 09/09/2015 0555   CREATININE 0.99 09/14/2015 0654   CREATININE 1.30* 09/10/2015 0614   CREATININE 1.36* 09/09/2015 0555   CALCIUM 8.7* 09/14/2015 0654   CALCIUM 8.0* 09/10/2015 0614   CALCIUM 7.9* 09/09/2015 0555   GFRNONAA 55* 09/14/2015 0654   GFRNONAA 39* 09/10/2015 0614   GFRNONAA 37* 09/09/2015 0555   GFRAA >60 09/14/2015 0654   GFRAA 46* 09/10/2015 0614   GFRAA 43* 09/09/2015 0555   CMP     Component Value Date/Time   NA 141 09/14/2015 0654   K 3.6 09/14/2015 0654   CL 108 09/14/2015 0654   CO2 22 09/14/2015 0654   GLUCOSE 110* 09/14/2015 0654   BUN 10 09/14/2015 0654   CREATININE 0.99 09/14/2015 0654   CALCIUM 8.7* 09/14/2015 0654   PROT 5.1* 09/10/2015 0614   ALBUMIN 1.7* 09/10/2015 0614   AST 20 09/10/2015 0614   ALT 16  09/10/2015 0614   ALKPHOS 84 09/10/2015 0614   BILITOT 0.4 09/10/2015 0614   GFRNONAA 55* 09/14/2015 0654   GFRAA >60 09/14/2015 0654       Component Value Date/Time   WBC 8.3 09/14/2015 0654   WBC 9.7 09/11/2015 0520   WBC 11.5* 09/10/2015 0614   HGB 7.4* 09/14/2015 0654   HGB 7.3* 09/11/2015 0520   HGB 7.6* 09/10/2015 0614   HCT 23.9* 09/14/2015 0654   HCT 23.2* 09/11/2015 0520   HCT 24.9* 09/10/2015 0614   MCV 87.5 09/14/2015 0654   MCV 86.9 09/11/2015 0520   MCV 88.0 09/10/2015 0614    Lipid Panel     Component Value Date/Time   CHOL 157 08/11/2015 0611   TRIG 73 08/11/2015 0611   HDL 51 08/11/2015 0611   CHOLHDL 3.1 08/11/2015 0611   VLDL 15 08/11/2015 0611   LDLCALC 91 08/11/2015 0611    ABG    Component Value Date/Time   PHART 7.461* 09/07/2015 1740   PCO2ART 33.7* 09/07/2015 1740   PO2ART 67.0* 09/07/2015 1740   HCO3 24.0 09/07/2015 1740   TCO2 25 09/07/2015 1740   O2SAT 94.0 09/07/2015 1740     Lab Results  Component Value Date   TSH 2.467 08/11/2015   BNP (last 3 results) No results for input(s): BNP in the last 8760 hours.  ProBNP (last 3 results) No results for input(s): PROBNP in the last 8760 hours.  Cardiac Panel (last 3 results) No results for input(s): CKTOTAL, CKMB, TROPONINI, RELINDX in the last 72 hours.  Iron/TIBC/Ferritin/ %Sat No results found for: IRON, TIBC, FERRITIN, IRONPCTSAT   EKG Orders placed or performed in visit on 08/28/15  . EKG 12-Lead     Prior Assessment and Plan Problem List as of 10/13/2015      Cardiovascular and Mediastinum   Essential hypertension   Acute MI, inferior wall, initial episode of care [facility])   Acute inferior myocardial infarction ([place])   CAD (coronary artery disease), native coronary artery   CAD (coronary artery disease)     Other   S/P CABG x 3   Retrosternal abscess ([facility])   Sternal wound dehiscence   Wound infection after surgery       Imaging: Dg Chest 2 View  10/01/2015   CLINICAL DATA:  Follow-up from CABG August 14, 2015; no chest complaints ; history of sternal dehiscence and retrosternal abscess. EXAM: CHEST  2 VIEW COMPARISON:  Portable chest x-ray of September 10, 2015 FINDINGS: The right lung is well-expanded and clear. There are surgical clips in the right axillary region. There is persistent mild volume loss at the left lung base consistent with pleural fluid and pleural thickening. The retrosternal soft tissues exhibit minimal prominence in the mid and lower retrosternal region. The heart is normal in size. The pulmonary vascularity is not engorged. There is mild stable tortuosity of the descending thoracic aorta. IMPRESSION: Improved aeration of both lungs with no evidence of pulmonary interstitial edema. Small amount of pleural thickening or pleural fluid at the left lung base. Mild fullness of the mid and lower retrosternal soft tissues likely reflecting the residua of the previous abscess. Electronically Signed   By: David  Martinique M.D.   On: 10/01/2015 10:31

## 2015-10-13 NOTE — Patient Instructions (Signed)
Your physician wants you to follow-up in: 4 months You will receive a reminder letter in the mail two months in advance. If you don't receive a letter, please call our office to schedule the follow-up appointment.    Your physician recommends that you continue on your current medications as directed. Please refer to the Current Medication list given to you today.    Call us when you are released by Dr Prescott Gum and we will place a referral for cardiac rehab for you      Thank you for choosing [facility] !

## 2015-10-14 LAB — AFB CULTURE WITH SMEAR (NOT AT ARMC): Acid Fast Smear: NONE SEEN

## 2015-10-17 ENCOUNTER — Other Ambulatory Visit: Payer: Self-pay | Admitting: Surgical

## 2015-10-29 ENCOUNTER — Encounter: Payer: Self-pay | Admitting: Cardiothoracic Surgery

## 2015-10-29 ENCOUNTER — Ambulatory Visit (INDEPENDENT_AMBULATORY_CARE_PROVIDER_SITE_OTHER): Payer: Self-pay | Admitting: Cardiothoracic Surgery

## 2015-10-29 VITALS — BP 144/85 | HR 78 | Resp 16 | Ht 70.0 in | Wt 163.8 lb

## 2015-10-29 DIAGNOSIS — T8132XD Disruption of internal operation (surgical) wound, not elsewhere classified, subsequent encounter: Secondary | ICD-10-CM

## 2015-10-29 DIAGNOSIS — Z951 Presence of aortocoronary bypass graft: Secondary | ICD-10-CM

## 2015-10-29 NOTE — Progress Notes (Signed)
PCP is [name], MD Referring Provider is Sueanne Margarita, MD  Chief Complaint  Patient presents with  . Routine Post Op    4 wk f/u     [id number] presents for routine followup and wound check after multivessel CABGthis past January followed by sternal wound debridement and muscle flap reconstruction in mid February by Dr. Marla Roe  she is doing well. The sternum is well-healed and stable. She has no symptoms of angina or heart failure. She is ready to start driving and increase her activity level to include lifting up to 20 pounds maximum. She'll be referred to outpatient cardiac rehabilitation at [facility] She knows to continue her blood pressure medication, aspirin, Lipitor, and heart healthy diet.   Past Medical History  Diagnosis Date  . Hypertension   . Anxiety about health 08/28/2015    Past Surgical History  Procedure Laterality Date  . Back surgery    . Abdominal hysterectomy    . Appendectomy    . Colonoscopy N/A 03/29/2013    Procedure: COLONOSCOPY;  Surgeon: Rogene Houston, MD;  [facility];  Service: Endoscopy;  Laterality: N/A;  930  . Cardiac catheterization N/A 08/10/2015    Procedure: Left Heart Cath and Coronary Angiography;  Surgeon: [name], MD;  Location: [place] CV LAB;  Service: Cardiovascular;  Laterality: N/A;  . Cardiac catheterization  08/10/2015    Procedure: Coronary Balloon Angioplasty;  Surgeon: [name], MD;  Location: [place] CV LAB;  Service: Cardiovascular;;  . Coronary artery bypass graft N/A 08/14/2015    Procedure: CORONARY ARTERY BYPASS GRAFTING (CABG)X3 LIMA-LAD; SVG-RAMUS; SVG-PD TRANSESOPHAGEAL ECHOCARDIOGRAM (TEE);  Surgeon: [name], MD;  Location: [place];  Service: Open Heart Surgery;  Laterality: N/A;  . Tee without cardioversion N/A 08/14/2015    Procedure: TRANSESOPHAGEAL ECHOCARDIOGRAM (TEE);  Surgeon: [name], MD;  Location: [place];  Service: Open Heart Surgery;   Laterality: N/A;  . Sternal wound debridement N/A 09/01/2015    Procedure: STERNAL WOUND DEBRIDEMENT;  Surgeon: [name], MD;  Location: [place];  Service: Thoracic;  Laterality: N/A;  . Sternal wound debridement N/A 09/04/2015    Procedure: STERNAL WOUND DEBRIDEMENT;  Surgeon: [name], MD;  Location: [place];  Service: Thoracic;  Laterality: N/A;  . Application of wound vac N/A 09/04/2015    Procedure: WOUND VAC CHANGE;  Surgeon: [name], MD;  Location: [place];  Service: Thoracic;  Laterality: N/A;  . Sternal wound debridement N/A 09/08/2015    Procedure: STERNAL WOUND DEBRIDEMENT;  Surgeon: [name], MD;  Location: [place];  Service: Thoracic;  Laterality: N/A;  . Chest tube insertion Left 09/08/2015    Procedure: CHEST TUBE INSERTION;  Surgeon: [name], MD;  Location: [place];  Service: Thoracic;  Laterality: Left;  . Pectoralis flap N/A 09/08/2015    Procedure: Muscle FLAP;  Surgeon: [name], DO;  Location: [place];  Service: Plastics;  Laterality: N/A;    Family History  Problem Relation Age of Onset  . Colon cancer Neg Hx     Social History Social History  Substance Use Topics  . Smoking status: Never Smoker   . Smokeless tobacco: None  . Alcohol Use: No    Current Outpatient Prescriptions  Medication Sig Dispense Refill  . ALPRAZolam (XANAX) 0.25 MG tablet Take 0.25 mg by mouth as needed. .25  0  . aspirin EC 325 MG EC tablet Take 1 tablet (325  mg total) by mouth daily.    Marland Kitchen atorvastatin (LIPITOR) 80 MG tablet Take 1 tablet (80 mg total) by mouth daily at 6 PM. 30 tablet 11  . docusate sodium (DOCU SOFT) 100 MG capsule Take 100 mg by mouth 2 (two) times daily.    . feeding supplement, ENSURE ENLIVE, (ENSURE ENLIVE) LIQD Take 237 mLs by mouth 3 (three) times daily with meals. 237 mL 12  . ferrous Q000111Q C-folic acid (TRINSICON / FOLTRIN) capsule Take 1 capsule by mouth 2 (two) times daily. 60 capsule 3  . furosemide (LASIX) 20 MG  tablet Take 1 tablet (20 mg total) by mouth daily as needed. Take with Potassium 30 tablet 6  . losartan (COZAAR) 25 MG tablet Take 2 tablets (50 mg total) by mouth daily. 90 tablet 5  . metoprolol (LOPRESSOR) 50 MG tablet Take 1.5 tablets (75 mg total) by mouth 2 (two) times daily. 90 tablet 6  . oxyCODONE (OXY IR/ROXICODONE) 5 MG immediate release tablet Take 1 tablet (5 mg total) by mouth every 4 (four) hours as needed for moderate pain. 30 tablet 0  . potassium chloride SA (K-DUR,KLOR-CON) 20 MEQ tablet Take 1 tablet (20 mEq total) by mouth daily. 90 tablet 4   No current facility-administered medications for this visit.    Allergies  Allergen Reactions  . Other Hives and Itching    Food Dye Red Dye #40  . Peanut-Containing Drug Products Rash    Mild rash    Review of Systems   Patient is walking out on her road daily, probably 10 minutes No problems with dizziness chest pain or ankle edema  BP 144/85 mmHg  Pulse 78  Resp 16  Ht 5\' 10"  (1.778 m)  Wt 163 lb 12.8 oz (74.299 kg)  BMI 23.50 kg/m2  SpO2 98% Physical Exam Alert and comfortable Lungs clear Sternum stable well-healed Heart rhythm regular murmur  Diagnostic Tests: No chest x-ray today  Impression: Excellent recovery after CABG followed by sternal reconstruction with right pectoralis muscle flap for sternal wires cutting thru her sternal bone.  Plan:she will start outpatient rehabilitation. She may start lifting up to 20 pounds maximum. I'll see the patient back for a final wound check with chest x-ray in approximately 8 weeks.   Len Childs, MD [facility] [phone]

## 2015-11-10 ENCOUNTER — Other Ambulatory Visit (HOSPITAL_COMMUNITY): Payer: Self-pay | Admitting: Adult Health

## 2015-11-10 DIAGNOSIS — I10 Essential (primary) hypertension: Secondary | ICD-10-CM

## 2015-11-10 MED ORDER — LOSARTAN POTASSIUM 50 MG PO TABS
50.0000 mg | ORAL_TABLET | Freq: Every day | ORAL | Status: DC
Start: 2015-11-10 — End: 2015-11-27

## 2015-11-10 NOTE — Telephone Encounter (Signed)
1. Which medications need to be refilled? (please list name of each medication and dose if known) losartan (COZAAR) 25 MG tablet [id number]   2. Which pharmacy/location (including street and city if local pharmacy) is medication to be sent to? [place]  3. Do they need a 30 day or 90 day supply? 90 day  Pt is wondering if she can get the 50 mg pill instead of the 25mg 

## 2015-11-25 ENCOUNTER — Encounter (HOSPITAL_COMMUNITY)
Admission: RE | Admit: 2015-11-25 | Discharge: 2015-11-25 | Disposition: A | Discharge status: Home / Self Care | Payer: Medicare PPO | Source: Ambulatory Visit | Attending: Cardiothoracic Surgery | Admitting: Cardiothoracic Surgery

## 2015-11-25 ENCOUNTER — Telehealth: Payer: Self-pay | Admitting: Adult Health

## 2015-11-25 ENCOUNTER — Ambulatory Visit (INDEPENDENT_AMBULATORY_CARE_PROVIDER_SITE_OTHER): Payer: Medicare PPO | Admitting: Internal Medicine

## 2015-11-25 VITALS — BP 172/90 | HR 72 | Ht 70.0 in | Wt 163.3 lb

## 2015-11-25 DIAGNOSIS — Z951 Presence of aortocoronary bypass graft: Secondary | ICD-10-CM | POA: Insufficient documentation

## 2015-11-25 NOTE — Telephone Encounter (Signed)
Please call patient regarding elevated BP / tg

## 2015-11-25 NOTE — Progress Notes (Signed)
Cardiac/Pulmonary Rehab Medication Review by a Pharmacist  Does the patient  feel that his/her medications are working for him/her?  yes  Has the patient been experiencing any side effects to the medications prescribed?  no  Does the patient measure his/her own blood pressure or blood glucose at home?  yes   Does the patient have any problems obtaining medications due to transportation or finances?   no  Understanding of regimen: excellent Understanding of indications: excellent Potential of compliance: excellent  Questions asked to Determine Patient Understanding of Medication Regimen:  1. What is the name of the medication?  2. What is the medication used for?  3. When should it be taken?  4. How much should be taken?  5. How will you take it?  6. What side effects should you report?  Understanding Defined as: Excellent: All questions above are correct Good: Questions 1-4 are correct Fair: Questions 1-2 are correct  Poor: 1 or none of the above questions are correct   Pharmacist comments: Mrs. Corradi compliant with medications, aware of indications, and tolerating medications. She is monitoring her blood pressure and concerned with the variations. Metoprolol dose changed and I instructed her that it would take about 2 weeks for full effect of this to be realized. If she has additional problems with blood pressure, I instructed her to call the physicians office and let them know the readings. She has not experienced any lightheadedness or dizziness. Continue with current regimen.   Thanks for the opportunity to participate in the care of this patient,   Isac Sarna, BS [place], [place] Pharmacist Pager [phone] 11/25/2015 2:25 PM

## 2015-11-25 NOTE — Progress Notes (Signed)
Cardiac Individual Treatment Plan  Patient Details  Name: [name] [name] MRN: [id number] Date of Birth: January 08, 1941 Referring Provider:        [place] from 11/25/2015 in [place]   Referring Provider  [name]      Initial Encounter Date:       CARDIAC REHAB PHASE II ORIENTATION from 11/25/2015 in [place]   Date  11/25/15   Referring Provider  [name]      Visit Diagnosis: S/P CABG x 3  Patient's Home Medications on Admission:  Current outpatient prescriptions:  .  metoprolol (LOPRESSOR) 50 MG tablet, Take 75 mg by mouth 2 (two) times daily., Disp: , Rfl:  .  ALPRAZolam (XANAX) 0.25 MG tablet, Take 0.25 mg by mouth as needed. .25, Disp: , Rfl: 0 .  aspirin EC 325 MG EC tablet, Take 1 tablet (325 mg total) by mouth daily., Disp: , Rfl:  .  atorvastatin (LIPITOR) 80 MG tablet, Take 1 tablet (80 mg total) by mouth daily at 6 PM., Disp: 30 tablet, Rfl: 11 .  docusate sodium (DOCU SOFT) 100 MG capsule, Take 100 mg by mouth 2 (two) times daily., Disp: , Rfl:  .  feeding supplement, ENSURE ENLIVE, (ENSURE ENLIVE) LIQD, Take 237 mLs by mouth 3 (three) times daily with meals., Disp: 237 mL, Rfl: 12 .  ferrous Q000111Q C-folic acid (TRINSICON / FOLTRIN) capsule, Take 1 capsule by mouth 2 (two) times daily., Disp: 60 capsule, Rfl: 3 .  furosemide (LASIX) 20 MG tablet, Take 1 tablet (20 mg total) by mouth daily as needed. Take with Potassium (Patient not taking: Reported on 11/25/2015), Disp: 30 tablet, Rfl: 6 .  losartan (COZAAR) 50 MG tablet, Take 1 tablet (50 mg total) by mouth daily., Disp: 90 tablet, Rfl: 3 .  oxyCODONE (OXY IR/ROXICODONE) 5 MG immediate release tablet, Take 1 tablet (5 mg total) by mouth every 4 (four) hours as needed for moderate pain., Disp: 30 tablet, Rfl: 0 .  potassium chloride SA (K-DUR,KLOR-CON) 20 MEQ tablet, Take 1 tablet (20 mEq total) by mouth daily. (Patient not taking:  Reported on 11/25/2015), Disp: 90 tablet, Rfl: 4  Past Medical History: Past Medical History  Diagnosis Date  . Hypertension   . Anxiety about health 08/28/2015    Tobacco Use: History  Smoking status  . Never Smoker   Smokeless tobacco  . Not on file    Labs:     Recent Review Flowsheet Data    Labs for ITP Cardiac and Pulmonary Rehab Latest Ref Rng 08/15/2015 08/15/2015 08/16/2015 08/17/2015 09/07/2015   PHART 7.350 - 7.450 - - - - 7.461(H)   PCO2ART 35.0 - 45.0 mmHg - - - - 33.7(L)   HCO3 20.0 - 24.0 mEq/L - - - - 24.0   TCO2 0 - 100 mmol/L 24 - - - 25   O2SAT - - 69.7 72.0 65.0 94.0      Capillary Blood Glucose: Lab Results  Component Value Date   GLUCAP 99 09/14/2015   GLUCAP 120* 09/13/2015   GLUCAP 102* 09/13/2015   GLUCAP 133* 09/13/2015   GLUCAP 108* 09/13/2015     Exercise Target Goals: Date: 11/25/15  Exercise Program Goal: Individual exercise prescription set with THRR, safety & activity barriers. Participant demonstrates ability to understand and report RPE using BORG scale, to self-measure pulse accurately, and to acknowledge the importance of the exercise prescription.  Exercise Prescription Goal: Starting with aerobic activity 30 plus  minutes a day, 3 days per week for initial exercise prescription. Provide home exercise prescription and guidelines that participant acknowledges understanding prior to discharge.  Activity Barriers & Risk Stratification:     Activity Barriers & Cardiac Risk Stratification - 11/25/15 1629    Activity Barriers & Cardiac Risk Stratification   Activity Barriers Other (comment)  BP was extremely high   Comments BP was extremely elevated.    Cardiac Risk Stratification High      6 Minute Walk:   Initial Exercise Prescription:     Initial Exercise Prescription - 11/25/15 1600    Date of Initial Exercise RX and Referring Provider   Date 11/25/15   Referring Provider [name]   Treadmill   MPH 1.5   Grade 0    Minutes 15   METs 2.14   NuStep   Level 2   Watts 25   Minutes 15   METs 1.9   Prescription Details   Frequency (times per week) 3   Duration Progress to 30 minutes of continuous aerobic without signs/symptoms of physical distress   Intensity   THRR REST +  30   THRR 40-80% of Max Heartrate 101-116-130   Ratings of Perceived Exertion 11-15   Perceived Dyspnea 0-4   Progression   Progression Continue to progress workloads to maintain intensity without signs/symptoms of physical distress.   Resistance Training   Training Prescription Yes   Weight 1   Reps 10-12      Perform Capillary Blood Glucose checks as needed.  Exercise Prescription Changes:   Exercise Comments:    Discharge Exercise Prescription (Final Exercise Prescription Changes):   Nutrition:  Target Goals: Understanding of nutrition guidelines, daily intake of sodium 1500mg , cholesterol 200mg , calories 30% from fat and 7% or less from saturated fats, daily to have 5 or more servings of fruits and vegetables.  Biometrics:     Pre Biometrics - 11/25/15 1646    Pre Biometrics   Waist Circumference 36 inches   Hip Circumference 37.5 inches   Waist to Hip Ratio 0.96 %   Triceps Skinfold 18 mm   Grip Strength 44 kg   Flexibility 12.8 in   Single Leg Stand 4 seconds       Nutrition Therapy Plan and Nutrition Goals:     Nutrition Therapy & Goals - 11/25/15 1645    Intervention Plan   Intervention Nutrition handout(s) given to patient.   Expected Outcomes Short Term Goal: Understand basic principles of dietary content, such as calories, fat, sodium, cholesterol and nutrients.;Long Term Goal: Adherence to prescribed nutrition plan.      Nutrition Discharge: Rate Your Plate Scores:     Nutrition Assessments - 11/25/15 1648    MEDFICTS Scores   Pre Score 6      Nutrition Goals Re-Evaluation:   Psychosocial: Target Goals: Acknowledge presence or absence of depression, maximize coping skills,  provide positive support system. Participant is able to verbalize types and ability to use techniques and skills needed for reducing stress and depression.  Initial Review & Psychosocial Screening:     Initial Psych Review & Screening - 11/25/15 [place]? Yes   Comments Concerned about BP being extremely elevated   Barriers   Psychosocial barriers to participate in program There are no identifiable barriers or psychosocial needs.   Screening Interventions   Interventions Encouraged to exercise      Quality of Life Scores:  Quality of Life - 11/25/15 1728    Quality of Life Scores   Health/Function Pre 22.13 %   Socioeconomic Pre 20.6 %   Psych/Spiritual Pre 23.36 %   Family Pre 25.13 %   GLOBAL Pre 22.59 %      PHQ-9:     Recent Review Flowsheet Data    Depression screen [facility] 2/9 11/25/2015   Decreased Interest 0   Down, Depressed, Hopeless 0   PHQ - 2 Score 0   Altered sleeping 1   Tired, decreased energy 1   Change in appetite 0   Feeling bad or failure about yourself  0   Trouble concentrating 0   Moving slowly or fidgety/restless 0   Suicidal thoughts 0   PHQ-9 Score 2   Difficult doing work/chores Somewhat difficult      Psychosocial Evaluation and Intervention:   Psychosocial Re-Evaluation:   Vocational Rehabilitation: Provide vocational rehab assistance to qualifying candidates.   Vocational Rehab Evaluation & Intervention:     Vocational Rehab - 11/25/15 1637    Initial Vocational Rehab Evaluation & Intervention   Assessment shows need for Vocational Rehabilitation No      Education: Education Goals: Education classes will be provided on a weekly basis, covering required topics. Participant will state understanding/return demonstration of topics presented.  Learning Barriers/Preferences:     Learning Barriers/Preferences - 11/25/15 1635    Learning Barriers/Preferences   Learning Barriers None    Learning Preferences Skilled Demonstration  Kinesthetic/Doing      Education Topics: Hypertension, Hypertension Reduction -Define heart disease and high blood pressure. Discus how high blood pressure affects the body and ways to reduce high blood pressure.   Exercise and Your Heart -Discuss why it is important to exercise, the FITT principles of exercise, normal and abnormal responses to exercise, and how to exercise safely.   Angina -Discuss definition of angina, causes of angina, treatment of angina, and how to decrease risk of having angina.   Cardiac Medications -Review what the following cardiac medications are used for, how they affect the body, and side effects that may occur when taking the medications.  Medications include Aspirin, Beta blockers, calcium channel blockers, ACE Inhibitors, angiotensin receptor blockers, diuretics, digoxin, and antihyperlipidemics.   Congestive Heart Failure -Discuss the definition of CHF, how to live with CHF, the signs and symptoms of CHF, and how keep track of weight and sodium intake.   Heart Disease and Intimacy -Discus the effect sexual activity has on the heart, how changes occur during intimacy as we age, and safety during sexual activity.   Smoking Cessation / COPD -Discuss different methods to quit smoking, the health benefits of quitting smoking, and the definition of COPD.   Nutrition I: Fats -Discuss the types of cholesterol, what cholesterol does to the heart, and how cholesterol levels can be controlled.   Nutrition II: Labels -Discuss the different components of food labels and how to read food label   Heart Parts and Heart Disease -Discuss the anatomy of the heart, the pathway of blood circulation through the heart, and these are affected by heart disease.   Stress I: Signs and Symptoms -Discuss the causes of stress, how stress may lead to anxiety and depression, and ways to limit stress.   Stress II:  Relaxation -Discuss different types of relaxation techniques to limit stress.   Warning Signs of Stroke / TIA -Discuss definition of a stroke, what the signs and symptoms are of a stroke, and how to identify  when someone is having stroke.   Knowledge Questionnaire Score:     Knowledge Questionnaire Score - 11/25/15 1637    Knowledge Questionnaire Score   Pre Score 19/24      Core Components/Risk Factors/Patient Goals at Admission:     Personal Goals and Risk Factors at Admission - 11/25/15 1717    Core Components/Risk Factors/Patient Goals on Admission    Weight Management Weight Maintenance   Sedentary --  No   Increase Strength and Stamina Yes   Intervention Provide advice, education, support and counseling about physical activity/exercise needs.;Develop an individualized exercise prescription for aerobic and resistive training based on initial evaluation findings, risk stratification, comorbidities and participant's personal goals.   Expected Outcomes Achievement of increased cardiorespiratory fitness and enhanced flexibility, muscular endurance and strength shown through measurements of functional capacity and personal statement of participant.   Tobacco Cessation --  NO   Diabetes --  Patient says no. AIC is 6.1.    Heart Failure --  NO   Hypertension Yes   Intervention Provide education on lifestyle modifcations including regular physical activity/exercise, weight management, moderate sodium restriction and increased consumption of fresh fruit, vegetables, and low fat dairy, alcohol moderation, and smoking cessation.;Monitor prescription use compliance.   Expected Outcomes Short Term: Continued assessment and intervention until BP is < 140/57mm HG in hypertensive participants. < 130/30mm HG in hypertensive participants with diabetes, heart failure or chronic kidney disease.;Long Term: Maintenance of blood pressure at goal levels.   Lipids --  WNL   Stress --  None    Personal Goal Other Yes   Personal Goal Gain strength and stamina, Get back to ADL's without elevated BP.   Intervention Check to see if taking the correct dose of medication for BP.   Expected Outcomes Become stronger and to see BP come down to within normal limits.       Core Components/Risk Factors/Patient Goals Review:    Core Components/Risk Factors/Patient Goals at Discharge (Final Review):    ITP Comments:     ITP Comments      11/25/15 1715           ITP Comments Unable to due patients walk test on orientation day due to BP being too elevated. Will do the walk test on the day she comes back to exercise. Will send ITP for initial signature without initial walk test results. Weill add results later date.           Comments: Patient arrived for 1st visit/orientation/education at 1330. Patient was referred to CR by Dr. Tharon Aquas Tright due to CABGx3 (Z95.5). During orientation advised patient on arrival and appointment times what to wear, what to do before, during and after exercise. Reviewed attendance and class policy. Talked about inclement weather and class consultation policy. Pt is scheduled to return Cardiac Rehab on 12/01/15 at 0930. Pt was advised to come to class 15 minutes before class starts. She was also given instructions on meeting with the dietician and attending the Family Structure classes. Pt is eager to get started. Patient was not able to complete 6 minute walk test due to elevated blood pressure levels. Will attempt to complete walk test on 12/01/15 when patient returns. Patient was measured for the equipment. Discussed equipment safety with patient. Took patient pre-anthropometric measurements. Patient finished visit at 1634.

## 2015-11-26 ENCOUNTER — Other Ambulatory Visit (HOSPITAL_COMMUNITY): Payer: Self-pay | Admitting: Internal Medicine

## 2015-11-26 ENCOUNTER — Telehealth: Payer: Self-pay | Admitting: Adult Health

## 2015-11-26 DIAGNOSIS — Z1231 Encounter for screening mammogram for malignant neoplasm of breast: Secondary | ICD-10-CM

## 2015-11-26 NOTE — Telephone Encounter (Signed)
Patient saw Dr. Maudie Mercury this morning and he changed her medication to Lopressor 50mg  "with a fluid pill" (hctz)   [name] [name] is wondering if she needs to keep her appointment with [name] tomorrow at 210p.tomorrow since Dr. Maudie Mercury modified her BP medication.

## 2015-11-26 NOTE — Telephone Encounter (Signed)
Pt called and states that she was in cardiac rehab on yesterday doing a walking test and was told her BP was 172/90. Pt states that her BP does run high around 160/98. Pt was asked to record daily BP. Pt requested to be seen in office ASAP. Pt has an appt on May 4 to see [name], NP.

## 2015-11-26 NOTE — Telephone Encounter (Signed)
Pt states that she will come to office visit to office policy of 24 hr late cancellation.

## 2015-11-27 ENCOUNTER — Ambulatory Visit (INDEPENDENT_AMBULATORY_CARE_PROVIDER_SITE_OTHER): Payer: Medicare PPO | Admitting: Adult Health

## 2015-11-27 ENCOUNTER — Encounter: Payer: Self-pay | Admitting: Adult Health

## 2015-11-27 VITALS — BP 159/86 | HR 69 | Ht 70.0 in | Wt 160.0 lb

## 2015-11-27 DIAGNOSIS — I10 Essential (primary) hypertension: Secondary | ICD-10-CM

## 2015-11-27 DIAGNOSIS — I2510 Atherosclerotic heart disease of native coronary artery without angina pectoris: Secondary | ICD-10-CM | POA: Diagnosis not present

## 2015-11-27 NOTE — Patient Instructions (Signed)
Medication Instructions:  Your physician recommends that you continue on your current medications as directed. Please refer to the Current Medication list given to you today.   Labwork: NONE  Testing/Procedures: NONE  Follow-Up: Your physician wants you to follow-up in: [place] KATHRYN LAWRENCE, N.P.  You will receive a reminder letter in the mail two months in advance. If you don't receive a letter, please call our office to schedule the follow-up appointment.   Any Other Special Instructions Will Be Listed Below (If Applicable).     If you need a refill on your cardiac medications before your next appointment, please call your pharmacy.

## 2015-11-27 NOTE — Progress Notes (Deleted)
Name: [name] [name]    DOB: Aug 12, 1940  Age: 75 y.o.  MR#: [id number]       PCP:  [name], MD      Insurance: Payor: Mcarthur Rossetti MEDICARE / Plan: [employer] MEDICARE CHOICE PPO / Product Type: *No Product type* /   CC:   No chief complaint on file.   VS Filed Vitals:   11/27/15 1359  BP: 159/86  Pulse: 69  Height: 5\' 10"  (1.778 m)  Weight: 160 lb (72.576 kg)  SpO2: 98%    Weights Current Weight  11/27/15 160 lb (72.576 kg)  11/25/15 163 lb 4.8 oz (74.072 kg)  10/29/15 163 lb 12.8 oz (74.299 kg)    Blood Pressure  BP Readings from Last 3 Encounters:  11/27/15 159/86  11/25/15 172/90  10/29/15 144/85     Admit date:  (Not on file) Last encounter with RMR:  11/26/2015   Allergy Other and Peanut-containing drug products  Current Outpatient Prescriptions  Medication Sig Dispense Refill  . ALPRAZolam (XANAX) 0.25 MG tablet Take 0.25 mg by mouth as needed. .25  0  . aspirin EC 325 MG EC tablet Take 1 tablet (325 mg total) by mouth daily.    Marland Kitchen atorvastatin (LIPITOR) 80 MG tablet Take 1 tablet (80 mg total) by mouth daily at 6 PM. 30 tablet 11  . docusate sodium (DOCU SOFT) 100 MG capsule Take 100 mg by mouth 2 (two) times daily.    . feeding supplement, ENSURE ENLIVE, (ENSURE ENLIVE) LIQD Take 237 mLs by mouth 3 (three) times daily with meals. 237 mL 12  . ferrous Q000111Q C-folic acid (TRINSICON / FOLTRIN) capsule Take 1 capsule by mouth 2 (two) times daily. 60 capsule 3  . furosemide (LASIX) 20 MG tablet Take 1 tablet (20 mg total) by mouth daily as needed. Take with Potassium 30 tablet 6  . losartan-hydrochlorothiazide (HYZAAR) 50-12.5 MG tablet Take 1 tablet by mouth daily.    . metoprolol (LOPRESSOR) 50 MG tablet Take 75 mg by mouth 2 (two) times daily.    Marland Kitchen oxyCODONE (OXY IR/ROXICODONE) 5 MG immediate release tablet Take 1 tablet (5 mg total) by mouth every 4 (four) hours as needed for moderate pain. 30 tablet 0  . potassium chloride SA (K-DUR,KLOR-CON) 20 MEQ tablet  Take 1 tablet (20 mEq total) by mouth daily. 90 tablet 4   No current facility-administered medications for this visit.    Discontinued Meds:    Medications Discontinued During This Encounter  Medication Reason  . losartan (COZAAR) 50 MG tablet Error    Patient Active Problem List   Diagnosis Date Noted  . Wound infection after surgery 09/01/2015  . 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 Sternal wound dehiscence 08/31/2015  . S/P CABG x 3 08/14/2015  . CAD (coronary artery disease), native coronary artery 08/11/2015  . CAD (coronary artery disease)   . Acute inferior myocardial infarction ([place]) 08/10/2015  . Acute MI, inferior wall, initial episode of care ([place])   . Essential hypertension 02/19/2015    LABS    Component Value Date/Time   NA 141 09/14/2015 0654   NA 138 09/10/2015 0614   NA 138 09/09/2015 0555   K 3.6 09/14/2015 0654   K 3.4* 09/10/2015 0614   K 3.9 09/09/2015 0555   CL 108 09/14/2015 0654   CL 105 09/10/2015 0614   CL 103 09/09/2015 0555   CO2 22 09/14/2015 0654   CO2 26 09/10/2015 0614   CO2 23 09/09/2015 0555  GLUCOSE 110* 09/14/2015 0654   GLUCOSE 111* 09/10/2015 0614   GLUCOSE 288* 09/09/2015 0555   BUN 10 09/14/2015 0654   BUN 13 09/10/2015 0614   BUN 12 09/09/2015 0555   CREATININE 0.99 09/14/2015 0654   CREATININE 1.30* 09/10/2015 0614   CREATININE 1.36* 09/09/2015 0555   CALCIUM 8.7* 09/14/2015 0654   CALCIUM 8.0* 09/10/2015 0614   CALCIUM 7.9* 09/09/2015 0555   GFRNONAA 55* 09/14/2015 0654   GFRNONAA 39* 09/10/2015 0614   GFRNONAA 37* 09/09/2015 0555   GFRAA >60 09/14/2015 0654   GFRAA 46* 09/10/2015 0614   GFRAA 43* 09/09/2015 0555   CMP     Component Value Date/Time   NA 141 09/14/2015 0654   K 3.6 09/14/2015 0654   CL 108 09/14/2015 0654   CO2 22 09/14/2015 0654   GLUCOSE 110* 09/14/2015 0654   BUN 10 09/14/2015 0654   CREATININE 0.99 09/14/2015 0654   CALCIUM 8.7* 09/14/2015 0654   PROT 5.1* 09/10/2015 0614    ALBUMIN 1.7* 09/10/2015 0614   AST 20 09/10/2015 0614   ALT 16 09/10/2015 0614   ALKPHOS 84 09/10/2015 0614   BILITOT 0.4 09/10/2015 0614   GFRNONAA 55* 09/14/2015 0654   GFRAA >60 09/14/2015 0654       Component Value Date/Time   WBC 8.3 09/14/2015 0654   WBC 9.7 09/11/2015 0520   WBC 11.5* 09/10/2015 0614   HGB 7.4* 09/14/2015 0654   HGB 7.3* 09/11/2015 0520   HGB 7.6* 09/10/2015 0614   HCT 23.9* 09/14/2015 0654   HCT 23.2* 09/11/2015 0520   HCT 24.9* 09/10/2015 0614   MCV 87.5 09/14/2015 0654   MCV 86.9 09/11/2015 0520   MCV 88.0 09/10/2015 0614    Lipid Panel     Component Value Date/Time   CHOL 157 08/11/2015 0611   TRIG 73 08/11/2015 0611   HDL 51 08/11/2015 0611   CHOLHDL 3.1 08/11/2015 0611   VLDL 15 08/11/2015 0611   LDLCALC 91 08/11/2015 0611    ABG    Component Value Date/Time   PHART 7.461* 09/07/2015 1740   PCO2ART 33.7* 09/07/2015 1740   PO2ART 67.0* 09/07/2015 1740   HCO3 24.0 09/07/2015 1740   TCO2 25 09/07/2015 1740   O2SAT 94.0 09/07/2015 1740     Lab Results  Component Value Date   TSH 2.467 08/11/2015   BNP (last 3 results) No results for input(s): BNP in the last 8760 hours.  ProBNP (last 3 results) No results for input(s): PROBNP in the last 8760 hours.  Cardiac Panel (last 3 results) No results for input(s): CKTOTAL, CKMB, TROPONINI, RELINDX in the last 72 hours.  Iron/TIBC/Ferritin/ %Sat No results found for: IRON, TIBC, FERRITIN, IRONPCTSAT   EKG Orders placed or performed in visit on 08/28/15  . EKG 12-Lead     Prior Assessment and Plan Problem List as of 11/27/2015      Cardiovascular and Mediastinum   Essential hypertension   Acute MI, inferior wall, initial episode of care [facility])   Acute inferior myocardial infarction ([place])   CAD (coronary artery disease), native coronary artery   CAD (coronary artery disease)     Other   S/P CABG x 3   Retrosternal abscess ([facility])   Sternal wound dehiscence   Wound infection  after surgery       Imaging: No results found.

## 2015-11-27 NOTE — Progress Notes (Signed)
Cardiology Office Note   Date:  11/27/2015   ID:  [name] [name], [name] [name] 09/10/1940, MRN [id number]  PCP:  [name], MD  Cardiologist: To be established/  [name], NP   No chief complaint on file.     History of Present Illness: [name] [name] is a 75 y.o. female who presents for ongoing assessment and management of CAD status post CABG on January 2017, hypertension, myocardial infarction, with left pleural effusion. The patient was last in the office in March of 2017. She was recovering from a sternal wound infection.she had not yet been started on cardiac rehabilitation until she was fully recovered.  She has since seen her primary care physician, and has been placed on losartan HCTZ 50/12.5 mg daily. Her blood pressure has significantly improved from 172/90-159/86. She just took a dose today. She denies any chest pain dizziness palpitations or weakness. She is in fact become more active. She was at cardiac rehabilitation but they have ask her to have her blood pressure better controlled before returning. She is due to start back on 12/01/2015.   Past Medical History  Diagnosis Date  . Hypertension   . Anxiety about health 08/28/2015    Past Surgical History  Procedure Laterality Date  . Back surgery    . Abdominal hysterectomy    . Appendectomy    . Colonoscopy N/A 03/29/2013    Procedure: COLONOSCOPY;  Surgeon: Rogene Houston, MD;  [facility];  Service: Endoscopy;  Laterality: N/A;  930  . Cardiac catheterization N/A 08/10/2015    Procedure: Left Heart Cath and Coronary Angiography;  Surgeon: [name], MD;  Location: [place] CV LAB;  Service: Cardiovascular;  Laterality: N/A;  . Cardiac catheterization  08/10/2015    Procedure: Coronary Balloon Angioplasty;  Surgeon: [name], MD;  Location: [place] CV LAB;  Service: Cardiovascular;;  . Coronary artery bypass graft N/A 08/14/2015    Procedure: CORONARY ARTERY BYPASS GRAFTING (CABG)X3  LIMA-LAD; SVG-RAMUS; SVG-PD TRANSESOPHAGEAL ECHOCARDIOGRAM (TEE);  Surgeon: [name], MD;  Location: [place];  Service: Open Heart Surgery;  Laterality: N/A;  . Tee without cardioversion N/A 08/14/2015    Procedure: TRANSESOPHAGEAL ECHOCARDIOGRAM (TEE);  Surgeon: [name], MD;  Location: [place];  Service: Open Heart Surgery;  Laterality: N/A;  . Sternal wound debridement N/A 09/01/2015    Procedure: STERNAL WOUND DEBRIDEMENT;  Surgeon: [name], MD;  Location: [place];  Service: Thoracic;  Laterality: N/A;  . Sternal wound debridement N/A 09/04/2015    Procedure: STERNAL WOUND DEBRIDEMENT;  Surgeon: [name], MD;  Location: [place];  Service: Thoracic;  Laterality: N/A;  . Application of wound vac N/A 09/04/2015    Procedure: WOUND VAC CHANGE;  Surgeon: [name], MD;  Location: [place];  Service: Thoracic;  Laterality: N/A;  . Sternal wound debridement N/A 09/08/2015    Procedure: STERNAL WOUND DEBRIDEMENT;  Surgeon: [name], MD;  Location: [place];  Service: Thoracic;  Laterality: N/A;  . Chest tube insertion Left 09/08/2015    Procedure: CHEST TUBE INSERTION;  Surgeon: [name], MD;  Location: [place];  Service: Thoracic;  Laterality: Left;  . Pectoralis flap N/A 09/08/2015    Procedure: Muscle FLAP;  Surgeon: [name], DO;  Location: [place];  Service: Plastics;  Laterality: N/A;     Current Outpatient Prescriptions  Medication Sig Dispense Refill  . ALPRAZolam (XANAX) 0.25 MG tablet Take 0.25 mg by mouth  as needed. .25  0  . Ascorbic Acid (VITAMIN C) 100 MG tablet Take 60 mg by mouth daily.    Marland Kitchen aspirin EC 325 MG EC tablet Take 1 tablet (325 mg total) by mouth daily.    Marland Kitchen atorvastatin (LIPITOR) 80 MG tablet Take 1 tablet (80 mg total) by mouth daily at 6 PM. 30 tablet 11  . docusate sodium (DOCU SOFT) 100 MG capsule Take 100 mg by mouth 2 (two) times daily.    . feeding supplement, ENSURE ENLIVE, (ENSURE ENLIVE) LIQD Take 237 mLs by mouth 3 (three)  times daily with meals. 237 mL 12  . ferrous Q000111Q C-folic acid (TRINSICON / FOLTRIN) capsule Take 1 capsule by mouth 2 (two) times daily. 60 capsule 3  . furosemide (LASIX) 20 MG tablet Take 1 tablet (20 mg total) by mouth daily as needed. Take with Potassium 30 tablet 6  . losartan-hydrochlorothiazide (HYZAAR) 50-12.5 MG tablet Take 1 tablet by mouth daily.    . metoprolol (LOPRESSOR) 50 MG tablet Take 75 mg by mouth 2 (two) times daily.    Marland Kitchen oxyCODONE (OXY IR/ROXICODONE) 5 MG immediate release tablet Take 1 tablet (5 mg total) by mouth every 4 (four) hours as needed for moderate pain. 30 tablet 0  . potassium chloride SA (K-DUR,KLOR-CON) 20 MEQ tablet Take 1 tablet (20 mEq total) by mouth daily. 90 tablet 4   No current facility-administered medications for this visit.    Allergies:   Other and Peanut-containing drug products    Social History:  The patient  reports that she has never smoked. She does not have any smokeless tobacco history on file. She reports that she does not drink alcohol or use illicit drugs.   Family History:  The patient's family history is negative for Colon cancer.    ROS: All other systems are reviewed and negative. Unless otherwise mentioned in H&P    PHYSICAL EXAM: VS:  BP 159/86 mmHg  Pulse 69  Ht 5\' 10"  (1.778 m)  Wt 160 lb (72.576 kg)  BMI 22.96 kg/m2  SpO2 98% , BMI Body mass index is 22.96 kg/(m^2). GEN: Well nourished, well developed, in no acute distress HEENT: normal Neck: no JVD, carotid bruits, or masses Cardiac: RRR; no murmurs, rubs, or gallops,no edema  Respiratory: Clear to auscultation bilaterally, normal work of breathing GI: soft, nontender, nondistended, + BS MS: no deformity or atrophymid sternal incision is healing well. Skin: warm and dry, no rash Neuro:  Strength and sensation are intact Psych: euthymic mood, full affect  Recent Labs: 08/11/2015: TSH 2.467 09/04/2015: Magnesium 1.6* 09/10/2015: ALT  16 09/14/2015: BUN 10; Creatinine, Ser 0.99; Hemoglobin 7.4*; Platelets 392; Potassium 3.6; Sodium 141    Lipid Panel    Component Value Date/Time   CHOL 157 08/11/2015 0611   TRIG 73 08/11/2015 0611   HDL 51 08/11/2015 0611   CHOLHDL 3.1 08/11/2015 0611   VLDL 15 08/11/2015 0611   LDLCALC 91 08/11/2015 0611      Wt Readings from Last 3 Encounters:  11/27/15 160 lb (72.576 kg)  11/25/15 163 lb 4.8 oz (74.072 kg)  10/29/15 163 lb 12.8 oz (74.299 kg)     ASSESSMENT AND PLAN:  1.  Hypertension: She is to start olmesartan HCTZ 50/12.5 by her primary care physician Dr. Maudie Mercury. Blood pressure has significantly improved.will not make any changes on this medicine at this time and she just has started it this morning. Dr. Maudie Mercury will now be in charge of her blood pressure  control, she is unable to see him for problems with this. Do not want to have to physicians managing blood pressure unless it becomes significantly elevated.  2. CAD: Status post coronary bypass grafting. The patient had a sternal wound dehiscence with infection.this is healed completely. No further signs of infection pain. Sternotomy site looks very healthy.   Current medicines are reviewed at length with the patient today.    Labs/ tests ordered today include:  No orders of the defined types were placed in this encounter.     Disposition:   FU with 4 months   Signed, [name], NP  11/27/2015 2:12 PM    [facility] [address], [place], [place] [address] Phone: [phone]; Fax: [phone]

## 2015-12-01 ENCOUNTER — Encounter (HOSPITAL_COMMUNITY)
Admission: RE | Admit: 2015-12-01 | Discharge: 2015-12-01 | Disposition: A | Discharge status: Home / Self Care | Payer: Medicare PPO | Source: Ambulatory Visit | Attending: Cardiothoracic Surgery | Admitting: Cardiothoracic Surgery

## 2015-12-01 DIAGNOSIS — Z951 Presence of aortocoronary bypass graft: Secondary | ICD-10-CM | POA: Diagnosis present

## 2015-12-02 ENCOUNTER — Other Ambulatory Visit (HOSPITAL_COMMUNITY): Payer: Self-pay | Admitting: Internal Medicine

## 2015-12-02 DIAGNOSIS — M8580 Other specified disorders of bone density and structure, unspecified site: Secondary | ICD-10-CM

## 2015-12-02 DIAGNOSIS — T380X5A Adverse effect of glucocorticoids and synthetic analogues, initial encounter: Principal | ICD-10-CM

## 2015-12-03 ENCOUNTER — Ambulatory Visit (HOSPITAL_COMMUNITY)
Admission: RE | Admit: 2015-12-03 | Discharge: 2015-12-03 | Disposition: A | Discharge status: Home / Self Care | Payer: Medicare PPO | Source: Ambulatory Visit | Attending: Internal Medicine | Admitting: Internal Medicine

## 2015-12-03 ENCOUNTER — Encounter (HOSPITAL_COMMUNITY)
Admission: RE | Admit: 2015-12-03 | Discharge: 2015-12-03 | Disposition: A | Discharge status: Home / Self Care | Payer: Medicare PPO | Source: Ambulatory Visit | Attending: Cardiothoracic Surgery | Admitting: Cardiothoracic Surgery

## 2015-12-03 DIAGNOSIS — Z951 Presence of aortocoronary bypass graft: Secondary | ICD-10-CM | POA: Diagnosis not present

## 2015-12-03 DIAGNOSIS — Z13820 Encounter for screening for osteoporosis: Secondary | ICD-10-CM | POA: Insufficient documentation

## 2015-12-03 DIAGNOSIS — T380X5A Adverse effect of glucocorticoids and synthetic analogues, initial encounter: Secondary | ICD-10-CM

## 2015-12-03 DIAGNOSIS — M8580 Other specified disorders of bone density and structure, unspecified site: Secondary | ICD-10-CM

## 2015-12-03 DIAGNOSIS — M85851 Other specified disorders of bone density and structure, right thigh: Secondary | ICD-10-CM | POA: Diagnosis not present

## 2015-12-05 ENCOUNTER — Encounter (HOSPITAL_COMMUNITY)
Admission: RE | Admit: 2015-12-05 | Discharge: 2015-12-05 | Disposition: A | Discharge status: Home / Self Care | Payer: Medicare PPO | Source: Ambulatory Visit | Attending: Cardiothoracic Surgery | Admitting: Cardiothoracic Surgery

## 2015-12-05 DIAGNOSIS — Z951 Presence of aortocoronary bypass graft: Secondary | ICD-10-CM | POA: Diagnosis not present

## 2015-12-08 ENCOUNTER — Encounter (HOSPITAL_COMMUNITY)
Admission: RE | Admit: 2015-12-08 | Discharge: 2015-12-08 | Disposition: A | Discharge status: Home / Self Care | Payer: Medicare PPO | Source: Ambulatory Visit | Attending: Cardiothoracic Surgery | Admitting: Cardiothoracic Surgery

## 2015-12-08 ENCOUNTER — Ambulatory Visit (HOSPITAL_COMMUNITY)
Admission: RE | Admit: 2015-12-08 | Discharge: 2015-12-08 | Disposition: A | Discharge status: Home / Self Care | Payer: Medicare PPO | Source: Ambulatory Visit | Attending: Internal Medicine | Admitting: Internal Medicine

## 2015-12-08 DIAGNOSIS — Z1231 Encounter for screening mammogram for malignant neoplasm of breast: Secondary | ICD-10-CM

## 2015-12-08 DIAGNOSIS — Z951 Presence of aortocoronary bypass graft: Secondary | ICD-10-CM | POA: Diagnosis not present

## 2015-12-10 ENCOUNTER — Encounter (HOSPITAL_COMMUNITY)
Admission: RE | Admit: 2015-12-10 | Discharge: 2015-12-10 | Disposition: A | Discharge status: Home / Self Care | Payer: Medicare PPO | Source: Ambulatory Visit | Attending: Cardiothoracic Surgery | Admitting: Cardiothoracic Surgery

## 2015-12-10 DIAGNOSIS — Z951 Presence of aortocoronary bypass graft: Secondary | ICD-10-CM | POA: Diagnosis not present

## 2015-12-12 ENCOUNTER — Encounter (HOSPITAL_COMMUNITY)
Admission: RE | Admit: 2015-12-12 | Discharge: 2015-12-12 | Disposition: A | Discharge status: Home / Self Care | Payer: Medicare PPO | Source: Ambulatory Visit | Attending: Cardiothoracic Surgery | Admitting: Cardiothoracic Surgery

## 2015-12-12 DIAGNOSIS — Z951 Presence of aortocoronary bypass graft: Secondary | ICD-10-CM | POA: Diagnosis not present

## 2015-12-15 ENCOUNTER — Encounter (HOSPITAL_COMMUNITY)
Admission: RE | Admit: 2015-12-15 | Discharge: 2015-12-15 | Disposition: A | Discharge status: Home / Self Care | Payer: Medicare PPO | Source: Ambulatory Visit | Attending: Cardiothoracic Surgery | Admitting: Cardiothoracic Surgery

## 2015-12-15 DIAGNOSIS — Z951 Presence of aortocoronary bypass graft: Secondary | ICD-10-CM | POA: Diagnosis not present

## 2015-12-17 ENCOUNTER — Encounter (HOSPITAL_COMMUNITY)
Admission: RE | Admit: 2015-12-17 | Discharge: 2015-12-17 | Disposition: A | Discharge status: Home / Self Care | Payer: Medicare PPO | Source: Ambulatory Visit | Attending: Cardiothoracic Surgery | Admitting: Cardiothoracic Surgery

## 2015-12-17 DIAGNOSIS — Z951 Presence of aortocoronary bypass graft: Secondary | ICD-10-CM | POA: Diagnosis not present

## 2015-12-17 NOTE — Progress Notes (Signed)
Daily Session Note  Patient Details  Name: [name] [name] MRN: [id number] Date of Birth: 16-Jul-1941 Referring Provider:        CARDIAC REHAB PHASE II ORIENTATION from 11/25/2015 in [place]   Referring Provider  [name]      Encounter Date: 12/17/2015  Check In:     Session Check In - 12/17/15 0930    Check-In   Location [facility]   Staff Present Kjell Brannen Angelina Pih, MS, EP, [facility], Exercise Physiologist;Christy Oletta Lamas, RN, BSN;Other  Nils Flack, EP   Supervising physician immediately available to respond to emergencies See telemetry face sheet for immediately available MD   Medication changes reported     No   Fall or balance concerns reported    No   Warm-up and Cool-down Performed as group-led instruction   Resistance Training Performed Yes   VAD Patient? No   Pain Assessment   Currently in Pain? No/denies   Pain Score 0-No pain   Multiple Pain Sites No      Capillary Blood Glucose: No results found for this or any previous visit (from the past 24 hour(s)).   Goals Met:  Independence with exercise equipment Improved SOB with ADL's Exercise tolerated well No report of cardiac concerns or symptoms Strength training completed today  Goals Unmet:  RPE BP HR  Comments: Patient is progressing appropriately. Check out time: 10:30am   Dr. Kate Sable is Medical Director for [facility] and Pulmonary Rehab.

## 2015-12-19 ENCOUNTER — Encounter (HOSPITAL_COMMUNITY)
Admission: RE | Admit: 2015-12-19 | Discharge: 2015-12-19 | Disposition: A | Discharge status: Home / Self Care | Payer: Medicare PPO | Source: Ambulatory Visit | Attending: Cardiothoracic Surgery | Admitting: Cardiothoracic Surgery

## 2015-12-19 DIAGNOSIS — Z951 Presence of aortocoronary bypass graft: Secondary | ICD-10-CM

## 2015-12-19 DIAGNOSIS — I2119 ST elevation (STEMI) myocardial infarction involving other coronary artery of inferior wall: Secondary | ICD-10-CM

## 2015-12-19 NOTE — Progress Notes (Signed)
Daily Session Note  Patient Details  Name: ALILA SOTERO MRN: [id number] Date of Birth: 1941/02/22 Referring Provider:        CARDIAC REHAB PHASE II ORIENTATION from 11/25/2015 in [place]   Referring Provider  [name]      Encounter Date: 12/19/2015  Check In:     Session Check In - 12/19/15 0930    Check-In   Location [facility]   Staff Present Elmyra Banwart Angelina Pih, MS, EP, [facility], Exercise Physiologist;Christy Oletta Lamas, RN, BSN;Other  Nils Flack, EP   Supervising physician immediately available to respond to emergencies See telemetry face sheet for immediately available MD   Medication changes reported     No   Fall or balance concerns reported    No   Warm-up and Cool-down Performed as group-led instruction   Resistance Training Performed Yes   VAD Patient? No   Pain Assessment   Currently in Pain? No/denies   Pain Score 0-No pain   Multiple Pain Sites No      Capillary Blood Glucose: No results found for this or any previous visit (from the past 24 hour(s)).   Goals Met:  Independence with exercise equipment Exercise tolerated well No report of cardiac concerns or symptoms Strength training completed today  Goals Unmet:  RPE BP HR  Comments: Patient is progressing appropriately. Check out: 1030   Dr. Kate Sable is Medical Director for [facility] Cardiac and Pulmonary Rehab.

## 2015-12-21 NOTE — Progress Notes (Signed)
Cardiac Individual Treatment Plan  Patient Details  Name: [name] [name] MRN: [id number] Date of Birth: January 16, 1941 Referring Provider:        CARDIAC REHAB PHASE II ORIENTATION from 11/25/2015 in [place]   Referring Provider  [name]      Initial Encounter Date:       CARDIAC REHAB PHASE II ORIENTATION from 11/25/2015 in [place] [facility]   Date  11/25/15   Referring Provider  [name]      Visit Diagnosis: S/P CABG x 3  Myocardial infarction involving other coronary artery of inferior wall ([place])  Patient's Home Medications on Admission:  Current outpatient prescriptions:  .  Ascorbic Acid (VITAMIN C) 100 MG tablet, Take 60 mg by mouth daily., Disp: , Rfl:  .  aspirin EC 325 MG EC tablet, Take 1 tablet (325 mg total) by mouth daily., Disp: , Rfl:  .  atorvastatin (LIPITOR) 80 MG tablet, Take 1 tablet (80 mg total) by mouth daily at 6 PM., Disp: 30 tablet, Rfl: 11 .  docusate sodium (DOCU SOFT) 100 MG capsule, Take 100 mg by mouth 2 (two) times daily., Disp: , Rfl:  .  feeding supplement, ENSURE ENLIVE, (ENSURE ENLIVE) LIQD, Take 237 mLs by mouth 3 (three) times daily with meals., Disp: 237 mL, Rfl: 12 .  ferrous [id number] C-folic acid (TRINSICON / FOLTRIN) capsule, Take 1 capsule by mouth 2 (two) times daily., Disp: 60 capsule, Rfl: 3 .  furosemide (LASIX) 20 MG tablet, Take 1 tablet (20 mg total) by mouth daily as needed. Take with Potassium, Disp: 30 tablet, Rfl: 6 .  losartan-hydrochlorothiazide (HYZAAR) 50-12.5 MG tablet, Take 1 tablet by mouth daily., Disp: , Rfl:  .  metoprolol (LOPRESSOR) 50 MG tablet, Take 75 mg by mouth 2 (two) times daily., Disp: , Rfl:  .  potassium chloride SA (K-DUR,KLOR-CON) 20 MEQ tablet, Take 1 tablet (20 mEq total) by mouth daily., Disp: 90 tablet, Rfl: 4  Past Medical History: Past Medical History  Diagnosis Date  . Hypertension   . Anxiety about health 08/28/2015    Tobacco Use: History   Smoking status  . Never Smoker   Smokeless tobacco  . Not on file    Labs:     Recent Review Flowsheet Data    Labs for ITP Cardiac and Pulmonary Rehab Latest Ref Rng 08/15/2015 08/15/2015 08/16/2015 08/17/2015 09/07/2015   PHART 7.350 - 7.450 - - - - 7.461(H)   PCO2ART 35.0 - 45.0 mmHg - - - - 33.7(L)   HCO3 20.0 - 24.0 mEq/L - - - - 24.0   TCO2 0 - 100 mmol/L 24 - - - 25   O2SAT - - 69.7 72.0 65.0 94.0      Capillary Blood Glucose: Lab Results  Component Value Date   GLUCAP 99 09/14/2015   GLUCAP 120* 09/13/2015   GLUCAP 102* 09/13/2015   GLUCAP 133* 09/13/2015   GLUCAP 108* 09/13/2015     Exercise Target Goals:    Exercise Program Goal: Individual exercise prescription set with THRR, safety & activity barriers. Participant demonstrates ability to understand and report RPE using BORG scale, to self-measure pulse accurately, and to acknowledge the importance of the exercise prescription.  Exercise Prescription Goal: Starting with aerobic activity 30 plus minutes a day, 3 days per week for initial exercise prescription. Provide home exercise prescription and guidelines that participant acknowledges understanding prior to discharge.  Activity Barriers & Risk Stratification:     Activity Barriers &  Cardiac Risk Stratification - 11/25/15 1629    Activity Barriers & Cardiac Risk Stratification   Activity Barriers Other (comment)  BP was extremely high   Comments BP was extremely elevated.    Cardiac Risk Stratification High      6 Minute Walk:   Initial Exercise Prescription:     Initial Exercise Prescription - 11/25/15 1600    Date of Initial Exercise RX and Referring Provider   Date 11/25/15   Referring Provider [name]   Treadmill   MPH 1.5   Grade 0   Minutes 15   METs 2.14   NuStep   Level 2   Watts 25   Minutes 15   METs 1.9   Prescription Details   Frequency (times per week) 3   Duration Progress to 30 minutes of continuous aerobic without  signs/symptoms of physical distress   Intensity   THRR REST +  30   THRR 40-80% of Max Heartrate 101-116-130   Ratings of Perceived Exertion 11-15   Perceived Dyspnea 0-4   Progression   Progression Continue to progress workloads to maintain intensity without signs/symptoms of physical distress.   Resistance Training   Training Prescription Yes   Weight 1   Reps 10-12      Perform Capillary Blood Glucose checks as needed.  Exercise Prescription Changes:      Exercise Prescription Changes      12/21/15 1000           Exercise Review   Progression Yes       Response to Exercise   Blood Pressure (Admit) 160/80 mmHg       Blood Pressure (Exercise) 146/82 mmHg       Blood Pressure (Exit) 162/78 mmHg       Heart Rate (Admit) 65 bpm       Heart Rate (Exercise) 80 bpm       Heart Rate (Exit) 74 bpm       Rating of Perceived Exertion (Exercise) 11       Symptoms No       Comments Had to switch to Arm Crank instead of TM.        Duration Progress to 30 minutes of continuous aerobic without signs/symptoms of physical distress       Intensity Rest + 30       Progression   Progression Continue to progress workloads to maintain intensity without signs/symptoms of physical distress.       Resistance Training   Training Prescription Yes       Weight 2       Reps 10-12       Interval Training   Interval Training No       Treadmill   MPH 0       Grade 0       Minutes 0       METs 0       NuStep   Level 2       Watts 16       Minutes 15       METs 1.8       Arm/Foot Ergometer   Level 2.4       Watts 18       Minutes 15       METs 2.8       Home Exercise Plan   Plans to continue exercise at Home       Frequency Add 2 additional days to program exercise sessions.  Exercise Comments:      Exercise Comments      12/21/15 1052           Exercise Comments Had to switch patient from doing Treadmill to the Arm Erogometer due to not being strong enough to  walk the TM. We will go back to the TM once she has gained more strength and endurance. Patient is progressing appropriately.            Discharge Exercise Prescription (Final Exercise Prescription Changes):     Exercise Prescription Changes - 12/21/15 1000    Exercise Review   Progression Yes   Response to Exercise   Blood Pressure (Admit) 160/80 mmHg   Blood Pressure (Exercise) 146/82 mmHg   Blood Pressure (Exit) 162/78 mmHg   Heart Rate (Admit) 65 bpm   Heart Rate (Exercise) 80 bpm   Heart Rate (Exit) 74 bpm   Rating of Perceived Exertion (Exercise) 11   Symptoms No   Comments Had to switch to Arm Crank instead of TM.    Duration Progress to 30 minutes of continuous aerobic without signs/symptoms of physical distress   Intensity Rest + 30   Progression   Progression Continue to progress workloads to maintain intensity without signs/symptoms of physical distress.   Resistance Training   Training Prescription Yes   Weight 2   Reps 10-12   Interval Training   Interval Training No   Treadmill   MPH 0   Grade 0   Minutes 0   METs 0   NuStep   Level 2   Watts 16   Minutes 15   METs 1.8   Arm/Foot Ergometer   Level 2.4   Watts 18   Minutes 15   METs 2.8   Home Exercise Plan   Plans to continue exercise at Home   Frequency Add 2 additional days to program exercise sessions.      Nutrition:  Target Goals: Understanding of nutrition guidelines, daily intake of sodium 1500mg , cholesterol 200mg , calories 30% from fat and 7% or less from saturated fats, daily to have 5 or more servings of fruits and vegetables.  Biometrics:     Pre Biometrics - 11/25/15 1646    Pre Biometrics   Waist Circumference 36 inches   Hip Circumference 37.5 inches   Waist to Hip Ratio 0.96 %   Triceps Skinfold 18 mm   Grip Strength 44 kg   Flexibility 12.8 in   Single Leg Stand 4 seconds       Nutrition Therapy Plan and Nutrition Goals:     Nutrition Therapy & Goals - 11/25/15  1645    Intervention Plan   Intervention Nutrition handout(s) given to patient.   Expected Outcomes Short Term Goal: Understand basic principles of dietary content, such as calories, fat, sodium, cholesterol and nutrients.;Long Term Goal: Adherence to prescribed nutrition plan.      Nutrition Discharge: Rate Your Plate Scores:     Nutrition Assessments - 11/25/15 1648    MEDFICTS Scores   Pre Score 6      Nutrition Goals Re-Evaluation:   Psychosocial: Target Goals: Acknowledge presence or absence of depression, maximize coping skills, provide positive support system. Participant is able to verbalize types and ability to use techniques and skills needed for reducing stress and depression.  Initial Review & Psychosocial Screening:     Initial Psych Review & Screening - 11/25/15 [place]? Yes   Comments Concerned  about BP being extremely elevated   Barriers   Psychosocial barriers to participate in program There are no identifiable barriers or psychosocial needs.   Screening Interventions   Interventions Encouraged to exercise      Quality of Life Scores:     Quality of Life - 11/25/15 1728    Quality of Life Scores   Health/Function Pre 22.13 %   Socioeconomic Pre 20.6 %   Psych/Spiritual Pre 23.36 %   Family Pre 25.13 %   GLOBAL Pre 22.59 %      PHQ-9:     Recent Review Flowsheet Data    Depression screen [facility] 2/9 11/25/2015   Decreased Interest 0   Down, Depressed, Hopeless 0   PHQ - 2 Score 0   Altered sleeping 1   Tired, decreased energy 1   Change in appetite 0   Feeling bad or failure about yourself  0   Trouble concentrating 0   Moving slowly or fidgety/restless 0   Suicidal thoughts 0   PHQ-9 Score 2   Difficult doing work/chores Somewhat difficult      Psychosocial Evaluation and Intervention:   Psychosocial Re-Evaluation:     Psychosocial Re-Evaluation      12/15/15 1455           Psychosocial  Re-Evaluation   Interventions Encouraged to attend Cardiac Rehabilitation for the exercise       Continued Psychosocial Services Needed No          Vocational Rehabilitation: Provide vocational rehab assistance to qualifying candidates.   Vocational Rehab Evaluation & Intervention:     Vocational Rehab - 11/25/15 1637    Initial Vocational Rehab Evaluation & Intervention   Assessment shows need for Vocational Rehabilitation No      Education: Education Goals: Education classes will be provided on a weekly basis, covering required topics. Participant will state understanding/return demonstration of topics presented.  Learning Barriers/Preferences:     Learning Barriers/Preferences - 11/25/15 1635    Learning Barriers/Preferences   Learning Barriers None   Learning Preferences Skilled Demonstration  Kinesthetic/Doing      Education Topics: Hypertension, Hypertension Reduction -Define heart disease and high blood pressure. Discus how high blood pressure affects the body and ways to reduce high blood pressure.   Exercise and Your Heart -Discuss why it is important to exercise, the FITT principles of exercise, normal and abnormal responses to exercise, and how to exercise safely.      CARDIAC REHAB PHASE II EXERCISE from 12/17/2015 in [facility]   Date  12/03/15   Educator  DC   Instruction Review Code  2- meets goals/outcomes      Angina -Discuss definition of angina, causes of angina, treatment of angina, and how to decrease risk of having angina.      CARDIAC REHAB PHASE II EXERCISE from 12/17/2015 in [facility]   Date  12/10/15   Educator  -- [D Davyd Podgorski]   Instruction Review Code  2- meets goals/outcomes      Cardiac Medications -Review what the following cardiac medications are used for, how they affect the body, and side effects that may occur when taking the medications.  Medications include Aspirin, Beta blockers,  calcium channel blockers, ACE Inhibitors, angiotensin receptor blockers, diuretics, digoxin, and antihyperlipidemics.          CARDIAC REHAB PHASE II EXERCISE from 12/17/2015 in [facility]   Date  12/17/15   Educator  D. Geo Slone  Instruction Review Code  2- meets goals/outcomes      Congestive Heart Failure -Discuss the definition of CHF, how to live with CHF, the signs and symptoms of CHF, and how keep track of weight and sodium intake.   Heart Disease and Intimacy -Discus the effect sexual activity has on the heart, how changes occur during intimacy as we age, and safety during sexual activity.   Smoking Cessation / COPD -Discuss different methods to quit smoking, the health benefits of quitting smoking, and the definition of COPD.   Nutrition I: Fats -Discuss the types of cholesterol, what cholesterol does to the heart, and how cholesterol levels can be controlled.   Nutrition II: Labels -Discuss the different components of food labels and how to read food label   Heart Parts and Heart Disease -Discuss the anatomy of the heart, the pathway of blood circulation through the heart, and these are affected by heart disease.   Stress I: Signs and Symptoms -Discuss the causes of stress, how stress may lead to anxiety and depression, and ways to limit stress.   Stress II: Relaxation -Discuss different types of relaxation techniques to limit stress.   Warning Signs of Stroke / TIA -Discuss definition of a stroke, what the signs and symptoms are of a stroke, and how to identify when someone is having stroke.   Knowledge Questionnaire Score:     Knowledge Questionnaire Score - 11/25/15 1637    Knowledge Questionnaire Score   Pre Score 19/24      Core Components/Risk Factors/Patient Goals at Admission:     Personal Goals and Risk Factors at Admission - 11/25/15 1717    Core Components/Risk Factors/Patient Goals on Admission    Weight Management  Weight Maintenance   Sedentary --  No   Increase Strength and Stamina Yes   Intervention Provide advice, education, support and counseling about physical activity/exercise needs.;Develop an individualized exercise prescription for aerobic and resistive training based on initial evaluation findings, risk stratification, comorbidities and participant's personal goals.   Expected Outcomes Achievement of increased cardiorespiratory fitness and enhanced flexibility, muscular endurance and strength shown through measurements of functional capacity and personal statement of participant.   Tobacco Cessation --  NO   Diabetes --  Patient says no. AIC is 6.1.    Heart Failure --  NO   Hypertension Yes   Intervention Provide education on lifestyle modifcations including regular physical activity/exercise, weight management, moderate sodium restriction and increased consumption of fresh fruit, vegetables, and low fat dairy, alcohol moderation, and smoking cessation.;Monitor prescription use compliance.   Expected Outcomes Short Term: Continued assessment and intervention until BP is < 140/31mm HG in hypertensive participants. < 130/91mm HG in hypertensive participants with diabetes, heart failure or chronic kidney disease.;Long Term: Maintenance of blood pressure at goal levels.   Lipids --  WNL   Stress --  None   Personal Goal Other Yes   Personal Goal Gain strength and stamina, Get back to ADL's without elevated BP.   Intervention Check to see if taking the correct dose of medication for BP.   Expected Outcomes Become stronger and to see BP come down to within normal limits.       Core Components/Risk Factors/Patient Goals Review:      Goals and Risk Factor Review      12/15/15 1452           Core Components/Risk Factors/Patient Goals Review   Personal Goals Review Weight Management/Obesity;Increase Strength and Stamina;Hypertension  Review lost 2 lbs. patient stated she feels  stronger and can tell the exercise is working for her.  BP is still hypertensive but better than when first started. Systolic when started was 180s. Now is around 150s       Expected Outcomes Patient to get stronger, have controlled BP, lose weight          Core Components/Risk Factors/Patient Goals at Discharge (Final Review):      Goals and Risk Factor Review - 12/15/15 1452    Core Components/Risk Factors/Patient Goals Review   Personal Goals Review Weight Management/Obesity;Increase Strength and Stamina;Hypertension   Review lost 2 lbs. patient stated she feels stronger and can tell the exercise is working for her.  BP is still hypertensive but better than when first started. Systolic when started was 180s. Now is around 150s   Expected Outcomes Patient to get stronger, have controlled BP, lose weight      ITP Comments:     ITP Comments      11/25/15 1715           ITP Comments Unable to due patients walk test on orientation day due to BP being too elevated. Will do the walk test on the day she comes back to exercise. Will send ITP for initial signature without initial walk test results. Weill add results later date.           Comments: Patient is doing well with program. Will continue to monitor for progress.

## 2015-12-22 ENCOUNTER — Encounter (HOSPITAL_COMMUNITY): Payer: Medicare PPO

## 2015-12-23 ENCOUNTER — Other Ambulatory Visit: Payer: Self-pay | Admitting: Cardiothoracic Surgery

## 2015-12-23 DIAGNOSIS — Z951 Presence of aortocoronary bypass graft: Secondary | ICD-10-CM

## 2015-12-24 ENCOUNTER — Encounter (HOSPITAL_COMMUNITY): Payer: Medicare PPO

## 2015-12-24 ENCOUNTER — Ambulatory Visit
Admission: RE | Admit: 2015-12-24 | Discharge: 2015-12-24 | Disposition: A | Discharge status: Home / Self Care | Payer: Medicare PPO | Source: Ambulatory Visit | Attending: Cardiothoracic Surgery | Admitting: Cardiothoracic Surgery

## 2015-12-24 ENCOUNTER — Ambulatory Visit (INDEPENDENT_AMBULATORY_CARE_PROVIDER_SITE_OTHER): Payer: Medicare PPO | Admitting: Cardiothoracic Surgery

## 2015-12-24 VITALS — BP 156/88 | HR 65 | Resp 20 | Ht 70.0 in | Wt 160.0 lb

## 2015-12-24 DIAGNOSIS — T8132XD Disruption of internal operation (surgical) wound, not elsewhere classified, subsequent encounter: Secondary | ICD-10-CM | POA: Diagnosis not present

## 2015-12-24 DIAGNOSIS — Z951 Presence of aortocoronary bypass graft: Secondary | ICD-10-CM | POA: Diagnosis not present

## 2015-12-24 NOTE — Progress Notes (Signed)
PCP is [name], MD Referring Provider is Sueanne Margarita, MD  Chief Complaint  Patient presents with  . Routine Post Op    2 month with CXR,f/u wound check, s/p CABG    [id number] office. The patient is 4 months postop CABG and almost 3 months postop right pectoralis flap reconstruction of nonhealing sternal incision. She's going to rehabilitation. She denies any cardiac symptoms. She still has some minor aches and pains in her chest. The sternal incision is well-healed. His back to doing artwork at home and progressing at cardiac rehabilitation   Past Medical History  Diagnosis Date  . Hypertension   . Anxiety about health 08/28/2015    Past Surgical History  Procedure Laterality Date  . Back surgery    . Abdominal hysterectomy    . Appendectomy    . Colonoscopy N/A 03/29/2013    Procedure: COLONOSCOPY;  Surgeon: Rogene Houston, MD;  [facility];  Service: Endoscopy;  Laterality: N/A;  930  . Cardiac catheterization N/A 08/10/2015    Procedure: Left Heart Cath and Coronary Angiography;  Surgeon: [name], MD;  Location: [facility];  Service: Cardiovascular;  Laterality: N/A;  . Cardiac catheterization  08/10/2015    Procedure: Coronary Balloon Angioplasty;  Surgeon: [name], MD;  Location: [facility];  Service: Cardiovascular;;  . Coronary artery bypass graft N/A 08/14/2015    Procedure: CORONARY ARTERY BYPASS GRAFTING (CABG)X3 LIMA-LAD; SVG-RAMUS; SVG-PD TRANSESOPHAGEAL ECHOCARDIOGRAM (TEE);  Surgeon: [name], MD;  Location: [facility];  Service: Open Heart Surgery;  Laterality: N/A;  . Tee without cardioversion N/A 08/14/2015    Procedure: TRANSESOPHAGEAL ECHOCARDIOGRAM (TEE);  Surgeon: [name], MD;  Location: [place] [place];  Service: Open Heart Surgery;  Laterality: N/A;  . Sternal wound debridement N/A 09/01/2015    Procedure: STERNAL WOUND DEBRIDEMENT;  Surgeon: [name], MD;  Location: [facility];  Service: Thoracic;   Laterality: N/A;  . Sternal wound debridement N/A 09/04/2015    Procedure: STERNAL WOUND DEBRIDEMENT;  Surgeon: [name], MD;  Location: [place];  Service: Thoracic;  Laterality: N/A;  . Application of wound vac N/A 09/04/2015    Procedure: WOUND VAC CHANGE;  Surgeon: [name], MD;  Location: [place];  Service: Thoracic;  Laterality: N/A;  . Sternal wound debridement N/A 09/08/2015    Procedure: STERNAL WOUND DEBRIDEMENT;  Surgeon: [name], MD;  Location: [place];  Service: Thoracic;  Laterality: N/A;  . Chest tube insertion Left 09/08/2015    Procedure: CHEST TUBE INSERTION;  Surgeon: [name], MD;  Location: [place];  Service: Thoracic;  Laterality: Left;  . Pectoralis flap N/A 09/08/2015    Procedure: Muscle FLAP;  Surgeon: [name], DO;  Location: [place];  Service: Plastics;  Laterality: N/A;    Family History  Problem Relation Age of Onset  . Colon cancer Neg Hx     Social History Social History  Substance Use Topics  . Smoking status: Never Smoker   . Smokeless tobacco: Not on file  . Alcohol Use: No    Current Outpatient Prescriptions  Medication Sig Dispense Refill  . aspirin EC 325 MG EC tablet Take 1 tablet (325 mg total) by mouth daily.    Marland Kitchen atorvastatin (LIPITOR) 80 MG tablet Take 1 tablet (80 mg total) by mouth daily at 6 PM. 30 tablet 11  . docusate sodium (DOCU SOFT) 100 MG capsule Take 100 mg by mouth  2 (two) times daily.    Marland Kitchen losartan-hydrochlorothiazide (HYZAAR) 50-12.5 MG tablet Take 1 tablet by mouth daily.    . Metoprolol Tartrate 75 MG TABS Take 75 mg by mouth 2 (two) times daily.    . Multiple Vitamin (MULTIVITAMIN) tablet Take 1 tablet by mouth daily.     No current facility-administered medications for this visit.    Allergies  Allergen Reactions  . Other Hives and Itching    Food Dye Red Dye #40  . Peanut-Containing Drug Products Rash    Mild rash    Review of Systems  No significant complaints Filling better and  stronger with rehabilitation and time.  BP 156/88 mmHg  Pulse 65  Resp 20  Ht 5\' 10"  (1.778 m)  Wt 160 lb (72.576 kg)  BMI 22.96 kg/m2  SpO2 96% Physical Exam Alert and comfortable Lungs clear Sternum well-healed Heart rhythm regular murmur No pedal edema  Diagnostic Tests: No chest x-ray  Impression: Patient is recovered from her cardiac and chest wall surgical procedures. She is without restrictions and return as needed.  Plan:return as needed   Len Childs, MD [facility] [phone]

## 2015-12-26 ENCOUNTER — Encounter (HOSPITAL_COMMUNITY)
Admission: RE | Admit: 2015-12-26 | Discharge: 2015-12-26 | Disposition: A | Discharge status: Home / Self Care | Payer: Medicare PPO | Source: Ambulatory Visit | Attending: Cardiothoracic Surgery | Admitting: Cardiothoracic Surgery

## 2015-12-26 DIAGNOSIS — Z951 Presence of aortocoronary bypass graft: Secondary | ICD-10-CM | POA: Insufficient documentation

## 2015-12-26 NOTE — Progress Notes (Signed)
Daily Session Note  Patient Details  Name: LAURALIE BLACKSHER MRN: [id number] Date of Birth: [birth year] Referring Provider:        CARDIAC REHAB PHASE II ORIENTATION from 11/25/2015 in [place]   Referring Provider  [name]      Encounter Date: 12/26/2015  Check In:     Session Check In - 12/26/15 0930    Check-In   Location [facility]   Staff Present Iliana Hutt Angelina Pih, MS, EP, [facility], Exercise Physiologist;Christy Oletta Lamas, RN, BSN;Other   Supervising physician immediately available to respond to emergencies See telemetry face sheet for immediately available MD   Medication changes reported     No   Fall or balance concerns reported    No   Warm-up and Cool-down Performed as group-led instruction   Resistance Training Performed Yes   VAD Patient? No   Pain Assessment   Currently in Pain? No/denies   Multiple Pain Sites No      Capillary Blood Glucose: No results found for this or any previous visit (from the past 24 hour(s)).   Goals Met:  Independence with exercise equipment Exercise tolerated well No report of cardiac concerns or symptoms Strength training completed today  Goals Unmet:  Not Applicable  Comments: Check out: 10:30   Dr. Kate Sable is Medical Director for [facility] and Pulmonary Rehab.

## 2015-12-29 ENCOUNTER — Encounter (HOSPITAL_COMMUNITY)
Admission: RE | Admit: 2015-12-29 | Discharge: 2015-12-29 | Disposition: A | Discharge status: Home / Self Care | Payer: Medicare PPO | Source: Ambulatory Visit | Attending: Cardiothoracic Surgery | Admitting: Cardiothoracic Surgery

## 2015-12-29 DIAGNOSIS — Z951 Presence of aortocoronary bypass graft: Secondary | ICD-10-CM | POA: Diagnosis not present

## 2015-12-29 NOTE — Progress Notes (Signed)
Daily Session Note  Patient Details  Name: TZIPORA MCINROY MRN: [id number] Date of Birth: March 28, 1941 Referring Provider:        CARDIAC REHAB PHASE II ORIENTATION from 11/25/2015 in [place]   Referring Provider  [name]      Encounter Date: 12/29/2015  Check In:     Session Check In - 12/29/15 1401    Check-In   Location [facility]   Staff Present [place] Angelina Pih, MS, EP, [facility], Exercise Physiologist;Christy Oletta Lamas, RN, BSN;Other  Nils Flack, EP   Supervising physician immediately available to respond to emergencies See telemetry face sheet for immediately available MD   Medication changes reported     No   Fall or balance concerns reported    No   Warm-up and Cool-down Performed as group-led instruction   Resistance Training Performed Yes   VAD Patient? No   Pain Assessment   Currently in Pain? No/denies   Pain Score 0-No pain   Multiple Pain Sites No      Capillary Blood Glucose: No results found for this or any previous visit (from the past 24 hour(s)).   Goals Met:  Independence with exercise equipment Exercise tolerated well No report of cardiac concerns or symptoms Strength training completed today  Goals Unmet:  Not Applicable  Comments: Check out 10:30   Dr. Kate Sable is Medical Director for [place] and Pulmonary Rehab.

## 2015-12-31 ENCOUNTER — Encounter (HOSPITAL_COMMUNITY)
Admission: RE | Admit: 2015-12-31 | Discharge: 2015-12-31 | Disposition: A | Discharge status: Home / Self Care | Payer: Medicare PPO | Source: Ambulatory Visit | Attending: Cardiothoracic Surgery | Admitting: Cardiothoracic Surgery

## 2015-12-31 DIAGNOSIS — Z951 Presence of aortocoronary bypass graft: Secondary | ICD-10-CM | POA: Diagnosis not present

## 2015-12-31 NOTE — Progress Notes (Signed)
Daily Session Note  Patient Details  Name: [name] [name] MRN: [id number] Date of Birth: [birth year] Referring Provider:        CARDIAC REHAB PHASE II ORIENTATION from 11/25/2015 in [place]   Referring Provider  [name]      Encounter Date: 12/31/2015  Check In:     Session Check In - 12/31/15 0930    Check-In   Location [facility]   Staff Present Sharmeka Palmisano Angelina Pih, MS, EP, [facility], Exercise Physiologist;Christy Oletta Lamas, RN, BSN;Other  Nils Flack, EP   Supervising physician immediately available to respond to emergencies See telemetry face sheet for immediately available MD   Medication changes reported     No   Fall or balance concerns reported    No   Warm-up and Cool-down Performed as group-led instruction   Resistance Training Performed Yes   VAD Patient? No   Pain Assessment   Currently in Pain? No/denies   Pain Score 0-No pain   Multiple Pain Sites No      Capillary Blood Glucose: No results found for this or any previous visit (from the past 24 hour(s)).   Goals Met:  Independence with exercise equipment Exercise tolerated well No report of cardiac concerns or symptoms Strength training completed today  Goals Unmet:  Not Applicable  Comments: Check out 1030    Dr. Kate Sable is Medical Director for [place] and Pulmonary Rehab.

## 2016-01-02 ENCOUNTER — Encounter (HOSPITAL_COMMUNITY)
Admission: RE | Admit: 2016-01-02 | Discharge: 2016-01-02 | Disposition: A | Discharge status: Home / Self Care | Payer: Medicare PPO | Source: Ambulatory Visit | Attending: Cardiothoracic Surgery | Admitting: Cardiothoracic Surgery

## 2016-01-02 DIAGNOSIS — Z951 Presence of aortocoronary bypass graft: Secondary | ICD-10-CM | POA: Diagnosis not present

## 2016-01-02 NOTE — Progress Notes (Signed)
Daily Session Note  Patient Details  Name: [name] [name] MRN: [id number] Date of Birth: Nov 04, 1940 Referring Provider:        CARDIAC REHAB PHASE II ORIENTATION from 11/25/2015 in [name]   Referring Provider  [name]      Encounter Date: 01/02/2016  Check In:     Session Check In - 01/02/16 0947    Check-In   Location [facility]   Staff Present Diane Angelina Pih, MS, EP, [facility], Exercise Physiologist;Gregory Luther Parody, BS, EP, Exercise Physiologist;Kalib Bhagat Oletta Lamas, RN, BSN   Supervising physician immediately available to respond to emergencies See telemetry face sheet for immediately available MD   Medication changes reported     No   Fall or balance concerns reported    No   Warm-up and Cool-down Performed as group-led instruction   Resistance Training Performed Yes   VAD Patient? No   Pain Assessment   Currently in Pain? No/denies      Capillary Blood Glucose: No results found for this or any previous visit (from the past 24 hour(s)).   Goals Met:  Independence with exercise equipment Exercise tolerated well No report of cardiac concerns or symptoms Strength training completed today  Goals Unmet:  Not Applicable  Comments: check out 1030   Dr. Kate Sable is Medical Director for [place] and Pulmonary Rehab.

## 2016-01-05 ENCOUNTER — Encounter (HOSPITAL_COMMUNITY)
Admission: RE | Admit: 2016-01-05 | Discharge: 2016-01-05 | Disposition: A | Discharge status: Home / Self Care | Payer: Medicare PPO | Source: Ambulatory Visit | Attending: Cardiothoracic Surgery | Admitting: Cardiothoracic Surgery

## 2016-01-05 DIAGNOSIS — Z951 Presence of aortocoronary bypass graft: Secondary | ICD-10-CM | POA: Diagnosis not present

## 2016-01-05 NOTE — Progress Notes (Signed)
Daily Session Note  Patient Details  Name: [name] [name] MRN: [id number] Date of Birth: Dec 01, 1940 Referring Provider:        CARDIAC REHAB PHASE II ORIENTATION from 11/25/2015 in [place]   Referring Provider  [name]      Encounter Date: 01/05/2016  Check In:     Session Check In - 01/05/16 0930    Check-In   Location [facility]   Staff Present Diane Angelina Pih, MS, EP, [facility], Exercise Physiologist;Rhondalyn Clingan Luther Parody, BS, EP, Exercise Physiologist;Christy Oletta Lamas, RN, BSN   Supervising physician immediately available to respond to emergencies See telemetry face sheet for immediately available MD   Medication changes reported     No   Fall or balance concerns reported    No   Warm-up and Cool-down Performed as group-led instruction   Resistance Training Performed Yes   VAD Patient? No   Pain Assessment   Currently in Pain? No/denies   Pain Score 0-No pain   Multiple Pain Sites No      Capillary Blood Glucose: No results found for this or any previous visit (from the past 24 hour(s)).   Goals Met:  Independence with exercise equipment Exercise tolerated well No report of cardiac concerns or symptoms Strength training completed today  Goals Unmet:  Not Applicable  Comments: Check out 1030   Dr. Kate Sable is Medical Director for [place] and Pulmonary Rehab.

## 2016-01-07 ENCOUNTER — Encounter (HOSPITAL_COMMUNITY)
Admission: RE | Admit: 2016-01-07 | Discharge: 2016-01-07 | Disposition: A | Discharge status: Home / Self Care | Payer: Medicare PPO | Source: Ambulatory Visit | Attending: Cardiothoracic Surgery | Admitting: Cardiothoracic Surgery

## 2016-01-07 DIAGNOSIS — Z951 Presence of aortocoronary bypass graft: Secondary | ICD-10-CM | POA: Diagnosis not present

## 2016-01-07 NOTE — Progress Notes (Signed)
Daily Session Note  Patient Details  Name: [name] [name] MRN: [id number] Date of Birth: Jul 19, 1941 Referring Provider:        CARDIAC REHAB PHASE II ORIENTATION from 11/25/2015 in [place]   Referring Provider  [name]      Encounter Date: 01/07/2016  Check In:     Session Check In - 01/07/16 0930    Check-In   Location [facility]   Staff Present Nickie Warwick Angelina Pih, MS, EP, [facility], Exercise Physiologist;Gregory Luther Parody, BS, EP, Exercise Physiologist;Christy Oletta Lamas, RN, BSN   Supervising physician immediately available to respond to emergencies See telemetry face sheet for immediately available MD   Medication changes reported     No   Fall or balance concerns reported    No   Warm-up and Cool-down Performed as group-led instruction   Resistance Training Performed Yes   VAD Patient? No   Pain Assessment   Currently in Pain? No/denies   Pain Score 0-No pain   Multiple Pain Sites No      Capillary Blood Glucose: No results found for this or any previous visit (from the past 24 hour(s)).   Goals Met:  Independence with exercise equipment Exercise tolerated well No report of cardiac concerns or symptoms Strength training completed today  Goals Unmet:  Not Applicable  Comments: Check out: 1030   Dr. Kate Sable is Medical Director for [place] and Pulmonary Rehab.

## 2016-01-09 ENCOUNTER — Encounter (HOSPITAL_COMMUNITY)
Admission: RE | Admit: 2016-01-09 | Discharge: 2016-01-09 | Disposition: A | Discharge status: Home / Self Care | Payer: Medicare PPO | Source: Ambulatory Visit | Attending: Cardiothoracic Surgery | Admitting: Cardiothoracic Surgery

## 2016-01-09 DIAGNOSIS — Z951 Presence of aortocoronary bypass graft: Secondary | ICD-10-CM | POA: Diagnosis not present

## 2016-01-09 NOTE — Progress Notes (Signed)
Daily Session Note  Patient Details  Name: RENELDA KILIAN MRN: [id number] Date of Birth: 1941/03/06 Referring Provider:        CARDIAC REHAB PHASE II ORIENTATION from 11/25/2015 in [place]   Referring Provider  [name]      Encounter Date: 01/09/2016  Check In:     Session Check In - 01/09/16 0930    Check-In   Location [facility]   Staff Present Diane Angelina Pih, MS, EP, [facility], Exercise Physiologist;Ishani Goldwasser Luther Parody, BS, EP, Exercise Physiologist;Christy Oletta Lamas, RN, BSN   Supervising physician immediately available to respond to emergencies See telemetry face sheet for immediately available MD   Medication changes reported     No   Fall or balance concerns reported    No   Warm-up and Cool-down Performed as group-led instruction   Resistance Training Performed Yes   VAD Patient? No   Pain Assessment   Currently in Pain? No/denies   Pain Score 0-No pain   Multiple Pain Sites No      Capillary Blood Glucose: No results found for this or any previous visit (from the past 24 hour(s)).   Goals Met:  Independence with exercise equipment Exercise tolerated well No report of cardiac concerns or symptoms Strength training completed today  Goals Unmet:  Not Applicable  Comments: Check out 1030   Dr. Kate Sable is Medical Director for [place] and Pulmonary Rehab.

## 2016-01-12 ENCOUNTER — Encounter (HOSPITAL_COMMUNITY)
Admission: RE | Admit: 2016-01-12 | Discharge: 2016-01-12 | Disposition: A | Discharge status: Home / Self Care | Payer: Medicare PPO | Source: Ambulatory Visit | Attending: Cardiothoracic Surgery | Admitting: Cardiothoracic Surgery

## 2016-01-12 DIAGNOSIS — Z951 Presence of aortocoronary bypass graft: Secondary | ICD-10-CM | POA: Diagnosis not present

## 2016-01-12 NOTE — Progress Notes (Signed)
Daily Session Note  Patient Details  Name: [name] [name] MRN: [id number] Date of Birth: Dec 02, 1940 Referring Provider:        CARDIAC REHAB PHASE II ORIENTATION from 11/25/2015 in [place]   Referring Provider  [name]      Encounter Date: 01/12/2016  Check In:     Session Check In - 01/12/16 0930    Check-In   Location [facility]   Staff Present Diane Angelina Pih, MS, EP, [facility], Exercise Physiologist;Lacey Wallman Luther Parody, BS, EP, Exercise Physiologist;Christy Oletta Lamas, RN, BSN   Supervising physician immediately available to respond to emergencies See telemetry face sheet for immediately available MD   Medication changes reported     No   Fall or balance concerns reported    No   Warm-up and Cool-down Performed as group-led instruction   Resistance Training Performed Yes   VAD Patient? No   Pain Assessment   Currently in Pain? No/denies   Pain Score 0-No pain   Multiple Pain Sites No      Capillary Blood Glucose: No results found for this or any previous visit (from the past 24 hour(s)).   Goals Met:  Independence with exercise equipment Exercise tolerated well No report of cardiac concerns or symptoms Strength training completed today  Goals Unmet:  Not Applicable  Comments: Check out 1030   Dr. Kate Sable is Medical Director for [place] and Pulmonary Rehab.

## 2016-01-14 ENCOUNTER — Encounter (HOSPITAL_COMMUNITY)
Admission: RE | Admit: 2016-01-14 | Discharge: 2016-01-14 | Disposition: A | Discharge status: Home / Self Care | Payer: Medicare PPO | Source: Ambulatory Visit | Attending: Cardiothoracic Surgery | Admitting: Cardiothoracic Surgery

## 2016-01-14 DIAGNOSIS — Z951 Presence of aortocoronary bypass graft: Secondary | ICD-10-CM | POA: Diagnosis not present

## 2016-01-14 NOTE — Progress Notes (Signed)
Daily Session Note  Patient Details  Name: [name] [name] MRN: [id number] Date of Birth: 12/13/1940 Referring Provider:        CARDIAC REHAB PHASE II ORIENTATION from 11/25/2015 in [place] [facility]   Referring Provider  Van Tright      Encounter Date: 01/14/2016  Check In:     Session Check In - 01/14/16 0930    Check-In   Location [facility]   Staff Present Diane Coad, MS, EP, [facility], Exercise Physiologist;Junetta Hearn, BS, EP, Exercise Physiologist   Supervising physician immediately available to respond to emergencies See telemetry face sheet for immediately available MD   Medication changes reported     No   Fall or balance concerns reported    No   Warm-up and Cool-down Performed as group-led instruction   Resistance Training Performed Yes   VAD Patient? No   Pain Assessment   Currently in Pain? No/denies   Pain Score 0-No pain   Multiple Pain Sites No      Capillary Blood Glucose: No results found for this or any previous visit (from the past 24 hour(s)).   Goals Met:  Independence with exercise equipment Exercise tolerated well No report of cardiac concerns or symptoms Strength training completed today  Goals Unmet:  Not Applicable  Comments: Check out 1030   Dr. Suresh Koneswaran is Medical Director for [place] [facility] and [facility]. 

## 2016-01-16 ENCOUNTER — Encounter (HOSPITAL_COMMUNITY)
Admission: RE | Admit: 2016-01-16 | Discharge: 2016-01-16 | Disposition: A | Discharge status: Home / Self Care | Payer: Medicare PPO | Source: Ambulatory Visit | Attending: Cardiothoracic Surgery | Admitting: Cardiothoracic Surgery

## 2016-01-16 DIAGNOSIS — Z951 Presence of aortocoronary bypass graft: Secondary | ICD-10-CM | POA: Diagnosis not present

## 2016-01-16 NOTE — Progress Notes (Signed)
Daily Session Note  Patient Details  Name: [name] [name] MRN: [id number] Date of Birth: [birth year] Referring Provider:        CARDIAC REHAB PHASE II ORIENTATION from 11/25/2015 in [place]   Referring Provider  [name]      Encounter Date: 01/16/2016  Check In:     Session Check In - 01/16/16 0930    Check-In   Location [facility]   Staff Present Diane Angelina Pih, MS, EP, [facility], Exercise Physiologist;Miyonna Ormiston Luther Parody, BS, EP, Exercise Physiologist   Supervising physician immediately available to respond to emergencies See telemetry face sheet for immediately available MD   Medication changes reported     No   Fall or balance concerns reported    No   Warm-up and Cool-down Performed as group-led instruction   Resistance Training Performed Yes   VAD Patient? No   Pain Assessment   Currently in Pain? No/denies   Pain Score 0-No pain   Multiple Pain Sites No      Capillary Blood Glucose: No results found for this or any previous visit (from the past 24 hour(s)).      Exercise Prescription Changes - 01/15/16 1300    Exercise Review   Progression Yes   Response to Exercise   Blood Pressure (Admit) 128/88 mmHg   Blood Pressure (Exercise) 144/80 mmHg   Blood Pressure (Exit) 130/80 mmHg   Heart Rate (Admit) 58 bpm   Heart Rate (Exercise) 84 bpm   Heart Rate (Exit) 67 bpm   Rating of Perceived Exertion (Exercise) 11   Symptoms No   Comments Had to switch to Arm Crank instead of TM.    Duration Progress to 30 minutes of continuous aerobic without signs/symptoms of physical distress   Intensity Rest + 30   Progression   Progression Continue to progress workloads to maintain intensity without signs/symptoms of physical distress.   Resistance Training   Training Prescription Yes   Weight 2   Reps 10-12   Interval Training   Interval Training No   Treadmill   MPH 0   Grade 0   Minutes 0   METs 0   NuStep   Level 3   Watts 16   Minutes 15   METs 1.9   Arm/Foot Ergometer   Level 3.4   Watts 18   Minutes 15   METs 2.8   Home Exercise Plan   Plans to continue exercise at Home   Frequency Add 2 additional days to program exercise sessions.     Goals Met:  Independence with exercise equipment Exercise tolerated well No report of cardiac concerns or symptoms Strength training completed today  Goals Unmet:  Not Applicable  Comments: Check out 1030   Dr. Kate Sable is Medical Director for [place] and Pulmonary Rehab.

## 2016-01-19 ENCOUNTER — Encounter (HOSPITAL_COMMUNITY)
Admission: RE | Admit: 2016-01-19 | Discharge: 2016-01-19 | Disposition: A | Discharge status: Home / Self Care | Payer: Medicare PPO | Source: Ambulatory Visit | Attending: Cardiothoracic Surgery | Admitting: Cardiothoracic Surgery

## 2016-01-19 DIAGNOSIS — Z951 Presence of aortocoronary bypass graft: Secondary | ICD-10-CM | POA: Diagnosis not present

## 2016-01-19 NOTE — Progress Notes (Signed)
Daily Session Note  Patient Details  Name: ZAKIYAH DIOP MRN: [id number] Date of Birth: 1941/07/16 Referring Provider:        CARDIAC REHAB PHASE II ORIENTATION from 11/25/2015 in [place]   Referring Provider  [name]      Encounter Date: 01/19/2016  Check In:     Session Check In - 01/19/16 0930    Check-In   Location [facility]   Staff Present Diane Angelina Pih, MS, EP, [facility], Exercise Physiologist;Kambree Krauss Luther Parody, BS, EP, Exercise Physiologist;Christy Oletta Lamas, RN, BSN   Supervising physician immediately available to respond to emergencies See telemetry face sheet for immediately available MD   Medication changes reported     No   Fall or balance concerns reported    No   Warm-up and Cool-down Performed as group-led instruction   Resistance Training Performed Yes   VAD Patient? No   Pain Assessment   Currently in Pain? No/denies   Pain Score 0-No pain   Multiple Pain Sites No      Capillary Blood Glucose: No results found for this or any previous visit (from the past 24 hour(s)).   Goals Met:  Independence with exercise equipment Exercise tolerated well No report of cardiac concerns or symptoms Strength training completed today  Goals Unmet:  Not Applicable  Comments: Check out 930   Dr. Kate Sable is Medical Director for [place] and Pulmonary Rehab.

## 2016-01-19 NOTE — Progress Notes (Signed)
Cardiac Individual Treatment Plan  Patient Details  Name: [name] [name] MRN: [id number] Date of Birth: 1940-10-11 Referring Provider:        CARDIAC REHAB PHASE II ORIENTATION from 11/25/2015 in [place]   Referring Provider  [name]      Initial Encounter Date:       CARDIAC REHAB PHASE II ORIENTATION from 11/25/2015 in [place]   Date  11/25/15   Referring Provider  [name]      Visit Diagnosis: No diagnosis found.  Patient's Home Medications on Admission:  Current outpatient prescriptions:  .  aspirin EC 325 MG EC tablet, Take 1 tablet (325 mg total) by mouth daily., Disp: , Rfl:  .  atorvastatin (LIPITOR) 80 MG tablet, Take 1 tablet (80 mg total) by mouth daily at 6 PM., Disp: 30 tablet, Rfl: 11 .  docusate sodium (DOCU SOFT) 100 MG capsule, Take 100 mg by mouth 2 (two) times daily., Disp: , Rfl:  .  losartan-hydrochlorothiazide (HYZAAR) 50-12.5 MG tablet, Take 1 tablet by mouth daily., Disp: , Rfl:  .  Metoprolol Tartrate 75 MG TABS, Take 75 mg by mouth 2 (two) times daily., Disp: , Rfl:  .  Multiple Vitamin (MULTIVITAMIN) tablet, Take 1 tablet by mouth daily., Disp: , Rfl:   Past Medical History: Past Medical History  Diagnosis Date  . Hypertension   . Anxiety about health 08/28/2015    Tobacco Use: History  Smoking status  . Never Smoker   Smokeless tobacco  . Not on file    Labs:     Recent Review Flowsheet Data    Labs for ITP Cardiac and Pulmonary Rehab Latest Ref Rng 08/15/2015 08/15/2015 08/16/2015 08/17/2015 09/07/2015   PHART 7.350 - 7.450 - - - - 7.461(H)   PCO2ART 35.0 - 45.0 mmHg - - - - 33.7(L)   HCO3 20.0 - 24.0 mEq/L - - - - 24.0   TCO2 0 - 100 mmol/L 24 - - - 25   O2SAT - - 69.7 72.0 65.0 94.0      Capillary Blood Glucose: Lab Results  Component Value Date   GLUCAP 99 09/14/2015   GLUCAP 120* 09/13/2015   GLUCAP 102* 09/13/2015   GLUCAP 133* 09/13/2015   GLUCAP 108* 09/13/2015      Exercise Target Goals:    Exercise Program Goal: Individual exercise prescription set with THRR, safety & activity barriers. Participant demonstrates ability to understand and report RPE using BORG scale, to self-measure pulse accurately, and to acknowledge the importance of the exercise prescription.  Exercise Prescription Goal: Starting with aerobic activity 30 plus minutes a day, 3 days per week for initial exercise prescription. Provide home exercise prescription and guidelines that participant acknowledges understanding prior to discharge.  Activity Barriers & Risk Stratification:     Activity Barriers & Cardiac Risk Stratification - 11/25/15 1629    Activity Barriers & Cardiac Risk Stratification   Activity Barriers Other (comment)  BP was extremely high   Comments BP was extremely elevated.    Cardiac Risk Stratification High      6 Minute Walk:   Initial Exercise Prescription:     Initial Exercise Prescription - 11/25/15 1600    Date of Initial Exercise RX and Referring Provider   Date 11/25/15   Referring Provider [name]   Treadmill   MPH 1.5   Grade 0   Minutes 15   METs 2.14   NuStep   Level 2   Watts  25   Minutes 15   METs 1.9   Prescription Details   Frequency (times per week) 3   Duration Progress to 30 minutes of continuous aerobic without signs/symptoms of physical distress   Intensity   THRR REST +  30   THRR 40-80% of Max Heartrate 101-116-130   Ratings of Perceived Exertion 11-15   Perceived Dyspnea 0-4   Progression   Progression Continue to progress workloads to maintain intensity without signs/symptoms of physical distress.   Resistance Training   Training Prescription Yes   Weight 1   Reps 10-12      Perform Capillary Blood Glucose checks as needed.  Exercise Prescription Changes:      Exercise Prescription Changes      12/21/15 1000 12/29/15 1500 01/07/16 1300 01/15/16 1300     Exercise Review   Progression Yes  Yes Yes Yes    Response to Exercise   Blood Pressure (Admit) 160/80 mmHg 154/80 mmHg 154/80 mmHg 128/88 mmHg    Blood Pressure (Exercise) 146/82 mmHg 148/80 mmHg 148/80 mmHg 144/80 mmHg    Blood Pressure (Exit) 162/78 mmHg 128/70 mmHg 128/70 mmHg 130/80 mmHg    Heart Rate (Admit) 65 bpm 74 bpm 74 bpm 58 bpm    Heart Rate (Exercise) 80 bpm 91 bpm 91 bpm 84 bpm    Heart Rate (Exit) 74 bpm 76 bpm 76 bpm 67 bpm    Rating of Perceived Exertion (Exercise) 11 11 11 11     Symptoms No No No No    Comments Had to switch to Arm Crank instead of TM.  Had to switch to Arm Crank instead of TM.  Had to switch to Arm Crank instead of TM.  Had to switch to Arm Crank instead of TM.     Duration Progress to 30 minutes of continuous aerobic without signs/symptoms of physical distress Progress to 30 minutes of continuous aerobic without signs/symptoms of physical distress Progress to 30 minutes of continuous aerobic without signs/symptoms of physical distress Progress to 30 minutes of continuous aerobic without signs/symptoms of physical distress    Intensity Rest + 30 Rest + 30 Rest + 30 Rest + 30    Progression   Progression Continue to progress workloads to maintain intensity without signs/symptoms of physical distress. Continue to progress workloads to maintain intensity without signs/symptoms of physical distress. Continue to progress workloads to maintain intensity without signs/symptoms of physical distress. Continue to progress workloads to maintain intensity without signs/symptoms of physical distress.    Resistance Training   Training Prescription Yes Yes Yes Yes    Weight 2 2 2 2     Reps 10-12 10-12 10-12 10-12    Interval Training   Interval Training No No No No    Treadmill   MPH 0 0 0 0    Grade 0 0 0 0    Minutes 0 0 0 0    METs 0 0 0 0    NuStep   Level 2 2 3 3     Watts 16 16 16 16     Minutes 15 15 15 15     METs 1.8 1.8 1.9 1.9    Arm/Foot Ergometer   Level 2.4 3.4 3.4 3.4    Watts 18 18  18 18     Minutes 15 15 15 15     METs 2.8 2.8 2.8 2.8    Home Exercise Plan   Plans to continue exercise at [place]    Frequency Add 2 additional  days to program exercise sessions. Add 2 additional days to program exercise sessions. Add 2 additional days to program exercise sessions. Add 2 additional days to program exercise sessions.       Exercise Comments:      Exercise Comments      12/21/15 1052           Exercise Comments Had to switch patient from doing Treadmill to the Arm Erogometer due to not being strong enough to walk the TM. We will go back to the TM once she has gained more strength and endurance. Patient is progressing appropriately.            Discharge Exercise Prescription (Final Exercise Prescription Changes):     Exercise Prescription Changes - 01/15/16 1300    Exercise Review   Progression Yes   Response to Exercise   Blood Pressure (Admit) 128/88 mmHg   Blood Pressure (Exercise) 144/80 mmHg   Blood Pressure (Exit) 130/80 mmHg   Heart Rate (Admit) 58 bpm   Heart Rate (Exercise) 84 bpm   Heart Rate (Exit) 67 bpm   Rating of Perceived Exertion (Exercise) 11   Symptoms No   Comments Had to switch to Arm Crank instead of TM.    Duration Progress to 30 minutes of continuous aerobic without signs/symptoms of physical distress   Intensity Rest + 30   Progression   Progression Continue to progress workloads to maintain intensity without signs/symptoms of physical distress.   Resistance Training   Training Prescription Yes   Weight 2   Reps 10-12   Interval Training   Interval Training No   Treadmill   MPH 0   Grade 0   Minutes 0   METs 0   NuStep   Level 3   Watts 16   Minutes 15   METs 1.9   Arm/Foot Ergometer   Level 3.4   Watts 18   Minutes 15   METs 2.8   Home Exercise Plan   Plans to continue exercise at Home   Frequency Add 2 additional days to program exercise sessions.      Nutrition:  Target Goals: Understanding  of nutrition guidelines, daily intake of sodium 1500mg , cholesterol 200mg , calories 30% from fat and 7% or less from saturated fats, daily to have 5 or more servings of fruits and vegetables.  Biometrics:     Pre Biometrics - 11/25/15 1646    Pre Biometrics   Waist Circumference 36 inches   Hip Circumference 37.5 inches   Waist to Hip Ratio 0.96 %   Triceps Skinfold 18 mm   Grip Strength 44 kg   Flexibility 12.8 in   Single Leg Stand 4 seconds       Nutrition Therapy Plan and Nutrition Goals:     Nutrition Therapy & Goals - 11/25/15 1645    Intervention Plan   Intervention Nutrition handout(s) given to patient.   Expected Outcomes Short Term Goal: Understand basic principles of dietary content, such as calories, fat, sodium, cholesterol and nutrients.;Long Term Goal: Adherence to prescribed nutrition plan.      Nutrition Discharge: Rate Your Plate Scores:     Nutrition Assessments - 11/25/15 1648    MEDFICTS Scores   Pre Score 6      Nutrition Goals Re-Evaluation:   Psychosocial: Target Goals: Acknowledge presence or absence of depression, maximize coping skills, provide positive support system. Participant is able to verbalize types and ability to use techniques and skills needed for reducing stress and  depression.  Initial Review & Psychosocial Screening:     Initial Psych Review & Screening - 11/25/15 [place]? Yes   Comments Concerned about BP being extremely elevated   Barriers   Psychosocial barriers to participate in program There are no identifiable barriers or psychosocial needs.   Screening Interventions   Interventions Encouraged to exercise      Quality of Life Scores:     Quality of Life - 11/25/15 1728    Quality of Life Scores   Health/Function Pre 22.13 %   Socioeconomic Pre 20.6 %   Psych/Spiritual Pre 23.36 %   Family Pre 25.13 %   GLOBAL Pre 22.59 %      PHQ-9:     Recent Review  Flowsheet Data    Depression screen [facility] 2/9 11/25/2015   Decreased Interest 0   Down, Depressed, Hopeless 0   PHQ - 2 Score 0   Altered sleeping 1   Tired, decreased energy 1   Change in appetite 0   Feeling bad or failure about yourself  0   Trouble concentrating 0   Moving slowly or fidgety/restless 0   Suicidal thoughts 0   PHQ-9 Score 2   Difficult doing work/chores Somewhat difficult      Psychosocial Evaluation and Intervention:   Psychosocial Re-Evaluation:     Psychosocial Re-Evaluation      12/15/15 1455 01/19/16 1156         Psychosocial Re-Evaluation   Interventions Encouraged to attend Cardiac Rehabilitation for the exercise Relaxation education;Encouraged to attend Cardiac Rehabilitation for the exercise;Stress management education      Continued Psychosocial Services Needed No No         Vocational Rehabilitation: Provide vocational rehab assistance to qualifying candidates.   Vocational Rehab Evaluation & Intervention:     Vocational Rehab - 11/25/15 1637    Initial Vocational Rehab Evaluation & Intervention   Assessment shows need for Vocational Rehabilitation No      Education: Education Goals: Education classes will be provided on a weekly basis, covering required topics. Participant will state understanding/return demonstration of topics presented.  Learning Barriers/Preferences:     Learning Barriers/Preferences - 11/25/15 1635    Learning Barriers/Preferences   Learning Barriers None   Learning Preferences Skilled Demonstration  Kinesthetic/Doing      Education Topics: Hypertension, Hypertension Reduction -Define heart disease and high blood pressure. Discus how high blood pressure affects the body and ways to reduce high blood pressure.   Exercise and Your Heart -Discuss why it is important to exercise, the FITT principles of exercise, normal and abnormal responses to exercise, and how to exercise safely.      CARDIAC REHAB PHASE  II EXERCISE from 01/14/2016 in [place]   Date  12/03/15   Educator  DC   Instruction Review Code  2- meets goals/outcomes      Angina -Discuss definition of angina, causes of angina, treatment of angina, and how to decrease risk of having angina.      CARDIAC REHAB PHASE II EXERCISE from 01/14/2016 in [place]   Date  12/10/15   Educator  -- [D coad]   Instruction Review Code  2- meets goals/outcomes      Cardiac Medications -Review what the following cardiac medications are used for, how they affect the body, and side effects that may occur when taking the medications.  Medications include Aspirin, Beta blockers, calcium channel  blockers, ACE Inhibitors, angiotensin receptor blockers, diuretics, digoxin, and antihyperlipidemics.      CARDIAC REHAB PHASE II EXERCISE from 01/14/2016 in [place]   Date  12/17/15   Educator  D. Coad   Instruction Review Code  2- meets goals/outcomes      Congestive Heart Failure -Discuss the definition of CHF, how to live with CHF, the signs and symptoms of CHF, and how keep track of weight and sodium intake.   Heart Disease and Intimacy -Discus the effect sexual activity has on the heart, how changes occur during intimacy as we age, and safety during sexual activity.      CARDIAC REHAB PHASE II EXERCISE from 01/14/2016 in [place]   Date  12/31/15   Educator  DC   Instruction Review Code  2- meets goals/outcomes      Smoking Cessation / COPD -Discuss different methods to quit smoking, the health benefits of quitting smoking, and the definition of COPD.      CARDIAC REHAB PHASE II EXERCISE from 01/14/2016 in [place]   Date  01/07/16   Educator  Russella Dar   Instruction Review Code  2- meets goals/outcomes      Nutrition I: Fats -Discuss the types of cholesterol, what cholesterol does to the heart, and how cholesterol  levels can be controlled.          CARDIAC REHAB PHASE II EXERCISE from 01/14/2016 in [place]   Date  01/14/16   Educator  Russella Dar   Instruction Review Code  2- meets goals/outcomes      Nutrition II: Labels -Discuss the different components of food labels and how to read food label   Heart Parts and Heart Disease -Discuss the anatomy of the heart, the pathway of blood circulation through the heart, and these are affected by heart disease.   Stress I: Signs and Symptoms -Discuss the causes of stress, how stress may lead to anxiety and depression, and ways to limit stress.   Stress II: Relaxation -Discuss different types of relaxation techniques to limit stress.   Warning Signs of Stroke / TIA -Discuss definition of a stroke, what the signs and symptoms are of a stroke, and how to identify when someone is having stroke.   Knowledge Questionnaire Score:     Knowledge Questionnaire Score - 11/25/15 1637    Knowledge Questionnaire Score   Pre Score 19/24      Core Components/Risk Factors/Patient Goals at Admission:     Personal Goals and Risk Factors at Admission - 11/25/15 1717    Core Components/Risk Factors/Patient Goals on Admission    Weight Management Weight Maintenance   Sedentary --  No   Increase Strength and Stamina Yes   Intervention Provide advice, education, support and counseling about physical activity/exercise needs.;Develop an individualized exercise prescription for aerobic and resistive training based on initial evaluation findings, risk stratification, comorbidities and participant's personal goals.   Expected Outcomes Achievement of increased cardiorespiratory fitness and enhanced flexibility, muscular endurance and strength shown through measurements of functional capacity and personal statement of participant.   Tobacco Cessation --  NO   Diabetes --  Patient says no. AIC is 6.1.    Heart Failure --  NO    Hypertension Yes   Intervention Provide education on lifestyle modifcations including regular physical activity/exercise, weight management, moderate sodium restriction and increased consumption of fresh fruit, vegetables, and low fat dairy, alcohol moderation, and smoking cessation.;Monitor prescription  use compliance.   Expected Outcomes Short Term: Continued assessment and intervention until BP is < 140/25mm HG in hypertensive participants. < 130/44mm HG in hypertensive participants with diabetes, heart failure or chronic kidney disease.;Long Term: Maintenance of blood pressure at goal levels.   Lipids --  WNL   Stress --  None   Personal Goal Other Yes   Personal Goal Gain strength and stamina, Get back to ADL's without elevated BP.   Intervention Check to see if taking the correct dose of medication for BP.   Expected Outcomes Become stronger and to see BP come down to within normal limits.       Core Components/Risk Factors/Patient Goals Review:      Goals and Risk Factor Review      12/15/15 1452 01/19/16 1153         Core Components/Risk Factors/Patient Goals Review   Personal Goals Review Weight Management/Obesity;Increase Strength and Stamina;Hypertension Weight Management/Obesity;Increase Strength and Stamina;Hypertension      Review lost 2 lbs. patient stated she feels stronger and can tell the exercise is working for her.  BP is still hypertensive but better than when first started. Systolic when started was 180s. Now is around 150s BP has been better. pt has lost 3 lbs in last 30 days.       Expected Outcomes Patient to get stronger, have controlled BP, lose weight Patient to get stronger, have controlled BP, lose weight         Core Components/Risk Factors/Patient Goals at Discharge (Final Review):      Goals and Risk Factor Review - 01/19/16 1153    Core Components/Risk Factors/Patient Goals Review   Personal Goals Review Weight Management/Obesity;Increase Strength  and Stamina;Hypertension   Review BP has been better. pt has lost 3 lbs in last 30 days.    Expected Outcomes Patient to get stronger, have controlled BP, lose weight      ITP Comments:     ITP Comments      11/25/15 1715           ITP Comments Unable to due patients walk test on orientation day due to BP being too elevated. Will do the walk test on the day she comes back to exercise. Will send ITP for initial signature without initial walk test results. Weill add results later date.           Comments:

## 2016-01-21 ENCOUNTER — Encounter (HOSPITAL_COMMUNITY)
Admission: RE | Admit: 2016-01-21 | Discharge: 2016-01-21 | Disposition: A | Discharge status: Home / Self Care | Payer: Medicare PPO | Source: Ambulatory Visit | Attending: Cardiothoracic Surgery | Admitting: Cardiothoracic Surgery

## 2016-01-21 DIAGNOSIS — Z951 Presence of aortocoronary bypass graft: Secondary | ICD-10-CM | POA: Diagnosis not present

## 2016-01-21 NOTE — Progress Notes (Signed)
Daily Session Note  Patient Details  Name: ELEANORE JUNIO MRN: [id number] Date of Birth: 1941-01-02 Referring Provider:        CARDIAC REHAB PHASE II ORIENTATION from 11/25/2015 in [place]   Referring Provider  [name]      Encounter Date: 01/21/2016  Check In:     Session Check In - 01/21/16 0930    Check-In   Location [facility]   Staff Present Diane Angelina Pih, MS, EP, [facility], Exercise Physiologist;Derico Mitton Luther Parody, BS, EP, Exercise Physiologist   Supervising physician immediately available to respond to emergencies See telemetry face sheet for immediately available MD   Medication changes reported     No   Fall or balance concerns reported    No   Warm-up and Cool-down Performed as group-led instruction   Resistance Training Performed Yes   VAD Patient? No   Pain Assessment   Currently in Pain? No/denies   Pain Score 0-No pain   Multiple Pain Sites No      Capillary Blood Glucose: No results found for this or any previous visit (from the past 24 hour(s)).   Goals Met:  Independence with exercise equipment Exercise tolerated well No report of cardiac concerns or symptoms Strength training completed today  Goals Unmet:  Not Applicable  Comments: Check out 1030   Dr. Kate Sable is Medical Director for [place] and Pulmonary Rehab.

## 2016-01-23 ENCOUNTER — Encounter (HOSPITAL_COMMUNITY)
Admission: RE | Admit: 2016-01-23 | Discharge: 2016-01-23 | Disposition: A | Discharge status: Home / Self Care | Payer: Medicare PPO | Source: Ambulatory Visit | Attending: Cardiothoracic Surgery | Admitting: Cardiothoracic Surgery

## 2016-01-23 DIAGNOSIS — Z951 Presence of aortocoronary bypass graft: Secondary | ICD-10-CM | POA: Diagnosis not present

## 2016-01-23 NOTE — Progress Notes (Signed)
Daily Session Note  Patient Details  Name: [name] [name] MRN: [id number] Date of Birth: 09/06/1940 Referring Provider:        CARDIAC REHAB PHASE II ORIENTATION from 11/25/2015 in [place] CARDIAC RE[facility]IL[facility]ATION   Referring Provider  Van Tright      Encounter Date: 01/23/2016  Check In:     Session Check In - 01/23/16 0930    Check-In   Location [facility]   Staff Present Diane Coad, MS, EP, [facility], Exercise Physiologist;Wallis Spizzirri, BS, EP, Exercise Physiologist;Debra Johnson, RN, BSN   Supervising physician immediately available to respond to emergencies See telemetry face sheet for immediately available MD   Medication changes reported     No   Fall or balance concerns reported    No   Warm-up and Cool-down Performed as group-led instruction   Resistance Training Performed Yes   VAD Patient? No   Pain Assessment   Currently in Pain? No/denies   Pain Score 0-No pain   Multiple Pain Sites No      Capillary Blood Glucose: No results found for this or any previous visit (from the past 24 hour(s)).   Goals Met:  Independence with exercise equipment Exercise tolerated well No report of cardiac concerns or symptoms Strength training completed today  Goals Unmet:  Not Applicable  Comments: Check out 1030   Dr. Suresh Koneswaran is Medical Director for [place] [facility] and Pulmonary Re[facility]. 

## 2016-01-26 ENCOUNTER — Encounter (HOSPITAL_COMMUNITY)
Admission: RE | Admit: 2016-01-26 | Discharge: 2016-01-26 | Disposition: A | Discharge status: Home / Self Care | Payer: Medicare PPO | Source: Ambulatory Visit | Attending: Cardiothoracic Surgery | Admitting: Cardiothoracic Surgery

## 2016-01-26 DIAGNOSIS — Z951 Presence of aortocoronary bypass graft: Secondary | ICD-10-CM | POA: Diagnosis present

## 2016-01-26 NOTE — Progress Notes (Signed)
Daily Session Note  Patient Details  Name: [name] [name] MRN: [id number] Date of Birth: 1940/11/20 Referring Provider:        CARDIAC REHAB PHASE II ORIENTATION from 11/25/2015 in [place]   Referring Provider  [name]      Encounter Date: 01/26/2016  Check In:     Session Check In - 01/26/16 0930    Check-In   Location [facility]   Staff Present Diane Angelina Pih, MS, EP, [facility], Exercise Physiologist;Gregory Luther Parody, BS, EP, Exercise Physiologist;Malu Pellegrini Wynetta Emery, RN, BSN   Supervising physician immediately available to respond to emergencies See telemetry face sheet for immediately available MD   Medication changes reported     No   Fall or balance concerns reported    No   Warm-up and Cool-down Performed as group-led instruction   Resistance Training Performed Yes   VAD Patient? No   Pain Assessment   Currently in Pain? No/denies   Pain Score 0-No pain   Multiple Pain Sites No      Capillary Blood Glucose: No results found for this or any previous visit (from the past 24 hour(s)).   Goals Met:  Independence with exercise equipment Exercise tolerated well No report of cardiac concerns or symptoms Strength training completed today  Goals Unmet:  Not Applicable  Comments: Check out 10:30.   Dr. Kate Sable is Medical Director for [facility] Cardiac and Pulmonary Rehab.

## 2016-01-28 ENCOUNTER — Encounter (HOSPITAL_COMMUNITY)
Admission: RE | Admit: 2016-01-28 | Discharge: 2016-01-28 | Disposition: A | Discharge status: Home / Self Care | Payer: Medicare PPO | Source: Ambulatory Visit | Attending: Cardiothoracic Surgery | Admitting: Cardiothoracic Surgery

## 2016-01-28 DIAGNOSIS — Z951 Presence of aortocoronary bypass graft: Secondary | ICD-10-CM | POA: Diagnosis not present

## 2016-01-28 NOTE — Progress Notes (Signed)
Daily Session Note  Patient Details  Name: [name] [name] MRN: [id number] Date of Birth: 1941-01-14 Referring Provider:        CARDIAC REHAB PHASE II ORIENTATION from 11/25/2015 in [place]   Referring Provider  [name]      Encounter Date: 01/28/2016  Check In:     Session Check In - 01/28/16 0930    Check-In   Location [facility]   Staff Present Diane Angelina Pih, MS, EP, [facility], Exercise Physiologist;Gregory Luther Parody, BS, EP, Exercise Physiologist;Katlin Bortner Wynetta Emery, RN, BSN   Supervising physician immediately available to respond to emergencies See telemetry face sheet for immediately available MD   Medication changes reported     No   Fall or balance concerns reported    No   Warm-up and Cool-down Performed as group-led instruction   Resistance Training Performed Yes   VAD Patient? No   Pain Assessment   Currently in Pain? No/denies   Pain Score 0-No pain   Multiple Pain Sites No      Capillary Blood Glucose: No results found for this or any previous visit (from the past 24 hour(s)).   Goals Met:  Independence with exercise equipment Exercise tolerated well No report of cardiac concerns or symptoms Strength training completed today  Goals Unmet:  Not Applicable  Comments: Check out 10:30.   Dr. Kate Sable is Medical Director for [facility] Cardiac and Pulmonary Rehab.

## 2016-01-30 ENCOUNTER — Encounter (HOSPITAL_COMMUNITY)
Admission: RE | Admit: 2016-01-30 | Discharge: 2016-01-30 | Disposition: A | Discharge status: Home / Self Care | Payer: Medicare PPO | Source: Ambulatory Visit | Attending: Cardiothoracic Surgery | Admitting: Cardiothoracic Surgery

## 2016-01-30 DIAGNOSIS — Z951 Presence of aortocoronary bypass graft: Secondary | ICD-10-CM | POA: Diagnosis not present

## 2016-01-30 NOTE — Progress Notes (Signed)
Daily Session Note  Patient Details  Name: [name] [name] MRN: [id number] Date of Birth: [birth year] Referring Provider:        CARDIAC REHAB PHASE II ORIENTATION from 11/25/2015 in [place]   Referring Provider  [name]      Encounter Date: 01/30/2016  Check In:     Session Check In - 01/30/16 0930    Check-In   Location [facility]   Staff Present Diane Angelina Pih, MS, EP, [facility], Exercise Physiologist;Commodore Bellew Luther Parody, BS, EP, Exercise Physiologist;Debra Wynetta Emery, RN, BSN   Supervising physician immediately available to respond to emergencies See telemetry face sheet for immediately available MD   Medication changes reported     No   Fall or balance concerns reported    No   Warm-up and Cool-down Performed as group-led instruction   Resistance Training Performed Yes   VAD Patient? No   Pain Assessment   Currently in Pain? No/denies   Pain Score 0-No pain   Multiple Pain Sites No      Capillary Blood Glucose: No results found for this or any previous visit (from the past 24 hour(s)).   Goals Met:  Independence with exercise equipment Exercise tolerated well No report of cardiac concerns or symptoms Strength training completed today  Goals Unmet:  Not Applicable  Comments: Check out 1030   Dr. Kate Sable is Medical Director for [place] and Pulmonary Rehab.

## 2016-02-02 ENCOUNTER — Encounter (HOSPITAL_COMMUNITY): Payer: Medicare PPO

## 2016-02-04 ENCOUNTER — Encounter (HOSPITAL_COMMUNITY): Payer: Medicare PPO

## 2016-02-06 ENCOUNTER — Encounter (HOSPITAL_COMMUNITY): Payer: Medicare PPO

## 2016-02-06 ENCOUNTER — Encounter (HOSPITAL_COMMUNITY)
Admission: RE | Admit: 2016-02-06 | Discharge: 2016-02-06 | Disposition: A | Discharge status: Home / Self Care | Payer: Medicare PPO | Source: Ambulatory Visit | Attending: Cardiothoracic Surgery | Admitting: Cardiothoracic Surgery

## 2016-02-06 DIAGNOSIS — Z951 Presence of aortocoronary bypass graft: Secondary | ICD-10-CM

## 2016-02-06 DIAGNOSIS — I2119 ST elevation (STEMI) myocardial infarction involving other coronary artery of inferior wall: Secondary | ICD-10-CM

## 2016-02-06 NOTE — Progress Notes (Signed)
Daily Session Note  Patient Details  Name: [name] [name] MRN: [id number] Date of Birth: [birth year] Referring Provider:        CARDIAC REHAB PHASE II ORIENTATION from 11/25/2015 in [place]   Referring Provider  [name]      Encounter Date: 02/06/2016  Check In:     Session Check In - 02/06/16 0930    Check-In   Location [facility]   Staff Present Diane Angelina Pih, MS, EP, [facility], Exercise Physiologist;Darrell Hauk Luther Parody, BS, EP, Exercise Physiologist;Debra Wynetta Emery, RN, BSN   Supervising physician immediately available to respond to emergencies See telemetry face sheet for immediately available MD   Medication changes reported     No   Fall or balance concerns reported    No   Warm-up and Cool-down Performed as group-led instruction   Resistance Training Performed Yes   VAD Patient? No   Pain Assessment   Currently in Pain? No/denies   Pain Score 0-No pain   Multiple Pain Sites No      Capillary Blood Glucose: No results found for this or any previous visit (from the past 24 hour(s)).   Goals Met:  Independence with exercise equipment Exercise tolerated well No report of cardiac concerns or symptoms Strength training completed today  Goals Unmet:  Not Applicable  Comments: Check out 1030   Dr. Kate Sable is Medical Director for [place] and Pulmonary Rehab.

## 2016-02-09 ENCOUNTER — Encounter (HOSPITAL_COMMUNITY)
Admission: RE | Admit: 2016-02-09 | Discharge: 2016-02-09 | Disposition: A | Discharge status: Home / Self Care | Payer: Medicare PPO | Source: Ambulatory Visit | Attending: Cardiothoracic Surgery | Admitting: Cardiothoracic Surgery

## 2016-02-09 DIAGNOSIS — I2119 ST elevation (STEMI) myocardial infarction involving other coronary artery of inferior wall: Secondary | ICD-10-CM

## 2016-02-09 DIAGNOSIS — Z951 Presence of aortocoronary bypass graft: Secondary | ICD-10-CM | POA: Diagnosis not present

## 2016-02-09 NOTE — Progress Notes (Signed)
Daily Session Note  Patient Details  Name: AMELIANNA MELLER MRN: [id number] Date of Birth: 1941/03/17 Referring Provider:        CARDIAC REHAB PHASE II ORIENTATION from 11/25/2015 in [place]   Referring Provider  [name]      Encounter Date: 02/09/2016  Check In:     Session Check In - 02/09/16 0930    Check-In   Location [facility]   Staff Present Diane Angelina Pih, MS, EP, [facility], Exercise Physiologist;Avanna Sowder Luther Parody, BS, EP, Exercise Physiologist;Debra Wynetta Emery, RN, BSN   Supervising physician immediately available to respond to emergencies See telemetry face sheet for immediately available MD   Medication changes reported     No   Fall or balance concerns reported    No   Warm-up and Cool-down Performed as group-led instruction   Resistance Training Performed Yes   VAD Patient? No   Pain Assessment   Currently in Pain? No/denies   Pain Score 0-No pain   Multiple Pain Sites No      Capillary Blood Glucose: No results found for this or any previous visit (from the past 24 hour(s)).   Goals Met:  Independence with exercise equipment Exercise tolerated well No report of cardiac concerns or symptoms Strength training completed today  Goals Unmet:  Not Applicable  Comments: Check out 1030   Dr. Kate Sable is Medical Director for [place] and Pulmonary Rehab.

## 2016-02-11 ENCOUNTER — Encounter (HOSPITAL_COMMUNITY)
Admission: RE | Admit: 2016-02-11 | Discharge: 2016-02-11 | Disposition: A | Discharge status: Home / Self Care | Payer: Medicare PPO | Source: Ambulatory Visit | Attending: Cardiothoracic Surgery | Admitting: Cardiothoracic Surgery

## 2016-02-11 DIAGNOSIS — I2119 ST elevation (STEMI) myocardial infarction involving other coronary artery of inferior wall: Secondary | ICD-10-CM

## 2016-02-11 DIAGNOSIS — Z951 Presence of aortocoronary bypass graft: Secondary | ICD-10-CM | POA: Diagnosis not present

## 2016-02-11 NOTE — Progress Notes (Signed)
Daily Session Note  Patient Details  Name: RISSA TURLEY MRN: [id number] Date of Birth: [birth year] Referring Provider:        CARDIAC REHAB PHASE II ORIENTATION from 11/25/2015 in [place]   Referring Provider  [name]      Encounter Date: 02/11/2016  Check In:     Session Check In - 02/11/16 0930    Check-In   Location [facility]   Staff Present Diane Angelina Pih, MS, EP, [facility], Exercise Physiologist;Reya Aurich Luther Parody, BS, EP, Exercise Physiologist;Debra Wynetta Emery, RN, BSN   Supervising physician immediately available to respond to emergencies See telemetry face sheet for immediately available MD   Medication changes reported     No   Fall or balance concerns reported    No   Warm-up and Cool-down Performed as group-led instruction   Resistance Training Performed Yes   VAD Patient? No   Pain Assessment   Currently in Pain? No/denies   Pain Score 0-No pain   Multiple Pain Sites No      Capillary Blood Glucose: No results found for this or any previous visit (from the past 24 hour(s)).   Goals Met:  Independence with exercise equipment Exercise tolerated well No report of cardiac concerns or symptoms Strength training completed today  Goals Unmet:  Not Applicable  Comments: Check out 1030   Dr. Kate Sable is Medical Director for [place] and Pulmonary Rehab.

## 2016-02-13 ENCOUNTER — Encounter (HOSPITAL_COMMUNITY)
Admission: RE | Admit: 2016-02-13 | Discharge: 2016-02-13 | Disposition: A | Discharge status: Home / Self Care | Payer: Medicare PPO | Source: Ambulatory Visit | Attending: Cardiothoracic Surgery | Admitting: Cardiothoracic Surgery

## 2016-02-13 DIAGNOSIS — Z951 Presence of aortocoronary bypass graft: Secondary | ICD-10-CM | POA: Diagnosis not present

## 2016-02-13 NOTE — Progress Notes (Signed)
Daily Session Note  Patient Details  Name: TYMESHIA AWAN MRN: [id number] Date of Birth: [birth year] Referring Provider:        CARDIAC REHAB PHASE II ORIENTATION from 11/25/2015 in [place]   Referring Provider  [name]      Encounter Date: 02/13/2016  Check In:     Session Check In - 02/13/16 0930    Check-In   Location [facility]   Staff Present Diane Angelina Pih, MS, EP, [facility], Exercise Physiologist;Gregory Luther Parody, BS, EP, Exercise Physiologist;Dreydon Cardenas Wynetta Emery, RN, BSN   Supervising physician immediately available to respond to emergencies See telemetry face sheet for immediately available MD   Medication changes reported     No   Fall or balance concerns reported    No   Warm-up and Cool-down Performed as group-led instruction   Resistance Training Performed Yes   VAD Patient? No   Pain Assessment   Currently in Pain? No/denies   Pain Score 0-No pain   Multiple Pain Sites No      Capillary Blood Glucose: No results found for this or any previous visit (from the past 24 hour(s)).   Goals Met:  Independence with exercise equipment Exercise tolerated well No report of cardiac concerns or symptoms Strength training completed today  Goals Unmet:  Not Applicable  Comments: Check out 1030.   Dr. Kate Sable is Medical Director for [facility] Cardiac and Pulmonary Rehab.

## 2016-02-16 ENCOUNTER — Encounter (HOSPITAL_COMMUNITY)
Admission: RE | Admit: 2016-02-16 | Discharge: 2016-02-16 | Disposition: A | Discharge status: Home / Self Care | Payer: Medicare PPO | Source: Ambulatory Visit | Attending: Cardiothoracic Surgery | Admitting: Cardiothoracic Surgery

## 2016-02-16 DIAGNOSIS — Z951 Presence of aortocoronary bypass graft: Secondary | ICD-10-CM

## 2016-02-16 DIAGNOSIS — I2119 ST elevation (STEMI) myocardial infarction involving other coronary artery of inferior wall: Secondary | ICD-10-CM

## 2016-02-16 NOTE — Progress Notes (Signed)
Cardiac Individual Treatment Plan  Patient Details  Name: [name] [name] MRN: [id number] Date of Birth: November 10, 1940 Referring Provider:   Flowsheet Row CARDIAC REHAB PHASE II ORIENTATION from 11/25/2015 in [facility]  Referring Provider  [name]      Initial Encounter Date:  [place] PHASE II ORIENTATION from 11/25/2015 in [place]  Date  11/25/15  Referring Provider  [name]      Visit Diagnosis: S/P CABG x 3  Myocardial infarction involving other coronary artery of inferior wall ([place])  Patient's Home Medications on Admission:  Current Outpatient Prescriptions:  .  aspirin EC 325 MG EC tablet, Take 1 tablet (325 mg total) by mouth daily., Disp: , Rfl:  .  atorvastatin (LIPITOR) 80 MG tablet, Take 1 tablet (80 mg total) by mouth daily at 6 PM., Disp: 30 tablet, Rfl: 11 .  docusate sodium (DOCU SOFT) 100 MG capsule, Take 100 mg by mouth 2 (two) times daily., Disp: , Rfl:  .  losartan-hydrochlorothiazide (HYZAAR) 50-12.5 MG tablet, Take 1 tablet by mouth daily., Disp: , Rfl:  .  Metoprolol Tartrate 75 MG TABS, Take 75 mg by mouth 2 (two) times daily., Disp: , Rfl:  .  Multiple Vitamin (MULTIVITAMIN) tablet, Take 1 tablet by mouth daily., Disp: , Rfl:   Past Medical History: Past Medical History:  Diagnosis Date  . Anxiety about health 08/28/2015  . Hypertension     Tobacco Use: History  Smoking Status  . Never Smoker  Smokeless Tobacco  . Not on file    Labs: Recent Review Flowsheet Data    Labs for ITP Cardiac and Pulmonary Rehab Latest Ref Rng & Units 08/15/2015 08/15/2015 08/16/2015 08/17/2015 09/07/2015   Cholestrol 0 - 200 mg/dL - - - - -   LDLCALC 0 - 99 mg/dL - - - - -   HDL >40 mg/dL - - - - -   Trlycerides <150 mg/dL - - - - -   Hemoglobin A1c 4.8 - 5.6 % - - - - -   PHART 7.350 - 7.450 - - - - 7.461(H)   PCO2ART 35.0 - 45.0 mmHg - - - - 33.7(L)   HCO3 20.0 - 24.0 mEq/L - - - - 24.0   TCO2 0 -  100 mmol/L 24 - - - 25   O2SAT % - 69.7 72.0 65.0 94.0      Capillary Blood Glucose: Lab Results  Component Value Date   GLUCAP 99 09/14/2015   GLUCAP 120 (H) 09/13/2015   GLUCAP 102 (H) 09/13/2015   GLUCAP 133 (H) 09/13/2015   GLUCAP 108 (H) 09/13/2015     Exercise Target Goals:    Exercise Program Goal: Individual exercise prescription set with THRR, safety & activity barriers. Participant demonstrates ability to understand and report RPE using BORG scale, to self-measure pulse accurately, and to acknowledge the importance of the exercise prescription.  Exercise Prescription Goal: Starting with aerobic activity 30 plus minutes a day, 3 days per week for initial exercise prescription. Provide home exercise prescription and guidelines that participant acknowledges understanding prior to discharge.  Activity Barriers & Risk Stratification:     Activity Barriers & Cardiac Risk Stratification - 11/25/15 1629      Activity Barriers & Cardiac Risk Stratification   Activity Barriers Other (comment)  BP was extremely high   Comments BP was extremely elevated.    Cardiac Risk Stratification High      6 Minute Walk:  Initial Exercise Prescription:     Initial Exercise Prescription - 11/25/15 1600      Date of Initial Exercise RX and Referring Provider   Date 11/25/15   Referring Provider [name]     Treadmill   MPH 1.5   Grade 0   Minutes 15   METs 2.14     NuStep   Level 2   Watts 25   Minutes 15   METs 1.9     Prescription Details   Frequency (times per week) 3   Duration Progress to 30 minutes of continuous aerobic without signs/symptoms of physical distress     Intensity   THRR REST +  30   THRR 40-80% of Max Heartrate 101-116-130   Ratings of Perceived Exertion 11-15   Perceived Dyspnea 0-4     Progression   Progression Continue to progress workloads to maintain intensity without signs/symptoms of physical distress.     Resistance Training    Training Prescription Yes   Weight 1   Reps 10-12      Perform Capillary Blood Glucose checks as needed.  Exercise Prescription Changes:      Exercise Prescription Changes    Row Name 12/21/15 1000 12/29/15 1500 01/07/16 1300 01/15/16 1300 02/16/16 1500     Exercise Review   Progression Yes Yes Yes Yes Yes     Response to Exercise   Blood Pressure (Admit) 160/80 154/80 154/80 128/88 140/84   Blood Pressure (Exercise) 146/82 148/80 148/80 144/80 154/78   Blood Pressure (Exit) 162/78 128/70 128/70 130/80 128/78   Heart Rate (Admit) 65 bpm 74 bpm 74 bpm 58 bpm 62 bpm   Heart Rate (Exercise) 80 bpm 91 bpm 91 bpm 84 bpm 112 bpm   Heart Rate (Exit) 74 bpm 76 bpm 76 bpm 67 bpm 71 bpm   Rating of Perceived Exertion (Exercise) 11 11 11 11 10    Symptoms No No No No No   Comments Had to switch to Arm Crank instead of TM.  Had to switch to Arm Crank instead of TM.  Had to switch to Arm Crank instead of TM.  Had to switch to Arm Crank instead of TM.   -   Duration Progress to 30 minutes of continuous aerobic without signs/symptoms of physical distress Progress to 30 minutes of continuous aerobic without signs/symptoms of physical distress Progress to 30 minutes of continuous aerobic without signs/symptoms of physical distress Progress to 30 minutes of continuous aerobic without signs/symptoms of physical distress Progress to 30 minutes of continuous aerobic without signs/symptoms of physical distress   Intensity Rest + 30 Rest + 30 Rest + 30 Rest + 30 Rest + 30     Progression   Progression Continue to progress workloads to maintain intensity without signs/symptoms of physical distress. Continue to progress workloads to maintain intensity without signs/symptoms of physical distress. Continue to progress workloads to maintain intensity without signs/symptoms of physical distress. Continue to progress workloads to maintain intensity without signs/symptoms of physical distress. Continue to progress  workloads to maintain intensity without signs/symptoms of physical distress.     Resistance Training   Training Prescription Yes Yes Yes Yes Yes   Weight 2 2 2 2  3.0   Reps 10-12 10-12 10-12 10-12 10-12     Interval Training   Interval Training No No No No No     Treadmill   MPH 0 0 0 0 0   Grade 0 0 0 0 0   Minutes 0 0  0 0 0   METs 0 0 0 0 0     NuStep   Level 2 2 3 3 2    Watts 16 16 16 16  32   Minutes 15 15 15 15 15    METs 1.8 1.8 1.9 1.9 3.46     Arm/Foot Ergometer   Level 2.4 3.4 3.4 3.4 2.8   Watts 18 18 18 18 21    Minutes 15 15 15 15 20    METs 2.8 2.8 2.8 2.8 2.3     Home Exercise Plan   Plans to continue exercise at [place]   Frequency Add 2 additional days to program exercise sessions. Add 2 additional days to program exercise sessions. Add 2 additional days to program exercise sessions. Add 2 additional days to program exercise sessions. Add 2 additional days to program exercise sessions.      Exercise Comments:      Exercise Comments    Row Name 12/21/15 1052 02/16/16 1513         Exercise Comments Had to switch patient from doing Treadmill to the Arm Erogometer due to not being strong enough to walk the TM. We will go back to the TM once she has gained more strength and endurance. Patient is progressing appropriately.  Patient is progressing well. Will continue to progress.           Discharge Exercise Prescription (Final Exercise Prescription Changes):     Exercise Prescription Changes - 02/16/16 1500      Exercise Review   Progression Yes     Response to Exercise   Blood Pressure (Admit) 140/84   Blood Pressure (Exercise) 154/78   Blood Pressure (Exit) 128/78   Heart Rate (Admit) 62 bpm   Heart Rate (Exercise) 112 bpm   Heart Rate (Exit) 71 bpm   Rating of Perceived Exertion (Exercise) 10   Symptoms No   Duration Progress to 30 minutes of continuous aerobic without signs/symptoms of physical distress   Intensity Rest + 30      Progression   Progression Continue to progress workloads to maintain intensity without signs/symptoms of physical distress.     Resistance Training   Training Prescription Yes   Weight 3.0   Reps 10-12     Interval Training   Interval Training No     Treadmill   MPH 0   Grade 0   Minutes 0   METs 0     NuStep   Level 2   Watts 32   Minutes 15   METs 3.46     Arm/Foot Ergometer   Level 2.8   Watts 21   Minutes 20   METs 2.3     Home Exercise Plan   Plans to continue exercise at Home   Frequency Add 2 additional days to program exercise sessions.      Nutrition:  Target Goals: Understanding of nutrition guidelines, daily intake of sodium 1500mg , cholesterol 200mg , calories 30% from fat and 7% or less from saturated fats, daily to have 5 or more servings of fruits and vegetables.  Biometrics:     Pre Biometrics - 11/25/15 1646      Pre Biometrics   Waist Circumference 36 inches   Hip Circumference 37.5 inches   Waist to Hip Ratio 0.96 %   Triceps Skinfold 18 mm   Grip Strength 44 kg   Flexibility 12.8 in   Single Leg Stand 4 seconds       Nutrition Therapy  Plan and Nutrition Goals:     Nutrition Therapy & Goals - 11/25/15 1645      Intervention Plan   Intervention Nutrition handout(s) given to patient.   Expected Outcomes Short Term Goal: Understand basic principles of dietary content, such as calories, fat, sodium, cholesterol and nutrients.;Long Term Goal: Adherence to prescribed nutrition plan.      Nutrition Discharge: Rate Your Plate Scores:     Nutrition Assessments - 11/25/15 1648      MEDFICTS Scores   Pre Score 6      Nutrition Goals Re-Evaluation:   Psychosocial: Target Goals: Acknowledge presence or absence of depression, maximize coping skills, provide positive support system. Participant is able to verbalize types and ability to use techniques and skills needed for reducing stress and depression.  Initial Review &  Psychosocial Screening:     Initial Psych Review & Screening - 11/25/15 [place]? Yes   Comments Concerned about BP being extremely elevated     Barriers   Psychosocial barriers to participate in program There are no identifiable barriers or psychosocial needs.     Screening Interventions   Interventions Encouraged to exercise      Quality of Life Scores:     Quality of Life - 11/25/15 1728      Quality of Life Scores   Health/Function Pre 22.13 %   Socioeconomic Pre 20.6 %   Psych/Spiritual Pre 23.36 %   Family Pre 25.13 %   GLOBAL Pre 22.59 %      PHQ-9: Recent Review Flowsheet Data    Depression screen [facility] 2/9 11/25/2015   Decreased Interest 0   Down, Depressed, Hopeless 0   PHQ - 2 Score 0   Altered sleeping 1   Tired, decreased energy 1   Change in appetite 0   Feeling bad or failure about yourself  0   Trouble concentrating 0   Moving slowly or fidgety/restless 0   Suicidal thoughts 0   PHQ-9 Score 2   Difficult doing work/chores Somewhat difficult      Psychosocial Evaluation and Intervention:   Psychosocial Re-Evaluation:     Psychosocial Re-Evaluation    Row Name 12/15/15 1455 01/19/16 1156 02/16/16 1524         Psychosocial Re-Evaluation   Interventions Encouraged to attend Cardiac Rehabilitation for the exercise Relaxation education;Encouraged to attend Cardiac Rehabilitation for the exercise;Stress management education Encouraged to attend Cardiac Rehabilitation for the exercise     Comments  -  - Patient has great overall score on PHQ-9 22.59.      Continued Psychosocial Services Needed No No No        Vocational Rehabilitation: Provide vocational rehab assistance to qualifying candidates.   Vocational Rehab Evaluation & Intervention:     Vocational Rehab - 11/25/15 1637      Initial Vocational Rehab Evaluation & Intervention   Assessment shows need for Vocational Rehabilitation No       Education: Education Goals: Education classes will be provided on a weekly basis, covering required topics. Participant will state understanding/return demonstration of topics presented.  Learning Barriers/Preferences:     Learning Barriers/Preferences - 11/25/15 1635      Learning Barriers/Preferences   Learning Barriers None   Learning Preferences Skilled Demonstration  Kinesthetic/Doing      Education Topics: Hypertension, Hypertension Reduction -Define heart disease and high blood pressure. Discus how high blood pressure affects the body and ways to reduce  high blood pressure.   Exercise and Your Heart -Discuss why it is important to exercise, the FITT principles of exercise, normal and abnormal responses to exercise, and how to exercise safely. Flowsheet Row CARDIAC REHAB PHASE II EXERCISE from 02/11/2016 in [facility]  Date  12/03/15  Educator  DC  Instruction Review Code  2- meets goals/outcomes      Angina -Discuss definition of angina, causes of angina, treatment of angina, and how to decrease risk of having angina. Flowsheet Row CARDIAC REHAB PHASE II EXERCISE from 02/11/2016 in [facility]  Date  12/10/15  Educator  -- [D Bearett Porcaro]  Instruction Review Code  2- meets goals/outcomes      Cardiac Medications -Review what the following cardiac medications are used for, how they affect the body, and side effects that may occur when taking the medications.  Medications include Aspirin, Beta blockers, calcium channel blockers, ACE Inhibitors, angiotensin receptor blockers, diuretics, digoxin, and antihyperlipidemics. Flowsheet Row CARDIAC REHAB PHASE II EXERCISE from 02/11/2016 in [place]  Date  12/17/15  Educator  D. Cynethia Schindler  Instruction Review Code  2- meets goals/outcomes      Congestive Heart Failure -Discuss the definition of CHF, how to live with CHF, the signs and symptoms of CHF, and how  keep track of weight and sodium intake.   Heart Disease and Intimacy -Discus the effect sexual activity has on the heart, how changes occur during intimacy as we age, and safety during sexual activity. Flowsheet Row CARDIAC REHAB PHASE II EXERCISE from 02/11/2016 in [facility]  Date  12/31/15  Educator  DC  Instruction Review Code  2- meets goals/outcomes      Smoking Cessation / COPD -Discuss different methods to quit smoking, the health benefits of quitting smoking, and the definition of COPD. Flowsheet Row CARDIAC REHAB PHASE II EXERCISE from 02/11/2016 in [facility]  Date  01/07/16  Educator  Russella Dar  Instruction Review Code  2- meets goals/outcomes      Nutrition I: Fats -Discuss the types of cholesterol, what cholesterol does to the heart, and how cholesterol levels can be controlled. Flowsheet Row CARDIAC REHAB PHASE II EXERCISE from 02/11/2016 in [facility]  Date  01/14/16  Educator  Russella Dar  Instruction Review Code  2- meets goals/outcomes      Nutrition II: Labels -Discuss the different components of food labels and how to read food label [place] from 02/11/2016 in [facility]  Date  01/21/16  Educator  Russella Dar  Instruction Review Code  2- meets goals/outcomes      Heart Parts and Heart Disease -Discuss the anatomy of the heart, the pathway of blood circulation through the heart, and these are affected by heart disease. Flowsheet Row CARDIAC REHAB PHASE II EXERCISE from 02/11/2016 in [place]  Date  01/28/16  Educator  DC  Instruction Review Code  2- meets goals/outcomes      Stress I: Signs and Symptoms -Discuss the causes of stress, how stress may lead to anxiety and depression, and ways to limit stress.   Stress II: Relaxation -Discuss different types of relaxation techniques to limit  stress. Flowsheet Row CARDIAC REHAB PHASE II EXERCISE from 02/11/2016 in [facility]  Date  02/11/16  Educator  DC/DJ  Instruction Review Code  2- meets goals/outcomes      Warning Signs of Stroke /  TIA -Discuss definition of a stroke, what the signs and symptoms are of a stroke, and how to identify when someone is having stroke.   Knowledge Questionnaire Score:     Knowledge Questionnaire Score - 11/25/15 1637      Knowledge Questionnaire Score   Pre Score 19/24      Core Components/Risk Factors/Patient Goals at Admission:     Personal Goals and Risk Factors at Admission - 11/25/15 1717      Core Components/Risk Factors/Patient Goals on Admission    Weight Management Weight Maintenance   Sedentary --  No   Increase Strength and Stamina Yes   Intervention Provide advice, education, support and counseling about physical activity/exercise needs.;Develop an individualized exercise prescription for aerobic and resistive training based on initial evaluation findings, risk stratification, comorbidities and participant's personal goals.   Expected Outcomes Achievement of increased cardiorespiratory fitness and enhanced flexibility, muscular endurance and strength shown through measurements of functional capacity and personal statement of participant.   Tobacco Cessation --  NO   Diabetes --  Patient says no. AIC is 6.1.    Heart Failure --  NO   Hypertension Yes   Intervention Provide education on lifestyle modifcations including regular physical activity/exercise, weight management, moderate sodium restriction and increased consumption of fresh fruit, vegetables, and low fat dairy, alcohol moderation, and smoking cessation.;Monitor prescription use compliance.   Expected Outcomes Short Term: Continued assessment and intervention until BP is < 140/62mm HG in hypertensive participants. < 130/76mm HG in hypertensive participants with diabetes, heart failure or  chronic kidney disease.;Long Term: Maintenance of blood pressure at goal levels.   Lipids --  WNL   Stress --  None   Personal Goal Other Yes   Personal Goal Gain strength and stamina, Get back to ADL's without elevated BP.   Intervention Check to see if taking the correct dose of medication for BP.   Expected Outcomes Become stronger and to see BP come down to within normal limits.       Core Components/Risk Factors/Patient Goals Review:      Goals and Risk Factor Review    Row Name 12/15/15 1452 01/19/16 1153 02/16/16 1515         Core Components/Risk Factors/Patient Goals Review   Personal Goals Review Weight Management/Obesity;Increase Strength and Stamina;Hypertension Weight Management/Obesity;Increase Strength and Stamina;Hypertension  -     Review lost 2 lbs. patient stated she feels stronger and can tell the exercise is working for her.  BP is still hypertensive but better than when first started. Systolic when started was 180s. Now is around 150s BP has been better. pt has lost 3 lbs in last 30 days.  Patient BP has reached more normal limits. Patient is gaining strength in her arms and legs. Patient is continuing to lose weight.      Expected Outcomes Patient to get stronger, have controlled BP, lose weight Patient to get stronger, have controlled BP, lose weight Patient will continue to get stronger, keep BP controlled.         Core Components/Risk Factors/Patient Goals at Discharge (Final Review):      Goals and Risk Factor Review - 02/16/16 1515      Core Components/Risk Factors/Patient Goals Review   Review Patient BP has reached more normal limits. Patient is gaining strength in her arms and legs. Patient is continuing to lose weight.    Expected Outcomes Patient will continue to get stronger, keep BP controlled.       ITP  Comments:     ITP Comments    Row Name 11/25/15 1715           ITP Comments Unable to due patients walk test on orientation day due to  BP being too elevated. Will do the walk test on the day she comes back to exercise. Will send ITP for initial signature without initial walk test results. Weill add results later date.           Comments: Patient is doing well with program. Will continue to monitor for progress.

## 2016-02-16 NOTE — Progress Notes (Signed)
Daily Session Note  Patient Details  Name: [name] [name] MRN: [id number] Date of Birth: 01-06-1941 Referring Provider:   Flowsheet Row CARDIAC REHAB PHASE II ORIENTATION from 11/25/2015 in [place]  Referring Provider  [name]      Encounter Date: 02/16/2016  Check In:     Session Check In - 02/16/16 0930      Check-In   Location [facility]   Staff Present Diane Angelina Pih, MS, EP, [facility], Exercise Physiologist;Audia Amick Luther Parody, BS, EP, Exercise Physiologist;Debra Wynetta Emery, RN, BSN   Supervising physician immediately available to respond to emergencies See telemetry face sheet for immediately available MD   Medication changes reported     No   Fall or balance concerns reported    No   Warm-up and Cool-down Performed as group-led instruction   Resistance Training Performed Yes   VAD Patient? No     Pain Assessment   Currently in Pain? No/denies   Pain Score 0-No pain   Multiple Pain Sites No      Capillary Blood Glucose: No results found for this or any previous visit (from the past 24 hour(s)).   Goals Met:  Independence with exercise equipment Exercise tolerated well No report of cardiac concerns or symptoms Strength training completed today  Goals Unmet:  Not Applicable  Comments: Check out 1030   Dr. Kate Sable is Medical Director for [place] and Pulmonary Rehab.

## 2016-02-17 LAB — AFB CULTURE, BLOOD

## 2016-02-18 ENCOUNTER — Encounter (HOSPITAL_COMMUNITY)
Admission: RE | Admit: 2016-02-18 | Discharge: 2016-02-18 | Disposition: A | Discharge status: Home / Self Care | Payer: Medicare PPO | Source: Ambulatory Visit | Attending: Cardiothoracic Surgery | Admitting: Cardiothoracic Surgery

## 2016-02-18 DIAGNOSIS — Z951 Presence of aortocoronary bypass graft: Secondary | ICD-10-CM

## 2016-02-18 DIAGNOSIS — I2119 ST elevation (STEMI) myocardial infarction involving other coronary artery of inferior wall: Secondary | ICD-10-CM

## 2016-02-18 NOTE — Progress Notes (Signed)
Daily Session Note  Patient Details  Name: FLORIS NEUHAUS MRN: [id number] Date of Birth: [birth year] Referring Provider:   Flowsheet Row CARDIAC REHAB PHASE II ORIENTATION from 11/25/2015 in [place]  Referring Provider  [name]      Encounter Date: 02/18/2016  Check In:     Session Check In - 02/18/16 1008      Check-In   Location [facility]   Staff Present Suzanne Boron, BS, EP, Exercise Physiologist;Raahil Ong Wynetta Emery, RN, BSN   Supervising physician immediately available to respond to emergencies See telemetry face sheet for immediately available MD   Medication changes reported     No   Fall or balance concerns reported    No   Warm-up and Cool-down Performed as group-led instruction   Resistance Training Performed Yes   VAD Patient? No     Pain Assessment   Currently in Pain? No/denies   Pain Score 0-No pain   Multiple Pain Sites No      Capillary Blood Glucose: No results found for this or any previous visit (from the past 24 hour(s)).   Goals Met:  Independence with exercise equipment Exercise tolerated well No report of cardiac concerns or symptoms Strength training completed today  Goals Unmet:  Not Applicable  Comments: Check out 1030.   Dr. Kate Sable is Medical Director for [facility] Cardiac and Pulmonary Rehab.

## 2016-02-20 ENCOUNTER — Encounter (HOSPITAL_COMMUNITY)
Admission: RE | Admit: 2016-02-20 | Discharge: 2016-02-20 | Disposition: A | Discharge status: Home / Self Care | Payer: Medicare PPO | Source: Ambulatory Visit | Attending: Cardiothoracic Surgery | Admitting: Cardiothoracic Surgery

## 2016-02-20 DIAGNOSIS — Z951 Presence of aortocoronary bypass graft: Secondary | ICD-10-CM

## 2016-02-20 DIAGNOSIS — I2119 ST elevation (STEMI) myocardial infarction involving other coronary artery of inferior wall: Secondary | ICD-10-CM

## 2016-02-20 NOTE — Progress Notes (Signed)
Daily Session Note  Patient Details  Name: [name] [name] MRN: [id number] Date of Birth: 1941/03/17 Referring Provider:   Flowsheet Row CARDIAC REHAB PHASE II ORIENTATION from 11/25/2015 in [place]  Referring Provider  [name]      Encounter Date: 02/20/2016  Check In:     Session Check In - 02/20/16 0930      Check-In   Location [facility]   Staff Present Diane Angelina Pih, MS, EP, [facility], Exercise Physiologist;Debra Wynetta Emery, RN, BSN;Rael Tilly, BS, EP, Exercise Physiologist   Supervising physician immediately available to respond to emergencies See telemetry face sheet for immediately available MD   Medication changes reported     No   Fall or balance concerns reported    No   Warm-up and Cool-down Performed as group-led instruction   Resistance Training Performed Yes   VAD Patient? No     Pain Assessment   Currently in Pain? No/denies   Pain Score 0-No pain   Multiple Pain Sites No      Capillary Blood Glucose: No results found for this or any previous visit (from the past 24 hour(s)).   Goals Met:  Independence with exercise equipment Exercise tolerated well No report of cardiac concerns or symptoms Strength training completed today  Goals Unmet:  Not Applicable  Comments: Check out 1030   Dr. Kate Sable is Medical Director for [facility].

## 2016-02-23 ENCOUNTER — Encounter (HOSPITAL_COMMUNITY)
Admission: RE | Admit: 2016-02-23 | Discharge: 2016-02-23 | Disposition: A | Discharge status: Home / Self Care | Payer: Medicare PPO | Source: Ambulatory Visit | Attending: Cardiothoracic Surgery | Admitting: Cardiothoracic Surgery

## 2016-02-23 DIAGNOSIS — I2119 ST elevation (STEMI) myocardial infarction involving other coronary artery of inferior wall: Secondary | ICD-10-CM

## 2016-02-23 DIAGNOSIS — Z951 Presence of aortocoronary bypass graft: Secondary | ICD-10-CM

## 2016-02-23 NOTE — Progress Notes (Signed)
Daily Session Note  Patient Details  Name: [name] [name] MRN: [id number] Date of Birth: 06-29-1941 Referring Provider:   Flowsheet Row CARDIAC REHAB PHASE II ORIENTATION from 11/25/2015 in [place]  Referring Provider  [name]      Encounter Date: 02/23/2016  Check In:     Session Check In - 02/23/16 0930      Check-In   Location [facility]   Staff Present Diane Angelina Pih, MS, EP, [facility], Exercise Physiologist;Debra Wynetta Emery, RN, BSN;Baili Stang, BS, EP, Exercise Physiologist   Supervising physician immediately available to respond to emergencies See telemetry face sheet for immediately available MD   Medication changes reported     No   Fall or balance concerns reported    No   Warm-up and Cool-down Performed as group-led instruction   Resistance Training Performed Yes   VAD Patient? No     Pain Assessment   Currently in Pain? No/denies   Pain Score 0-No pain   Multiple Pain Sites No      Capillary Blood Glucose: No results found for this or any previous visit (from the past 24 hour(s)).   Goals Met:  Independence with exercise equipment Exercise tolerated well No report of cardiac concerns or symptoms Strength training completed today  Goals Unmet:  Not Applicable  Comments: Check out 1030   Dr. Kate Sable is Medical Director for [place].

## 2016-02-25 ENCOUNTER — Encounter (HOSPITAL_COMMUNITY)
Admission: RE | Admit: 2016-02-25 | Discharge: 2016-02-25 | Disposition: A | Discharge status: Home / Self Care | Payer: Medicare PPO | Source: Ambulatory Visit | Attending: Cardiothoracic Surgery | Admitting: Cardiothoracic Surgery

## 2016-02-25 DIAGNOSIS — I25810 Atherosclerosis of coronary artery bypass graft(s) without angina pectoris: Secondary | ICD-10-CM | POA: Insufficient documentation

## 2016-02-25 DIAGNOSIS — Z951 Presence of aortocoronary bypass graft: Secondary | ICD-10-CM | POA: Diagnosis present

## 2016-02-25 NOTE — Progress Notes (Signed)
Daily Session Note  Patient Details  Name: [name] [name] MRN: [id number] Date of Birth: 03-27-1941 Referring Provider:   Flowsheet Row CARDIAC REHAB PHASE II ORIENTATION from 11/25/2015 in [place]  Referring Provider  [name]      Encounter Date: 02/25/2016  Check In:     Session Check In - 02/25/16 0930      Check-In   Location [facility]   Staff Present Diane Angelina Pih, MS, EP, [facility], Exercise Physiologist;Gregory Luther Parody, BS, EP, Exercise Physiologist;Wende Longstreth Wynetta Emery, RN, BSN   Supervising physician immediately available to respond to emergencies See telemetry face sheet for immediately available MD   Medication changes reported     No   Fall or balance concerns reported    No   Warm-up and Cool-down Performed as group-led instruction   Resistance Training Performed Yes   VAD Patient? No     Pain Assessment   Currently in Pain? No/denies   Pain Score 0-No pain   Multiple Pain Sites No      Capillary Blood Glucose: No results found for this or any previous visit (from the past 24 hour(s)).   Goals Met:  Independence with exercise equipment Exercise tolerated well No report of cardiac concerns or symptoms Strength training completed today  Goals Unmet:  Not Applicable  Comments: Check out 1030.   Dr. Kate Sable is Medical Director for [facility] Cardiac and Pulmonary Rehab.

## 2016-02-27 ENCOUNTER — Encounter (HOSPITAL_COMMUNITY)
Admission: RE | Admit: 2016-02-27 | Discharge: 2016-02-27 | Disposition: A | Discharge status: Home / Self Care | Payer: Medicare PPO | Source: Ambulatory Visit | Attending: Cardiothoracic Surgery | Admitting: Cardiothoracic Surgery

## 2016-02-27 DIAGNOSIS — Z951 Presence of aortocoronary bypass graft: Secondary | ICD-10-CM

## 2016-02-27 DIAGNOSIS — I25810 Atherosclerosis of coronary artery bypass graft(s) without angina pectoris: Secondary | ICD-10-CM | POA: Diagnosis not present

## 2016-02-27 NOTE — Progress Notes (Signed)
Daily Session Note  Patient Details  Name: GABRIELLAH RABEL MRN: [id number] Date of Birth: [birth year] Referring Provider:   Flowsheet Row CARDIAC REHAB PHASE II ORIENTATION from 11/25/2015 in [place]  Referring Provider  [name]      Encounter Date: 02/27/2016  Check In:     Session Check In - 02/27/16 0930      Check-In   Location [facility]   Staff Present Diane Angelina Pih, MS, EP, [facility], Exercise Physiologist;Rian Busche Wynetta Emery, RN, BSN   Supervising physician immediately available to respond to emergencies See telemetry face sheet for immediately available MD   Medication changes reported     No   Fall or balance concerns reported    No   Warm-up and Cool-down Performed as group-led instruction   Resistance Training Performed Yes   VAD Patient? No     Pain Assessment   Currently in Pain? No/denies   Pain Score 0-No pain   Multiple Pain Sites No      Capillary Blood Glucose: No results found for this or any previous visit (from the past 24 hour(s)).   Goals Met:  Independence with exercise equipment Exercise tolerated well No report of cardiac concerns or symptoms Strength training completed today  Goals Unmet:  Not Applicable  Comments: check out 1030   Dr. Kate Sable is Medical Director for [place] and Pulmonary Rehab.

## 2016-03-01 ENCOUNTER — Encounter (HOSPITAL_COMMUNITY)
Admission: RE | Admit: 2016-03-01 | Discharge: 2016-03-01 | Disposition: A | Discharge status: Home / Self Care | Payer: Medicare PPO | Source: Ambulatory Visit | Attending: Cardiothoracic Surgery | Admitting: Cardiothoracic Surgery

## 2016-03-01 VITALS — Ht 70.0 in | Wt 157.2 lb

## 2016-03-01 DIAGNOSIS — I2119 ST elevation (STEMI) myocardial infarction involving other coronary artery of inferior wall: Secondary | ICD-10-CM

## 2016-03-01 DIAGNOSIS — Z951 Presence of aortocoronary bypass graft: Secondary | ICD-10-CM

## 2016-03-01 DIAGNOSIS — I25810 Atherosclerosis of coronary artery bypass graft(s) without angina pectoris: Secondary | ICD-10-CM | POA: Diagnosis not present

## 2016-03-01 NOTE — Progress Notes (Signed)
Daily Session Note  Patient Details  Name: [name] [name] MRN: [id number] Date of Birth: 04/16/1941 Referring Provider:   Flowsheet Row CARDIAC REHAB PHASE II ORIENTATION from 11/25/2015 in [place]  Referring Provider  [name]      Encounter Date: 03/01/2016  Check In:     Session Check In - 03/01/16 0930      Check-In   Location [facility]   Staff Present Diane Angelina Pih, MS, EP, [facility], Exercise Physiologist;Debra Wynetta Emery, RN, BSN;Tavion Senkbeil, BS, EP, Exercise Physiologist   Supervising physician immediately available to respond to emergencies See telemetry face sheet for immediately available MD   Medication changes reported     No   Fall or balance concerns reported    No   Warm-up and Cool-down Performed as group-led instruction   Resistance Training Performed Yes   VAD Patient? No     Pain Assessment   Currently in Pain? No/denies   Pain Score 0-No pain   Multiple Pain Sites No      Capillary Blood Glucose: No results found for this or any previous visit (from the past 24 hour(s)).   Goals Met:  Independence with exercise equipment Exercise tolerated well No report of cardiac concerns or symptoms Strength training completed today  Goals Unmet:  Not Applicable  Comments: Check out 1030   Dr. Kate Sable is Medical Director for [place] and Pulmonary Rehab.

## 2016-03-03 ENCOUNTER — Encounter (HOSPITAL_COMMUNITY): Payer: Medicare PPO

## 2016-03-11 NOTE — Progress Notes (Signed)
Cardiac Individual Treatment Plan  Patient Details  Name: [name] [name] MRN: [id number] Date of Birth: 03-10-1941 Referring Provider:   Flowsheet Row CARDIAC REHAB PHASE II ORIENTATION from 11/25/2015 in [place]  Referring Provider  [name]      Initial Encounter Date:  [place] PHASE II ORIENTATION from 11/25/2015 in [place]  Date  11/25/15  Referring Provider  [name]      Visit Diagnosis: S/P CABG x 3  Myocardial infarction involving other coronary artery of inferior wall ([place])  Patient's Home Medications on Admission:  Current Outpatient Prescriptions:  .  aspirin EC 325 MG EC tablet, Take 1 tablet (325 mg total) by mouth daily., Disp: , Rfl:  .  atorvastatin (LIPITOR) 80 MG tablet, Take 1 tablet (80 mg total) by mouth daily at 6 PM., Disp: 30 tablet, Rfl: 11 .  docusate sodium (DOCU SOFT) 100 MG capsule, Take 100 mg by mouth 2 (two) times daily., Disp: , Rfl:  .  losartan-hydrochlorothiazide (HYZAAR) 50-12.5 MG tablet, Take 1 tablet by mouth daily., Disp: , Rfl:  .  Metoprolol Tartrate 75 MG TABS, Take 75 mg by mouth 2 (two) times daily., Disp: , Rfl:  .  Multiple Vitamin (MULTIVITAMIN) tablet, Take 1 tablet by mouth daily., Disp: , Rfl:   Past Medical History: Past Medical History:  Diagnosis Date  . Anxiety about health 08/28/2015  . Hypertension     Tobacco Use: History  Smoking Status  . Never Smoker  Smokeless Tobacco  . Not on file    Labs: Recent Review Flowsheet Data    Labs for ITP Cardiac and Pulmonary Rehab Latest Ref Rng & Units 08/15/2015 08/15/2015 08/16/2015 08/17/2015 09/07/2015   Cholestrol 0 - 200 mg/dL - - - - -   LDLCALC 0 - 99 mg/dL - - - - -   HDL >40 mg/dL - - - - -   Trlycerides <150 mg/dL - - - - -   Hemoglobin A1c 4.8 - 5.6 % - - - - -   PHART 7.350 - 7.450 - - - - 7.461(H)   PCO2ART 35.0 - 45.0 mmHg - - - - 33.7(L)   HCO3 20.0 - 24.0 mEq/L - - - - 24.0   TCO2 0 -  100 mmol/L 24 - - - 25   O2SAT % - 69.7 72.0 65.0 94.0      Capillary Blood Glucose: Lab Results  Component Value Date   GLUCAP 99 09/14/2015   GLUCAP 120 (H) 09/13/2015   GLUCAP 102 (H) 09/13/2015   GLUCAP 133 (H) 09/13/2015   GLUCAP 108 (H) 09/13/2015     Exercise Target Goals:    Exercise Program Goal: Individual exercise prescription set with THRR, safety & activity barriers. Participant demonstrates ability to understand and report RPE using BORG scale, to self-measure pulse accurately, and to acknowledge the importance of the exercise prescription.  Exercise Prescription Goal: Starting with aerobic activity 30 plus minutes a day, 3 days per week for initial exercise prescription. Provide home exercise prescription and guidelines that participant acknowledges understanding prior to discharge.  Activity Barriers & Risk Stratification:     Activity Barriers & Cardiac Risk Stratification - 11/25/15 1629      Activity Barriers & Cardiac Risk Stratification   Activity Barriers Other (comment)  BP was extremely high   Comments BP was extremely elevated.    Cardiac Risk Stratification High      6 Minute Walk:  [place] Name 03/01/16 1436         6 Minute Walk   Phase Discharge     Distance 1600 feet     Distance % Change 33.33 %     Walk Time 6 minutes     # of Rest Breaks 0     MPH 3.03     METS 3.32     RPE 10     Perceived Dyspnea  9     VO2 Peak 11.4     Symptoms No     Resting HR 62 bpm     Resting BP 112/60     Max Ex. HR 99 bpm     Max Ex. BP 134/70     2 Minute Post BP 124/66        Initial Exercise Prescription:     Initial Exercise Prescription - 11/25/15 1600      Date of Initial Exercise RX and Referring Provider   Date 11/25/15   Referring Provider [name]     Treadmill   MPH 1.5   Grade 0   Minutes 15   METs 2.14     NuStep   Level 2   Watts 25   Minutes 15   METs 1.9     Prescription Details    Frequency (times per week) 3   Duration Progress to 30 minutes of continuous aerobic without signs/symptoms of physical distress     Intensity   THRR REST +  30   THRR 40-80% of Max Heartrate 101-116-130   Ratings of Perceived Exertion 11-15   Perceived Dyspnea 0-4     Progression   Progression Continue to progress workloads to maintain intensity without signs/symptoms of physical distress.     Resistance Training   Training Prescription Yes   Weight 1   Reps 10-12      Perform Capillary Blood Glucose checks as needed.  Exercise Prescription Changes:      Exercise Prescription Changes    Row Name 12/21/15 1000 12/29/15 1500 01/07/16 1300 01/15/16 1300 02/16/16 1500     Exercise Review   Progression Yes Yes Yes Yes Yes     Response to Exercise   Blood Pressure (Admit) 160/80 154/80 154/80 128/88 140/84   Blood Pressure (Exercise) 146/82 148/80 148/80 144/80 154/78   Blood Pressure (Exit) 162/78 128/70 128/70 130/80 128/78   Heart Rate (Admit) 65 bpm 74 bpm 74 bpm 58 bpm 62 bpm   Heart Rate (Exercise) 80 bpm 91 bpm 91 bpm 84 bpm 112 bpm   Heart Rate (Exit) 74 bpm 76 bpm 76 bpm 67 bpm 71 bpm   Rating of Perceived Exertion (Exercise) 11 11 11 11 10    Symptoms No No No No No   Comments Had to switch to Arm Crank instead of TM.  Had to switch to Arm Crank instead of TM.  Had to switch to Arm Crank instead of TM.  Had to switch to Arm Crank instead of TM.   -   Duration Progress to 30 minutes of continuous aerobic without signs/symptoms of physical distress Progress to 30 minutes of continuous aerobic without signs/symptoms of physical distress Progress to 30 minutes of continuous aerobic without signs/symptoms of physical distress Progress to 30 minutes of continuous aerobic without signs/symptoms of physical distress Progress to 30 minutes of continuous aerobic without signs/symptoms of physical distress   Intensity Rest + 30 Rest + 30 Rest + 30 Rest +  30 Rest + 30      Progression   Progression Continue to progress workloads to maintain intensity without signs/symptoms of physical distress. Continue to progress workloads to maintain intensity without signs/symptoms of physical distress. Continue to progress workloads to maintain intensity without signs/symptoms of physical distress. Continue to progress workloads to maintain intensity without signs/symptoms of physical distress. Continue to progress workloads to maintain intensity without signs/symptoms of physical distress.     Resistance Training   Training Prescription Yes Yes Yes Yes Yes   Weight 2 2 2 2  3.0   Reps 10-12 10-12 10-12 10-12 10-12     Interval Training   Interval Training No No No No No     Treadmill   MPH 0 0 0 0 0   Grade 0 0 0 0 0   Minutes 0 0 0 0 0   METs 0 0 0 0 0     NuStep   Level 2 2 3 3 2    Watts 16 16 16 16  32   Minutes 15 15 15 15 15    METs 1.8 1.8 1.9 1.9 3.46     Arm/Foot Ergometer   Level 2.4 3.4 3.4 3.4 2.8   Watts 18 18 18 18 21    Minutes 15 15 15 15 20    METs 2.8 2.8 2.8 2.8 2.3     Home Exercise Plan   Plans to continue exercise at [place]   Frequency Add 2 additional days to program exercise sessions. Add 2 additional days to program exercise sessions. Add 2 additional days to program exercise sessions. Add 2 additional days to program exercise sessions. Add 2 additional days to program exercise sessions.   [place] Name 03/01/16 1400             Exercise Review   Progression Yes         Response to Exercise   Blood Pressure (Admit) 126/74       Blood Pressure (Exercise) 156/92       Blood Pressure (Exit) 122/90       Heart Rate (Admit) 61 bpm       Heart Rate (Exercise) 82 bpm       Heart Rate (Exit) 61 bpm       Rating of Perceived Exertion (Exercise) 11       Symptoms No       Duration Progress to 30 minutes of continuous aerobic without signs/symptoms of physical distress       Intensity Rest + 30         Progression    Progression Continue to progress workloads to maintain intensity without signs/symptoms of physical distress.         Resistance Training   Training Prescription Yes       Weight 3.0       Reps 10-12         Interval Training   Interval Training No         Treadmill   MPH 0       Grade 0       Minutes 0       METs 0         NuStep   Level 3       Watts 33       Minutes 15       METs 3.47         Arm/Foot Ergometer   Level 3.1  Watts 21       Minutes 20       METs 2.7         Home Exercise Plan   Plans to continue exercise at Home       Frequency Add 2 additional days to program exercise sessions.          Exercise Comments:      Exercise Comments    Row Name 12/21/15 1052 02/16/16 1513 03/02/16 1324       Exercise Comments Had to switch patient from doing Treadmill to the Arm Erogometer due to not being strong enough to walk the TM. We will go back to the TM once she has gained more strength and endurance. Patient is progressing appropriately.  Patient is progressing well. Will continue to progress.  Patient is progressing appropriately and has completed the program          Discharge Exercise Prescription (Final Exercise Prescription Changes):     Exercise Prescription Changes - 03/01/16 1400      Exercise Review   Progression Yes     Response to Exercise   Blood Pressure (Admit) 126/74   Blood Pressure (Exercise) 156/92   Blood Pressure (Exit) 122/90   Heart Rate (Admit) 61 bpm   Heart Rate (Exercise) 82 bpm   Heart Rate (Exit) 61 bpm   Rating of Perceived Exertion (Exercise) 11   Symptoms No   Duration Progress to 30 minutes of continuous aerobic without signs/symptoms of physical distress   Intensity Rest + 30     Progression   Progression Continue to progress workloads to maintain intensity without signs/symptoms of physical distress.     Resistance Training   Training Prescription Yes   Weight 3.0   Reps 10-12     Interval Training    Interval Training No     Treadmill   MPH 0   Grade 0   Minutes 0   METs 0     NuStep   Level 3   Watts 33   Minutes 15   METs 3.47     Arm/Foot Ergometer   Level 3.1   Watts 21   Minutes 20   METs 2.7     Home Exercise Plan   Plans to continue exercise at Home   Frequency Add 2 additional days to program exercise sessions.      Nutrition:  Target Goals: Understanding of nutrition guidelines, daily intake of sodium 1500mg , cholesterol 200mg , calories 30% from fat and 7% or less from saturated fats, daily to have 5 or more servings of fruits and vegetables.  Biometrics:     Pre Biometrics - 11/25/15 1646      Pre Biometrics   Waist Circumference 36 inches   Hip Circumference 37.5 inches   Waist to Hip Ratio 0.96 %   Triceps Skinfold 18 mm   Grip Strength 44 kg   Flexibility 12.8 in   Single Leg Stand 4 seconds         Post Biometrics - 03/01/16 1438       Post  Biometrics   Height 5\' 10"  (1.778 m)   Weight 157 lb 3 oz (71.3 kg)   Waist Circumference 32 inches   Hip Circumference 37.5 inches   Waist to Hip Ratio 0.85 %   BMI (Calculated) 22.6   Triceps Skinfold 16 mm   % Body Fat 32.2 %   Grip Strength 49 kg   Flexibility 15.3 in   Single Leg  Stand 6 seconds      Nutrition Therapy Plan and Nutrition Goals:     Nutrition Therapy & Goals - 11/25/15 1645      Intervention Plan   Intervention Nutrition handout(s) given to patient.   Expected Outcomes Short Term Goal: Understand basic principles of dietary content, such as calories, fat, sodium, cholesterol and nutrients.;Long Term Goal: Adherence to prescribed nutrition plan.      Nutrition Discharge: Rate Your Plate Scores:     Nutrition Assessments - 11/25/15 1648      MEDFICTS Scores   Pre Score 6      Nutrition Goals Re-Evaluation:   Psychosocial: Target Goals: Acknowledge presence or absence of depression, maximize coping skills, provide positive support system. Participant is  able to verbalize types and ability to use techniques and skills needed for reducing stress and depression.  Initial Review & Psychosocial Screening:     Initial Psych Review & Screening - 11/25/15 [place]? Yes   Comments Concerned about BP being extremely elevated     Barriers   Psychosocial barriers to participate in program There are no identifiable barriers or psychosocial needs.     Screening Interventions   Interventions Encouraged to exercise      Quality of Life Scores:     Quality of Life - 03/01/16 1446      Quality of Life Scores   Health/Function Pre 22.13 %   Health/Function Post 29.2 %   Health/Function % Change 31.95 %   Socioeconomic Pre 20.6 %   Socioeconomic Post 27.57 %   Socioeconomic % Change  33.83 %   Psych/Spiritual Pre 23.36 %   Psych/Spiritual Post 30 %   Psych/Spiritual % Change 28.42 %   Family Pre 25.13 %   Family Post 27.6 %   Family % Change 9.83 %   GLOBAL Pre 22.59 %   GLOBAL Post 28.79 %   GLOBAL % Change 27.45 %      PHQ-9: Recent Review Flowsheet Data    Depression screen [facility] 2/9 11/25/2015   Decreased Interest 0   Down, Depressed, Hopeless 0   PHQ - 2 Score 0   Altered sleeping 1   Tired, decreased energy 1   Change in appetite 0   Feeling bad or failure about yourself  0   Trouble concentrating 0   Moving slowly or fidgety/restless 0   Suicidal thoughts 0   PHQ-9 Score 2   Difficult doing work/chores Somewhat difficult      Psychosocial Evaluation and Intervention:   Psychosocial Re-Evaluation:     Psychosocial Re-Evaluation    Row Name 12/15/15 1455 01/19/16 1156 02/16/16 1524 03/11/16 1352       Psychosocial Re-Evaluation   Interventions Encouraged to attend Cardiac Rehabilitation for the exercise Relaxation education;Encouraged to attend Cardiac Rehabilitation for the exercise;Stress management education Encouraged to attend Cardiac Rehabilitation for the exercise -   Encourage patient to exercise on her own at home.     Comments  -  - Patient has great overall score on PHQ-9 22.59.  Patient has great overall score on PHQ-9 22.59 at entrance. QOL score at exit was 28.79 an increase of 27.45%     Continued Psychosocial Services Needed No No No No       Vocational Rehabilitation: Provide vocational rehab assistance to qualifying candidates.   Vocational Rehab Evaluation & Intervention:     Vocational Rehab - 11/25/15 [id number]  Initial Vocational Rehab Evaluation & Intervention   Assessment shows need for Vocational Rehabilitation No      Education: Education Goals: Education classes will be provided on a weekly basis, covering required topics. Participant will state understanding/return demonstration of topics presented.  Learning Barriers/Preferences:     Learning Barriers/Preferences - 11/25/15 1635      Learning Barriers/Preferences   Learning Barriers None   Learning Preferences Skilled Demonstration  Kinesthetic/Doing      Education Topics: Hypertension, Hypertension Reduction -Define heart disease and high blood pressure. Discus how high blood pressure affects the body and ways to reduce high blood pressure. Flowsheet Row CARDIAC REHAB PHASE II EXERCISE from 02/25/2016 in [place]  Date  02/25/16  Educator  DC  Instruction Review Code  2- meets goals/outcomes      Exercise and Your Heart -Discuss why it is important to exercise, the FITT principles of exercise, normal and abnormal responses to exercise, and how to exercise safely. Flowsheet Row CARDIAC REHAB PHASE II EXERCISE from 02/25/2016 in [place]  Date  12/03/15  Educator  DC  Instruction Review Code  2- meets goals/outcomes      Angina -Discuss definition of angina, causes of angina, treatment of angina, and how to decrease risk of having angina. Flowsheet Row CARDIAC REHAB PHASE II EXERCISE from 02/25/2016 in [place]  Date  12/10/15  Educator  -- [D Ryenne Lynam]  Instruction Review Code  2- meets goals/outcomes      Cardiac Medications -Review what the following cardiac medications are used for, how they affect the body, and side effects that may occur when taking the medications.  Medications include Aspirin, Beta blockers, calcium channel blockers, ACE Inhibitors, angiotensin receptor blockers, diuretics, digoxin, and antihyperlipidemics. Flowsheet Row CARDIAC REHAB PHASE II EXERCISE from 02/25/2016 in [place]  Date  12/17/15  Educator  D. Iyanni Hepp  Instruction Review Code  2- meets goals/outcomes      Congestive Heart Failure -Discuss the definition of CHF, how to live with CHF, the signs and symptoms of CHF, and how keep track of weight and sodium intake.   Heart Disease and Intimacy -Discus the effect sexual activity has on the heart, how changes occur during intimacy as we age, and safety during sexual activity. Flowsheet Row CARDIAC REHAB PHASE II EXERCISE from 02/25/2016 in [place]  Date  12/31/15  Educator  DC  Instruction Review Code  2- meets goals/outcomes      Smoking Cessation / COPD -Discuss different methods to quit smoking, the health benefits of quitting smoking, and the definition of COPD. Flowsheet Row CARDIAC REHAB PHASE II EXERCISE from 02/25/2016 in [place]  Date  01/07/16  Educator  Russella Dar  Instruction Review Code  2- meets goals/outcomes      Nutrition I: Fats -Discuss the types of cholesterol, what cholesterol does to the heart, and how cholesterol levels can be controlled. Flowsheet Row CARDIAC REHAB PHASE II EXERCISE from 02/25/2016 in [place]  Date  01/14/16  Educator  Russella Dar  Instruction Review Code  2- meets goals/outcomes      Nutrition II: Labels -Discuss the different components of food labels and how to read food  label [place] from 02/25/2016 in [place]  Date  01/21/16  Educator  Russella Dar  Instruction Review Code  2- meets goals/outcomes      Heart  Parts and Heart Disease -Discuss the anatomy of the heart, the pathway of blood circulation through the heart, and these are affected by heart disease. Flowsheet Row CARDIAC REHAB PHASE II EXERCISE from 02/25/2016 in [place]  Date  01/28/16  Educator  DC  Instruction Review Code  2- meets goals/outcomes      Stress I: Signs and Symptoms -Discuss the causes of stress, how stress may lead to anxiety and depression, and ways to limit stress.   Stress II: Relaxation -Discuss different types of relaxation techniques to limit stress. Flowsheet Row CARDIAC REHAB PHASE II EXERCISE from 02/25/2016 in [place]  Date  02/11/16  Educator  DC/DJ  Instruction Review Code  2- meets goals/outcomes      Warning Signs of Stroke / TIA -Discuss definition of a stroke, what the signs and symptoms are of a stroke, and how to identify when someone is having stroke. Flowsheet Row CARDIAC REHAB PHASE II EXERCISE from 02/25/2016 in [place]  Date  02/18/16  Educator  DJ/DC  Instruction Review Code  2- meets goals/outcomes      Knowledge Questionnaire Score:     Knowledge Questionnaire Score - 11/25/15 1637      Knowledge Questionnaire Score   Pre Score 19/24      Core Components/Risk Factors/Patient Goals at Admission:     Personal Goals and Risk Factors at Admission - 11/25/15 1717      Core Components/Risk Factors/Patient Goals on Admission    Weight Management Weight Maintenance   Sedentary --  No   Increase Strength and Stamina Yes   Intervention Provide advice, education, support and counseling about physical activity/exercise needs.;Develop an individualized exercise prescription for aerobic and resistive  training based on initial evaluation findings, risk stratification, comorbidities and participant's personal goals.   Expected Outcomes Achievement of increased cardiorespiratory fitness and enhanced flexibility, muscular endurance and strength shown through measurements of functional capacity and personal statement of participant.   Tobacco Cessation --  NO   Diabetes --  Patient says no. AIC is 6.1.    Heart Failure --  NO   Hypertension Yes   Intervention Provide education on lifestyle modifcations including regular physical activity/exercise, weight management, moderate sodium restriction and increased consumption of fresh fruit, vegetables, and low fat dairy, alcohol moderation, and smoking cessation.;Monitor prescription use compliance.   Expected Outcomes Short Term: Continued assessment and intervention until BP is < 140/61mm HG in hypertensive participants. < 130/18mm HG in hypertensive participants with diabetes, heart failure or chronic kidney disease.;Long Term: Maintenance of blood pressure at goal levels.   Lipids --  WNL   Stress --  None   Personal Goal Other Yes   Personal Goal Gain strength and stamina, Get back to ADL's without elevated BP.   Intervention Check to see if taking the correct dose of medication for BP.   Expected Outcomes Become stronger and to see BP come down to within normal limits.       Core Components/Risk Factors/Patient Goals Review:      Goals and Risk Factor Review    Row Name 12/15/15 1452 01/19/16 1153 02/16/16 1515 03/11/16 1351       Core Components/Risk Factors/Patient Goals Review   Personal Goals Review Weight Management/Obesity;Increase Strength and Stamina;Hypertension Weight Management/Obesity;Increase Strength and Stamina;Hypertension  - Weight Management/Obesity;Increase Strength and Stamina;Hypertension    Review lost 2 lbs. patient stated she feels stronger and can tell the exercise is working for her.  BP is still hypertensive  but better than when first started. Systolic when started was 180s. Now is around 150s BP has been better. pt has lost 3 lbs in last 30 days.  Patient BP has reached more normal limits. Patient is gaining strength in her arms and legs. Patient is continuing to lose weight.  Patient BP has reached more normal limits. Patient is gaining strength in her arms and legs. Patient is continuing to lose weight but she wanted to gain weight.      Expected Outcomes Patient to get stronger, have controlled BP, lose weight Patient to get stronger, have controlled BP, lose weight Patient will continue to get stronger, keep BP controlled.  Patient will continue to get stronger, keep BP controlled. Patient will continue to exercise at home. She graduated on 03/01/16 with 36 sessions.        Core Components/Risk Factors/Patient Goals at Discharge (Final Review):      Goals and Risk Factor Review - 03/11/16 1351      Core Components/Risk Factors/Patient Goals Review   Personal Goals Review Weight Management/Obesity;Increase Strength and Stamina;Hypertension   Review Patient BP has reached more normal limits. Patient is gaining strength in her arms and legs. Patient is continuing to lose weight but she wanted to gain weight.     Expected Outcomes Patient will continue to get stronger, keep BP controlled. Patient will continue to exercise at home. She graduated on 03/01/16 with 36 sessions.       ITP Comments:     ITP Comments    Row Name 11/25/15 1715           ITP Comments Unable to due patients walk test on orientation day due to BP being too elevated. Will do the walk test on the day she comes back to exercise. Will send ITP for initial signature without initial walk test results. [place] add results later date.           Comments: 30 day review. Patient did well in the program. Graduated on 03/01/16 at 36 sessions.

## 2016-03-12 NOTE — Progress Notes (Signed)
Discharge Summary  Patient Details  Name: [name] [name] MRN: [id number] Date of Birth: 1940-12-30 Referring Provider:   Flowsheet Row CARDIAC REHAB PHASE II ORIENTATION from 11/25/2015 in [place]  Referring Provider  [name]       Number of Visits: 75  Reason for Discharge:  Patient reached a stable level of exercise. Patient independent in their exercise.  Smoking History:  History  Smoking Status  . Never Smoker  Smokeless Tobacco  . Not on file    Diagnosis:  S/P CABG x 3  Myocardial infarction involving other coronary artery of inferior wall (HCC)  ADL UCSD:   Initial Exercise Prescription:     Initial Exercise Prescription - 11/25/15 1600      Date of Initial Exercise RX and Referring Provider   Date 11/25/15   Referring Provider [name]     Treadmill   MPH 1.5   Grade 0   Minutes 15   METs 2.14     NuStep   Level 2   Watts 25   Minutes 15   METs 1.9     Prescription Details   Frequency (times per week) 3   Duration Progress to 30 minutes of continuous aerobic without signs/symptoms of physical distress     Intensity   THRR REST +  30   THRR 40-80% of Max Heartrate 101-116-130   Ratings of Perceived Exertion 11-15   Perceived Dyspnea 0-4     Progression   Progression Continue to progress workloads to maintain intensity without signs/symptoms of physical distress.     Resistance Training   Training Prescription Yes   Weight 1   Reps 10-12      Discharge Exercise Prescription (Final Exercise Prescription Changes):     Exercise Prescription Changes - 03/01/16 1400      Exercise Review   Progression Yes     Response to Exercise   Blood Pressure (Admit) 126/74   Blood Pressure (Exercise) 156/92   Blood Pressure (Exit) 122/90   Heart Rate (Admit) 61 bpm   Heart Rate (Exercise) 82 bpm   Heart Rate (Exit) 61 bpm   Rating of Perceived Exertion (Exercise) 11   Symptoms No   Duration Progress to 30  minutes of continuous aerobic without signs/symptoms of physical distress   Intensity Rest + 30     Progression   Progression Continue to progress workloads to maintain intensity without signs/symptoms of physical distress.     Resistance Training   Training Prescription Yes   Weight 3.0   Reps 10-12     Interval Training   Interval Training No     Treadmill   MPH 0   Grade 0   Minutes 0   METs 0     NuStep   Level 3   Watts 33   Minutes 15   METs 3.47     Arm/Foot Ergometer   Level 3.1   Watts 21   Minutes 20   METs 2.7     Home Exercise Plan   Plans to continue exercise at Home   Frequency Add 2 additional days to program exercise sessions.      Functional Capacity:     6 Minute Walk    Row Name 03/01/16 1436         6 Minute Walk   Phase Discharge     Distance 1600 feet     Distance % Change 33.33 %     Walk Time  6 minutes     # of Rest Breaks 0     MPH 3.03     METS 3.32     RPE 10     Perceived Dyspnea  9     VO2 Peak 11.4     Symptoms No     Resting HR 62 bpm     Resting BP 112/60     Max Ex. HR 99 bpm     Max Ex. BP 134/70     2 Minute Post BP 124/66        Psychological, QOL, Others - Outcomes: PHQ 2/9: Depression screen [facility] 2/9 03/01/2016 11/25/2015  Decreased Interest 0 0  Down, Depressed, Hopeless 0 0  PHQ - 2 Score 0 0  Altered sleeping 0 1  Tired, decreased energy 0 1  Change in appetite 1 0  Feeling bad or failure about yourself  0 0  Trouble concentrating 0 0  Moving slowly or fidgety/restless 0 0  Suicidal thoughts 0 0  PHQ-9 Score 1 2  Difficult doing work/chores Not difficult at all Somewhat difficult    Quality of Life:     Quality of Life - 03/01/16 1446      Quality of Life Scores   Health/Function Pre 22.13 %   Health/Function Post 29.2 %   Health/Function % Change 31.95 %   Socioeconomic Pre 20.6 %   Socioeconomic Post 27.57 %   Socioeconomic % Change  33.83 %   Psych/Spiritual Pre 23.36 %    Psych/Spiritual Post 30 %   Psych/Spiritual % Change 28.42 %   Family Pre 25.13 %   Family Post 27.6 %   Family % Change 9.83 %   GLOBAL Pre 22.59 %   GLOBAL Post 28.79 %   GLOBAL % Change 27.45 %      Personal Goals: Goals established at orientation with interventions provided to work toward goal.     Personal Goals and Risk Factors at Admission - 11/25/15 1717      Core Components/Risk Factors/Patient Goals on Admission    Weight Management Weight Maintenance   Sedentary --  No   Increase Strength and Stamina Yes   Intervention Provide advice, education, support and counseling about physical activity/exercise needs.;Develop an individualized exercise prescription for aerobic and resistive training based on initial evaluation findings, risk stratification, comorbidities and participant's personal goals.   Expected Outcomes Achievement of increased cardiorespiratory fitness and enhanced flexibility, muscular endurance and strength shown through measurements of functional capacity and personal statement of participant.   Tobacco Cessation --  NO   Diabetes --  Patient says no. AIC is 6.1.    Heart Failure --  NO   Hypertension Yes   Intervention Provide education on lifestyle modifcations including regular physical activity/exercise, weight management, moderate sodium restriction and increased consumption of fresh fruit, vegetables, and low fat dairy, alcohol moderation, and smoking cessation.;Monitor prescription use compliance.   Expected Outcomes Short Term: Continued assessment and intervention until BP is < 140/75m HG in hypertensive participants. < 130/832mHG in hypertensive participants with diabetes, heart failure or chronic kidney disease.;Long Term: Maintenance of blood pressure at goal levels.   Lipids --  WNL   Stress --  None   Personal Goal Other Yes   Personal Goal Gain strength and stamina, Get back to ADL's without elevated BP.   Intervention Check to see if  taking the correct dose of medication for BP.   Expected Outcomes Become stronger and to see BP  come down to within normal limits.        Personal Goals Discharge:     Goals and Risk Factor Review    Row Name 12/15/15 1452 01/19/16 1153 02/16/16 1515 03/01/16 1238 03/11/16 1351     Core Components/Risk Factors/Patient Goals Review   Personal Goals Review Weight Management/Obesity;Increase Strength and Stamina;Hypertension Weight Management/Obesity;Increase Strength and Stamina;Hypertension  - Increase Strength and Stamina;Weight Management/Obesity;Other  Get b/p under controll.  Weight Management/Obesity;Increase Strength and Stamina;Hypertension   Review lost 2 lbs. patient stated she feels stronger and can tell the exercise is working for her.  BP is still hypertensive but better than when first started. Systolic when started was 180s. Now is around 150s BP has been better. pt has lost 3 lbs in last 30 days.  Patient BP has reached more normal limits. Patient is gaining strength in her arms and legs. Patient is continuing to lose weight.  Upon graduation, patient lost 6.3 lbs during the program. Her b/p improved through out the program and was well WNL at graduation. She met her goal of increased strenght and stamina.  Patient BP has reached more normal limits. Patient is gaining strength in her arms and legs. Patient is continuing to lose weight but she wanted to gain weight.     Expected Outcomes Patient to get stronger, have controlled BP, lose weight Patient to get stronger, have controlled BP, lose weight Patient will continue to get stronger, keep BP controlled.  Patient will continue to meet her goal exercising at home and eating a heart healthy diet.  Patient will continue to get stronger, keep BP controlled. Patient will continue to exercise at home. She graduated on 03/01/16 with 36 sessions.       Nutrition & Weight - Outcomes:     Pre Biometrics - 11/25/15 1646      Pre Biometrics    Waist Circumference 36 inches   Hip Circumference 37.5 inches   Waist to Hip Ratio 0.96 %   Triceps Skinfold 18 mm   Grip Strength 44 kg   Flexibility 12.8 in   Single Leg Stand 4 seconds         Post Biometrics - 03/01/16 1438       Post  Biometrics   Height '5\' 10"'$  (1.778 m)   Weight 157 lb 3 oz (71.3 kg)   Waist Circumference 32 inches   Hip Circumference 37.5 inches   Waist to Hip Ratio 0.85 %   BMI (Calculated) 22.6   Triceps Skinfold 16 mm   % Body Fat 32.2 %   Grip Strength 49 kg   Flexibility 15.3 in   Single Leg Stand 6 seconds      Nutrition:     Nutrition Therapy & Goals - 11/25/15 1645      Intervention Plan   Intervention Nutrition handout(s) given to patient.   Expected Outcomes Short Term Goal: Understand basic principles of dietary content, such as calories, fat, sodium, cholesterol and nutrients.;Long Term Goal: Adherence to prescribed nutrition plan.      Nutrition Discharge:     Nutrition Assessments - 03/12/16 1236      MEDFICTS Scores   Pre Score 6   Post Score 9   Score Difference 3      Education Questionnaire Score:     Knowledge Questionnaire Score - 03/12/16 1235      Knowledge Questionnaire Score   Pre Score 19/24   Post Score 22/24      Goals reviewed  with patient; copy given to patient.

## 2016-03-31 ENCOUNTER — Encounter: Payer: Self-pay | Admitting: Cardiology

## 2016-03-31 NOTE — Progress Notes (Signed)
Cardiology Office Note  Date: 04/01/2016   ID: Ethyl, Gain 12-Mar-1941, MRN [id number]  PCP: [name], MD  Primary Cardiologist: Rozann Lesches, MD   Chief Complaint  Patient presents with  . Coronary Artery Disease    History of Present Illness: [name] [name] is a medically complex 75 y.o. female that I am meeting for the first time in clinic today. I reviewed extensive records and updated her chart. She has history of inferior STEMI back in January of this year with cardiac catheterization revealing multivessel CAD. She did undergo PTCA of the posterior descending, however ultimately required CABG with Dr. Prescott Gum for more complete revascularization. She underwent placement of LIMA to LAD, SVG to ramus, and SVG to PDA. 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 She underwent pectoralis muscle repair. She has had follow-up with Ms. Lawrence NP as well as [facility]CTS as of May.  She presents today doing well. She completed cardiac rehabilitation and has been trying to do her own exercise at the senior center. She may be joining the [employer]. She does not report any angina. Still has postsurgical chest soreness and paresthesias but these seem to be improving slowly. She has had no fevers or chills.  I reviewed her current medications. She reports compliance. She has been checking her blood pressure at home and notices systolics in the [id number] fairly consistently. We discussed adding low-dose Norvasc. She states that she has tolerated this medicine previously.  Past Medical History:  Diagnosis Date  . CAD (coronary artery disease)    Multivessel, PTCA of posterior descending followed by CABG January 2017  . Essential hypertension   . ST elevation myocardial infarction (STEMI) of inferior wall [facility])    January 2017  . Wound infection after surgery    Sternal wound infection  requiring debridement, pectoralis muscle flap repair, wound [facility] February 2017    Past Surgical History:  Procedure Laterality Date  . ABDOMINAL HYSTERECTOMY    . APPENDECTOMY    . APPLICATION OF WOUND VAC N/A 09/04/2015   Procedure: WOUND VAC CHANGE;  Surgeon: [name], MD;  Location: [place];  Service: Thoracic;  Laterality: N/A;  . BACK SURGERY    . CARDIAC CATHETERIZATION N/A 08/10/2015   Procedure: Left Heart Cath and Coronary Angiography;  Surgeon: [name], MD;  Location: [place] CV LAB;  Service: Cardiovascular;  Laterality: N/A;  . CARDIAC CATHETERIZATION  08/10/2015   Procedure: Coronary Balloon Angioplasty;  Surgeon: [name], MD;  Location: [place] CV LAB;  Service: Cardiovascular;;  . CHEST TUBE INSERTION Left 09/08/2015   Procedure: CHEST TUBE INSERTION;  Surgeon: [name], MD;  Location: [place];  Service: Thoracic;  Laterality: Left;  . COLONOSCOPY N/A 03/29/2013   Procedure: COLONOSCOPY;  Surgeon: Rogene Houston, MD;  [facility];  Service: Endoscopy;  Laterality: N/A;  930  . CORONARY ARTERY BYPASS GRAFT N/A 08/14/2015   Procedure: CORONARY ARTERY BYPASS GRAFTING (CABG)X3 LIMA-LAD; SVG-RAMUS; SVG-PD TRANSESOPHAGEAL ECHOCARDIOGRAM (TEE);  Surgeon: [name], MD;  Location: [place];  Service: Open Heart Surgery;  Laterality: N/A;  . PECTORALIS FLAP N/A 09/08/2015   Procedure: Muscle FLAP;  Surgeon: [name], DO;  Location: [place];  Service: Plastics;  Laterality: N/A;  . STERNAL WOUND DEBRIDEMENT N/A 09/01/2015   Procedure: STERNAL WOUND DEBRIDEMENT;  Surgeon: [name], MD;  Location: [place] OR;  Service: Thoracic;  Laterality: N/A;  . STERNAL WOUND DEBRIDEMENT N/A 09/04/2015   Procedure: STERNAL WOUND DEBRIDEMENT;  Surgeon: [name], MD;  Location: [place];  Service: Thoracic;  Laterality: N/A;  . STERNAL WOUND DEBRIDEMENT N/A 09/08/2015   Procedure: STERNAL WOUND DEBRIDEMENT;  Surgeon: [name], MD;   Location: [place];  Service: Thoracic;  Laterality: N/A;  . TEE WITHOUT CARDIOVERSION N/A 08/14/2015   Procedure: TRANSESOPHAGEAL ECHOCARDIOGRAM (TEE);  Surgeon: [name], MD;  Location: [place];  Service: Open Heart Surgery;  Laterality: N/A;    Current Outpatient Prescriptions  Medication Sig Dispense Refill  . aspirin EC 325 MG EC tablet Take 1 tablet (325 mg total) by mouth daily.    Marland Kitchen atorvastatin (LIPITOR) 80 MG tablet Take 1 tablet (80 mg total) by mouth daily at 6 PM. 30 tablet 11  . docusate sodium (DOCU SOFT) 100 MG capsule Take 100 mg by mouth 2 (two) times daily.    Marland Kitchen losartan-hydrochlorothiazide (HYZAAR) 100-25 MG tablet Take 1 tablet by mouth daily.     . Metoprolol Tartrate 75 MG TABS Take 75 mg by mouth 2 (two) times daily.    . Multiple Vitamin (MULTIVITAMIN) tablet Take 1 tablet by mouth daily.    Marland Kitchen amLODipine (NORVASC) 2.5 MG tablet Take 1 tablet (2.5 mg total) by mouth daily. 90 tablet 1   No current facility-administered medications for this visit.    Allergies:  Other and Peanut-containing drug products   Social History: The patient  reports that she has never smoked. She has never used smokeless tobacco. She reports that she does not drink alcohol or use drugs.   ROS:  Please see the history of present illness. Otherwise, complete review of systems is positive for tick bite left lower leg, no rash.  All other systems are reviewed and negative.   Physical Exam: VS:  BP 138/86   Pulse 67   Ht 5\' 10"  (1.778 m)   Wt 155 lb (70.3 kg)   SpO2 98%   BMI 22.24 kg/m , BMI Body mass index is 22.24 kg/m.  Wt Readings from Last 3 Encounters:  04/01/16 155 lb (70.3 kg)  03/01/16 157 lb 3 oz (71.3 kg)  12/24/15 160 lb (72.6 kg)    General: Patient appears comfortable at rest. Appears younger than stated age. HEENT: Conjunctiva and lids normal, oropharynx clear. Neck: Supple, no elevated JVP or carotid bruits, no thyromegaly. Thorax: Well-healed sternal incision, no  erythema or drainage. Lungs: Clear to auscultation, nonlabored breathing at rest. Cardiac: Regular rate and rhythm, no S3 or significant systolic murmur, no pericardial rub. Abdomen: Soft, nontender, bowel sounds present, no guarding or rebound. Extremities: No pitting edema, distal pulses 2+. Skin: Warm and dry. Musculoskeletal: No kyphosis. Neuropsychiatric: Alert and oriented x3, affect grossly appropriate.  ECG: I personally reviewed the tracing from 08/28/2015 which showed normal sinus rhythm with probable old inferior infarct pattern and nonspecific ST-T changes.  Recent Labwork: 08/11/2015: TSH 2.467 09/04/2015: Magnesium 1.6 09/10/2015: ALT 16; AST 20 09/14/2015: BUN 10; Creatinine, Ser 0.99; Hemoglobin 7.4; Platelets 392; Potassium 3.6; Sodium 141     Component Value Date/Time   CHOL 157 08/11/2015 0611   TRIG 73 08/11/2015 0611   HDL 51 08/11/2015 0611   CHOLHDL 3.1 08/11/2015 0611   VLDL 15 08/11/2015 0611   LDLCALC 91 08/11/2015 0611    Other Studies Reviewed Today:  Echocardiogram 08/11/2015: Study Conclusions  - Left ventricle: The cavity size was  normal. Wall thickness was   increased in a pattern of mild LVH. Systolic function was normal.   The estimated ejection fraction was in the range of 50% to 55%.   Probable moderate hypokinesis of the basal-midinferior   myocardium. Doppler parameters are consistent with abnormal left   ventricular relaxation (grade 1 diastolic dysfunction). - Aortic valve: There was mild regurgitation. - Pericardium, extracardiac: A trivial pericardial effusion was   identified posterior to the heart.  Chest x-ray 12/24/2015: FINDINGS: There is no edema or consolidation. The heart size and pulmonary vascularity are within normal limits. Patient is status post coronary artery bypass grafting. Surgical clips are noted overlying the right hemithorax anteriorly, stable. No adenopathy evident. No bone lesions are  appreciable.  IMPRESSION: No edema or consolidation.  Stable cardiac silhouette.  Assessment and Plan:  1. Multivessel CAD status post CABG in January after inferior wall myocardial infarction. LVEF 50-55%. She had subsequent sternal wound infection requiring debridement, pectoralis muscle flap, and wound VAC. She has done very well and recovered since then, completed cardiac rehabilitation. I recommended that she continue with a maintenance exercise program.  2. Essential hypertension. We will continue current medications including Hyzaar and Lopressor, add Norvasc 2.5 mg daily. She will continue to check blood pressure at home.  3. LDL was 91 in January. She is been on high-dose Lipitor since then, reports pending lab work with PCP to assess lipids. Goal LDL around 70.  Current medicines were reviewed with the patient today.  Disposition: Follow-up with me in 6 months.  Signed, Satira Sark, MD, [facility] 04/01/2016 10:35 AM    [place] at [facility] [address], [place], [place] [address] Phone: [phone]; Fax: [phone]

## 2016-04-01 ENCOUNTER — Ambulatory Visit (INDEPENDENT_AMBULATORY_CARE_PROVIDER_SITE_OTHER): Payer: Medicare PPO | Admitting: Cardiology

## 2016-04-01 ENCOUNTER — Encounter: Payer: Self-pay | Admitting: Cardiology

## 2016-04-01 VITALS — BP 138/86 | HR 67 | Ht 70.0 in | Wt 155.0 lb

## 2016-04-01 DIAGNOSIS — Z79899 Other long term (current) drug therapy: Secondary | ICD-10-CM

## 2016-04-01 DIAGNOSIS — I10 Essential (primary) hypertension: Secondary | ICD-10-CM

## 2016-04-01 DIAGNOSIS — I2510 Atherosclerotic heart disease of native coronary artery without angina pectoris: Secondary | ICD-10-CM | POA: Diagnosis not present

## 2016-04-01 MED ORDER — AMLODIPINE BESYLATE 2.5 MG PO TABS: 2.5000 mg | ORAL_TABLET | Freq: Every day | ORAL | 1 refills | Status: DC

## 2016-04-01 NOTE — Patient Instructions (Signed)
Your physician wants you to follow-up in:  6 months with Dr Ferne Reus will receive a reminder letter in the mail two months in advance. If you don't receive a letter, please call our office to schedule the follow-up appointment.     START Norvasc 2. 5 mg daily     Thank you for choosing [facility] !

## 2016-04-13 ENCOUNTER — Other Ambulatory Visit: Payer: Self-pay | Admitting: Adult Health

## 2016-04-15 ENCOUNTER — Telehealth: Payer: Self-pay

## 2016-04-15 ENCOUNTER — Telehealth: Payer: Self-pay | Admitting: Cardiology

## 2016-04-15 MED ORDER — ATORVASTATIN CALCIUM 40 MG PO TABS: 40.0000 mg | ORAL_TABLET | Freq: Every day | ORAL | 3 refills | Status: DC

## 2016-04-15 NOTE — Telephone Encounter (Signed)
Pt is states she's having some tingling around her lips/nose and having a clammy feeling since starting amLODipine (NORVASC) 2.5 MG tablet [id number]

## 2016-04-15 NOTE — Telephone Encounter (Signed)
Received labs from pcp,per Dr Alpha Gula may reduce lipitor dose from 80 mg to 40 mg,pt verbalized uncderstanding

## 2016-04-15 NOTE — Telephone Encounter (Signed)
Will forward to Dr. McDowell 

## 2016-04-16 NOTE — Telephone Encounter (Signed)
Have her stop the Norvasc and keep an eye on blood pressure measurements at home.

## 2016-04-16 NOTE — Telephone Encounter (Signed)
Called pt to notify of medication change. No answer. Message left private voicemail with instructions to call office if she has questions.

## 2016-04-16 NOTE — Addendum Note (Signed)
Addended by: Levonne Hubert on: 04/16/2016 09:35 AM   Modules accepted: Orders

## 2016-07-30 ENCOUNTER — Telehealth: Payer: Self-pay | Admitting: Cardiology

## 2016-07-30 NOTE — Telephone Encounter (Signed)
LMTCB @ 1630 hrs-cc

## 2016-07-30 NOTE — Telephone Encounter (Signed)
Please give pt a call, she's concerned about her BP readings

## 2016-08-02 NOTE — Telephone Encounter (Signed)
Called pt., no answer. Left message for pt to return call.  

## 2016-08-03 NOTE — Telephone Encounter (Signed)
Left pt a message to return call.

## 2016-08-20 IMAGING — CT CT CHEST W/ CM
2 of 3 series · 15 of 36 positions shown, 18 images · IV contrast (Omnipaque 300)
Comparison: Chest two view 08/19/2015

CLINICAL DATA: CABG 08/14/2015. Now with redness and drainage from
incision.

EXAM:
CT CHEST WITH CONTRAST
TECHNIQUE: Multidetector CT imaging of the chest was performed during
intravenous contrast administration.
CONTRAST:  75mL OMNIPAQUE IOHEXOL 300 MG/ML  SOLN

[Series 3: chestroutine 5.0 b40f · axial · 0.72mm/px · z∈[-267,-2]mm · 12 of 63 slices shown, 15 images]
[im 5/63  mediastinal]
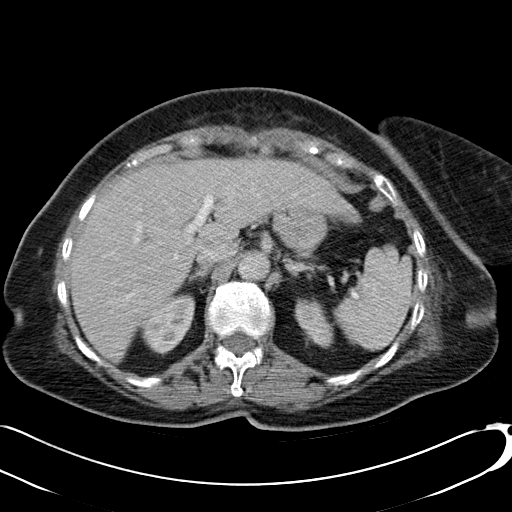
[im 5/63  lung]
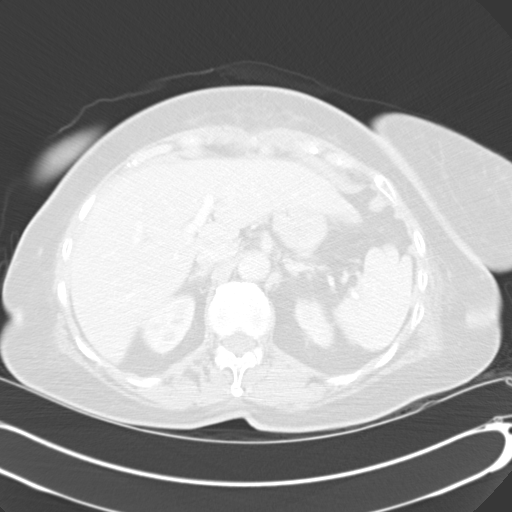
[im 10/63  lung]
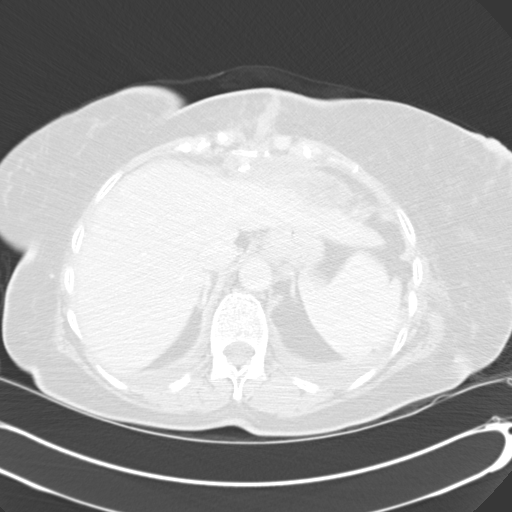
[im 14/63  lung]
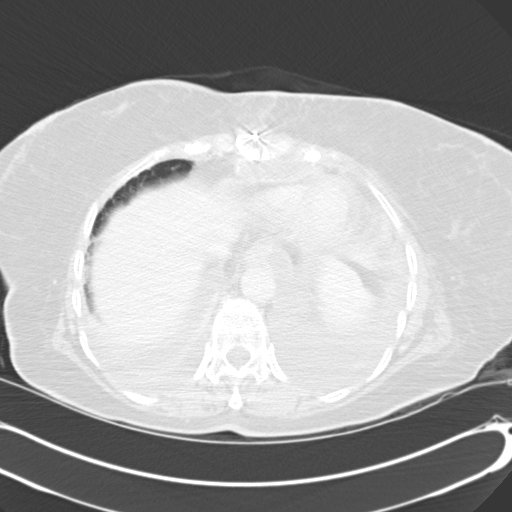
[im 19/63  lung]
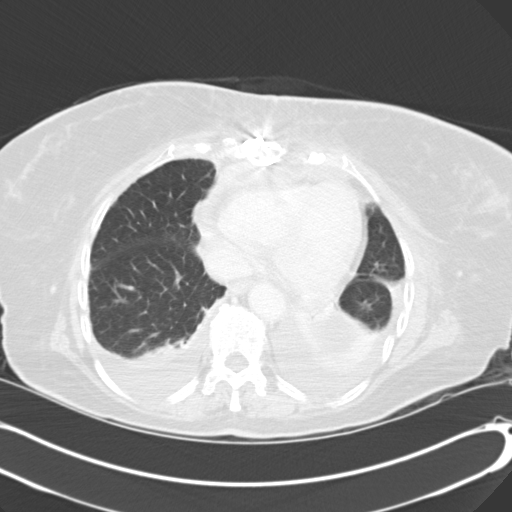
[im 23/63  mediastinal]
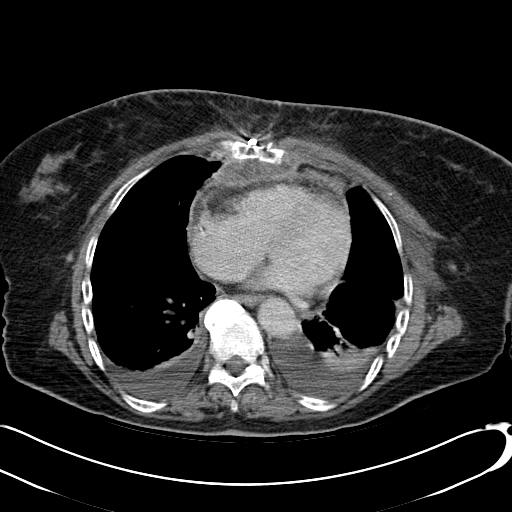
[im 23/63  lung]
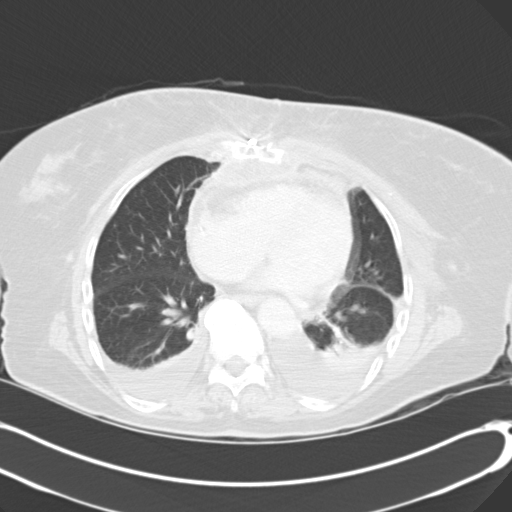
[im 28/63  lung]
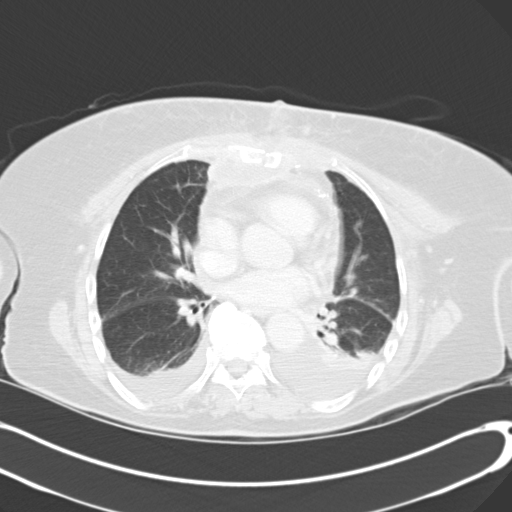
[im 35/63  lung]
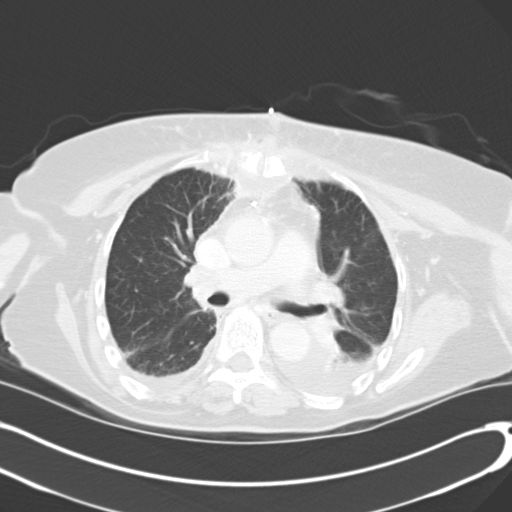
[im 40/63  lung]
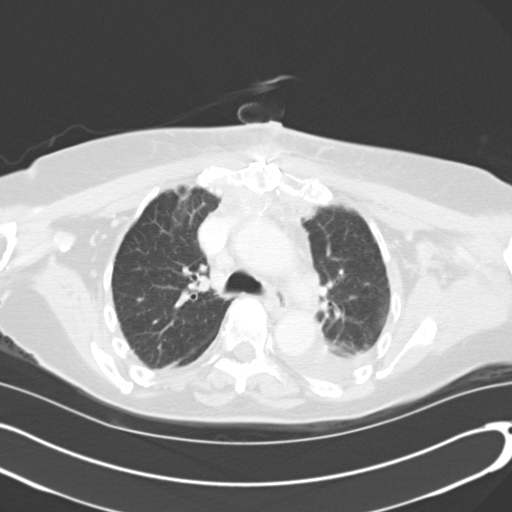
[im 44/63  mediastinal]
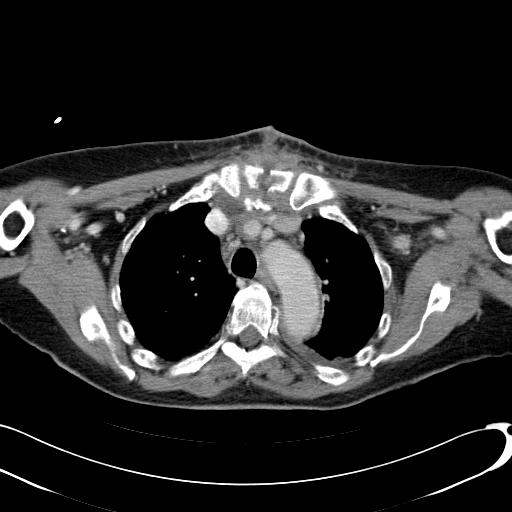
[im 44/63  lung]
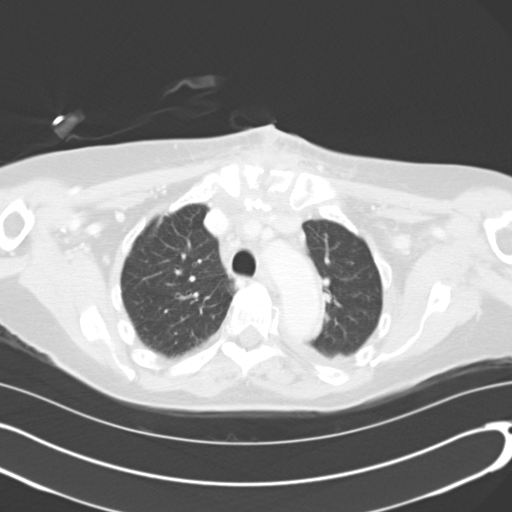
[im 49/63  lung]
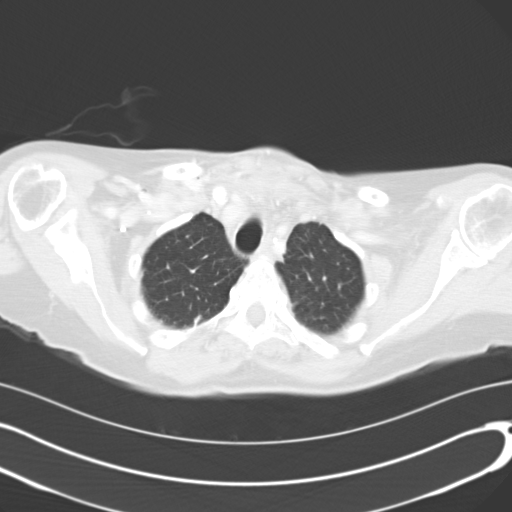
[im 53/63  lung]
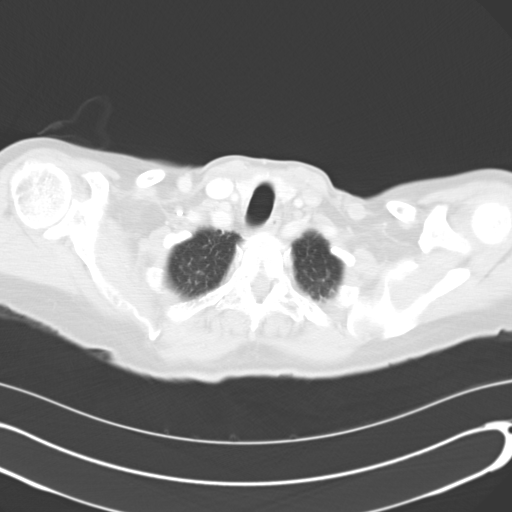
[im 58/63  lung]
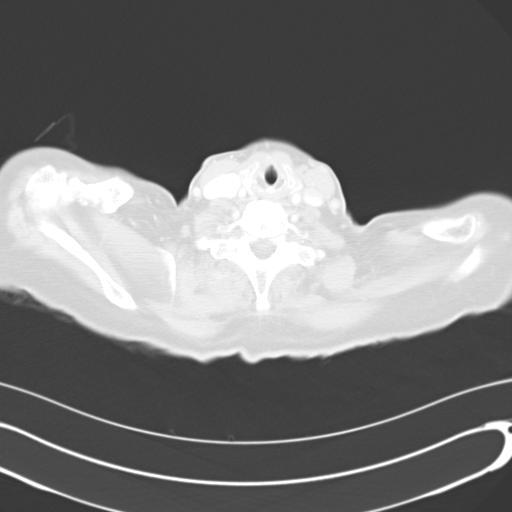

[Series 5: mpr coronal chest 3mm · coronal · 0.68mm/px · 3 of 92 slices shown]
[im 19/92  lung]
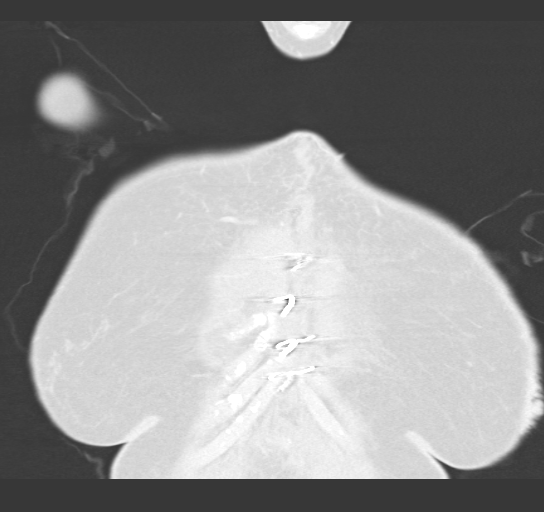
[im 37/92  lung]
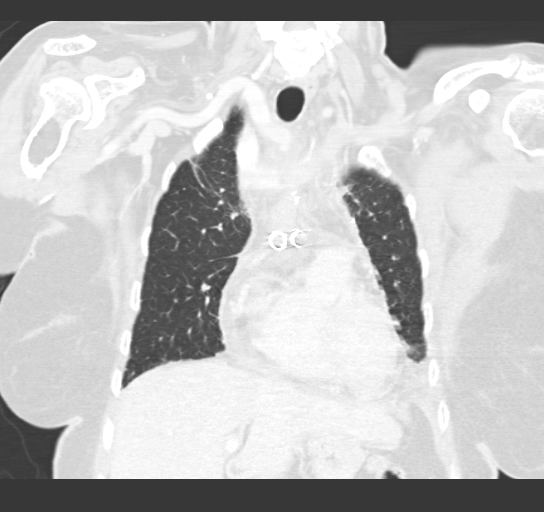
[im 55/92  lung]
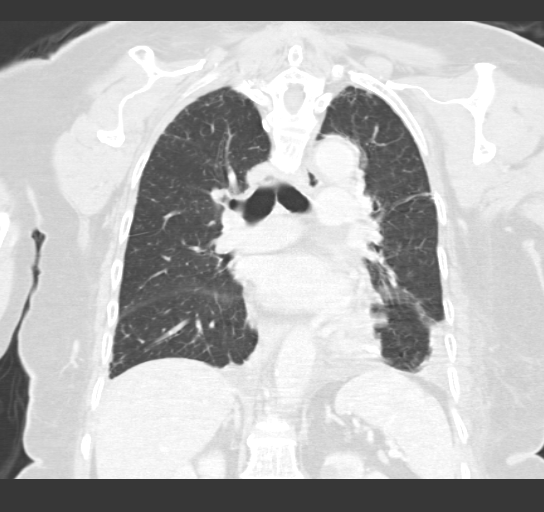

[15 of 36 positions shown; findings below may reference images not displayed]

FINDINGS: Extensive retrosternal fluid collection with rim enhancing wall
consistent with abscess. This is anterior to the pericardium and
posterior to the sternum. Fluid extends into the sternal notch were
there is a 2 cm fluid collection containing gas bubbles. Findings
are consistent with abscess. The retrosternal fluid collection
measures approximately 19 x 53 mm on transverse images.

There is thickening of the pericardium but no pericardial effusion.
Coronary calcification. Heart size normal.

Small bilateral pleural effusions with dependent atelectasis in both
lung bases. Negative for pneumonia. Negative for mass or adenopathy.

No thoracic spine fracture.
IMPRESSION: Large retrosternal abscess extending into the sternal notch.
Pericardial thickening without pericardial effusion

Small bilateral pleural effusions with bibasilar atelectasis.

These results were called by telephone at the time of interpretation
on 08/31/2015 at [DATE] to Dr. ANAR APOLLON , who verbally
acknowledged these results.

## 2016-08-23 IMAGING — CR DG CHEST 1V PORT
1 series · 1 of 1 positions shown · non-contrast
Comparison: September 01, 2015

CLINICAL DATA: Central catheter placement.  Hypertension.

EXAM:
PORTABLE CHEST 1 VIEW

[AP]
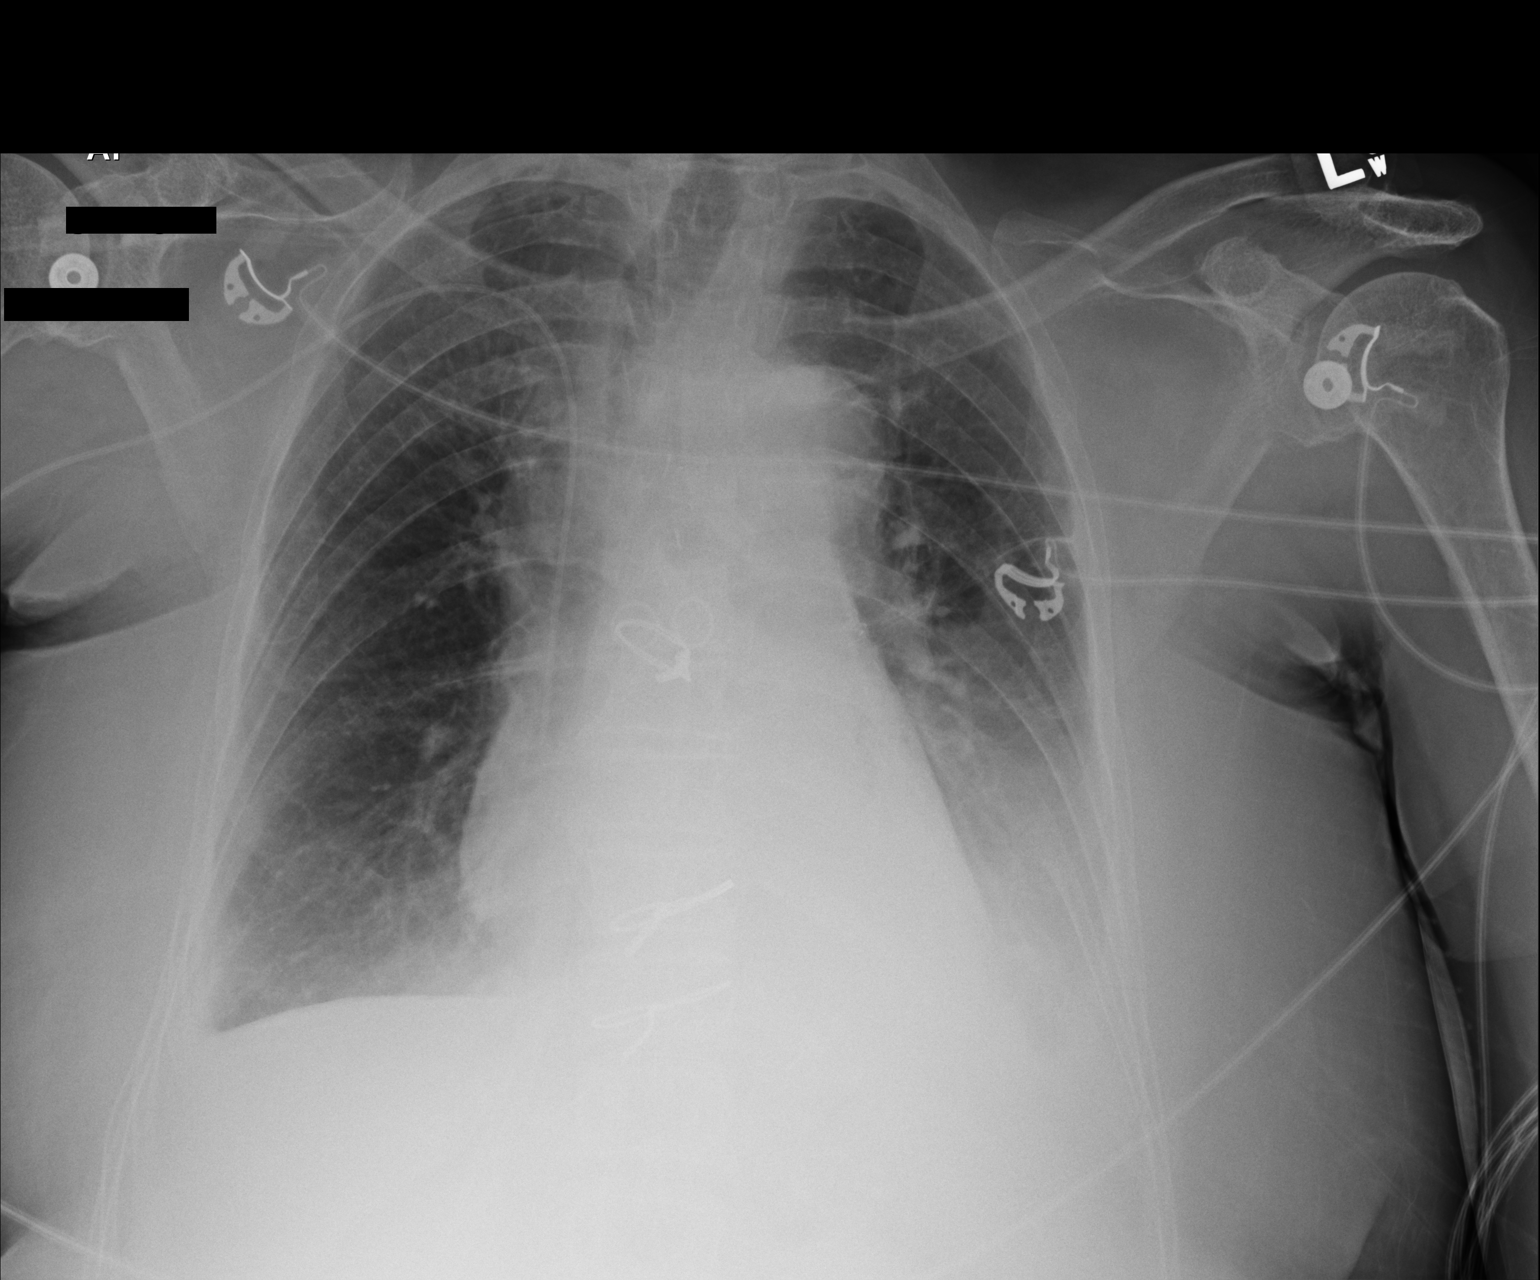

[1 of 1 positions shown; findings below may reference images not displayed]

FINDINGS: Central catheter tip is in the superior vena cava near the
cavoatrial junction. No pneumothorax. Lungs appear somewhat
hyperexpanded. There is persistent airspace consolidation left lower
lobe with a small left effusion. The right lung is clear. Heart is
slightly enlarged with pulmonary vascularity within normal limits,
stable. Patient is status post coronary artery bypass grafting.
IMPRESSION: Central catheter tip in superior vena cava near the cavoatrial
junction without pneumothorax. Persistent left lower lobe
consolidation with small left effusion. No change in cardiac
silhouette.

## 2016-08-24 IMAGING — DX DG CHEST 1V PORT
1 series · 1 of 1 positions shown · non-contrast
Comparison: Purple chest x-ray of 09/03/2015, CT chest of
08/31/2015

CLINICAL DATA: Sternal wound debridement, postop

EXAM:
PORTABLE CHEST 1 VIEW

[chest ap]
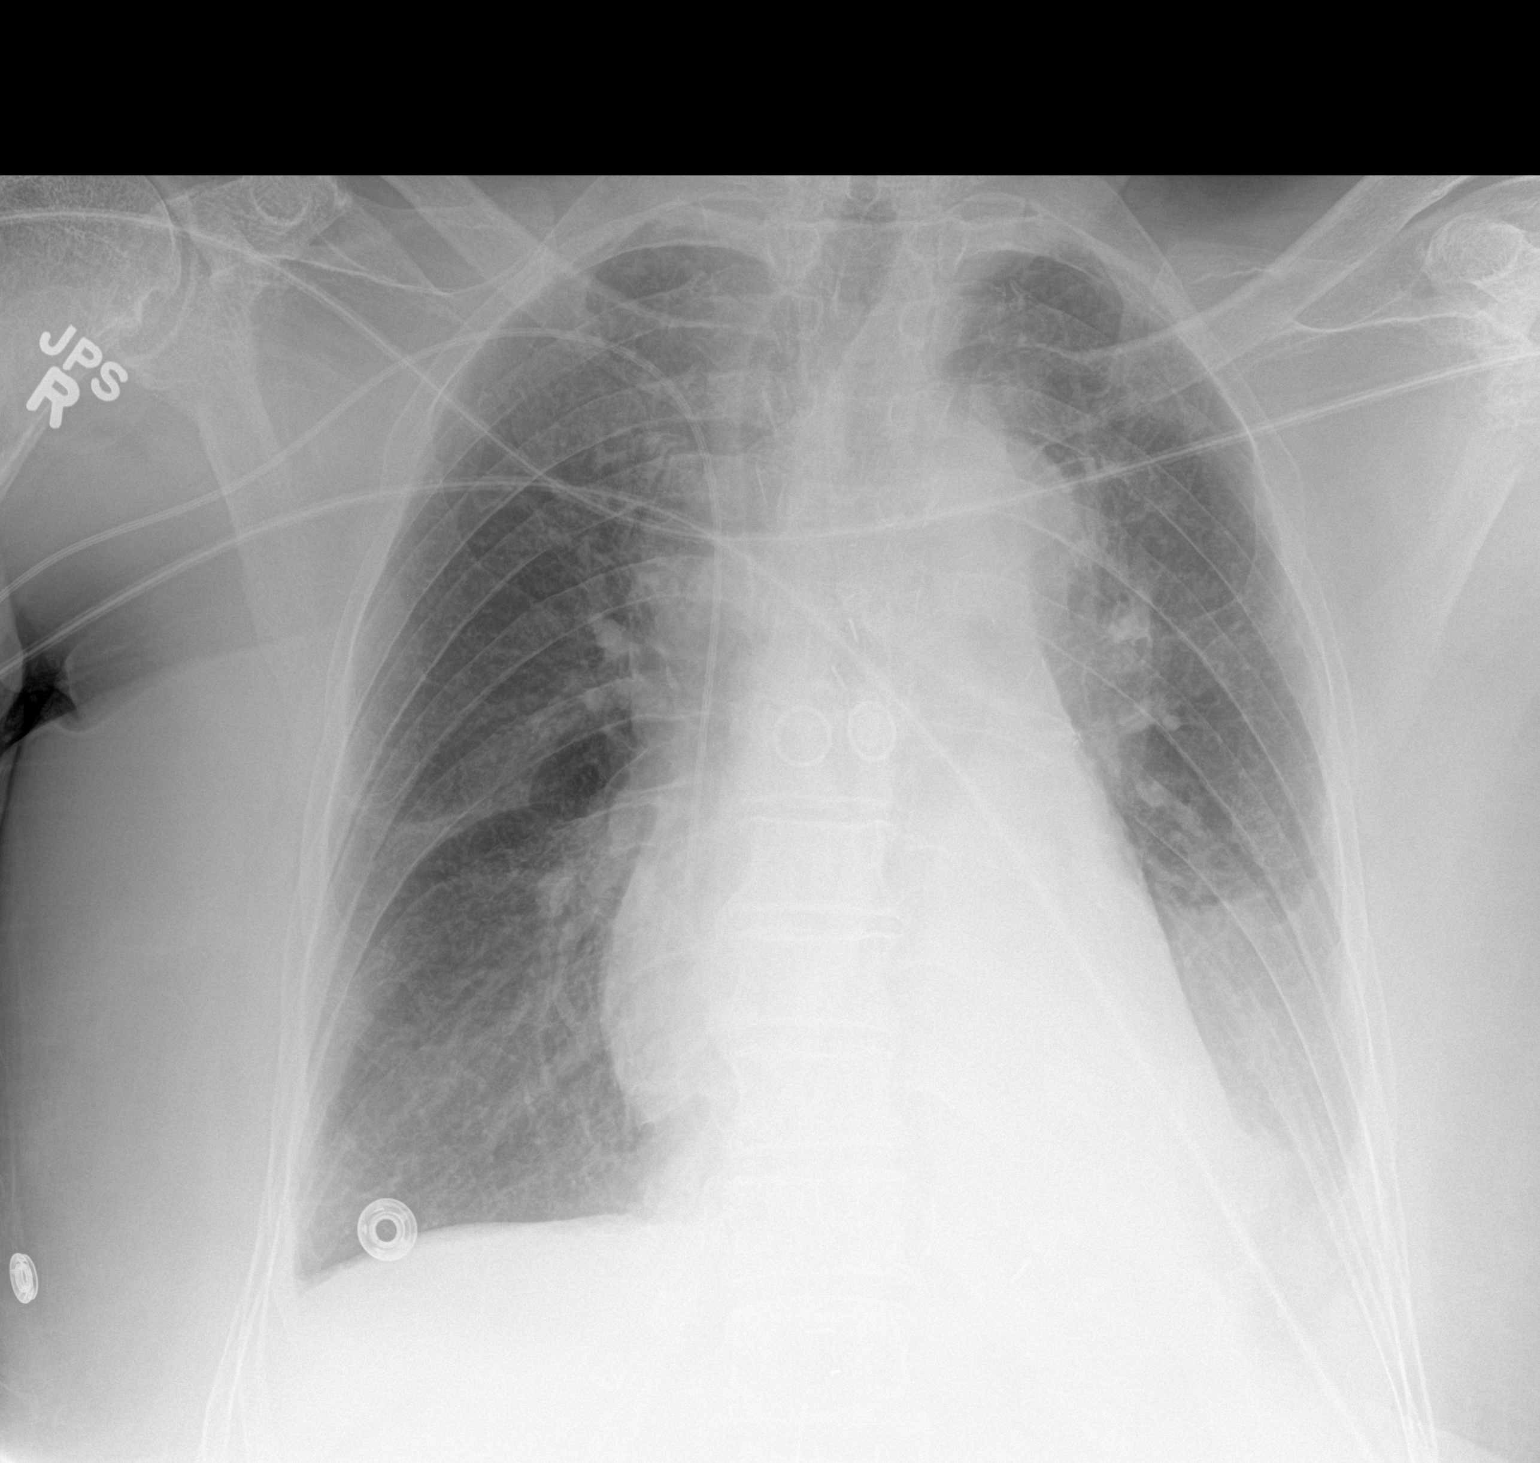

[1 of 1 positions shown; findings below may reference images not displayed]

FINDINGS: There is persistent opacity at the left lung base consistent with
left lower lobe atelectasis, pneumonia, and probable left pleural
effusion. There appears to be a tiny right pleural effusion present.
Mild cardiomegaly is stable and a PICC line remains unchanged in
position.
IMPRESSION: 1. Slightly better aeration although persistent opacity at the left
lung base is noted consistent with atelectasis, pneumonia, and left
pleural effusion.
2. Small right pleural effusion.

## 2016-09-27 NOTE — Progress Notes (Signed)
Cardiology Office Note  Date: 09/28/2016   ID: [name], [name] [birth year], MRN [id number]  PCP: [name], MD  Primary Cardiologist: Rozann Lesches, MD   Chief Complaint  Patient presents with  . Coronary Artery Disease    History of Present Illness: [name] [name] is a 76 y.o. female that I met back in September 2017. She presents for a follow-up visit. States that she has been doing well, [facility] the [facility] center, no angina or unusual shortness of breath.  Follow-up lipid panel from September of last year is outlined below. Lipitor was reduced from 80 mg daily to 40 mg daily. She has had no obvious statin intolerance.  Current medications include aspirin, Lipitor, Norvasc, Hyzaar, and Lopressor.  I personally reviewed her ECG today which shows sinus rhythm with prolonged PR interval and nonspecific ST-T wave abnormalities.  Past Medical History:  Diagnosis Date  . CAD (coronary artery disease)    Multivessel, PTCA of posterior descending followed by CABG January 2017  . Essential hypertension   . ST elevation myocardial infarction (STEMI) of inferior wall [facility])    January 2017  . Wound infection after surgery    Sternal wound infection requiring debridement, pectoralis muscle flap repair, wound [facility] February 2017    Past Surgical History:  Procedure Laterality Date  . ABDOMINAL HYSTERECTOMY    . APPENDECTOMY    . APPLICATION OF WOUND VAC N/A 09/04/2015   Procedure: WOUND VAC CHANGE;  Surgeon: [name], MD;  Location: [place];  Service: Thoracic;  Laterality: N/A;  . BACK SURGERY    . CARDIAC CATHETERIZATION N/A 08/10/2015   Procedure: Left Heart Cath and Coronary Angiography;  Surgeon: [name], MD;  Location: [place] CV LAB;  Service: Cardiovascular;  Laterality: N/A;  . CARDIAC CATHETERIZATION  08/10/2015   Procedure: Coronary Balloon Angioplasty;  Surgeon: [name], MD;  Location: [place] CV LAB;  Service:  Cardiovascular;;  . CHEST TUBE INSERTION Left 09/08/2015   Procedure: CHEST TUBE INSERTION;  Surgeon: [name], MD;  Location: [place];  Service: Thoracic;  Laterality: Left;  . COLONOSCOPY N/A 03/29/2013   Procedure: COLONOSCOPY;  Surgeon: Rogene Houston, MD;  [facility];  Service: Endoscopy;  Laterality: N/A;  930  . CORONARY ARTERY BYPASS GRAFT N/A 08/14/2015   Procedure: CORONARY ARTERY BYPASS GRAFTING (CABG)X3 LIMA-LAD; SVG-RAMUS; SVG-PD TRANSESOPHAGEAL ECHOCARDIOGRAM (TEE);  Surgeon: [name], MD;  Location: [place];  Service: Open Heart Surgery;  Laterality: N/A;  . PECTORALIS FLAP N/A 09/08/2015   Procedure: Muscle FLAP;  Surgeon: [name], DO;  Location: [place];  Service: Plastics;  Laterality: N/A;  . STERNAL WOUND DEBRIDEMENT N/A 09/01/2015   Procedure: STERNAL WOUND DEBRIDEMENT;  Surgeon: [name], MD;  Location: [place];  Service: Thoracic;  Laterality: N/A;  . STERNAL WOUND DEBRIDEMENT N/A 09/04/2015   Procedure: STERNAL WOUND DEBRIDEMENT;  Surgeon: [name], MD;  Location: [place];  Service: Thoracic;  Laterality: N/A;  . STERNAL WOUND DEBRIDEMENT N/A 09/08/2015   Procedure: STERNAL WOUND DEBRIDEMENT;  Surgeon: [name], MD;  Location: [place];  Service: Thoracic;  Laterality: N/A;  . TEE WITHOUT CARDIOVERSION N/A 08/14/2015   Procedure: TRANSESOPHAGEAL ECHOCARDIOGRAM (TEE);  Surgeon: [name], MD;  Location: [place];  Service: Open Heart Surgery;  Laterality: N/A;    Current Outpatient Prescriptions  Medication Sig Dispense Refill  . amLODipine (NORVASC) 2.5 MG tablet Take 2.5 mg  by mouth at bedtime.     Marland Kitchen aspirin EC 325 MG EC tablet Take 1 tablet (325 mg total) by mouth daily.    Marland Kitchen docusate sodium (DOCU SOFT) 100 MG capsule Take 100 mg by mouth 2 (two) times daily.    Marland Kitchen losartan-hydrochlorothiazide (HYZAAR) 100-25 MG tablet Take 1 tablet by mouth daily.     . Metoprolol Tartrate 75 MG TABS TAKE 1 TABLET BY MOUTH TWICE DAILY 60  tablet 6  . Multiple Vitamin (MULTIVITAMIN) tablet Take 1 tablet by mouth daily.    Marland Kitchen atorvastatin (LIPITOR) 40 MG tablet Take 1 tablet (40 mg total) by mouth daily. 90 tablet 3   No current facility-administered medications for this visit.    Allergies:  Other and Peanut-containing drug products   Social History: The patient  reports that she has never smoked. She has never used smokeless tobacco. She reports that she does not drink alcohol or use drugs.   ROS:  Please see the history of present illness. Otherwise, complete review of systems is positive for none.  All other systems are reviewed and negative.   Physical Exam: VS:  BP (!) 154/86   Pulse 62   Ht _0  (1.778 m)   Wt 161 lb (73 kg)   SpO2 99%   BMI 23.10 kg/m , BMI Body mass index is 23.1 kg/m.  Wt Readings from Last 3 Encounters:  09/28/16 161 lb (73 kg)  04/01/16 155 lb (70.3 kg)  03/01/16 157 lb 3 oz (71.3 kg)    General: Patient appears comfortable at rest. HEENT: Conjunctiva and lids normal, oropharynx clear. Neck: Supple, no elevated JVP or carotid bruits, no thyromegaly. Thorax: Well-healed sternal incision, no erythema or drainage. Lungs: Clear to auscultation, nonlabored breathing at rest. Cardiac: Regular rate and rhythm, no S3 or significant systolic murmur, no pericardial rub. Abdomen: Soft, nontender, bowel sounds present, no guarding or rebound. Extremities: No pitting edema, distal pulses 2+. Skin: Warm and dry. Musculoskeletal: No kyphosis. Neuropsychiatric: Alert and oriented x3, affect grossly appropriate.  ECG: I personally reviewed the tracing from 08/28/2015 which showed normal sinus rhythm with probable old inferior infarct pattern and nonspecific ST-T changes.  Recent Labwork:    Component Value Date/Time   CHOL 157 08/11/2015 0611   TRIG 73 08/11/2015 0611   HDL 51 08/11/2015 0611   CHOLHDL 3.1 08/11/2015 0611   VLDL 15 08/11/2015 0611   LDLCALC 91 08/11/2015 0611  September 2017:  BUN 14, creatinine 0.75, potassium 4.0, cholesterol 93, triglycerides 30, HDL 53, LDL 34.  Other Studies Reviewed Today:  Echocardiogram 08/11/2015: Study Conclusions  - Left ventricle: The cavity size was normal. Wall thickness was increased in a pattern of mild LVH. Systolic function was normal. The estimated ejection fraction was in the range of 50% to 55%. Probable moderate hypokinesis of the basal-midinferior myocardium. Doppler parameters are consistent with abnormal left ventricular relaxation (grade 1 diastolic dysfunction). - Aortic valve: There was mild regurgitation. - Pericardium, extracardiac: A trivial pericardial effusion was identified posterior to the heart.  Assessment and Plan:  1. Multivessel CAD status post CABG in January 2017. She is doing well at this point, exercising regularly without angina symptoms. Continue medical therapy and observation.  2. Essential hypertension, continuing current regimen however move Norvasc to evening dosing. May need to be further up titrated.  3. Hyperlipidemia, LDL down to 34 by last check. She is now on Lipitor at 40 mg daily.  4. History of sternal wound infection requiring debridement  and pectoralis flap repair. She has been stable, no unusual pain, fevers, or chills.  Current medicines were reviewed with the patient today.   Orders Placed This Encounter  Procedures  . EKG 12-Lead    Disposition: Follow-up in 6 months.  Signed, Satira Sark, MD, [facility] 09/28/2016 10:17 AM    [facility] [place]. [address] [place], [place] [address] Phone: [phone]; Fax: [phone]

## 2016-09-28 ENCOUNTER — Ambulatory Visit (INDEPENDENT_AMBULATORY_CARE_PROVIDER_SITE_OTHER): Payer: Medicare PPO | Admitting: Cardiology

## 2016-09-28 ENCOUNTER — Encounter: Payer: Self-pay | Admitting: Cardiology

## 2016-09-28 VITALS — BP 154/86 | HR 62 | Ht 70.0 in | Wt 161.0 lb

## 2016-09-28 DIAGNOSIS — I10 Essential (primary) hypertension: Secondary | ICD-10-CM | POA: Diagnosis not present

## 2016-09-28 DIAGNOSIS — I2510 Atherosclerotic heart disease of native coronary artery without angina pectoris: Secondary | ICD-10-CM

## 2016-09-28 DIAGNOSIS — E782 Mixed hyperlipidemia: Secondary | ICD-10-CM | POA: Diagnosis not present

## 2016-09-28 NOTE — Patient Instructions (Signed)
Your physician wants you to follow-up in: 6 months with Dr Ferne Reus will receive a reminder letter in the mail two months in advance. If you don't receive a letter, please call our office to schedule the follow-up appointment.     Take Norvasc at bedtime    If you need a refill on your cardiac medications before your next appointment, please call your pharmacy.    Thank you for choosing [facility] !

## 2017-03-15 NOTE — Progress Notes (Signed)
Cardiology Office Note  Date: 03/16/2017   ID: [name] [name], [name] [name] 11-Jul-1941, MRN [id number]  PCP: [name], MD  Primary Cardiologist: Rozann Lesches, MD   Chief Complaint  Patient presents with  . Cardiac follow-up    History of Present Illness: [name] [name] is a 76 y.o. female last seen in February. She presents today for a follow-up visit. States that overall she has been doing well. She continues to exercise at the senior center most days a week, also walks outdoors. She does not report any angina symptoms or unusual shortness of breath. She states she had one episode where she felt tingling around her lips when exercising one morning after not eating anything. She was told by her PCP that her heart rate was "low." She has not had any dizziness or syncope. I reviewed her home blood pressure monitor which shows heart rates generally in the 60s, some high 50s. She is not on any AV nodal blockers.  I personally reviewed her ECG today which shows sinus rhythm with prolonged PR interval, lead motion artifact, low voltage in the precordial leads, and nonspecific T-wave changes.  Current cardiac medications are stable and outlined below. She reports compliance. Systolic blood pressure has been 120s to 140s at home.  Past Medical History:  Diagnosis Date  . CAD (coronary artery disease)    Multivessel, PTCA of posterior descending followed by CABG January 2017  . Essential hypertension   . ST elevation myocardial infarction (STEMI) of inferior wall [facility])    January 2017  . Wound infection after surgery    Sternal wound infection requiring debridement, pectoralis muscle flap repair, wound [facility] February 2017    Past Surgical History:  Procedure Laterality Date  . ABDOMINAL HYSTERECTOMY    . APPENDECTOMY    . APPLICATION OF WOUND VAC N/A 09/04/2015   Procedure: WOUND VAC CHANGE;  Surgeon: [name], MD;  Location: [place];  Service: Thoracic;  Laterality: N/A;  . BACK SURGERY     . CARDIAC CATHETERIZATION N/A 08/10/2015   Procedure: Left Heart Cath and Coronary Angiography;  Surgeon: [name], MD;  Location: [place] CV LAB;  Service: Cardiovascular;  Laterality: N/A;  . CARDIAC CATHETERIZATION  08/10/2015   Procedure: Coronary Balloon Angioplasty;  Surgeon: [name], MD;  Location: [place] CV LAB;  Service: Cardiovascular;;  . CHEST TUBE INSERTION Left 09/08/2015   Procedure: CHEST TUBE INSERTION;  Surgeon: [name], MD;  Location: [place];  Service: Thoracic;  Laterality: Left;  . COLONOSCOPY N/A 03/29/2013   Procedure: COLONOSCOPY;  Surgeon: Rogene Houston, MD;  [facility];  Service: Endoscopy;  Laterality: N/A;  930  . CORONARY ARTERY BYPASS GRAFT N/A 08/14/2015   Procedure: CORONARY ARTERY BYPASS GRAFTING (CABG)X3 LIMA-LAD; SVG-RAMUS; SVG-PD TRANSESOPHAGEAL ECHOCARDIOGRAM (TEE);  Surgeon: [name], MD;  Location: [place];  Service: Open Heart Surgery;  Laterality: N/A;  . PECTORALIS FLAP N/A 09/08/2015   Procedure: Muscle FLAP;  Surgeon: [name], DO;  Location: [place];  Service: Plastics;  Laterality: N/A;  . STERNAL WOUND DEBRIDEMENT N/A 09/01/2015   Procedure: STERNAL WOUND DEBRIDEMENT;  Surgeon: [name], MD;  Location: [place];  Service: Thoracic;  Laterality: N/A;  . STERNAL WOUND DEBRIDEMENT N/A 09/04/2015   Procedure: STERNAL WOUND DEBRIDEMENT;  Surgeon: [name], MD;  Location: [place];  Service: Thoracic;  Laterality: N/A;  . STERNAL WOUND DEBRIDEMENT N/A 09/08/2015   Procedure: STERNAL WOUND  DEBRIDEMENT;  Surgeon: [name], MD;  Location: [place];  Service: Thoracic;  Laterality: N/A;  . TEE WITHOUT CARDIOVERSION N/A 08/14/2015   Procedure: TRANSESOPHAGEAL ECHOCARDIOGRAM (TEE);  Surgeon: [name], MD;  Location: [place];  Service: Open Heart Surgery;  Laterality: N/A;    Current Outpatient Prescriptions  Medication Sig Dispense Refill  . amLODipine (NORVASC) 2.5 MG tablet Take 2.5  mg by mouth at bedtime.     Marland Kitchen aspirin EC 81 MG tablet Take 81 mg by mouth daily.    Marland Kitchen atorvastatin (LIPITOR) 40 MG tablet Take 1 tablet (40 mg total) by mouth daily. 90 tablet 3  . docusate sodium (DOCU SOFT) 100 MG capsule Take 100 mg by mouth 2 (two) times daily.    Marland Kitchen losartan-hydrochlorothiazide (HYZAAR) 100-25 MG tablet Take 1 tablet by mouth daily.     . Metoprolol Tartrate 75 MG TABS TAKE 1 TABLET BY MOUTH TWICE DAILY 60 tablet 6  . Multiple Vitamin (MULTIVITAMIN) tablet Take 1 tablet by mouth daily.     No current facility-administered medications for this visit.    Allergies:  Other and Peanut-containing drug products   Social History: The patient  reports that she has never smoked. She has never used smokeless tobacco. She reports that she does not drink alcohol or use drugs.   ROS:  Please see the history of present illness. Otherwise, complete review of systems is positive for intermittent gas.  All other systems are reviewed and negative.   Physical Exam: VS:  BP 128/70 (BP Location: Right Arm)   Pulse 70   Ht 5\' 10"  (1.778 m)   Wt 167 lb (75.8 kg)   SpO2 98%   BMI 23.96 kg/m , BMI Body mass index is 23.96 kg/m.  Wt Readings from Last 3 Encounters:  03/16/17 167 lb (75.8 kg)  09/28/16 161 lb (73 kg)  04/01/16 155 lb (70.3 kg)    General: Elderly woman, appears comfortable at rest. HEENT: Conjunctiva and lids normal, oropharynx clear. Neck: Supple, no elevated JVP or carotid bruits, no thyromegaly. Thorax: Well-healed sternal incision, no erythema or drainage. Lungs: Clear to auscultation, nonlabored breathing at rest. Cardiac: Regular rate and rhythm, no S3 or significant systolic murmur, no pericardial rub. Abdomen: Soft, nontender, bowel sounds present, no guarding or rebound. Extremities: No pitting edema, distal pulses 2+. Skin: Warm and dry. Musculoskeletal: No kyphosis. Neuropsychiatric: Alert and oriented x3, affect grossly appropriate.  ECG: I personally  reviewed the tracing from 09/28/2008 when showed sinus rhythm with prolonged PR interval, borderline low voltage, nonspecific ST-T wave abnormalities.  Recent Labwork:    Component Value Date/Time   CHOL 157 08/11/2015 0611   TRIG 73 08/11/2015 0611   HDL 51 08/11/2015 0611   CHOLHDL 3.1 08/11/2015 0611   VLDL 15 08/11/2015 0611   LDLCALC 91 08/11/2015 0611  September 2017: BUN 14, creatinine 0.75, potassium 4.0, cholesterol 93, triglycerides 30, HDL 53, LDL 34  Other Studies Reviewed Today:  Echocardiogram 08/11/2015: Study Conclusions  - Left ventricle: The cavity size was normal. Wall thickness was   increased in a pattern of mild LVH. Systolic function was normal.   The estimated ejection fraction was in the range of 50% to 55%.   Probable moderate hypokinesis of the basal-midinferior   myocardium. Doppler parameters are consistent with abnormal left   ventricular relaxation (grade 1 diastolic dysfunction). - Aortic valve: There was mild regurgitation. - Pericardium, extracardiac: A trivial pericardial effusion was   identified posterior to the  heart.  Assessment and Plan:  1. Symptomatically stable CAD status post CABG in January 2017. She is exercising regularly and generally feels well on current medical therapy.  2. Intermittent bradycardia, not clearly symptomatic. She likely has some conduction system disease at age 51, also prolonged PR interval by ECG, but is not on any AV nodal blockers. Continue observation for now. If she develops episodes of lightheadedness or syncope, or heart rate trends slower, can consider further cardiac monitoring.  3. Essential hypertension, blood pressure control looks to be adequate on current medical therapy. No changes were made today.  4. Hyperlipidemia, continue statin therapy.  Current medicines were reviewed with the patient today.   Orders Placed This Encounter  Procedures  . EKG 12-Lead    Disposition: Follow-up in 6  months.  Signed, Satira Sark, MD, [facility] 03/16/2017 8:59 AM    [facility] at [facility] [address] [place], [place] [address] Phone: [phone]; Fax: [phone]

## 2017-03-16 ENCOUNTER — Encounter: Payer: Self-pay | Admitting: Cardiology

## 2017-03-16 ENCOUNTER — Ambulatory Visit (INDEPENDENT_AMBULATORY_CARE_PROVIDER_SITE_OTHER): Payer: Medicare PPO | Admitting: Cardiology

## 2017-03-16 VITALS — BP 128/70 | HR 70 | Ht 70.0 in | Wt 167.0 lb

## 2017-03-16 DIAGNOSIS — R001 Bradycardia, unspecified: Secondary | ICD-10-CM | POA: Diagnosis not present

## 2017-03-16 DIAGNOSIS — E782 Mixed hyperlipidemia: Secondary | ICD-10-CM

## 2017-03-16 DIAGNOSIS — I2510 Atherosclerotic heart disease of native coronary artery without angina pectoris: Secondary | ICD-10-CM | POA: Diagnosis not present

## 2017-03-16 DIAGNOSIS — I10 Essential (primary) hypertension: Secondary | ICD-10-CM | POA: Diagnosis not present

## 2017-03-16 NOTE — Patient Instructions (Addendum)
Your physician wants you to follow-up in: 6 months with Dr McDowell You will receive a reminder letter in the mail two months in advance. If you don't receive a letter, please call our office to schedule the follow-up appointment.     Your physician recommends that you continue on your current medications as directed. Please refer to the Current Medication list given to you today.    If you need a refill on your cardiac medications before your next appointment, please call your pharmacy.     Thank you for choosing [facility] !        

## 2017-04-06 ENCOUNTER — Ambulatory Visit: Payer: Medicare PPO | Admitting: Cardiology

## 2017-04-19 ENCOUNTER — Telehealth: Payer: Self-pay | Admitting: Cardiology

## 2017-04-19 ENCOUNTER — Other Ambulatory Visit: Payer: Self-pay | Admitting: *Deleted

## 2017-04-19 MED ORDER — METOPROLOL TARTRATE 50 MG PO TABS: 75.0000 mg | ORAL_TABLET | Freq: Two times a day (BID) | ORAL | 3 refills | Status: DC

## 2017-04-19 NOTE — Telephone Encounter (Signed)
Refill complete 

## 2017-04-19 NOTE — Telephone Encounter (Signed)
°*  STAT* If patient is at the pharmacy, call can be transferred to refill team.   1. Which medications need to be refilled? (please list name of each medication and dose if known)  Metoprolol Tartrate 75 MG TABS [[id number]]    2. Which pharmacy/location (including street and city if local pharmacy) is medication to be sent to? [facility]  3. Do they need a 30 day or 90 day supply?  62  Pt went to pick up Rx from [facility] and they had only given her 10 pills and told her they were going to try and order more, she's not sure if they are out or if something is wrong w/ her Rx

## 2017-07-06 DIAGNOSIS — R7303 Prediabetes: Secondary | ICD-10-CM | POA: Diagnosis not present

## 2017-07-06 DIAGNOSIS — R350 Frequency of micturition: Secondary | ICD-10-CM | POA: Diagnosis not present

## 2017-07-06 DIAGNOSIS — R6889 Other general symptoms and signs: Secondary | ICD-10-CM | POA: Diagnosis not present

## 2017-07-06 DIAGNOSIS — E559 Vitamin D deficiency, unspecified: Secondary | ICD-10-CM | POA: Diagnosis not present

## 2017-07-06 DIAGNOSIS — I10 Essential (primary) hypertension: Secondary | ICD-10-CM | POA: Diagnosis not present

## 2017-07-06 DIAGNOSIS — Z131 Encounter for screening for diabetes mellitus: Secondary | ICD-10-CM | POA: Diagnosis not present

## 2017-07-06 DIAGNOSIS — M8580 Other specified disorders of bone density and structure, unspecified site: Secondary | ICD-10-CM | POA: Diagnosis not present

## 2017-07-08 DIAGNOSIS — I10 Essential (primary) hypertension: Secondary | ICD-10-CM | POA: Diagnosis not present

## 2017-07-08 DIAGNOSIS — E785 Hyperlipidemia, unspecified: Secondary | ICD-10-CM | POA: Diagnosis not present

## 2017-07-08 DIAGNOSIS — Z0001 Encounter for general adult medical examination with abnormal findings: Secondary | ICD-10-CM | POA: Diagnosis not present

## 2017-07-08 DIAGNOSIS — I2510 Atherosclerotic heart disease of native coronary artery without angina pectoris: Secondary | ICD-10-CM | POA: Diagnosis not present

## 2017-07-11 DIAGNOSIS — Z23 Encounter for immunization: Secondary | ICD-10-CM | POA: Diagnosis not present

## 2017-07-12 ENCOUNTER — Other Ambulatory Visit (HOSPITAL_COMMUNITY): Payer: Self-pay | Admitting: Internal Medicine

## 2017-07-12 DIAGNOSIS — Z1231 Encounter for screening mammogram for malignant neoplasm of breast: Secondary | ICD-10-CM

## 2017-07-15 ENCOUNTER — Telehealth: Payer: Self-pay | Admitting: Cardiology

## 2017-07-15 NOTE — Telephone Encounter (Signed)
Pt has a question concerning her metoprolol tartrate (LOPRESSOR) 50 MG tablet [[id number]] and amLODipine (NORVASC) 2.5 MG tablet [[id number]]

## 2017-07-15 NOTE — Telephone Encounter (Signed)
Attempt to reach, got VM-cc

## 2017-07-20 NOTE — Telephone Encounter (Signed)
Spoke with pt, she questioned her dose of lopressor. I clarified it. She voiced understanding.

## 2017-07-25 ENCOUNTER — Ambulatory Visit (HOSPITAL_COMMUNITY)
Admission: RE | Admit: 2017-07-25 | Discharge: 2017-07-25 | Disposition: A | Discharge status: Home / Self Care | Payer: Medicare PPO | Source: Ambulatory Visit | Attending: Internal Medicine | Admitting: Internal Medicine

## 2017-07-25 DIAGNOSIS — Z1231 Encounter for screening mammogram for malignant neoplasm of breast: Secondary | ICD-10-CM | POA: Diagnosis not present

## 2017-08-17 ENCOUNTER — Other Ambulatory Visit: Payer: Self-pay

## 2017-08-17 ENCOUNTER — Encounter (HOSPITAL_COMMUNITY): Payer: Self-pay

## 2017-08-17 ENCOUNTER — Emergency Department (HOSPITAL_COMMUNITY)
Admission: EM | Admit: 2017-08-17 | Discharge: 2017-08-17 | Disposition: A | Discharge status: Home / Self Care | Payer: Medicare PPO | Attending: Emergency Medicine | Admitting: Emergency Medicine

## 2017-08-17 DIAGNOSIS — I2510 Atherosclerotic heart disease of native coronary artery without angina pectoris: Secondary | ICD-10-CM | POA: Diagnosis not present

## 2017-08-17 DIAGNOSIS — Z7982 Long term (current) use of aspirin: Secondary | ICD-10-CM | POA: Insufficient documentation

## 2017-08-17 DIAGNOSIS — M436 Torticollis: Secondary | ICD-10-CM | POA: Insufficient documentation

## 2017-08-17 DIAGNOSIS — Z951 Presence of aortocoronary bypass graft: Secondary | ICD-10-CM | POA: Diagnosis not present

## 2017-08-17 DIAGNOSIS — Z91010 Allergy to peanuts: Secondary | ICD-10-CM | POA: Diagnosis not present

## 2017-08-17 DIAGNOSIS — Z79899 Other long term (current) drug therapy: Secondary | ICD-10-CM | POA: Diagnosis not present

## 2017-08-17 DIAGNOSIS — M542 Cervicalgia: Secondary | ICD-10-CM | POA: Diagnosis present

## 2017-08-17 DIAGNOSIS — I10 Essential (primary) hypertension: Secondary | ICD-10-CM | POA: Insufficient documentation

## 2017-08-17 MED ORDER — HYDROCODONE-ACETAMINOPHEN 5-325 MG PO TABS: 1.0000 | ORAL_TABLET | ORAL | 0 refills | Status: DC | PRN

## 2017-08-17 MED ORDER — DIAZEPAM 5 MG PO TABS: 5.0000 mg | ORAL_TABLET | Freq: Three times a day (TID) | ORAL | 0 refills | Status: DC | PRN

## 2017-08-17 MED ORDER — DEXAMETHASONE 4 MG PO TABS
8.0000 mg | ORAL_TABLET | Freq: Once | ORAL | Status: AC
  Administered 2017-08-17: 8 mg via ORAL
  Filled 2017-08-17: qty 2

## 2017-08-17 MED ORDER — OXYCODONE-ACETAMINOPHEN 5-325 MG PO TABS
1.0000 | ORAL_TABLET | Freq: Once | ORAL | Status: AC
  Administered 2017-08-17: 1 via ORAL
  Filled 2017-08-17: qty 1

## 2017-08-17 MED ORDER — DIAZEPAM 5 MG PO TABS
5.0000 mg | ORAL_TABLET | Freq: Once | ORAL | Status: AC
  Administered 2017-08-17: 5 mg via ORAL
  Filled 2017-08-17: qty 1

## 2017-08-17 NOTE — Discharge Instructions (Signed)
Continue to take ibuprofen as needed for pain. Use prescriptions as needed if ibuprofen is not sufficient.

## 2017-08-17 NOTE — ED Triage Notes (Signed)
Patient reports of stiffness in neck since Sunday. States pain starts in right shoulder and radiates into right base of skull. Increased pain when turning head to the right. Denies chest pain.  Hx of pain in shoulder due to Pectoral muscle moved during surgery.

## 2017-08-18 NOTE — ED Provider Notes (Signed)
[facility] EMERGENCY DEPARTMENT Provider Note   CSN: [id number] Arrival date & time: 08/17/17  1353     History   Chief Complaint Chief Complaint  Patient presents with  . Torticollis    HPI [name] [name] is a 77 y.o. female.  HPI   77 year old female with neck pain.  Gradual onset on Sunday.  Persistent since then.  Pain radiates from the base of her skull right behind her right ear and comes to the side of her neck towards her collarbone.  Pain is worse with rotation and flexion.  Feels better when she is still.  It does not radiate.  No acute numbness or tingling.  Has tried ibuprofen with only mild improvement.  Past Medical History:  Diagnosis Date  . CAD (coronary artery disease)    Multivessel, PTCA of posterior descending followed by CABG January 2017  . Essential hypertension   . ST elevation myocardial infarction (STEMI) of inferior wall [facility])    January 2017  . Wound infection after surgery    Sternal wound infection requiring debridement, pectoralis muscle flap repair, wound [facility] February 2017    Patient Active Problem List   Diagnosis Date Noted  . Wound infection after surgery 09/01/2015  . 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 Sternal wound dehiscence 08/31/2015  . S/P CABG x 3 08/14/2015  . CAD (coronary artery disease), native coronary artery 08/11/2015  . CAD (coronary artery disease)   . Acute inferior myocardial infarction ([place]) 08/10/2015  . Acute MI, inferior wall, initial episode of care ([place])   . Essential hypertension 02/19/2015    Past Surgical History:  Procedure Laterality Date  . ABDOMINAL HYSTERECTOMY    . APPENDECTOMY    . APPLICATION OF WOUND VAC N/A 09/04/2015   Procedure: WOUND VAC CHANGE;  Surgeon: [name], MD;  Location: [place];  Service: Thoracic;  Laterality: N/A;  . BACK SURGERY    . CARDIAC CATHETERIZATION N/A 08/10/2015   Procedure: Left Heart Cath and Coronary Angiography;  Surgeon: [name], MD;   Location: [place] CV LAB;  Service: Cardiovascular;  Laterality: N/A;  . CARDIAC CATHETERIZATION  08/10/2015   Procedure: Coronary Balloon Angioplasty;  Surgeon: [name], MD;  Location: [place] CV LAB;  Service: Cardiovascular;;  . CHEST TUBE INSERTION Left 09/08/2015   Procedure: CHEST TUBE INSERTION;  Surgeon: [name], MD;  Location: [place];  Service: Thoracic;  Laterality: Left;  . COLONOSCOPY N/A 03/29/2013   Procedure: COLONOSCOPY;  Surgeon: Rogene Houston, MD;  [facility];  Service: Endoscopy;  Laterality: N/A;  930  . CORONARY ARTERY BYPASS GRAFT N/A 08/14/2015   Procedure: CORONARY ARTERY BYPASS GRAFTING (CABG)X3 LIMA-LAD; SVG-RAMUS; SVG-PD TRANSESOPHAGEAL ECHOCARDIOGRAM (TEE);  Surgeon: [name], MD;  Location: [place];  Service: Open Heart Surgery;  Laterality: N/A;  . PECTORALIS FLAP N/A 09/08/2015   Procedure: Muscle FLAP;  Surgeon: [name], DO;  Location: [place];  Service: Plastics;  Laterality: N/A;  . STERNAL WOUND DEBRIDEMENT N/A 09/01/2015   Procedure: STERNAL WOUND DEBRIDEMENT;  Surgeon: [name], MD;  Location: [place];  Service: Thoracic;  Laterality: N/A;  . STERNAL WOUND DEBRIDEMENT N/A 09/04/2015   Procedure: STERNAL WOUND DEBRIDEMENT;  Surgeon: [name], MD;  Location: [place];  Service: Thoracic;  Laterality: N/A;  . STERNAL WOUND DEBRIDEMENT N/A 09/08/2015   Procedure: STERNAL WOUND DEBRIDEMENT;  Surgeon: [name], MD;  Location: [place];  Service: Thoracic;  Laterality: N/A;  . TEE WITHOUT CARDIOVERSION N/A 08/14/2015   Procedure: TRANSESOPHAGEAL ECHOCARDIOGRAM (TEE);  Surgeon: [name], MD;  Location: [place];  Service: Open Heart Surgery;  Laterality: N/A;    OB History    No data available       Home Medications    Prior to Admission medications   Medication Sig Start Date End Date Taking? Authorizing Provider  amLODipine (NORVASC) 2.5 MG tablet Take 2.5 mg by mouth at bedtime.  09/07/16    [provider]  aspirin EC 81 MG tablet Take 81 mg by mouth daily.    [provider]  atorvastatin (LIPITOR) 40 MG tablet Take 1 tablet (40 mg total) by mouth daily. 04/15/16 03/16/17  Satira Sark, MD  diazepam (VALIUM) 5 MG tablet Take 1 tablet (5 mg total) by mouth every 8 (eight) hours as needed for muscle spasms. 08/17/17   Virgel Manifold, MD  docusate sodium (DOCU SOFT) 100 MG capsule Take 100 mg by mouth 2 (two) times daily.    [provider]  HYDROcodone-acetaminophen (NORCO/VICODIN) 5-325 MG tablet Take 1 tablet by mouth every 4 (four) hours as needed. 08/17/17   Virgel Manifold, MD  losartan-hydrochlorothiazide (HYZAAR) 100-25 MG tablet Take 1 tablet by mouth daily.  03/05/16   [provider]  metoprolol tartrate (LOPRESSOR) 50 MG tablet Take 1.5 tablets (75 mg total) by mouth 2 (two) times daily. 04/19/17 07/18/17  Satira Sark, MD  Multiple Vitamin (MULTIVITAMIN) tablet Take 1 tablet by mouth daily.    [provider]    Family History Family History  Problem Relation Age of Onset  . Colon cancer Neg Hx     Social History Social History   Tobacco Use  . Smoking status: Never Smoker  . Smokeless tobacco: Never Used  Substance Use Topics  . Alcohol use: No    Alcohol/week: 0.0 oz  . Drug use: No     Allergies   Other and Peanut-containing drug products   Review of Systems Review of Systems   Physical Exam Updated Vital Signs BP (!) 174/85 (BP Location: Left Arm)   Pulse (!) 57   Temp 98.7 F (37.1 C) (Oral)   Resp 16   Ht 5\' 10"  (1.778 m)   Wt 74.8 kg (165 lb)   SpO2 100%   BMI 23.68 kg/m   Physical Exam  Constitutional: She appears well-developed and well-nourished. No distress.  HENT:  Head: Normocephalic and atraumatic.  Patient holding head mildly flexed and rotated.  She has tenderness/spasm along the right sternocleidomastoid.  No concerning lesions noted.  Eyes: Conjunctivae are normal.  Right eye exhibits no discharge. Left eye exhibits no discharge.  Neck: Neck supple.  Cardiovascular: Normal rate, regular rhythm and normal heart sounds. Exam reveals no gallop and no friction rub.  No murmur heard. Pulmonary/Chest: Effort normal and breath sounds normal. No respiratory distress.  Abdominal: Soft. She exhibits no distension. There is no tenderness.  Musculoskeletal: She exhibits no edema or tenderness.  Neurological: She is alert.  Strength is 5 out of 5 bilateral upper extremities.  Sensation is intact to light touch.  Skin: Skin is warm and dry.  Psychiatric: She has a normal mood and affect. Her behavior is normal. Thought content normal.  Nursing note and vitals reviewed.    ED Treatments / Results  Labs (all labs ordered are listed, but only abnormal results are displayed) Labs Reviewed - No data to display  EKG  EKG Interpretation  None       Radiology No results found.  Procedures Procedures (including critical care time)  Medications Ordered in ED Medications  diazepam (VALIUM) tablet 5 mg (5 mg Oral Given 08/17/17 1707)  oxyCODONE-acetaminophen (PERCOCET/ROXICET) 5-325 MG per tablet 1 tablet (1 tablet Oral Given 08/17/17 1705)  dexamethasone (DECADRON) tablet 8 mg (8 mg Oral Given 08/17/17 1706)     Initial Impression / Assessment and Plan / ED Course  I have reviewed the triage vital signs and the nursing notes.  Pertinent labs & imaging results that were available during my care of the patient were reviewed by me and considered in my medical decision making (see chart for details).     77 year old female with atraumatic right neck pain.  Significant tenderness and spasm in the right sternocleidomastoid.  Nonfocal neurological examination.  Plan symptomatic treatment.  Final Clinical Impressions(s) / ED Diagnoses   Final diagnoses:  Torticollis    ED Discharge Orders        Ordered    HYDROcodone-acetaminophen (NORCO/VICODIN) 5-325 MG  tablet  Every 4 hours PRN     08/17/17 1648    diazepam (VALIUM) 5 MG tablet  Every 8 hours PRN     08/17/17 1648       Virgel Manifold, MD 08/18/17 1704

## 2017-08-22 ENCOUNTER — Other Ambulatory Visit (HOSPITAL_COMMUNITY): Payer: Self-pay | Admitting: Family Medicine

## 2017-08-22 ENCOUNTER — Ambulatory Visit (HOSPITAL_COMMUNITY)
Admission: RE | Admit: 2017-08-22 | Discharge: 2017-08-22 | Disposition: A | Discharge status: Home / Self Care | Payer: Medicare PPO | Source: Ambulatory Visit | Attending: Family Medicine | Admitting: Family Medicine

## 2017-08-22 DIAGNOSIS — Z951 Presence of aortocoronary bypass graft: Secondary | ICD-10-CM | POA: Insufficient documentation

## 2017-08-22 DIAGNOSIS — Z79899 Other long term (current) drug therapy: Secondary | ICD-10-CM | POA: Diagnosis not present

## 2017-08-22 DIAGNOSIS — M542 Cervicalgia: Secondary | ICD-10-CM | POA: Insufficient documentation

## 2017-08-22 DIAGNOSIS — M47812 Spondylosis without myelopathy or radiculopathy, cervical region: Secondary | ICD-10-CM | POA: Diagnosis not present

## 2017-08-22 DIAGNOSIS — I10 Essential (primary) hypertension: Secondary | ICD-10-CM | POA: Diagnosis not present

## 2017-08-29 DIAGNOSIS — M542 Cervicalgia: Secondary | ICD-10-CM | POA: Diagnosis not present

## 2017-08-29 DIAGNOSIS — M799 Soft tissue disorder, unspecified: Secondary | ICD-10-CM | POA: Diagnosis not present

## 2017-08-29 DIAGNOSIS — M25511 Pain in right shoulder: Secondary | ICD-10-CM | POA: Diagnosis not present

## 2017-08-29 DIAGNOSIS — M2560 Stiffness of unspecified joint, not elsewhere classified: Secondary | ICD-10-CM | POA: Diagnosis not present

## 2017-08-30 DIAGNOSIS — M542 Cervicalgia: Secondary | ICD-10-CM | POA: Diagnosis not present

## 2017-08-30 DIAGNOSIS — M2560 Stiffness of unspecified joint, not elsewhere classified: Secondary | ICD-10-CM | POA: Diagnosis not present

## 2017-08-30 DIAGNOSIS — M799 Soft tissue disorder, unspecified: Secondary | ICD-10-CM | POA: Diagnosis not present

## 2017-08-30 DIAGNOSIS — M25511 Pain in right shoulder: Secondary | ICD-10-CM | POA: Diagnosis not present

## 2017-09-02 DIAGNOSIS — M25511 Pain in right shoulder: Secondary | ICD-10-CM | POA: Diagnosis not present

## 2017-09-02 DIAGNOSIS — M542 Cervicalgia: Secondary | ICD-10-CM | POA: Diagnosis not present

## 2017-09-02 DIAGNOSIS — M2560 Stiffness of unspecified joint, not elsewhere classified: Secondary | ICD-10-CM | POA: Diagnosis not present

## 2017-09-02 DIAGNOSIS — M799 Soft tissue disorder, unspecified: Secondary | ICD-10-CM | POA: Diagnosis not present

## 2017-09-05 DIAGNOSIS — M2560 Stiffness of unspecified joint, not elsewhere classified: Secondary | ICD-10-CM | POA: Diagnosis not present

## 2017-09-05 DIAGNOSIS — J449 Chronic obstructive pulmonary disease, unspecified: Secondary | ICD-10-CM | POA: Diagnosis not present

## 2017-09-05 DIAGNOSIS — Z79899 Other long term (current) drug therapy: Secondary | ICD-10-CM | POA: Diagnosis not present

## 2017-09-05 DIAGNOSIS — M5030 Other cervical disc degeneration, unspecified cervical region: Secondary | ICD-10-CM | POA: Diagnosis not present

## 2017-09-05 DIAGNOSIS — M2578 Osteophyte, vertebrae: Secondary | ICD-10-CM | POA: Diagnosis not present

## 2017-09-05 DIAGNOSIS — M799 Soft tissue disorder, unspecified: Secondary | ICD-10-CM | POA: Diagnosis not present

## 2017-09-05 DIAGNOSIS — I10 Essential (primary) hypertension: Secondary | ICD-10-CM | POA: Diagnosis not present

## 2017-09-05 DIAGNOSIS — M25511 Pain in right shoulder: Secondary | ICD-10-CM | POA: Diagnosis not present

## 2017-09-05 DIAGNOSIS — M542 Cervicalgia: Secondary | ICD-10-CM | POA: Diagnosis not present

## 2017-09-06 DIAGNOSIS — M542 Cervicalgia: Secondary | ICD-10-CM | POA: Diagnosis not present

## 2017-09-06 DIAGNOSIS — M25511 Pain in right shoulder: Secondary | ICD-10-CM | POA: Diagnosis not present

## 2017-09-06 DIAGNOSIS — M2560 Stiffness of unspecified joint, not elsewhere classified: Secondary | ICD-10-CM | POA: Diagnosis not present

## 2017-09-06 DIAGNOSIS — M799 Soft tissue disorder, unspecified: Secondary | ICD-10-CM | POA: Diagnosis not present

## 2017-09-09 NOTE — Progress Notes (Signed)
Cardiology Office Note  Date: 09/13/2017   ID: Nevia, Henkin April 09, 1941, MRN [id number]  PCP: [name], MD  Primary Cardiologist: Rozann Lesches, MD   Chief Complaint  Patient presents with  . Coronary Artery Disease    History of Present Illness: [name] [name] is a 77 y.o. female last seen in August 2018.  She presents for a routine follow-up visit.  Since last encounter she does not report any angina symptoms or increasing shortness of breath.  She states that she is undergoing physical therapy related to neck muscle stiffness.  Plans to resume regular exercise at the senior center in the next few weeks.  I reviewed her medications.  Cardiac regimen includes aspirin, Norvasc, Lipitor, Hyzaar, and Lopressor.  She reports having interval lab work with Dr. Maudie Mercury, we are requesting the results for review.  Past Medical History:  Diagnosis Date  . CAD (coronary artery disease)    Multivessel, PTCA of posterior descending followed by CABG January 2017  . Essential hypertension   . ST elevation myocardial infarction (STEMI) of inferior wall [facility])    January 2017  . Wound infection after surgery    Sternal wound infection requiring debridement, pectoralis muscle flap repair, wound [facility] February 2017    Past Surgical History:  Procedure Laterality Date  . ABDOMINAL HYSTERECTOMY    . APPENDECTOMY    . APPLICATION OF WOUND VAC N/A 09/04/2015   Procedure: WOUND VAC CHANGE;  Surgeon: [name], MD;  Location: [place];  Service: Thoracic;  Laterality: N/A;  . BACK SURGERY    . CARDIAC CATHETERIZATION N/A 08/10/2015   Procedure: Left Heart Cath and Coronary Angiography;  Surgeon: [name], MD;  Location: [place] CV LAB;  Service: Cardiovascular;  Laterality: N/A;  . CARDIAC CATHETERIZATION  08/10/2015   Procedure: Coronary Balloon Angioplasty;  Surgeon: [name], MD;  Location: [place] CV LAB;  Service: Cardiovascular;;  . CHEST TUBE INSERTION  Left 09/08/2015   Procedure: CHEST TUBE INSERTION;  Surgeon: [name], MD;  Location: [place];  Service: Thoracic;  Laterality: Left;  . COLONOSCOPY N/A 03/29/2013   Procedure: COLONOSCOPY;  Surgeon: Rogene Houston, MD;  [facility];  Service: Endoscopy;  Laterality: N/A;  930  . CORONARY ARTERY BYPASS GRAFT N/A 08/14/2015   Procedure: CORONARY ARTERY BYPASS GRAFTING (CABG)X3 LIMA-LAD; SVG-RAMUS; SVG-PD TRANSESOPHAGEAL ECHOCARDIOGRAM (TEE);  Surgeon: [name], MD;  Location: [place];  Service: Open Heart Surgery;  Laterality: N/A;  . PECTORALIS FLAP N/A 09/08/2015   Procedure: Muscle FLAP;  Surgeon: [name], DO;  Location: [place];  Service: Plastics;  Laterality: N/A;  . STERNAL WOUND DEBRIDEMENT N/A 09/01/2015   Procedure: STERNAL WOUND DEBRIDEMENT;  Surgeon: [name], MD;  Location: [place];  Service: Thoracic;  Laterality: N/A;  . STERNAL WOUND DEBRIDEMENT N/A 09/04/2015   Procedure: STERNAL WOUND DEBRIDEMENT;  Surgeon: [name], MD;  Location: [place];  Service: Thoracic;  Laterality: N/A;  . STERNAL WOUND DEBRIDEMENT N/A 09/08/2015   Procedure: STERNAL WOUND DEBRIDEMENT;  Surgeon: [name], MD;  Location: [place];  Service: Thoracic;  Laterality: N/A;  . TEE WITHOUT CARDIOVERSION N/A 08/14/2015   Procedure: TRANSESOPHAGEAL ECHOCARDIOGRAM (TEE);  Surgeon: [name], MD;  Location: [place];  Service: Open Heart Surgery;  Laterality: N/A;    Current Outpatient Medications  Medication Sig Dispense Refill  . amLODipine (NORVASC) 2.5 MG tablet Take 2.5 mg by mouth at  bedtime.     Marland Kitchen aspirin EC 81 MG tablet Take 81 mg by mouth daily.    . diazepam (VALIUM) 5 MG tablet Take 1 tablet (5 mg total) by mouth every 8 (eight) hours as needed for muscle spasms. 10 tablet 0  . docusate sodium (DOCU SOFT) 100 MG capsule Take 100 mg by mouth 2 (two) times daily.    Marland Kitchen HYDROcodone-acetaminophen (NORCO/VICODIN) 5-325 MG tablet Take 1 tablet by mouth every 4 (four)  hours as needed. 10 tablet 0  . losartan-hydrochlorothiazide (HYZAAR) 100-25 MG tablet Take 1 tablet by mouth daily.     . Multiple Vitamin (MULTIVITAMIN) tablet Take 1 tablet by mouth daily.    Marland Kitchen atorvastatin (LIPITOR) 40 MG tablet Take 1 tablet (40 mg total) by mouth daily. 90 tablet 3  . metoprolol tartrate (LOPRESSOR) 50 MG tablet Take 1.5 tablets (75 mg total) by mouth 2 (two) times daily. 270 tablet 3   No current facility-administered medications for this visit.    Allergies:  Other and Peanut-containing drug products   Social History: The patient  reports that  has never smoked. she has never used smokeless tobacco. She reports that she does not drink alcohol or use drugs.   ROS:  Please see the history of present illness. Otherwise, complete review of systems is positive for neck muscle stiffness.  All other systems are reviewed and negative.   Physical Exam: VS:  BP 138/74 (BP Location: Right Arm)   Pulse 64   Ht 5\' 9"  (1.753 m)   Wt 170 lb (77.1 kg)   SpO2 91%   BMI 25.10 kg/m , BMI Body mass index is 25.1 kg/m.  Wt Readings from Last 3 Encounters:  09/13/17 170 lb (77.1 kg)  08/17/17 165 lb (74.8 kg)  03/16/17 167 lb (75.8 kg)    General: Pleasant elderly woman, appears comfortable at rest. HEENT: Conjunctiva and lids normal, oropharynx clear. Neck: Supple, no elevated JVP or carotid bruits, no thyromegaly. Thorax: Well-healed sternal incision. Lungs: Clear to auscultation, nonlabored breathing at rest. Cardiac: Regular rate and rhythm, no S3 or significant systolic murmur, no pericardial rub. Abdomen: Soft, nontender, bowel sounds present, no guarding or rebound. Extremities: No pitting edema, distal pulses 2+. Skin: Warm and dry. Musculoskeletal: No kyphosis. Neuropsychiatric: Alert and oriented x3, affect grossly appropriate.  ECG: I personally reviewed the tracing from 08/17/2017 which showed sinus bradycardia with nonspecific T wave abnormalities.  Recent  Labwork:  September 2017: BUN 14, creatinine 0.75, potassium 4.0, cholesterol 93, triglycerides 30, HDL 53, LDL 34  Other Studies Reviewed Today:  Echocardiogram 08/11/2015: Study Conclusions  - Left ventricle: The cavity size was normal. Wall thickness was increased in a pattern of mild LVH. Systolic function was normal. The estimated ejection fraction was in the range of 50% to 55%. Probable moderate hypokinesis of the basal-midinferior myocardium. Doppler parameters are consistent with abnormal left ventricular relaxation (grade 1 diastolic dysfunction). - Aortic valve: There was mild regurgitation. - Pericardium, extracardiac: A trivial pericardial effusion was identified posterior to the heart.  Assessment and Plan:  1.  Multivessel CAD status post CABG in January 2017.  She reports no angina on medical therapy.  I have recommended continued exercise plan and observation.  2.  Essential hypertension, blood pressure is well controlled today.  Continue with present regimen.  3.  Mixed hyperlipidemia, continues on Lipitor.  We are requesting most recent lipid panel from Dr. Maudie Mercury.  4.  History of bradycardia, currently asymptomatic and not on AV  nodal blockers.  Current medicines were reviewed with the patient today.  Disposition: Follow-up in 1 year.  Signed, Satira Sark, MD, [facility] 09/13/2017 1:58 PM    [place] [facility] [address], [place], [place] [address] Phone: [phone]; Fax: [phone]

## 2017-09-13 ENCOUNTER — Ambulatory Visit: Payer: Medicare PPO | Admitting: Cardiology

## 2017-09-13 ENCOUNTER — Encounter: Payer: Self-pay | Admitting: Cardiology

## 2017-09-13 VITALS — BP 138/74 | HR 64 | Ht 69.0 in | Wt 170.0 lb

## 2017-09-13 DIAGNOSIS — I2510 Atherosclerotic heart disease of native coronary artery without angina pectoris: Secondary | ICD-10-CM | POA: Diagnosis not present

## 2017-09-13 DIAGNOSIS — M25511 Pain in right shoulder: Secondary | ICD-10-CM | POA: Diagnosis not present

## 2017-09-13 DIAGNOSIS — E782 Mixed hyperlipidemia: Secondary | ICD-10-CM | POA: Diagnosis not present

## 2017-09-13 DIAGNOSIS — R001 Bradycardia, unspecified: Secondary | ICD-10-CM | POA: Diagnosis not present

## 2017-09-13 DIAGNOSIS — M542 Cervicalgia: Secondary | ICD-10-CM | POA: Diagnosis not present

## 2017-09-13 DIAGNOSIS — I10 Essential (primary) hypertension: Secondary | ICD-10-CM

## 2017-09-13 DIAGNOSIS — M2560 Stiffness of unspecified joint, not elsewhere classified: Secondary | ICD-10-CM | POA: Diagnosis not present

## 2017-09-13 DIAGNOSIS — M799 Soft tissue disorder, unspecified: Secondary | ICD-10-CM | POA: Diagnosis not present

## 2017-09-13 NOTE — Patient Instructions (Signed)
Your physician wants you to follow-up in: 1 year with Dr.McDowell You will receive a reminder letter in the mail two months in advance. If you don't receive a letter, please call our office to schedule the follow-up appointment.    Your physician recommends that you continue on your current medications as directed. Please refer to the Current Medication list given to you today.    If you need a refill on your cardiac medications before your next appointment, please call your pharmacy.     No lab work or tests ordered today.      Thank you for choosing [facility] !        

## 2017-09-16 DIAGNOSIS — M2560 Stiffness of unspecified joint, not elsewhere classified: Secondary | ICD-10-CM | POA: Diagnosis not present

## 2017-09-16 DIAGNOSIS — M25511 Pain in right shoulder: Secondary | ICD-10-CM | POA: Diagnosis not present

## 2017-09-16 DIAGNOSIS — M799 Soft tissue disorder, unspecified: Secondary | ICD-10-CM | POA: Diagnosis not present

## 2017-09-16 DIAGNOSIS — M542 Cervicalgia: Secondary | ICD-10-CM | POA: Diagnosis not present

## 2017-09-20 DIAGNOSIS — M799 Soft tissue disorder, unspecified: Secondary | ICD-10-CM | POA: Diagnosis not present

## 2017-09-20 DIAGNOSIS — M25511 Pain in right shoulder: Secondary | ICD-10-CM | POA: Diagnosis not present

## 2017-09-20 DIAGNOSIS — M2560 Stiffness of unspecified joint, not elsewhere classified: Secondary | ICD-10-CM | POA: Diagnosis not present

## 2017-09-20 DIAGNOSIS — M542 Cervicalgia: Secondary | ICD-10-CM | POA: Diagnosis not present

## 2017-09-22 DIAGNOSIS — M799 Soft tissue disorder, unspecified: Secondary | ICD-10-CM | POA: Diagnosis not present

## 2017-09-22 DIAGNOSIS — M25511 Pain in right shoulder: Secondary | ICD-10-CM | POA: Diagnosis not present

## 2017-09-22 DIAGNOSIS — M542 Cervicalgia: Secondary | ICD-10-CM | POA: Diagnosis not present

## 2017-09-22 DIAGNOSIS — M2560 Stiffness of unspecified joint, not elsewhere classified: Secondary | ICD-10-CM | POA: Diagnosis not present

## 2017-09-27 DIAGNOSIS — M542 Cervicalgia: Secondary | ICD-10-CM | POA: Diagnosis not present

## 2017-09-27 DIAGNOSIS — M25511 Pain in right shoulder: Secondary | ICD-10-CM | POA: Diagnosis not present

## 2017-09-27 DIAGNOSIS — M799 Soft tissue disorder, unspecified: Secondary | ICD-10-CM | POA: Diagnosis not present

## 2017-09-27 DIAGNOSIS — M2560 Stiffness of unspecified joint, not elsewhere classified: Secondary | ICD-10-CM | POA: Diagnosis not present

## 2017-10-03 DIAGNOSIS — M25511 Pain in right shoulder: Secondary | ICD-10-CM | POA: Diagnosis not present

## 2017-10-03 DIAGNOSIS — M2560 Stiffness of unspecified joint, not elsewhere classified: Secondary | ICD-10-CM | POA: Diagnosis not present

## 2017-10-03 DIAGNOSIS — M799 Soft tissue disorder, unspecified: Secondary | ICD-10-CM | POA: Diagnosis not present

## 2017-10-03 DIAGNOSIS — M542 Cervicalgia: Secondary | ICD-10-CM | POA: Diagnosis not present

## 2017-10-11 DIAGNOSIS — I10 Essential (primary) hypertension: Secondary | ICD-10-CM | POA: Diagnosis not present

## 2017-10-11 DIAGNOSIS — Z79899 Other long term (current) drug therapy: Secondary | ICD-10-CM | POA: Diagnosis not present

## 2017-12-19 DIAGNOSIS — M2749 Other cysts of jaw: Secondary | ICD-10-CM | POA: Diagnosis not present

## 2017-12-21 ENCOUNTER — Telehealth: Payer: Self-pay | Admitting: Cardiology

## 2017-12-21 MED ORDER — AMLODIPINE BESYLATE 5 MG PO TABS: 5.0000 mg | ORAL_TABLET | Freq: Every day | ORAL | 3 refills | Status: DC

## 2017-12-21 NOTE — Telephone Encounter (Signed)
Pt notified and voiced understanding 

## 2017-12-21 NOTE — Telephone Encounter (Signed)
Pt states she's been having some high BP readings w/ low pulse rates   Monday morning  184/89 pulse 52  Monday evening  164/89 pulse 62  Tuesday morning 140/82 pulse 62  Tuesday evening  152/86 pulse 60  Wednesday morning 132/81 pulse 63   Pt states she hasn't had any chest pain, sob, states she's had some pain in her bones, sides and hips, she thought her potassium was low so she took one of her pills and it did make feel better.

## 2017-12-21 NOTE — Telephone Encounter (Signed)
Patient states that she is taking her BP 1 hour after taking her BP meds. She states that she feels fine today. Please advise.

## 2017-12-21 NOTE — Telephone Encounter (Signed)
Would have her increase Norvasc to 5 mg daily and otherwise continue present medicines.

## 2018-01-04 DIAGNOSIS — E785 Hyperlipidemia, unspecified: Secondary | ICD-10-CM | POA: Diagnosis not present

## 2018-01-04 DIAGNOSIS — I10 Essential (primary) hypertension: Secondary | ICD-10-CM | POA: Diagnosis not present

## 2018-01-06 DIAGNOSIS — E785 Hyperlipidemia, unspecified: Secondary | ICD-10-CM | POA: Diagnosis not present

## 2018-01-06 DIAGNOSIS — I10 Essential (primary) hypertension: Secondary | ICD-10-CM | POA: Diagnosis not present

## 2018-01-06 DIAGNOSIS — J309 Allergic rhinitis, unspecified: Secondary | ICD-10-CM | POA: Diagnosis not present

## 2018-01-06 DIAGNOSIS — Z79899 Other long term (current) drug therapy: Secondary | ICD-10-CM | POA: Diagnosis not present

## 2018-01-14 ENCOUNTER — Emergency Department (HOSPITAL_COMMUNITY): Payer: Medicare PPO

## 2018-01-14 ENCOUNTER — Other Ambulatory Visit: Payer: Self-pay

## 2018-01-14 ENCOUNTER — Emergency Department (HOSPITAL_COMMUNITY)
Admission: EM | Admit: 2018-01-14 | Discharge: 2018-01-14 | Disposition: A | Discharge status: Home / Self Care | Payer: Medicare PPO | Attending: Emergency Medicine | Admitting: Emergency Medicine

## 2018-01-14 ENCOUNTER — Encounter (HOSPITAL_COMMUNITY): Payer: Self-pay | Admitting: Emergency Medicine

## 2018-01-14 DIAGNOSIS — I2510 Atherosclerotic heart disease of native coronary artery without angina pectoris: Secondary | ICD-10-CM | POA: Insufficient documentation

## 2018-01-14 DIAGNOSIS — R935 Abnormal findings on diagnostic imaging of other abdominal regions, including retroperitoneum: Secondary | ICD-10-CM | POA: Insufficient documentation

## 2018-01-14 DIAGNOSIS — R9389 Abnormal findings on diagnostic imaging of other specified body structures: Secondary | ICD-10-CM

## 2018-01-14 DIAGNOSIS — M545 Low back pain, unspecified: Secondary | ICD-10-CM

## 2018-01-14 DIAGNOSIS — I119 Hypertensive heart disease without heart failure: Secondary | ICD-10-CM | POA: Insufficient documentation

## 2018-01-14 DIAGNOSIS — R93429 Abnormal radiologic findings on diagnostic imaging of unspecified kidney: Secondary | ICD-10-CM | POA: Diagnosis not present

## 2018-01-14 DIAGNOSIS — Z951 Presence of aortocoronary bypass graft: Secondary | ICD-10-CM | POA: Insufficient documentation

## 2018-01-14 DIAGNOSIS — R350 Frequency of micturition: Secondary | ICD-10-CM | POA: Diagnosis not present

## 2018-01-14 DIAGNOSIS — Z7982 Long term (current) use of aspirin: Secondary | ICD-10-CM | POA: Diagnosis not present

## 2018-01-14 DIAGNOSIS — M25552 Pain in left hip: Secondary | ICD-10-CM | POA: Insufficient documentation

## 2018-01-14 DIAGNOSIS — Z79899 Other long term (current) drug therapy: Secondary | ICD-10-CM | POA: Insufficient documentation

## 2018-01-14 DIAGNOSIS — K449 Diaphragmatic hernia without obstruction or gangrene: Secondary | ICD-10-CM | POA: Diagnosis not present

## 2018-01-14 LAB — CBC WITH DIFFERENTIAL/PLATELET
BASOS ABS: 0 10*3/uL (ref 0.0–0.1)
BASOS PCT: 0 %
EOS ABS: 0.1 10*3/uL (ref 0.0–0.7)
Eosinophils Relative: 2 %
HEMATOCRIT: 39.3 % (ref 36.0–46.0)
HEMOGLOBIN: 12.3 g/dL (ref 12.0–15.0)
Lymphocytes Relative: 33 %
Lymphs Abs: 1.2 10*3/uL (ref 0.7–4.0)
MCH: 28.9 pg (ref 26.0–34.0)
MCHC: 31.3 g/dL (ref 30.0–36.0)
MCV: 92.3 fL (ref 78.0–100.0)
Monocytes Absolute: 0.3 10*3/uL (ref 0.1–1.0)
Monocytes Relative: 8 %
NEUTROS ABS: 2.1 10*3/uL (ref 1.7–7.7)
NEUTROS PCT: 57 %
Platelets: 124 10*3/uL — ABNORMAL LOW (ref 150–400)
RBC: 4.26 MIL/uL (ref 3.87–5.11)
RDW: 14 % (ref 11.5–15.5)
WBC: 3.7 10*3/uL — AB (ref 4.0–10.5)

## 2018-01-14 LAB — URINALYSIS, ROUTINE W REFLEX MICROSCOPIC
BILIRUBIN URINE: NEGATIVE
GLUCOSE, UA: NEGATIVE mg/dL
HGB URINE DIPSTICK: NEGATIVE
Ketones, ur: NEGATIVE mg/dL
NITRITE: NEGATIVE
Protein, ur: NEGATIVE mg/dL
SPECIFIC GRAVITY, URINE: 1.023 (ref 1.005–1.030)
pH: 5 (ref 5.0–8.0)

## 2018-01-14 LAB — COMPREHENSIVE METABOLIC PANEL
ALK PHOS: 64 U/L (ref 38–126)
ALT: 17 U/L (ref 14–54)
ANION GAP: 9 (ref 5–15)
AST: 17 U/L (ref 15–41)
Albumin: 3.9 g/dL (ref 3.5–5.0)
BILIRUBIN TOTAL: 0.7 mg/dL (ref 0.3–1.2)
BUN: 19 mg/dL (ref 6–20)
CALCIUM: 9.1 mg/dL (ref 8.9–10.3)
CO2: 27 mmol/L (ref 22–32)
CREATININE: 0.82 mg/dL (ref 0.44–1.00)
Chloride: 107 mmol/L (ref 101–111)
Glucose, Bld: 105 mg/dL — ABNORMAL HIGH (ref 65–99)
Potassium: 3.5 mmol/L (ref 3.5–5.1)
SODIUM: 143 mmol/L (ref 135–145)
TOTAL PROTEIN: 6.8 g/dL (ref 6.5–8.1)

## 2018-01-14 LAB — LIPASE, BLOOD: Lipase: 21 U/L (ref 11–51)

## 2018-01-14 MED ORDER — NAPROXEN 375 MG PO TABS: 375.0000 mg | ORAL_TABLET | Freq: Two times a day (BID) | ORAL | 0 refills | Status: DC

## 2018-01-14 MED ORDER — CEPHALEXIN 500 MG PO CAPS: 500.0000 mg | ORAL_CAPSULE | Freq: Two times a day (BID) | ORAL | 0 refills | Status: AC

## 2018-01-14 MED ORDER — LIDOCAINE 5 % EX PTCH: 1.0000 | MEDICATED_PATCH | CUTANEOUS | 0 refills | Status: DC

## 2018-01-14 NOTE — ED Notes (Signed)
Offered tylenol for pain and pt declined at this time.  Resting comfortable in bed.

## 2018-01-14 NOTE — ED Provider Notes (Signed)
[facility] EMERGENCY DEPARTMENT Provider Note   CSN: [id number] Arrival date & time: 01/14/18  8469     History   Chief Complaint Chief Complaint  Patient presents with  . Hip Pain    HPI [name] [name] is a 77 y.o. female with a history of CAD, HTN who presents with left low back and left hip pain.  Patient reports over the last 2-3 weeks she has been having pain in her left low back that radiates into her left hip.  She reports that this pain is intermittent and is worse with leaning forward and also with ambulation.  She feels like there is a muscle pulling in her left lower back.  She notes she has not taken anything for her symptoms as she thought it would go away.  She denies any preceding trauma, increase in activity or heavy lifting.  Patient also notes she has been having some urinary frequency, urinary urgency and dysuria since onset of her symptoms.  She denies any fever, chills, nausea/vomiting, hematuria, abdominal pain or history of kidney stones.  Patient rates her pain as a 8/10 and describes it as a aching sensation.  Patient denies any history of fever, numbness/tingling/weakness of the lower extremities, urinary retention, bowel/bladder incontinence, saddle anesthesia.   HPI  Past Medical History:  Diagnosis Date  . CAD (coronary artery disease)    Multivessel, PTCA of posterior descending followed by CABG January 2017  . Essential hypertension   . ST elevation myocardial infarction (STEMI) of inferior wall [facility])    January 2017  . Wound infection after surgery    Sternal wound infection requiring debridement, pectoralis muscle flap repair, wound [facility] February 2017    Patient Active Problem List   Diagnosis Date Noted  . Wound infection after surgery 09/01/2015  . 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 Sternal wound dehiscence 08/31/2015  . S/P CABG x 3 08/14/2015  . CAD (coronary artery disease), native coronary artery 08/11/2015  . CAD (coronary artery  disease)   . Acute inferior myocardial infarction ([place]) 08/10/2015  . Acute MI, inferior wall, initial episode of care ([place])   . Essential hypertension 02/19/2015    Past Surgical History:  Procedure Laterality Date  . ABDOMINAL HYSTERECTOMY    . APPENDECTOMY    . APPLICATION OF WOUND VAC N/A 09/04/2015   Procedure: WOUND VAC CHANGE;  Surgeon: [name], MD;  Location: [place];  Service: Thoracic;  Laterality: N/A;  . BACK SURGERY    . CARDIAC CATHETERIZATION N/A 08/10/2015   Procedure: Left Heart Cath and Coronary Angiography;  Surgeon: [name], MD;  Location: [place] CV LAB;  Service: Cardiovascular;  Laterality: N/A;  . CARDIAC CATHETERIZATION  08/10/2015   Procedure: Coronary Balloon Angioplasty;  Surgeon: [name], MD;  Location: [place] CV LAB;  Service: Cardiovascular;;  . CHEST TUBE INSERTION Left 09/08/2015   Procedure: CHEST TUBE INSERTION;  Surgeon: [name], MD;  Location: [place];  Service: Thoracic;  Laterality: Left;  . COLONOSCOPY N/A 03/29/2013   Procedure: COLONOSCOPY;  Surgeon: Rogene Houston, MD;  [facility];  Service: Endoscopy;  Laterality: N/A;  930  . CORONARY ARTERY BYPASS GRAFT N/A 08/14/2015   Procedure: CORONARY ARTERY BYPASS GRAFTING (CABG)X3 LIMA-LAD; SVG-RAMUS; SVG-PD TRANSESOPHAGEAL ECHOCARDIOGRAM (TEE);  Surgeon: [name], MD;  Location: [place];  Service: Open Heart Surgery;  Laterality: N/A;  . PECTORALIS FLAP N/A 09/08/2015   Procedure: Muscle FLAP;  Surgeon: Loel Lofty  Dillingham, DO;  Location: [place];  Service: Plastics;  Laterality: N/A;  . STERNAL WOUND DEBRIDEMENT N/A 09/01/2015   Procedure: STERNAL WOUND DEBRIDEMENT;  Surgeon: [name], MD;  Location: [place];  Service: Thoracic;  Laterality: N/A;  . STERNAL WOUND DEBRIDEMENT N/A 09/04/2015   Procedure: STERNAL WOUND DEBRIDEMENT;  Surgeon: [name], MD;  Location: [place];  Service: Thoracic;  Laterality: N/A;  . STERNAL WOUND DEBRIDEMENT N/A  09/08/2015   Procedure: STERNAL WOUND DEBRIDEMENT;  Surgeon: [name], MD;  Location: [place];  Service: Thoracic;  Laterality: N/A;  . TEE WITHOUT CARDIOVERSION N/A 08/14/2015   Procedure: TRANSESOPHAGEAL ECHOCARDIOGRAM (TEE);  Surgeon: [name], MD;  Location: [place];  Service: Open Heart Surgery;  Laterality: N/A;     OB History   None      Home Medications    Prior to Admission medications   Medication Sig Start Date End Date Taking? Authorizing Provider  amLODipine (NORVASC) 5 MG tablet Take 1 tablet (5 mg total) by mouth at bedtime. 12/21/17 03/21/18  Satira Sark, MD  aspirin EC 81 MG tablet Take 81 mg by mouth daily.    [provider]  atorvastatin (LIPITOR) 40 MG tablet Take 1 tablet (40 mg total) by mouth daily. 04/15/16 03/16/17  Satira Sark, MD  diazepam (VALIUM) 5 MG tablet Take 1 tablet (5 mg total) by mouth every 8 (eight) hours as needed for muscle spasms. 08/17/17   Virgel Manifold, MD  docusate sodium (DOCU SOFT) 100 MG capsule Take 100 mg by mouth 2 (two) times daily.    [provider]  HYDROcodone-acetaminophen (NORCO/VICODIN) 5-325 MG tablet Take 1 tablet by mouth every 4 (four) hours as needed. 08/17/17   Virgel Manifold, MD  losartan-hydrochlorothiazide (HYZAAR) 100-25 MG tablet Take 1 tablet by mouth daily.  03/05/16   [provider]  metoprolol tartrate (LOPRESSOR) 50 MG tablet Take 1.5 tablets (75 mg total) by mouth 2 (two) times daily. 04/19/17 07/18/17  Satira Sark, MD  Multiple Vitamin (MULTIVITAMIN) tablet Take 1 tablet by mouth daily.    [provider]    Family History Family History  Problem Relation Age of Onset  . Colon cancer Neg Hx     Social History Social History   Tobacco Use  . Smoking status: Never Smoker  . Smokeless tobacco: Never Used  Substance Use Topics  . Alcohol use: No    Alcohol/week: 0.0 oz  . Drug use: No     Allergies   Other and Peanut-containing drug  products   Review of Systems Review of Systems  All other systems reviewed and are negative.    Physical Exam Updated Vital Signs BP (!) 152/79 (BP Location: Right Arm)   Pulse 64   Temp 97.9 F (36.6 C) (Oral)   Resp 16   Ht 5\' 9"  (1.753 m)   Wt 74.4 kg (164 lb)   SpO2 100%   BMI 24.22 kg/m   Physical Exam  Constitutional: She appears well-developed and well-nourished. No distress.  Non-toxic appearing  HENT:  Head: Normocephalic and atraumatic.  Right Ear: External ear normal.  Left Ear: External ear normal.  Neck: Normal range of motion. Neck supple. No spinous process tenderness present. No neck rigidity. Normal range of motion present.  Cardiovascular: Normal rate, regular rhythm, normal heart sounds and intact distal pulses.  No murmur heard. Pulses:      Radial pulses are 2+ on the right side, and 2+  on the left side.       Femoral pulses are 2+ on the right side, and 2+ on the left side.      Dorsalis pedis pulses are 2+ on the right side, and 2+ on the left side.       Posterior tibial pulses are 2+ on the right side, and 2+ on the left side.  Pulmonary/Chest: Effort normal and breath sounds normal. No respiratory distress.  Abdominal: Soft. Bowel sounds are normal. She exhibits no distension and no pulsatile midline mass. There is no tenderness. There is no rigidity, no rebound, no guarding and no CVA tenderness.  Musculoskeletal:  Posterior and appearance appears normal. No evidence of obvious scoliosis or kyphosis. No obvious signs of skin changes, trauma, deformity, infection. No C, T, or L spine tenderness or step-offs to palpation. No C, T paraspinal tenderness. Patient with left lumbar paraspinal TTP. She also is with superior gluteal ttp.  No point tenderness palpation over hip.  Negative logroll test.  No sacral crepitus.  She is able to passively flex, extend, internally/externally rotate the hip without limited range of motion.  She notes some pain with  maximal external rotation of the hip.  Lung expansion normal. Bilateral lower extremity strength 5 out of 5 including extensor hallucis longus. Patellar and Achilles deep tendon reflex 2+ and equal bilaterally. Sensation of lower extremities grossly intact. Gait able but patient notes painful. Lower extremity compartments soft. PT and DP 2+ b/l. Cap refill <2 seconds.   Neurological: She is alert. She has normal reflexes. No cranial nerve deficit or sensory deficit. Gait normal.  No foot drop  Skin: Skin is warm, dry and intact. Capillary refill takes less than 2 seconds. No rash noted. She is not diaphoretic. No erythema.  No vesicular-like rash noted.  Nursing note and vitals reviewed.    ED Treatments / Results  Labs (all labs ordered are listed, but only abnormal results are displayed) Labs Reviewed  URINALYSIS, ROUTINE W REFLEX MICROSCOPIC - Abnormal; Notable for the following components:      Result Value   APPearance HAZY (*)    Leukocytes, UA MODERATE (*)    Bacteria, UA RARE (*)    All other components within normal limits  COMPREHENSIVE METABOLIC PANEL - Abnormal; Notable for the following components:   Glucose, Bld 105 (*)    All other components within normal limits  CBC WITH DIFFERENTIAL/PLATELET - Abnormal; Notable for the following components:   WBC 3.7 (*)    Platelets 124 (*)    All other components within normal limits  URINE CULTURE  LIPASE, BLOOD    EKG None  Radiology Ct L-spine No Charge  Result Date: 01/14/2018 CLINICAL DATA:  Low back pain.  Left hip/buttock pain for 3 weeks. EXAM: CT LUMBAR SPINE WITHOUT CONTRAST TECHNIQUE: Multidetector CT imaging of the lumbar spine was performed without intravenous contrast administration. Multiplanar CT image reconstructions were also generated. COMPARISON:  No prior lumbar spine imaging.  Chest CT 08/31/2015. FINDINGS: Segmentation: Comparison chest CT demonstrates 11 full sized pairs of ribs. There are no ribs at  T12, and there are 5 additional non rib-bearing lumbar type vertebrae. Alignment: 8 mm anterolisthesis of L4 on L5, facet mediated. Trace retrolisthesis of L3 on L4. Vertebrae: No acute fracture or suspicious osseous lesion. Degenerative endplate changes at D9-R4 greater than L4-5 with moderate disc space narrowing at both levels. Schmorl's nodes at L4-5 as well as in the T12 and L1 inferior endplates. Paraspinal and other  soft tissues: Lung bases and intra-abdominal contents more fully evaluated on separate abdomen and pelvis CT. No significant paraspinal soft tissue abnormality. Disc levels: T12-L1: Mild right facet arthrosis without stenosis. L1-2: Mild facet arthrosis and minimal disc bulging without stenosis. L2-3: Mild-to-moderate right and mild left facet arthrosis and minimal disc bulging without stenosis. L3-4: Mild circumferential disc bulging and mild-to-moderate facet and ligamentum flavum hypertrophy result in mild bilateral lateral recess stenosis and mild right neural foraminal stenosis without significant spinal stenosis. L4-5: Anterolisthesis with bulging uncovered disc, ligamentum flavum hypertrophy, and severe bilateral facet arthrosis result in moderate spinal stenosis, bilateral lateral recess stenosis, and moderate bilateral neural foraminal stenosis. Potential L5 nerve root impingement in the lateral recesses bilaterally. L5-S1: Disc bulging, endplate spurring, and moderate right and mild left facet arthrosis without significant stenosis. Prior right laminectomy with 1.8 cm partially rim calcified fluid collection/pseudomeningocele at the laminectomy site. IMPRESSION: 1. No acute osseous abnormality identified. 2. Severe L4-5 facet arthrosis with grade 1 anterolisthesis and moderate spinal, lateral recess, and neural foraminal stenosis. 3. L5-S1 disc degeneration and postoperative changes without significant stenosis. 4. Mild lateral recess and right neural foraminal stenosis at L3-4.  Electronically Signed   By: Logan Bores M.D.   On: 01/14/2018 10:04   Ct Renal Stone Study  Result Date: 01/14/2018 CLINICAL DATA:  Left hip and gluteal pain 3 weeks worse with movement. EXAM: CT ABDOMEN AND PELVIS WITHOUT CONTRAST TECHNIQUE: Multidetector CT imaging of the abdomen and pelvis was performed following the standard protocol without IV contrast. COMPARISON:  Chest CT 08/31/2015 FINDINGS: Lower chest: Lung bases demonstrate minimal linear atelectasis posteromedial right base. Small hiatal hernia. Hepatobiliary: Normal. Pancreas: Normal. Spleen: Normal. Adrenals/Urinary Tract: Adrenal glands are normal. Kidneys are normal in size without hydronephrosis or nephrolithiasis. 1.2 cm left renal cyst is present. There are a few bilateral subcentimeter renal cortical hyperdensities present likely tiny hyperdense cysts. Ureters are within normal. Bladder is unremarkable. Stomach/Bowel: Small hiatal hernia. Small bowel is within normal. Previous appendectomy. Colon is unremarkable. Vascular/Lymphatic: Mild calcified plaque over the abdominal aorta. No adenopathy. Reproductive: Previous hysterectomy. Other: No free fluid or focal inflammatory change. There is a small hernia over the left lower quadrant with only partial segment of colon projecting over the fascial defect. Musculoskeletal: Mild degenerate changes of the spine with disc disease at the L4-5 and L5-S1 levels. Mild grade 1 anterolisthesis of L4 on L5. Mild degenerate change of the hips. IMPRESSION: No acute findings in the abdomen/pelvis. Abdominal wall hernia over the left lower quadrant with partial segment of colon projecting over the fascial defect. Small hiatal hernia. Few tiny subcentimeter bilateral renal cortical hyperdensities likely hyperdense cysts. 1.2 cm simple left renal cyst. Recommend follow-up CT pre and post contrast 1 year. Aortic Atherosclerosis (ICD10-I70.0). Electronically Signed   By: Marin Olp M.D.   On: 01/14/2018 10:05     Procedures Procedures (including critical care time)  Medications Ordered in ED Medications - No data to display   Initial Impression / Assessment and Plan / ED Course  I have reviewed the triage vital signs and the nursing notes.  Pertinent labs & imaging results that were available during my care of the patient were reviewed by me and considered in my medical decision making (see chart for details).     77 year old female that presents with left low back pain with radiation to the left hip over the last 2-3 weeks it is worse with range of motion.  She also reports urinary symptoms including  frequency, urgency and dysuria.  She denies any abdominal pain.  Patient's vitals are reassuring on presentation.  She is without fever, tachycardia, tachypnea, hypoxia or hypotension. No neurologic deficits and normal neuro exam.  Patient can walk but states it is painful.  No bowel or bladder incontinence.  No urinary retention or saddle anesthesia.  No concern for cauda equina.  Patient without vesicular-like rash on the left flank/abdomen to make me concern for shingles.  Patient denies any IV drug use or spinal injections make me concern for spinal abscess or hematoma.  Patient without pulsatile mass of the abdomen.  She is without any abdominal tenderness palpation.  Patient has grossly normal range of motion of left hip.  There is no leg shortening or external rotation.  Compartments are soft.  She is neurovascular intact.  Given patient's urinary symptoms will start with UA to see if symptoms are caused by UTI. Patient offered pain medication in the department but defers at this time.   UA with questional UTI.  Will send culture and further work-up with lab work and CT renal stone study to evaluate for kidney stone.  Will add on no charge L spine.  CT scan reassuring.  No evidence of kidney stones.  Patient with abdominal wall hernia without evidence of incarceration.  There is a small partial  segment of the colon projecting over the fascial deficit.  There are noted possible renal cyst that will need follow-up in 1 year.  Will include on discharge paperwork.  CT L-spine shows degenerative changes.  No acute osseous abnormalities.  Lab work is reassuring. Will treat for muscle strain versus UTI. Kidney function wnl. No history of GI bleed reported. Will give short course of nsaids. The evaluation does not show pathology that would require ongoing emergent intervention or inpatient treatment. I advised the patient to follow-up with PCP this week. I advised the patient to return to the emergency department with new or worsening symptoms or new concerns. Specific return precautions discussed. The patient verbalized understanding and agreement with plan. All questions answered. No further questions at this time. The patient is hemodynamically stable, mentating appropriately and appears safe for discharge.  Patient case seen and discussed with Dr. Roderic Palau who is in agreement with plan.  Final Clinical Impressions(s) / ED Diagnoses   Final diagnoses:  Low back pain  Acute left-sided low back pain without sciatica  Urinary frequency  Abnormal CT scan    ED Discharge Orders        Ordered    lidocaine (LIDODERM) 5 %  Every 24 hours     01/14/18 1106    naproxen (NAPROSYN) 375 MG tablet  2 times daily     01/14/18 1106    cephALEXin (KEFLEX) 500 MG capsule  2 times daily     01/14/18 1106       Lorelle Gibbs 01/14/18 1531    Milton Ferguson, MD 01/14/18 1646

## 2018-01-14 NOTE — Discharge Instructions (Signed)
You are being treated for low back pain and urinary tract infection. Follow attached handouts. Follow up with your primary care doctor this week.   Your CT scan showed Few tiny subcentimeter bilateral renal cortical hyperdensities, likely hyperdense cysts. 1.2 cm simple left renal cyst. They recommended follow-up CT pre and post contrast 1 year. Please see your PCP for this.   Take Naproxen with food as prescribed. Use lidocaine patches as prescribed.   Please take all of your antibiotics until finished!   You may develop abdominal discomfort or diarrhea from the antibiotic.  You may help offset this with probiotics which you can buy or get in yogurt. Do not eat or take the probiotics until 2 hours after your antibiotic. Do not take your medicine if develop an itchy rash, swelling in your mouth or lips, or difficulty breathing.    If you develop worsening or new concerning symptoms you can return to the emergency department for re-evaluation.

## 2018-01-14 NOTE — ED Triage Notes (Signed)
Pt reports pain in left hip/gluteal area for the past 3 weeks.  States it hurts worse to move or bend forward.  Feels like something pulling above hip area.

## 2018-01-16 LAB — URINE CULTURE

## 2018-02-03 DIAGNOSIS — E782 Mixed hyperlipidemia: Secondary | ICD-10-CM | POA: Diagnosis not present

## 2018-02-03 DIAGNOSIS — R001 Bradycardia, unspecified: Secondary | ICD-10-CM | POA: Diagnosis not present

## 2018-02-03 DIAGNOSIS — I2510 Atherosclerotic heart disease of native coronary artery without angina pectoris: Secondary | ICD-10-CM | POA: Diagnosis not present

## 2018-02-03 DIAGNOSIS — Z6824 Body mass index (BMI) 24.0-24.9, adult: Secondary | ICD-10-CM | POA: Diagnosis not present

## 2018-02-03 DIAGNOSIS — I10 Essential (primary) hypertension: Secondary | ICD-10-CM | POA: Diagnosis not present

## 2018-02-24 DIAGNOSIS — I2510 Atherosclerotic heart disease of native coronary artery without angina pectoris: Secondary | ICD-10-CM | POA: Diagnosis not present

## 2018-02-24 DIAGNOSIS — E782 Mixed hyperlipidemia: Secondary | ICD-10-CM | POA: Diagnosis not present

## 2018-02-24 DIAGNOSIS — R7301 Impaired fasting glucose: Secondary | ICD-10-CM | POA: Diagnosis not present

## 2018-02-24 DIAGNOSIS — R001 Bradycardia, unspecified: Secondary | ICD-10-CM | POA: Diagnosis not present

## 2018-02-24 DIAGNOSIS — I10 Essential (primary) hypertension: Secondary | ICD-10-CM | POA: Diagnosis not present

## 2018-02-24 DIAGNOSIS — Z6824 Body mass index (BMI) 24.0-24.9, adult: Secondary | ICD-10-CM | POA: Diagnosis not present

## 2018-02-27 DIAGNOSIS — R001 Bradycardia, unspecified: Secondary | ICD-10-CM | POA: Diagnosis not present

## 2018-02-27 DIAGNOSIS — R7301 Impaired fasting glucose: Secondary | ICD-10-CM | POA: Diagnosis not present

## 2018-02-27 DIAGNOSIS — E782 Mixed hyperlipidemia: Secondary | ICD-10-CM | POA: Diagnosis not present

## 2018-02-27 DIAGNOSIS — Z0001 Encounter for general adult medical examination with abnormal findings: Secondary | ICD-10-CM | POA: Diagnosis not present

## 2018-02-27 DIAGNOSIS — I2510 Atherosclerotic heart disease of native coronary artery without angina pectoris: Secondary | ICD-10-CM | POA: Diagnosis not present

## 2018-02-27 DIAGNOSIS — R140 Abdominal distension (gaseous): Secondary | ICD-10-CM | POA: Diagnosis not present

## 2018-02-27 DIAGNOSIS — Z6824 Body mass index (BMI) 24.0-24.9, adult: Secondary | ICD-10-CM | POA: Diagnosis not present

## 2018-02-27 DIAGNOSIS — I10 Essential (primary) hypertension: Secondary | ICD-10-CM | POA: Diagnosis not present

## 2018-02-27 DIAGNOSIS — K5900 Constipation, unspecified: Secondary | ICD-10-CM | POA: Diagnosis not present

## 2018-03-31 ENCOUNTER — Encounter (INDEPENDENT_AMBULATORY_CARE_PROVIDER_SITE_OTHER): Payer: Self-pay | Admitting: *Deleted

## 2018-04-04 ENCOUNTER — Other Ambulatory Visit: Payer: Self-pay | Admitting: Cardiology

## 2018-04-18 DIAGNOSIS — Z0000 Encounter for general adult medical examination without abnormal findings: Secondary | ICD-10-CM | POA: Diagnosis not present

## 2018-05-02 DIAGNOSIS — H43813 Vitreous degeneration, bilateral: Secondary | ICD-10-CM | POA: Diagnosis not present

## 2018-05-02 DIAGNOSIS — H5203 Hypermetropia, bilateral: Secondary | ICD-10-CM | POA: Diagnosis not present

## 2018-05-02 DIAGNOSIS — H2513 Age-related nuclear cataract, bilateral: Secondary | ICD-10-CM | POA: Diagnosis not present

## 2018-05-02 DIAGNOSIS — H52222 Regular astigmatism, left eye: Secondary | ICD-10-CM | POA: Diagnosis not present

## 2018-05-09 DIAGNOSIS — H2511 Age-related nuclear cataract, right eye: Secondary | ICD-10-CM | POA: Diagnosis not present

## 2018-05-09 DIAGNOSIS — H2513 Age-related nuclear cataract, bilateral: Secondary | ICD-10-CM | POA: Diagnosis not present

## 2018-05-09 DIAGNOSIS — Z01818 Encounter for other preprocedural examination: Secondary | ICD-10-CM | POA: Diagnosis not present

## 2018-05-24 DIAGNOSIS — H2511 Age-related nuclear cataract, right eye: Secondary | ICD-10-CM | POA: Diagnosis not present

## 2018-05-24 DIAGNOSIS — H25811 Combined forms of age-related cataract, right eye: Secondary | ICD-10-CM | POA: Diagnosis not present

## 2018-06-07 DIAGNOSIS — H2512 Age-related nuclear cataract, left eye: Secondary | ICD-10-CM | POA: Diagnosis not present

## 2018-06-07 DIAGNOSIS — H25812 Combined forms of age-related cataract, left eye: Secondary | ICD-10-CM | POA: Diagnosis not present

## 2018-09-04 DIAGNOSIS — I10 Essential (primary) hypertension: Secondary | ICD-10-CM | POA: Diagnosis not present

## 2018-09-04 DIAGNOSIS — R7301 Impaired fasting glucose: Secondary | ICD-10-CM | POA: Diagnosis not present

## 2018-09-04 DIAGNOSIS — E782 Mixed hyperlipidemia: Secondary | ICD-10-CM | POA: Diagnosis not present

## 2018-09-11 DIAGNOSIS — R7301 Impaired fasting glucose: Secondary | ICD-10-CM | POA: Diagnosis not present

## 2018-09-11 DIAGNOSIS — D696 Thrombocytopenia, unspecified: Secondary | ICD-10-CM | POA: Diagnosis not present

## 2018-09-11 DIAGNOSIS — K5900 Constipation, unspecified: Secondary | ICD-10-CM | POA: Diagnosis not present

## 2018-09-11 DIAGNOSIS — R141 Gas pain: Secondary | ICD-10-CM | POA: Diagnosis not present

## 2018-09-11 DIAGNOSIS — E782 Mixed hyperlipidemia: Secondary | ICD-10-CM | POA: Diagnosis not present

## 2018-09-11 DIAGNOSIS — I2510 Atherosclerotic heart disease of native coronary artery without angina pectoris: Secondary | ICD-10-CM | POA: Diagnosis not present

## 2018-09-11 DIAGNOSIS — R001 Bradycardia, unspecified: Secondary | ICD-10-CM | POA: Diagnosis not present

## 2018-09-11 DIAGNOSIS — R140 Abdominal distension (gaseous): Secondary | ICD-10-CM | POA: Diagnosis not present

## 2018-09-11 DIAGNOSIS — I10 Essential (primary) hypertension: Secondary | ICD-10-CM | POA: Diagnosis not present

## 2018-09-19 DIAGNOSIS — Z1212 Encounter for screening for malignant neoplasm of rectum: Secondary | ICD-10-CM | POA: Diagnosis not present

## 2018-09-19 DIAGNOSIS — Z1211 Encounter for screening for malignant neoplasm of colon: Secondary | ICD-10-CM | POA: Diagnosis not present

## 2018-10-31 ENCOUNTER — Encounter: Payer: Self-pay | Admitting: Cardiology

## 2018-10-31 ENCOUNTER — Telehealth (INDEPENDENT_AMBULATORY_CARE_PROVIDER_SITE_OTHER): Payer: Medicare PPO | Admitting: Cardiology

## 2018-10-31 ENCOUNTER — Telehealth: Payer: Self-pay | Admitting: *Deleted

## 2018-10-31 DIAGNOSIS — E782 Mixed hyperlipidemia: Secondary | ICD-10-CM

## 2018-10-31 DIAGNOSIS — Z7982 Long term (current) use of aspirin: Secondary | ICD-10-CM

## 2018-10-31 DIAGNOSIS — I10 Essential (primary) hypertension: Secondary | ICD-10-CM | POA: Diagnosis not present

## 2018-10-31 DIAGNOSIS — I25119 Atherosclerotic heart disease of native coronary artery with unspecified angina pectoris: Secondary | ICD-10-CM | POA: Diagnosis not present

## 2018-10-31 DIAGNOSIS — Z79899 Other long term (current) drug therapy: Secondary | ICD-10-CM | POA: Diagnosis not present

## 2018-10-31 DIAGNOSIS — R001 Bradycardia, unspecified: Secondary | ICD-10-CM

## 2018-10-31 NOTE — Patient Instructions (Signed)

## 2018-10-31 NOTE — Progress Notes (Signed)
Virtual Visit via Video Note   This visit type was conducted due to national recommendations for restrictions regarding the COVID-19 Pandemic (e.g. social distancing) in an effort to limit this patient's exposure and mitigate transmission in our community.  Due to her co-morbid illnesses, this patient is at least at moderate risk for complications without adequate follow up.  This format is felt to be most appropriate for this patient at this time.  All issues noted in this document were discussed and addressed.  A limited physical exam was performed with this format.  She verbally consented to videoconferencing today with [facility] understands that her insurance company will be billedfor the encounter.  Evaluation Performed:  Follow-up visit  Date:  10/31/2018   ID:  [name] [name], [name] [name] Mar 03, 1941, MRN [id number]  Patient Location: Home  Provider Location: Office  PCP:  Celene Squibb, MD  Cardiologist:  Satira Sark, MD  Chief Complaint:  Follow-up CAD and symptom control  History of Present Illness:    [name] [name] is a 78 y.o. female who presents via audio/video conferencing for a telehealth visit today.  She was last seen in February 2019.  This is a routine visit today, she does not report any angina symptoms or nitroglycerin use.  She does get a "soreness" in the left side of her chest sometimes when she exercises, but states that it actually feels sore when she touches her chest suggesting a musculoskeletal etiology.  This has not limited her activities.  She has not been able to go to the senior center due to its temporary closing during the COVID-19 pandemic.  She is walking outdoors.  Still drives and does her own shopping, although has been staying around the house these days.  I reviewed her recent lab work from February which showed good cholesterol control, LDL 48. Her PCP now is Dr. Nevada Crane.  She reports no obvious side effects on Lipitor and otherwise states that  she has been compliant with her medications as listed below.  She is three years out from CABG. typically we would do ECG at this point, we will plan to check this at her next office encounter.  The patient does not have symptoms concerning for COVID-19 infection (fever, chills, cough, or new shortness of breath).   Past Medical History:  Diagnosis Date  . CAD (coronary artery disease)    Multivessel, PTCA of posterior descending followed by CABG January 2017  . Essential hypertension   . ST elevation myocardial infarction (STEMI) of inferior wall [facility])    January 2017  . Wound infection after surgery    Sternal wound infection requiring debridement, pectoralis muscle flap repair, wound [facility] February 2017   Past Surgical History:  Procedure Laterality Date  . ABDOMINAL HYSTERECTOMY    . APPENDECTOMY    . APPLICATION OF WOUND VAC N/A 09/04/2015   Procedure: WOUND VAC CHANGE;  Surgeon: [name], MD;  Location: [place];  Service: Thoracic;  Laterality: N/A;  . BACK SURGERY    . CARDIAC CATHETERIZATION N/A 08/10/2015   Procedure: Left Heart Cath and Coronary Angiography;  Surgeon: [name], MD;  Location: [place] CV LAB;  Service: Cardiovascular;  Laterality: N/A;  . CARDIAC CATHETERIZATION  08/10/2015   Procedure: Coronary Balloon Angioplasty;  Surgeon: [name], MD;  Location: [place] CV LAB;  Service: Cardiovascular;;  . CHEST TUBE INSERTION Left 09/08/2015   Procedure: CHEST TUBE INSERTION;  Surgeon: [name], MD;  [facility]: [facility];  Service: Thoracic;  Laterality: Left;  . COLONOSCOPY N/A 03/29/2013   Procedure: COLONOSCOPY;  Surgeon: Rogene Houston, MD;  [facility];  Service: Endoscopy;  Laterality: N/A;  930  . CORONARY ARTERY BYPASS GRAFT N/A 08/14/2015   Procedure: CORONARY ARTERY BYPASS GRAFTING (CABG)X3 LIMA-LAD; SVG-RAMUS; SVG-PD TRANSESOPHAGEAL ECHOCARDIOGRAM (TEE);  Surgeon: [name], MD;  Location: [place];  Service: Open  Heart Surgery;  Laterality: N/A;  . PECTORALIS FLAP N/A 09/08/2015   Procedure: Muscle FLAP;  Surgeon: [name], DO;  Location: [place];  Service: Plastics;  Laterality: N/A;  . STERNAL WOUND DEBRIDEMENT N/A 09/01/2015   Procedure: STERNAL WOUND DEBRIDEMENT;  Surgeon: [name], MD;  Location: [place];  Service: Thoracic;  Laterality: N/A;  . STERNAL WOUND DEBRIDEMENT N/A 09/04/2015   Procedure: STERNAL WOUND DEBRIDEMENT;  Surgeon: [name], MD;  Location: [place];  Service: Thoracic;  Laterality: N/A;  . STERNAL WOUND DEBRIDEMENT N/A 09/08/2015   Procedure: STERNAL WOUND DEBRIDEMENT;  Surgeon: [name], MD;  Location: [place];  Service: Thoracic;  Laterality: N/A;  . TEE WITHOUT CARDIOVERSION N/A 08/14/2015   Procedure: TRANSESOPHAGEAL ECHOCARDIOGRAM (TEE);  Surgeon: [name], MD;  Location: [place];  Service: Open Heart Surgery;  Laterality: N/A;     Current Meds  Medication Sig  . amLODipine (NORVASC) 5 MG tablet Take 1 tablet (5 mg total) by mouth at bedtime.  Marland Kitchen aspirin EC 81 MG tablet Take 81 mg by mouth daily.  Marland Kitchen atorvastatin (LIPITOR) 40 MG tablet Take 1 tablet (40 mg total) by mouth daily.  . diazepam (VALIUM) 5 MG tablet Take 1 tablet (5 mg total) by mouth every 8 (eight) hours as needed for muscle spasms.  Marland Kitchen HYDROcodone-acetaminophen (NORCO/VICODIN) 5-325 MG tablet Take 1 tablet by mouth every 4 (four) hours as needed.  Marland Kitchen losartan-hydrochlorothiazide (HYZAAR) 100-25 MG tablet Take 1 tablet by mouth daily.   . metoprolol tartrate (LOPRESSOR) 50 MG tablet TAKE 1 AND 1/2 TABLETS(75 MG) BY MOUTH TWICE DAILY  . Multiple Vitamin (MULTIVITAMIN) tablet Take 1 tablet by mouth daily.     Allergies:   Other and Peanut-containing drug products   Social History   Tobacco Use  . Smoking status: Never Smoker  . Smokeless tobacco: Never Used  Substance Use Topics  . Alcohol use: No    Alcohol/week: 0.0 standard drinks  . Drug use: No     Family Hx: The patient's  family history is negative for Colon cancer.  ROS:   Please see the history of present illness.    Intermittent arthritic symptoms. All other systems reviewed and are negative.   Prior CV studies:   The following studies were reviewed today:  Echocardiogram1/16/2017: Study Conclusions  - Left ventricle: The cavity size was normal. Wall thickness was increased in a pattern of mild LVH. Systolic function was normal. The estimated ejection fraction was in the range of 50% to 55%. Probable moderate hypokinesis of the basal-midinferior myocardium. Doppler parameters are consistent with abnormal left ventricular relaxation (grade 1 diastolic dysfunction). - Aortic valve: There was mild regurgitation. - Pericardium, extracardiac: A trivial pericardial effusion was identified posterior to the heart.  Labs/Other Tests and Data Reviewed:    EKG:  I personally reviewed the tracing from 08/17/2017 which showed sinus bradycardia with nonspecific ST-T changes and borderline low voltage.  Recent Labs: 01/14/2018: ALT 17; BUN 19; Creatinine, Ser 0.82; Hemoglobin 12.3; Platelets 124; Potassium 3.5; Sodium 143  February 2020: Hemoglobin 12.6, platelets 143, BUN 14, creatinine 0.92, potassium 3.9, AST 18, ALT 15, cholesterol 110, triglycerides 41, HDL 54, LDL 48, hemoglobin A1c 5.8%  Wt Readings from Last 3 Encounters:  10/31/18 168 lb (76.2 kg)  01/14/18 164 lb (74.4 kg)  09/13/17 170 lb (77.1 kg)     Objective:    Vital Signs:  BP 140/79   Pulse (!) 59   Ht 5\' 9"  (1.753 m)   Wt 168 lb (76.2 kg)   BMI 24.81 kg/m    Well-appearing elderly woman sitting on her porch enjoying the good weather.  Skin color and turgor appear normal.  Affect is normal, she answers questions spontaneously and with appropriate detail.  Breathing is nonlabored during discussion.  She states that she has had no leg edema.  ASSESSMENT & PLAN:    1.  Multivessel CAD status post CABG in January  2017.  She does not report any obvious angina symptoms or nitroglycerin use, does have an intermittent musculoskeletal sounding chest soreness which has been stable.  Plan is to continue medical therapy which includes aspirin and statin.  We will see her back in 6 months with ECG in the office, sooner if needed.  2.  Mixed hyperlipidemia, she continues on Lipitor.  Recent LDL 48.  3.  Essential hypertension, systolic 831 today.  No changes made to current regimen which also includes Norvasc and Hyzaar.  Keep follow-up with Dr. Nevada Crane.  4.  Bradycardia, asymptomatic.  She continues on beta-blocker as antianginal.  COVID-19 Education: The signs and symptoms of COVID-19 were discussed with the patient and how to seek care for testing (follow up with PCP or arrange E-visit).  The importance of social distancing was discussed today.  Time:   Today, I have spent 8 minutes with the patient with telehealth technology discussing the above problems.     Medication Adjustments/Labs and Tests Ordered: Current medicines are reviewed at length with the patient today.  Concerns regarding medicines are outlined above.  Tests Ordered: No orders of the defined types were placed in this encounter.  Medication Changes: No orders of the defined types were placed in this encounter.   Disposition:  Follow up 6 months  Signed, Rozann Lesches, MD  10/31/2018 1:27 PM    [facility]

## 2018-10-31 NOTE — Telephone Encounter (Addendum)
Medications and allergies reviewed The patient verbally consented for a telehealth video visit today with [facility] and understands that her insurance company will be billed for the encounter.

## 2018-11-14 ENCOUNTER — Encounter (HOSPITAL_COMMUNITY): Payer: Self-pay

## 2018-11-14 ENCOUNTER — Other Ambulatory Visit: Payer: Self-pay

## 2018-11-14 ENCOUNTER — Emergency Department (HOSPITAL_COMMUNITY): Payer: Medicare PPO

## 2018-11-14 ENCOUNTER — Inpatient Hospital Stay (HOSPITAL_COMMUNITY)
Admission: EM | Admit: 2018-11-14 | Discharge: 2018-11-16 | DRG: 066 | Disposition: A | Discharge status: Home / Self Care | Payer: Medicare PPO | Attending: Family Medicine | Admitting: Family Medicine

## 2018-11-14 DIAGNOSIS — I10 Essential (primary) hypertension: Secondary | ICD-10-CM | POA: Diagnosis present

## 2018-11-14 DIAGNOSIS — R471 Dysarthria and anarthria: Secondary | ICD-10-CM | POA: Diagnosis present

## 2018-11-14 DIAGNOSIS — I639 Cerebral infarction, unspecified: Secondary | ICD-10-CM | POA: Diagnosis present

## 2018-11-14 DIAGNOSIS — Z91010 Allergy to peanuts: Secondary | ICD-10-CM | POA: Diagnosis not present

## 2018-11-14 DIAGNOSIS — Z79899 Other long term (current) drug therapy: Secondary | ICD-10-CM | POA: Diagnosis not present

## 2018-11-14 DIAGNOSIS — E876 Hypokalemia: Secondary | ICD-10-CM | POA: Diagnosis present

## 2018-11-14 DIAGNOSIS — R4781 Slurred speech: Secondary | ICD-10-CM | POA: Diagnosis not present

## 2018-11-14 DIAGNOSIS — R202 Paresthesia of skin: Secondary | ICD-10-CM | POA: Diagnosis not present

## 2018-11-14 DIAGNOSIS — R0689 Other abnormalities of breathing: Secondary | ICD-10-CM | POA: Diagnosis not present

## 2018-11-14 DIAGNOSIS — I361 Nonrheumatic tricuspid (valve) insufficiency: Secondary | ICD-10-CM | POA: Diagnosis not present

## 2018-11-14 DIAGNOSIS — Z951 Presence of aortocoronary bypass graft: Secondary | ICD-10-CM | POA: Diagnosis not present

## 2018-11-14 DIAGNOSIS — Z9102 Food additives allergy status: Secondary | ICD-10-CM

## 2018-11-14 DIAGNOSIS — R258 Other abnormal involuntary movements: Secondary | ICD-10-CM | POA: Diagnosis not present

## 2018-11-14 DIAGNOSIS — Z7982 Long term (current) use of aspirin: Secondary | ICD-10-CM | POA: Diagnosis not present

## 2018-11-14 DIAGNOSIS — R112 Nausea with vomiting, unspecified: Secondary | ICD-10-CM | POA: Diagnosis not present

## 2018-11-14 DIAGNOSIS — I25110 Atherosclerotic heart disease of native coronary artery with unstable angina pectoris: Secondary | ICD-10-CM | POA: Diagnosis not present

## 2018-11-14 DIAGNOSIS — I252 Old myocardial infarction: Secondary | ICD-10-CM | POA: Diagnosis not present

## 2018-11-14 DIAGNOSIS — I2510 Atherosclerotic heart disease of native coronary artery without angina pectoris: Secondary | ICD-10-CM | POA: Diagnosis present

## 2018-11-14 DIAGNOSIS — R29700 NIHSS score 0: Secondary | ICD-10-CM | POA: Diagnosis present

## 2018-11-14 DIAGNOSIS — I721 Aneurysm of artery of upper extremity: Secondary | ICD-10-CM | POA: Diagnosis not present

## 2018-11-14 DIAGNOSIS — R531 Weakness: Secondary | ICD-10-CM | POA: Diagnosis not present

## 2018-11-14 DIAGNOSIS — I63412 Cerebral infarction due to embolism of left middle cerebral artery: Secondary | ICD-10-CM | POA: Diagnosis not present

## 2018-11-14 DIAGNOSIS — G459 Transient cerebral ischemic attack, unspecified: Secondary | ICD-10-CM | POA: Diagnosis present

## 2018-11-14 DIAGNOSIS — I6782 Cerebral ischemia: Secondary | ICD-10-CM | POA: Diagnosis not present

## 2018-11-14 DIAGNOSIS — I351 Nonrheumatic aortic (valve) insufficiency: Secondary | ICD-10-CM | POA: Diagnosis not present

## 2018-11-14 DIAGNOSIS — R4789 Other speech disturbances: Secondary | ICD-10-CM | POA: Diagnosis not present

## 2018-11-14 DIAGNOSIS — G8191 Hemiplegia, unspecified affecting right dominant side: Secondary | ICD-10-CM | POA: Diagnosis not present

## 2018-11-14 LAB — DIFFERENTIAL
Abs Immature Granulocytes: 0.01 K/uL (ref 0.00–0.07)
Basophils Absolute: 0 K/uL (ref 0.0–0.1)
Basophils Relative: 0 %
Eosinophils Absolute: 0.1 K/uL (ref 0.0–0.5)
Eosinophils Relative: 2 %
Immature Granulocytes: 0 %
Lymphocytes Relative: 38 %
Lymphs Abs: 1.5 K/uL (ref 0.7–4.0)
Monocytes Absolute: 0.3 K/uL (ref 0.1–1.0)
Monocytes Relative: 8 %
Neutro Abs: 2.1 K/uL (ref 1.7–7.7)
Neutrophils Relative %: 52 %

## 2018-11-14 LAB — URINALYSIS, ROUTINE W REFLEX MICROSCOPIC
Bacteria, UA: NONE SEEN
Bilirubin Urine: NEGATIVE
Glucose, UA: NEGATIVE mg/dL
Hgb urine dipstick: NEGATIVE
Ketones, ur: NEGATIVE mg/dL
Nitrite: NEGATIVE
Protein, ur: NEGATIVE mg/dL
Specific Gravity, Urine: 1.009 (ref 1.005–1.030)
pH: 7 (ref 5.0–8.0)

## 2018-11-14 LAB — COMPREHENSIVE METABOLIC PANEL WITH GFR
ALT: 24 U/L (ref 0–44)
AST: 22 U/L (ref 15–41)
Albumin: 4.4 g/dL (ref 3.5–5.0)
Alkaline Phosphatase: 72 U/L (ref 38–126)
Anion gap: 9 (ref 5–15)
BUN: 19 mg/dL (ref 8–23)
CO2: 25 mmol/L (ref 22–32)
Calcium: 9.7 mg/dL (ref 8.9–10.3)
Chloride: 105 mmol/L (ref 98–111)
Creatinine, Ser: 0.82 mg/dL (ref 0.44–1.00)
GFR calc Af Amer: >60 mL/min
GFR calc non Af Amer: >60 mL/min
Glucose, Bld: 110 mg/dL — ABNORMAL HIGH (ref 70–99)
Potassium: 3.2 mmol/L — ABNORMAL LOW (ref 3.5–5.1)
Sodium: 139 mmol/L (ref 135–145)
Total Bilirubin: 0.6 mg/dL (ref 0.3–1.2)
Total Protein: 7.6 g/dL (ref 6.5–8.1)

## 2018-11-14 LAB — RAPID URINE DRUG SCREEN, HOSP PERFORMED
Amphetamines: NOT DETECTED
Barbiturates: NOT DETECTED
Benzodiazepines: NOT DETECTED
Cocaine: NOT DETECTED
Opiates: NOT DETECTED
Tetrahydrocannabinol: NOT DETECTED

## 2018-11-14 LAB — CBC
HCT: 42.1 % (ref 36.0–46.0)
Hemoglobin: 13.5 g/dL (ref 12.0–15.0)
MCH: 29.5 pg (ref 26.0–34.0)
MCHC: 32.1 g/dL (ref 30.0–36.0)
MCV: 92.1 fL (ref 80.0–100.0)
Platelets: 153 K/uL (ref 150–400)
RBC: 4.57 MIL/uL (ref 3.87–5.11)
RDW: 13.9 % (ref 11.5–15.5)
WBC: 4 K/uL (ref 4.0–10.5)
nRBC: 0 % (ref 0.0–0.2)

## 2018-11-14 LAB — I-STAT CREATININE, ED: Creatinine, Ser: 0.8 mg/dL (ref 0.44–1.00)

## 2018-11-14 LAB — APTT: aPTT: 30 seconds (ref 24–36)

## 2018-11-14 LAB — PROTIME-INR
INR: 1 (ref 0.8–1.2)
Prothrombin Time: 13.4 seconds (ref 11.4–15.2)

## 2018-11-14 LAB — MAGNESIUM: Magnesium: 2 mg/dL (ref 1.7–2.4)

## 2018-11-14 LAB — ETHANOL: Alcohol, Ethyl (B): <10 mg/dL (ref <10)

## 2018-11-14 MED ORDER — METOPROLOL TARTRATE 50 MG PO TABS
75.0000 mg | ORAL_TABLET | Freq: Two times a day (BID) | ORAL | Status: DC
  Administered 2018-11-14 – 2018-11-16 (×4): 75 mg via ORAL
  Filled 2018-11-14 (×4): qty 1

## 2018-11-14 MED ORDER — ENOXAPARIN SODIUM 40 MG/0.4ML ~~LOC~~ SOLN
40.0000 mg | SUBCUTANEOUS | Status: DC
  Administered 2018-11-14 – 2018-11-15 (×2): 40 mg via SUBCUTANEOUS
  Filled 2018-11-14 (×2): qty 0.4

## 2018-11-14 MED ORDER — ATORVASTATIN CALCIUM 40 MG PO TABS
40.0000 mg | ORAL_TABLET | Freq: Every day | ORAL | Status: DC
  Administered 2018-11-14 – 2018-11-15 (×2): 40 mg via ORAL
  Filled 2018-11-14 (×2): qty 1

## 2018-11-14 MED ORDER — ASPIRIN EC 81 MG PO TBEC: 81.0000 mg | DELAYED_RELEASE_TABLET | Freq: Every day | ORAL | Status: DC

## 2018-11-14 MED ORDER — HYDROCHLOROTHIAZIDE 25 MG PO TABS
25.0000 mg | ORAL_TABLET | Freq: Every day | ORAL | Status: DC
  Administered 2018-11-15 – 2018-11-16 (×2): 25 mg via ORAL
  Filled 2018-11-14 (×2): qty 1

## 2018-11-14 MED ORDER — POLYETHYLENE GLYCOL 3350 17 G PO PACK: 17.0000 g | PACK | Freq: Every day | ORAL | Status: DC | PRN

## 2018-11-14 MED ORDER — LOSARTAN POTASSIUM 50 MG PO TABS
100.0000 mg | ORAL_TABLET | Freq: Every day | ORAL | Status: DC
  Administered 2018-11-15 – 2018-11-16 (×2): 100 mg via ORAL
  Filled 2018-11-14 (×2): qty 2

## 2018-11-14 MED ORDER — ONDANSETRON HCL 4 MG/2ML IJ SOLN: 4.0000 mg | Freq: Four times a day (QID) | INTRAMUSCULAR | Status: DC | PRN

## 2018-11-14 MED ORDER — LOSARTAN POTASSIUM-HCTZ 100-25 MG PO TABS: 1.0000 | ORAL_TABLET | Freq: Every day | ORAL | Status: DC

## 2018-11-14 MED ORDER — ONDANSETRON HCL 4 MG PO TABS: 4.0000 mg | ORAL_TABLET | Freq: Four times a day (QID) | ORAL | Status: DC | PRN

## 2018-11-14 MED ORDER — POTASSIUM CHLORIDE CRYS ER 20 MEQ PO TBCR
40.0000 meq | EXTENDED_RELEASE_TABLET | ORAL | Status: AC
  Administered 2018-11-14 – 2018-11-15 (×2): 40 meq via ORAL
  Filled 2018-11-14 (×2): qty 2

## 2018-11-14 MED ORDER — ACETAMINOPHEN 325 MG PO TABS
650.0000 mg | ORAL_TABLET | Freq: Four times a day (QID) | ORAL | Status: DC | PRN
  Administered 2018-11-15: 650 mg via ORAL
  Filled 2018-11-14: qty 2

## 2018-11-14 MED ORDER — ACETAMINOPHEN 650 MG RE SUPP: 650.0000 mg | Freq: Four times a day (QID) | RECTAL | Status: DC | PRN

## 2018-11-14 MED ORDER — AMLODIPINE BESYLATE 5 MG PO TABS: 5.0000 mg | ORAL_TABLET | Freq: Every day | ORAL | Status: DC

## 2018-11-14 MED ORDER — CLOPIDOGREL BISULFATE 75 MG PO TABS
75.0000 mg | ORAL_TABLET | Freq: Every day | ORAL | Status: DC
  Administered 2018-11-14 – 2018-11-16 (×3): 75 mg via ORAL
  Filled 2018-11-14 (×3): qty 1

## 2018-11-14 MED ORDER — ASPIRIN EC 81 MG PO TBEC
81.0000 mg | DELAYED_RELEASE_TABLET | Freq: Every day | ORAL | Status: DC
  Administered 2018-11-15 – 2018-11-16 (×2): 81 mg via ORAL
  Filled 2018-11-14 (×3): qty 1

## 2018-11-14 NOTE — H&P (Signed)
History and Physical    [name] [name] KZS:[id number] DOB: [birth year] DOA: 11/14/2018  PCP: Celene Squibb, MD   Patient coming from: Home  I have personally briefly reviewed patient's old medical records in [place]  Chief Complaint: Gabbled speech, Right hand weakness  HPI: [name] [name] is a 78 y.o. female with medical history significant for CAD with CABG x3, HTN, who  presented to the ED with complaints of sudden onset of abnormal speech, and weakness of her right upper extremity.  Patient reports at about 5 PM symptoms started.  She was with her daughter.she had difficulty getting her words out and when he was finally able to vocalize, it sounded like she was "speaking in tongues" as her daughter could not understand what she was saying.  She reports also weakness of her right upper extremity, and numbness of her hand.  He denies weakness of her lower extremities, no change in vision.  No facial asymmetry was noted.  She has never had a stroke.  Symptoms lasted about 20 minutes and resolved on route via EMS. She is compliant with 81 mg of aspirin daily.  She has been following lock down restrictions, she denies chest pain, no difficulty breathing or cough, no flulike symptoms.  No sick/Covid +ve contacts.  ED Course: Blood pressure systolic 732K to 025K.  Potassium 3.2.  Otherwise unremarkable CMP CBC.  CT negative for acute abnormality, shows atrophy and moderate chronic ischemic change.  Tele-Neurologist consulted recommended in-house neurology follow-up.   Review of Systems: As per HPI all other systems reviewed and negative.  Past Medical History:  Diagnosis Date  . CAD (coronary artery disease)    Multivessel, PTCA of posterior descending followed by CABG January 2017  . Essential hypertension   . ST elevation myocardial infarction (STEMI) of inferior wall [facility])    January 2017  . Wound infection after surgery    Sternal wound infection requiring debridement, pectoralis  muscle flap repair, wound [facility] February 2017    Past Surgical History:  Procedure Laterality Date  . ABDOMINAL HYSTERECTOMY    . APPENDECTOMY    . APPLICATION OF WOUND VAC N/A 09/04/2015   Procedure: WOUND VAC CHANGE;  Surgeon: [name], MD;  Location: [place];  Service: Thoracic;  Laterality: N/A;  . BACK SURGERY    . CARDIAC CATHETERIZATION N/A 08/10/2015   Procedure: Left Heart Cath and Coronary Angiography;  Surgeon: [name], MD;  Location: [place] CV LAB;  Service: Cardiovascular;  Laterality: N/A;  . CARDIAC CATHETERIZATION  08/10/2015   Procedure: Coronary Balloon Angioplasty;  Surgeon: [name], MD;  Location: [place] CV LAB;  Service: Cardiovascular;;  . CHEST TUBE INSERTION Left 09/08/2015   Procedure: CHEST TUBE INSERTION;  Surgeon: [name], MD;  Location: [place];  Service: Thoracic;  Laterality: Left;  . COLONOSCOPY N/A 03/29/2013   Procedure: COLONOSCOPY;  Surgeon: Rogene Houston, MD;  [facility];  Service: Endoscopy;  Laterality: N/A;  930  . CORONARY ARTERY BYPASS GRAFT N/A 08/14/2015   Procedure: CORONARY ARTERY BYPASS GRAFTING (CABG)X3 LIMA-LAD; SVG-RAMUS; SVG-PD TRANSESOPHAGEAL ECHOCARDIOGRAM (TEE);  Surgeon: [name], MD;  Location: [place];  Service: Open Heart Surgery;  Laterality: N/A;  . PECTORALIS FLAP N/A 09/08/2015   Procedure: Muscle FLAP;  Surgeon: [name], DO;  Location: [place];  Service: Plastics;  Laterality: N/A;  . STERNAL WOUND DEBRIDEMENT N/A 09/01/2015   Procedure: STERNAL WOUND DEBRIDEMENT;  Surgeon: Collier Salina  Prescott Gum, MD;  Location: [place];  Service: Thoracic;  Laterality: N/A;  . STERNAL WOUND DEBRIDEMENT N/A 09/04/2015   Procedure: STERNAL WOUND DEBRIDEMENT;  Surgeon: [name], MD;  Location: [place];  Service: Thoracic;  Laterality: N/A;  . STERNAL WOUND DEBRIDEMENT N/A 09/08/2015   Procedure: STERNAL WOUND DEBRIDEMENT;  Surgeon: [name], MD;  Location: [place];  Service: Thoracic;   Laterality: N/A;  . TEE WITHOUT CARDIOVERSION N/A 08/14/2015   Procedure: TRANSESOPHAGEAL ECHOCARDIOGRAM (TEE);  Surgeon: [name], MD;  Location: [place];  Service: Open Heart Surgery;  Laterality: N/A;     reports that she has never smoked. She has never used smokeless tobacco. She reports that she does not drink alcohol or use drugs.  Allergies  Allergen Reactions  . Other Hives and Itching    Food Dye Red Dye #40  . Peanut-Containing Drug Products Rash    Mild rash    Family History  Problem Relation Age of Onset  . Colon cancer Neg Hx     Prior to Admission medications   Medication Sig Start Date End Date Taking? Authorizing Provider  amLODipine (NORVASC) 5 MG tablet Take 1 tablet (5 mg total) by mouth at bedtime. 12/21/17 11/14/18 Yes Satira Sark, MD  aspirin EC 81 MG tablet Take 81 mg by mouth daily.   Yes [provider]  atorvastatin (LIPITOR) 40 MG tablet Take 1 tablet (40 mg total) by mouth daily. Patient taking differently: Take 40 mg by mouth every evening.  04/15/16 11/14/18 Yes Satira Sark, MD  losartan-hydrochlorothiazide (HYZAAR) 100-25 MG tablet Take 1 tablet by mouth daily.  03/05/16  Yes [provider]  metoprolol tartrate (LOPRESSOR) 50 MG tablet TAKE 1 AND 1/2 TABLETS(75 MG) BY MOUTH TWICE DAILY Patient taking differently: Take 75 mg by mouth 2 (two) times daily.  04/04/18  Yes Satira Sark, MD    Physical Exam: Vitals:   11/14/18 1900 11/14/18 1930 11/14/18 2000 11/14/18 2030  BP: (!) 155/60 (!) 153/66 (!) 146/61 (!) 145/69  Pulse: 66 65 64 65  Resp: (!) 22 (!) 26 19 (!) 21  SpO2: 99% 99% 97% 99%  Weight:      Height:        Constitutional: NAD, calm, comfortable Vitals:   11/14/18 1900 11/14/18 1930 11/14/18 2000 11/14/18 2030  BP: (!) 155/60 (!) 153/66 (!) 146/61 (!) 145/69  Pulse: 66 65 64 65  Resp: (!) 22 (!) 26 19 (!) 21  SpO2: 99% 99% 97% 99%  Weight:      Height:       Eyes: PERRL, lids and  conjunctivae normal ENMT: Mucous membranes are moist. Posterior pharynx clear of any exudate or lesions.  Neck: normal, supple, no masses, no thyromegaly Respiratory: clear to auscultation bilaterally, Normal respiratory effort. No accessory muscle use.  Cardiovascular: Regular rate and rhythm,  No extremity edema. 2+ pedal pulses.  Abdomen: no tenderness, no masses palpated. No hepatosplenomegaly. Bowel sounds positive.  Musculoskeletal: no clubbing / cyanosis. No joint deformity upper and lower extremities. Good ROM, no contractures. Normal muscle tone.  Skin: no rashes, lesions, ulcers. No induration Neurologic: CN 2-12 grossly intact. Sensation intact, Strength 5/5 in all 4.  Psychiatric: Normal judgment and insight. Alert and oriented x 3. Normal mood.   Labs on Admission: I have personally reviewed following labs and imaging studies  CBC: Recent Labs  Lab 11/14/18 1837  WBC 4.0  NEUTROABS 2.1  HGB 13.5  HCT 42.1  MCV 92.1  PLT 546   Basic Metabolic Panel: Recent Labs  Lab 11/14/18 1837 11/14/18 1851  NA 139  --   K 3.2*  --   CL 105  --   CO2 25  --   GLUCOSE 110*  --   BUN 19  --   CREATININE 0.82 0.80  CALCIUM 9.7  --    Liver Function Tests: Recent Labs  Lab 11/14/18 1837  AST 22  ALT 24  ALKPHOS 72  BILITOT 0.6  PROT 7.6  ALBUMIN 4.4   Coagulation Profile: Recent Labs  Lab 11/14/18 1837  INR 1.0   Urine analysis:    Component Value Date/Time   COLORURINE YELLOW 11/14/2018 1920   APPEARANCEUR CLEAR 11/14/2018 1920   LABSPEC 1.009 11/14/2018 1920   PHURINE 7.0 11/14/2018 1920   GLUCOSEU NEGATIVE 11/14/2018 1920   HGBUR NEGATIVE 11/14/2018 1920   BILIRUBINUR NEGATIVE 11/14/2018 1920   KETONESUR NEGATIVE 11/14/2018 1920   PROTEINUR NEGATIVE 11/14/2018 1920   NITRITE NEGATIVE 11/14/2018 1920   LEUKOCYTESUR SMALL (A) 11/14/2018 1920    Radiological Exams on Admission: Ct Head Code Stroke Wo Contrast  Result Date: 11/14/2018 CLINICAL DATA:   Code stroke.  Right hand tingling and shaking EXAM: CT HEAD WITHOUT CONTRAST TECHNIQUE: Contiguous axial images were obtained from the base of the skull through the vertex without intravenous contrast. COMPARISON:  None. FINDINGS: Brain: Generalized atrophy with chronic microvascular ischemic change in the white matter. Small chronic infarcts in the left cerebellum. Ventricle size normal. Negative for acute infarct, hemorrhage, or mass. Vascular: Extensive atherosclerotic calcification in the carotid and vertebral arteries bilaterally. Negative for hyperdense vessel Skull: Negative Sinuses/Orbits: Negative Other: None ASPECTS ([facility] Stroke Program Early CT Score) - Ganglionic level infarction (caudate, lentiform nuclei, internal capsule, insula, M1-M3 cortex): 7 - Supraganglionic infarction (M4-M6 cortex): 3 Total score (0-10 with 10 being normal): 10 IMPRESSION: 1. No acute intracranial abnormality 2. Atrophy and moderate chronic ischemic change 3. ASPECTS is 10 4. These results were called by telephone at the time of interpretation on 11/14/2018 at 6:56 pm to Dr. Carmin Muskrat , who verbally acknowledged these results. Electronically Signed   By: Franchot Gallo M.D.   On: 11/14/2018 18:57    EKG: Independently reviewed.  Sinus rhythm. No significant change from prior.    Assessment/Plan Active Problems:   TIA (transient ischemic attack)    Abnormal speech right hand weakness- resolved Likely TIA.Marland Kitchen HEad CT negative for acute abnormality.  EKG shows sinus rhythm.  No prior stroke history.  Reports compliance with 81 mg aspirin daily.  Neurology consulted in ED, full stroke work-up recommended and in house neurology consult. - Continue home aspirin 81 mg daily, will add Plavix 75 mg daily -Continue home statins- Atorv 40 daily - Bedside swallow evaluation -  MRI, MRA head and neck - Neurology consult- order placed - carotid dopplers- deffered, MRA neck ordered - Echo - Lipid, Hgba1c   Hypokalemia potassium 3.2.  Likely from HCTZ use. -Replete K, Check Mag, BMP a.m  Hypertension-Systolic140s to 503T. -Continue home metoprolol, resume Norvasc and losartan HCTZ in a.m  CAD with CABG x3- 2017, stable, denies chest pain.  EKG sinus rhythm without significant change from prior.  Follows with Dr. Domenic Polite. -Cont home statins and aspirin.   DVT prophylaxis: Lovenox Code Status: Full Family Communication: None at bedside Disposition Plan: 1-2 days Consults called: Neurology Admission status: Obs tele  Bethena Roys MD [facility]  11/14/2018, 9:56 PM

## 2018-11-14 NOTE — Progress Notes (Signed)
CODE STROKE DOCUMENTATION CALL TIM = 18:33 EXAM STARTED = 1841 EXAM FINISHED=1844 IMAGES SENT TO SOC=1844 EXAM COMPLETED  IN Epic=1845 GRA CALLED=1847

## 2018-11-14 NOTE — Consult Note (Signed)
[facility] TeleNeurology Consult Services   Date of Service:   11/14/2018 18:41:06  Impression:     .  MCA Distribution Infarct     .  Transient Ischemic Attack  Comments/Sign-Out: Most likely left hemisphere TIA noting loss of speech and right upper extremity symptoms. She has a history of cardiovascular disease. She needs to be evaluated with imaging of her cerebral vasculature, MRA of the head and neck. She needs to hemoglobin A1c in fasting lipid profile. In-house neurology to follow.  Mechanism of Stroke: Possible Thromboembolic  Metrics: Last Known Well: 11/14/2018 17:44:00 [facility] Notification Time: 11/14/2018 18:41:06 Arrival Time: 11/14/2018 18:30:00 Stamp Time: 11/14/2018 18:41:06 Time First Login Attempt: 11/14/2018 18:46:00 Video Start Time: 11/14/2018 18:46:00  Symptoms: trouble finding words and right sid3 NIHSS Start Assessment Time: 11/14/2018 18:50:00 Patient is not a candidate for tPA. Patient was not deemed candidate for tPA thrombolytics because of Resolved symptoms (no residual disabling symptoms). Video End Time: 11/14/2018 19:03:11  CT head showed no acute hemorrhage or acute core infarct. CT head was reviewed and results were: No evidence of acute ischemic change, hemorrhage, or mass lesion. There are vaster calcifications in the basal ganglia and also in the basilar arteries.  Clinical Presentation is not Suggestive of Large Vessel Occlusive Disease  Radiologist was not called back for review of advanced imaging because not indicated ED Physician notified of diagnostic impression and management plan on 11/14/2018 19:02:00  Sign Out:     .  Discussed with Emergency Department Provider    ------------------------------------------------------------------------------  History of Present Illness: Patient is [name].  Patient was brought by EMS for symptoms of trouble finding words and right sid10  This  78 year old woman was well until approximately 1744 today when she suddenly had trouble finding words, had right-sided weakness, and numbness of her right hand. She had no facial droop or vision loss. Symptoms have completely resolved.  CT head showed no acute hemorrhage or acute core infarct. CT head was reviewed.  Last seen normal was within 4.5 hours. There is no history of hemorrhagic complications or intracranial hemorrhage. There is no history of Recent Anticoagulants. There is no history of recent major surgery. There is no history of recent stroke.  Examination: BP(169/68), Pulse(69), Blood Glucose(120) 1A: Level of Consciousness - Alert; keenly responsive + 0 1B: Ask Month and Age - Both Questions Right + 0 1C: Blink Eyes & Squeeze Hands - Performs Both Tasks + 0 2: Test Horizontal Extraocular Movements - Normal + 0 3: Test Visual Fields - No Visual Loss + 0 4: Test Facial Palsy (Use Grimace if Obtunded) - Normal symmetry + 0 5A: Test Left Arm Motor Drift - No Drift for 10 Seconds + 0 5B: Test Right Arm Motor Drift - No Drift for 10 Seconds + 0 6A: Test Left Leg Motor Drift - No Drift for 5 Seconds + 0 6B: Test Right Leg Motor Drift - No Drift for 5 Seconds + 0 7: Test Limb Ataxia (FNF/Heel-Shin) - No Ataxia + 0 8: Test Sensation - Normal; No sensory loss + 0 9: Test Language/Aphasia - Normal; No aphasia + 0 10: Test Dysarthria - Normal + 0 11: Test Extinction/Inattention - No abnormality + 0  NIHSS Score: 0  Patient was informed the Neurology Consult would happen via TeleHealth consult by way of interactive audio and video telecommunications and consented to receiving care in this manner.  Due to the immediate potential for life-threatening deterioration due to underlying acute  neurologic illness, I spent 35 minutes providing critical care. This time includes time for face to face visit via telemedicine, review of medical records, imaging studies and discussion of findings  with providers, the patient and/or family.   Dr Cindie Laroche   [facility] [phone]  Case [id number]

## 2018-11-14 NOTE — ED Triage Notes (Signed)
Pt brought to ED via [facility] for abnormal speech and right sided weakness which started at 1745. Per EMS, daughter states was normal at 1744 and then at 1745 symptoms started and pt started having trouble getting words out.

## 2018-11-14 NOTE — ED Provider Notes (Signed)
[facility] EMERGENCY DEPARTMENT Provider Note   CSN: [id number] Arrival date & time: 11/14/18  1830  An emergency department physician performed an initial assessment on this suspected stroke patient at 78.  History   Chief Complaint Chief Complaint  Patient presents with   Code Stroke    HPI [name] [name] is a 78 y.o. female.     HPI Patient presents after acute onset speech difficulty, right arm discoordination. Patient initially states that she is healthy, but clear the patient does have a history of coronary disease, takes aspirin daily. However, she has no history of stroke She was in her usual state of health, when she felt the sudden onset of changes. These changes persisted for most 2 hours, but by the time of my evaluation have resolved entirely, and she states that she feels back to normal. Speech is clear, and should not denies any current weakness, pain. No recent medication change, diet change, activity change.  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/2015   Sternal wound dehiscence 08/31/2015   S/P CABG x 3 08/14/2015   CAD (coronary artery disease), native coronary artery 08/11/2015   CAD (coronary artery disease)    Acute inferior myocardial infarction ([place]) 08/10/2015   Acute MI, inferior wall, initial episode of care [facility])    Essential hypertension 02/19/2015    Past Surgical History:  Procedure Laterality Date   ABDOMINAL HYSTERECTOMY      APPENDECTOMY     APPLICATION OF WOUND VAC N/A 09/04/2015   Procedure: WOUND VAC CHANGE;  Surgeon: [name], MD;  Location: [place];  Service: Thoracic;  Laterality: N/A;   BACK SURGERY     CARDIAC CATHETERIZATION N/A 08/10/2015   Procedure: Left Heart Cath and Coronary Angiography;  Surgeon: [name], MD;  Location: [place] CV LAB;  Service: Cardiovascular;  Laterality: N/A;   CARDIAC CATHETERIZATION  08/10/2015   Procedure: Coronary Balloon Angioplasty;  Surgeon: [name], MD;  Location: [place] CV LAB;  Service: Cardiovascular;;   CHEST TUBE INSERTION Left 09/08/2015   Procedure: CHEST TUBE INSERTION;  Surgeon: [name], MD;  Location: [place];  Service: Thoracic;  Laterality: Left;   COLONOSCOPY N/A 03/29/2013   Procedure: COLONOSCOPY;  Surgeon: Rogene Houston, MD;  [facility];  Service: Endoscopy;  Laterality: N/A;  930   CORONARY ARTERY BYPASS GRAFT N/A 08/14/2015   Procedure: CORONARY ARTERY BYPASS GRAFTING (CABG)X3 LIMA-LAD; SVG-RAMUS; SVG-PD TRANSESOPHAGEAL ECHOCARDIOGRAM (TEE);  Surgeon: [name], MD;  Location: [place];  Service: Open Heart Surgery;  Laterality: N/A;   PECTORALIS FLAP N/A 09/08/2015   Procedure: Muscle FLAP;  Surgeon: [name], DO;  Location: [place];  Service: Plastics;  Laterality: N/A;   STERNAL WOUND DEBRIDEMENT N/A 09/01/2015   Procedure: STERNAL WOUND DEBRIDEMENT;  Surgeon: [name], MD;  Location: [place];  Service: Thoracic;  Laterality: N/A;   STERNAL WOUND DEBRIDEMENT N/A 09/04/2015   Procedure: STERNAL WOUND DEBRIDEMENT;  Surgeon:  [name], MD;  Location: [place];  Service: Thoracic;  Laterality: N/A;   STERNAL WOUND DEBRIDEMENT N/A 09/08/2015   Procedure: STERNAL WOUND DEBRIDEMENT;  Surgeon: [name], MD;  Location: [place];  Service: Thoracic;  Laterality: N/A;   TEE WITHOUT CARDIOVERSION N/A 08/14/2015   Procedure: TRANSESOPHAGEAL ECHOCARDIOGRAM (TEE);  Surgeon: [name], MD;  Location: [place];  Service: Open Heart Surgery;  Laterality: N/A;     OB History   No obstetric history on file.      Home Medications    Prior to Admission medications   Medication Sig Start Date End Date Taking? Authorizing Provider  amLODipine (NORVASC) 5 MG tablet Take 1 tablet (5 mg total) by mouth at bedtime. 12/21/17 10/31/18  Satira Sark, MD  aspirin EC 81 MG tablet Take 81 mg by mouth daily.    [provider]  atorvastatin (LIPITOR) 40 MG tablet Take 1 tablet (40 mg total) by mouth daily. 04/15/16 10/31/18  Satira Sark, MD  diazepam (VALIUM) 5 MG tablet Take 1 tablet (5 mg total) by mouth every 8 (eight) hours as needed for muscle spasms. 08/17/17   Virgel Manifold, MD  HYDROcodone-acetaminophen (NORCO/VICODIN) 5-325 MG tablet Take 1 tablet by mouth every 4 (four) hours as needed. 08/17/17   Virgel Manifold, MD  losartan-hydrochlorothiazide (HYZAAR) 100-25 MG tablet Take 1 tablet by mouth daily.  03/05/16   [provider]  metoprolol tartrate (LOPRESSOR) 50 MG tablet TAKE 1 AND 1/2 TABLETS(75 MG) BY MOUTH TWICE DAILY 04/04/18   Satira Sark, MD  Multiple Vitamin (MULTIVITAMIN) tablet Take 1 tablet by mouth daily.    [provider]    Family History Family History  Problem Relation Age of Onset   Colon cancer Neg Hx     Social History Social History   Tobacco Use   Smoking status: Never Smoker   Smokeless tobacco: Never Used  Substance Use Topics   Alcohol use: No    Alcohol/week: 0.0 standard drinks   Drug use: No     Allergies   Other and Peanut-containing drug products   Review of Systems Review of Systems  Constitutional:       Per HPI, otherwise negative  HENT:       Per HPI, otherwise negative  Respiratory:       Per HPI, otherwise negative  Cardiovascular:       Per HPI, otherwise negative  Gastrointestinal: Negative for vomiting.  Endocrine:       Negative aside from HPI  Genitourinary:        Neg aside from HPI   Musculoskeletal:       Per HPI, otherwise negative  Skin: Negative.   Neurological: Positive for speech difficulty and weakness. Negative for syncope.     Physical Exam Updated Vital Signs BP (!) 155/60    Pulse 66    Resp (!) 22    Ht 5\' 9"  (1.753 m)    Wt 77.1 kg    SpO2 99%    BMI 25.10 kg/m   Physical Exam Vitals signs and nursing note reviewed.  Constitutional:      General: She is not in acute distress.    Appearance: She is well-developed.  HENT:     Head: Normocephalic and atraumatic.  Eyes:     Conjunctiva/sclera: Conjunctivae normal.  Cardiovascular:     Rate and Rhythm: Normal rate and regular rhythm.  Pulmonary:     Effort: Pulmonary effort is  normal. No respiratory distress.     Breath sounds: Normal breath sounds. No stridor.  Abdominal:     General: There is no distension.  Skin:    General: Skin is warm and dry.  Neurological:     General: No focal deficit present.     Mental Status: She is alert and oriented to person, place, and time.     Cranial Nerves: No cranial nerve deficit.     Motor: No weakness.     Coordination: Coordination normal.      ED Treatments / Results  Labs (all labs ordered are listed, but only abnormal results are displayed) Labs Reviewed  PROTIME-INR  APTT  CBC  DIFFERENTIAL  ETHANOL  COMPREHENSIVE METABOLIC PANEL  RAPID URINE DRUG SCREEN, HOSP PERFORMED  URINALYSIS, ROUTINE W REFLEX MICROSCOPIC  I-STAT CREATININE, ED    EKG None  Radiology Ct Head Code Stroke Wo Contrast  Result Date: 11/14/2018 CLINICAL DATA:  Code stroke.  Right hand tingling and shaking EXAM: CT HEAD WITHOUT CONTRAST TECHNIQUE: Contiguous axial images were obtained from the base of the skull through the vertex without intravenous contrast. COMPARISON:  None. FINDINGS: Brain: Generalized atrophy with chronic microvascular ischemic change in the white matter. Small chronic infarcts in the left cerebellum. Ventricle size  normal. Negative for acute infarct, hemorrhage, or mass. Vascular: Extensive atherosclerotic calcification in the carotid and vertebral arteries bilaterally. Negative for hyperdense vessel Skull: Negative Sinuses/Orbits: Negative Other: None ASPECTS ([place] Stroke Program Early CT Score) - Ganglionic level infarction (caudate, lentiform nuclei, internal capsule, insula, M1-M3 cortex): 7 - Supraganglionic infarction (M4-M6 cortex): 3 Total score (0-10 with 10 being normal): 10 IMPRESSION: 1. No acute intracranial abnormality 2. Atrophy and moderate chronic ischemic change 3. ASPECTS is 10 4. These results were called by telephone at the time of interpretation on 11/14/2018 at 6:56 pm to Dr. Carmin Muskrat , who verbally acknowledged these results. Electronically Signed   By: Franchot Gallo M.D.   On: 11/14/2018 18:57    Procedures Procedures (including critical care time)  Medications Ordered in ED Medications - No data to display   Initial Impression / Assessment and Plan / ED Course  I have reviewed the triage vital signs and the nursing notes.  Pertinent labs & imaging results that were available during my care of the patient were reviewed by me and considered in my medical decision making (see chart for details).        7:18 PM On repeat exam the patient is in no distress. I discussed her presentation with our neurologist on-call who has evaluated the patient. With no ongoing neurologic complaints, but with substantial prior deficits, there is suspicion for TIA. Given the patient's episode, risk profile, with history of coronary artery disease, she will require admission for further evaluation, monitoring, management.  Final Clinical Impressions(s) / ED Diagnoses   Final diagnoses:  TIA (transient ischemic attack)     Carmin Muskrat, MD 11/14/18 1918

## 2018-11-15 ENCOUNTER — Observation Stay (HOSPITAL_COMMUNITY): Payer: Medicare PPO

## 2018-11-15 ENCOUNTER — Observation Stay (HOSPITAL_BASED_OUTPATIENT_CLINIC_OR_DEPARTMENT_OTHER): Payer: Medicare PPO

## 2018-11-15 ENCOUNTER — Other Ambulatory Visit (HOSPITAL_COMMUNITY): Payer: Self-pay | Admitting: *Deleted

## 2018-11-15 DIAGNOSIS — Z79899 Other long term (current) drug therapy: Secondary | ICD-10-CM | POA: Diagnosis not present

## 2018-11-15 DIAGNOSIS — I252 Old myocardial infarction: Secondary | ICD-10-CM | POA: Diagnosis not present

## 2018-11-15 DIAGNOSIS — I2510 Atherosclerotic heart disease of native coronary artery without angina pectoris: Secondary | ICD-10-CM | POA: Diagnosis present

## 2018-11-15 DIAGNOSIS — I361 Nonrheumatic tricuspid (valve) insufficiency: Secondary | ICD-10-CM

## 2018-11-15 DIAGNOSIS — R29700 NIHSS score 0: Secondary | ICD-10-CM | POA: Diagnosis present

## 2018-11-15 DIAGNOSIS — I63412 Cerebral infarction due to embolism of left middle cerebral artery: Secondary | ICD-10-CM | POA: Diagnosis present

## 2018-11-15 DIAGNOSIS — I721 Aneurysm of artery of upper extremity: Secondary | ICD-10-CM | POA: Diagnosis not present

## 2018-11-15 DIAGNOSIS — I351 Nonrheumatic aortic (valve) insufficiency: Secondary | ICD-10-CM

## 2018-11-15 DIAGNOSIS — E876 Hypokalemia: Secondary | ICD-10-CM | POA: Diagnosis present

## 2018-11-15 DIAGNOSIS — R471 Dysarthria and anarthria: Secondary | ICD-10-CM | POA: Diagnosis present

## 2018-11-15 DIAGNOSIS — Z7982 Long term (current) use of aspirin: Secondary | ICD-10-CM | POA: Diagnosis not present

## 2018-11-15 DIAGNOSIS — I25110 Atherosclerotic heart disease of native coronary artery with unstable angina pectoris: Secondary | ICD-10-CM | POA: Diagnosis not present

## 2018-11-15 DIAGNOSIS — I639 Cerebral infarction, unspecified: Secondary | ICD-10-CM

## 2018-11-15 DIAGNOSIS — G459 Transient cerebral ischemic attack, unspecified: Secondary | ICD-10-CM | POA: Diagnosis present

## 2018-11-15 DIAGNOSIS — R531 Weakness: Secondary | ICD-10-CM | POA: Diagnosis not present

## 2018-11-15 DIAGNOSIS — R4789 Other speech disturbances: Secondary | ICD-10-CM | POA: Diagnosis not present

## 2018-11-15 DIAGNOSIS — I6782 Cerebral ischemia: Secondary | ICD-10-CM | POA: Diagnosis not present

## 2018-11-15 DIAGNOSIS — I10 Essential (primary) hypertension: Secondary | ICD-10-CM

## 2018-11-15 DIAGNOSIS — Z9102 Food additives allergy status: Secondary | ICD-10-CM | POA: Diagnosis not present

## 2018-11-15 DIAGNOSIS — G8191 Hemiplegia, unspecified affecting right dominant side: Secondary | ICD-10-CM | POA: Diagnosis not present

## 2018-11-15 DIAGNOSIS — Z951 Presence of aortocoronary bypass graft: Secondary | ICD-10-CM | POA: Diagnosis not present

## 2018-11-15 DIAGNOSIS — Z91010 Allergy to peanuts: Secondary | ICD-10-CM | POA: Diagnosis not present

## 2018-11-15 LAB — BASIC METABOLIC PANEL
Anion gap: 9 (ref 5–15)
BUN: 15 mg/dL (ref 8–23)
CO2: 23 mmol/L (ref 22–32)
Calcium: 9.3 mg/dL (ref 8.9–10.3)
Chloride: 108 mmol/L (ref 98–111)
Creatinine, Ser: 0.64 mg/dL (ref 0.44–1.00)
GFR calc Af Amer: >60 mL/min (ref >60)
GFR calc non Af Amer: >60 mL/min (ref >60)
Glucose, Bld: 114 mg/dL — ABNORMAL HIGH (ref 70–99)
Potassium: 3.9 mmol/L (ref 3.5–5.1)
Sodium: 140 mmol/L (ref 135–145)

## 2018-11-15 LAB — HEMOGLOBIN A1C
Hgb A1c MFr Bld: 5.9 % — ABNORMAL HIGH (ref 4.8–5.6)
Mean Plasma Glucose: 122.63 mg/dL

## 2018-11-15 LAB — LIPID PANEL
Cholesterol: 101 mg/dL (ref 0–200)
HDL: 50 mg/dL (ref >40)
LDL Cholesterol: 44 mg/dL (ref 0–99)
Total CHOL/HDL Ratio: 2 RATIO
Triglycerides: 34 mg/dL (ref <150)
VLDL: 7 mg/dL (ref 0–40)

## 2018-11-15 LAB — ECHOCARDIOGRAM COMPLETE BUBBLE STUDY
Height: 69 in
Weight: 2546.75 oz

## 2018-11-15 MED ORDER — AMLODIPINE BESYLATE 5 MG PO TABS
5.0000 mg | ORAL_TABLET | Freq: Every day | ORAL | Status: DC
  Administered 2018-11-15: 22:00:00 5 mg via ORAL
  Filled 2018-11-15: qty 1

## 2018-11-15 MED ORDER — GADOBUTROL 1 MMOL/ML IV SOLN
7.0000 mL | Freq: Once | INTRAVENOUS | Status: AC | PRN
  Administered 2018-11-15: 08:00:00 7 mL via INTRAVENOUS

## 2018-11-15 NOTE — Progress Notes (Signed)
*  PRELIMINARY RESULTS* Echocardiogram 2D Echocardiogram has been performed with a saline bubble study.  Samuel Germany 11/15/2018, 10:26 AM

## 2018-11-15 NOTE — Progress Notes (Addendum)
PROGRESS NOTE    [name] [name]  CWC:[id number]  DOB: 1940/07/30  DOA: 11/14/2018 PCP: Celene Squibb, MD   Brief Admission Hx: 78 year old female presenting with right hand weakness, dysarthria with history of hypertension and CAD status post CABG and admitted with acute CVA.  MDM/Assessment & Plan:   1. Small acute left frontal parietal infarct-patient is being worked up for new CVA with echocardiogram, lipid panel, A1c telemetry monitoring, PT/OT evaluation.  Patient is being seen by our inpatient neurologist.  Further recommendations to follow.  Patient is currently on aspirin and Plavix. 2. Essential hypertension- allowing permissive hypertension in the setting of acute CVA. 3. CAD s/p CABG x3-patient has well-controlled lipids with an LDL of 44 on atorvastatin 40 mg daily.  She is on losartan.  She is on metoprolol. 4. Hypokalemia- this has been repleted.  DVT prophylaxis: lovenox Code Status: Full  Family Communication: patient  Disposition Plan:    Consultants:  Neurology   Procedures:    Antimicrobials:  n/a   Subjective: Pt says that she feels back to her baseline.  She is having no difficulty with her speech at this time.  Her weakness has improved.  Objective: Vitals:   11/15/18 0345 11/15/18 0605 11/15/18 1046 11/15/18 1049  BP: (!) 141/70 (!) 154/62 (!) 136/57 (!) 137/58  Pulse: 60 (!) 55 (!) 54 (!) 55  Resp: 20 20 20 20   Temp: 98.2 F (36.8 C) 98.1 F (36.7 C) 98.7 F (37.1 C) 98 F (36.7 C)  TempSrc: Oral Oral Oral Oral  SpO2: 100% 100% 100% 100%  Weight:      Height:        Intake/Output Summary (Last 24 hours) at 11/15/2018 1117 Last data filed at 11/15/2018 1052 Gross per 24 hour  Intake -  Output 400 ml  Net -400 ml   Filed Weights   11/14/18 1834 11/14/18 2146  Weight: 77.1 kg 72.2 kg     REVIEW OF SYSTEMS  As per history otherwise all reviewed and reported negative  Exam:  General exam: Awake, alert, no apparent distress,  cooperative and pleasant. Respiratory system: Clear. No increased work of breathing. Cardiovascular system: S1 & S2 heard. No JVD, murmurs, gallops, clicks or pedal edema. Gastrointestinal system: Abdomen is nondistended, soft and nontender. Normal bowel sounds heard. Central nervous system: Alert and oriented. No focal neurological deficits. Extremities: no CCE.  Data Reviewed: Basic Metabolic Panel: Recent Labs  Lab 11/14/18 1837 11/14/18 1851 11/15/18 0514  NA 139  --  140  K 3.2*  --  3.9  CL 105  --  108  CO2 25  --  23  GLUCOSE 110*  --  114*  BUN 19  --  15  CREATININE 0.82 0.80 0.64  CALCIUM 9.7  --  9.3  MG 2.0  --   --    Liver Function Tests: Recent Labs  Lab 11/14/18 1837  AST 22  ALT 24  ALKPHOS 72  BILITOT 0.6  PROT 7.6  ALBUMIN 4.4   No results for input(s): LIPASE, AMYLASE in the last 168 hours. No results for input(s): AMMONIA in the last 168 hours. CBC: Recent Labs  Lab 11/14/18 1837  WBC 4.0  NEUTROABS 2.1  HGB 13.5  HCT 42.1  MCV 92.1  PLT 153   Cardiac Enzymes: No results for input(s): CKTOTAL, CKMB, CKMBINDEX, TROPONINI in the last 168 hours. CBG (last 3)  No results for input(s): GLUCAP in the last 72 hours. No results found for  this or any previous visit (from the past 240 hour(s)).   Studies: Mr Virgel Paling QM Contrast  Result Date: 11/15/2018 CLINICAL DATA:  Right hand weakness and speech disturbance, now resolved. EXAM: MRA HEAD WITHOUT CONTRAST TECHNIQUE: Angiographic images of the Circle of Willis were obtained using MRA technique without intravenous contrast. COMPARISON:  None. FINDINGS: Signal loss in the distal V3 and proximal V4 segments is likely artifactual. The more distal V4 segments are patent with additional mild, likely artifactual signal loss in the distal left V4 segment. Patent right PICA, left AICA, and bilateral SCA origins are identified. The basilar artery is widely patent. Posterior communicating arteries are not  identified and may be diminutive or absent. The P1 and proximal P2 segments are widely patent bilaterally. More distal evaluation of the PCAs is limited by suspected artifactual signal loss. Internal carotid arteries are patent from skull base to carotid termini without evidence of significant stenosis. ACAs and MCAs are patent without evidence of proximal branch occlusion or significant proximal stenosis. The right A1 segment is markedly hypoplastic with the right ACA being supplied from the left. No aneurysm is identified. IMPRESSION: No evidence of major intracranial arterial occlusion or significant proximal stenosis. Electronically Signed   By: Logan Bores M.D.   On: 11/15/2018 09:14   Mr Jodene Nam Neck W Wo Contrast  Result Date: 11/15/2018 CLINICAL DATA:  Right upper extremity weakness and speech disturbance, now resolved. EXAM: MRA NECK WITHOUT AND WITH CONTRAST TECHNIQUE: Multiplanar and multiecho pulse sequences of the neck were obtained without and with intravenous contrast. Angiographic images of the neck were obtained using MRA technique without and with intravenous contrast. CONTRAST:  7 mL Gadavist COMPARISON:  Chest CT 08/31/2015 FINDINGS: There is a standard 3 vessel aortic arch. The brachiocephalic and subclavian arteries are widely patent. There is an 8 x 5 mm inferiorly directed irregular outpouching from the mid aspect of the brachiocephalic artery, poorly visualized on the prior chest CT due to non angiographic technique and regional inflammatory changes though not grossly enlarged in the interval. The carotid arteries are patent without evidence of stenosis or dissection. The vertebral arteries are patent and codominant with antegrade flow bilaterally and no evidence of significant stenosis or dissection. IMPRESSION: 1. Widely patent carotid and vertebral arteries. 2. 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 Electronically Signed   By: Logan Bores M.D.   On:  11/15/2018 09:31   Mr Brain Wo Contrast  Result Date: 11/15/2018 CLINICAL DATA:  Right upper extremity weakness and speech disturbance, now resolved. EXAM: MRI HEAD WITHOUT CONTRAST TECHNIQUE: Multiplanar, multiecho pulse sequences of the brain and surrounding structures were obtained without intravenous contrast. COMPARISON:  Head CT 11/14/2018 FINDINGS: Brain: There are several subcentimeter acute cortical and subcortical infarcts in the posterior left frontal lobe and left postcentral gyrus. No intracranial hemorrhage, mass, midline shift, or extra-axial fluid collection is identified. There are small chronic infarcts in the right thalamus and left greater than right cerebellum. Patchy T2 hyperintensities in the cerebral white matter bilaterally are nonspecific but compatible with chronic small vessel ischemic disease, mildly advanced for age. Vascular: Major intracranial vascular flow voids are preserved. Skull and upper cervical spine: Unremarkable bone marrow signal. Sinuses/Orbits: Bilateral cataract extraction. Paranasal sinuses and mastoid air cells are clear. Other: None. IMPRESSION: 1. Small acute left frontoparietal infarcts. 2. Mild chronic small vessel ischemic disease with chronic lacunar infarcts as above. Electronically Signed   By: Logan Bores M.D.   On:  11/15/2018 09:36   Ct Head Code Stroke Wo Contrast  Result Date: 11/14/2018 CLINICAL DATA:  Code stroke.  Right hand tingling and shaking EXAM: CT HEAD WITHOUT CONTRAST TECHNIQUE: Contiguous axial images were obtained from the base of the skull through the vertex without intravenous contrast. COMPARISON:  None. FINDINGS: Brain: Generalized atrophy with chronic microvascular ischemic change in the white matter. Small chronic infarcts in the left cerebellum. Ventricle size normal. Negative for acute infarct, hemorrhage, or mass. Vascular: Extensive atherosclerotic calcification in the carotid and vertebral arteries bilaterally. Negative for  hyperdense vessel Skull: Negative Sinuses/Orbits: Negative Other: None ASPECTS ([place] Stroke Program Early CT Score) - Ganglionic level infarction (caudate, lentiform nuclei, internal capsule, insula, M1-M3 cortex): 7 - Supraganglionic infarction (M4-M6 cortex): 3 Total score (0-10 with 10 being normal): 10 IMPRESSION: 1. No acute intracranial abnormality 2. Atrophy and moderate chronic ischemic change 3. ASPECTS is 10 4. These results were called by telephone at the time of interpretation on 11/14/2018 at 6:56 pm to Dr. Carmin Muskrat , who verbally acknowledged these results. Electronically Signed   By: Franchot Gallo M.D.   On: 11/14/2018 18:57     Scheduled Meds: . amLODipine  5 mg Oral QHS  . aspirin EC  81 mg Oral Daily  . atorvastatin  40 mg Oral q1800  . clopidogrel  75 mg Oral Daily  . enoxaparin (LOVENOX) injection  40 mg Subcutaneous Q24H  . losartan  100 mg Oral Daily   And  . hydrochlorothiazide  25 mg Oral Daily  . metoprolol tartrate  75 mg Oral BID   Continuous Infusions:  Active Problems:   TIA (transient ischemic attack)   Time spent:   Irwin Brakeman, MD [facility] 11/15/2018, 11:17 AM    LOS: 0 days  How to contact the [facility] Attending or Consulting provider [place] or covering provider during after hours [place], for this patient?  1. Check the care team in [facility] and look for a) attending/consulting [facility] provider listed and b) the [facility] team listed 2. Log into [email or web address] and use [place]'s universal password to access. If you do not have the password, please contact the hospital operator. 3. Locate the [facility] provider you are looking for under [facility] and page to a number that you can be directly reached. 4. If you still have difficulty reaching the provider, please page the [facility] (Director on Call) for the Hospitalists listed on amion for assistance.

## 2018-11-15 NOTE — Consult Note (Addendum)
Normanna A. Merlene Laughter, MD     [email or web address]          [name] [name] is an 78 y.o. female.   ASSESSMENT/PLAN: 1.  Acute transient speech impairment and numbness of weakness involving the right side due to multiple small infarcts involving the left hemisphere.  The MRI findings are suggestive of embolic phenomenon.  Consequently, a 30-day event monitor is recommended.  Dual antiplatelet agent is recommended in the meantime.  Continue with statin and blood pressure control.    This is an 78 year old right-handed black female who presents with the acute onset of left-sided numbness, weakness and speaking difficulties lasted for about 10 minutes.  Patient did not lose consciousness.  She denies any alteration of consciousness, dizziness or headaches.  She reports having some headaches today after the MRI.  She does not report having palpitations, chest pain or shortness of breath.  She denies any visual changes.  She typically gets around well and is highly functional.  She has been taking aspirin and has been compliant with this.  She reports being at baseline at this point.  She has had no recurrent episodes.  The review of systems otherwise negative.  GENERAL: This is a very pleasant female who is in no acute distress.  She appears about 5 to 10 years younger than his stated age.  HEENT: Neck is supple no trauma appreciated.  ABDOMEN: soft  EXTREMITIES: No edema   BACK: Normal  SKIN: Normal by inspection.    MENTAL STATUS: Alert and oriented -including the month and her age. Speech, language (including naming, comprehension and fluency) and cognition are generally intact. Judgment and insight normal.   CRANIAL NERVES: Pupils are equal, round and reactive to light and accomodation; extra ocular movements are full, there is no significant nystagmus; visual fields are full; upper and lower facial muscles are normal in strength and symmetric, there is no flattening of  the nasolabial folds; tongue is midline; uvula is midline; shoulder elevation is normal.  MOTOR: Normal tone, bulk and strength; no pronator drift.  There is no drift of the upper or lower extremities.  COORDINATION: Left finger to nose is normal, right finger to nose is normal, No rest tremor; no intention tremor; no postural tremor; no bradykinesia.  REFLEXES: Deep tendon reflexes are symmetrical and normal.    SENSATION: Normal to light touch, temperature, and pain.  NIH stroke scale 0   Blood pressure (!) 148/64, pulse (!) 57, temperature 98.2 F (36.8 C), temperature source Oral, resp. rate 20, height 5\' 9"  (1.753 m), weight 72.2 kg, SpO2 100 %.  Past Medical History:  Diagnosis Date  . CAD (coronary artery disease)    Multivessel, PTCA of posterior descending followed by CABG January 2017  . Essential hypertension   . ST elevation myocardial infarction (STEMI) of inferior wall [facility])    January 2017  . Wound infection after surgery    Sternal wound infection requiring debridement, pectoralis muscle flap repair, wound [facility] February 2017    Past Surgical History:  Procedure Laterality Date  . ABDOMINAL HYSTERECTOMY    . APPENDECTOMY    . APPLICATION OF WOUND VAC N/A 09/04/2015   Procedure: WOUND VAC CHANGE;  Surgeon: [name], MD;  Location: [place];  Service: Thoracic;  Laterality: N/A;  . BACK SURGERY    . CARDIAC CATHETERIZATION N/A 08/10/2015   Procedure: Left Heart Cath and Coronary Angiography;  Surgeon: [name], MD;  Location: [place] [facility];  Service: Cardiovascular;  Laterality: N/A;  . CARDIAC CATHETERIZATION  08/10/2015   Procedure: Coronary Balloon Angioplasty;  Surgeon: [name], MD;  Location: [place] [facility];  Service: Cardiovascular;;  . CHEST TUBE INSERTION Left 09/08/2015   Procedure: CHEST TUBE INSERTION;  Surgeon: [name], MD;  Location: [place];  Service: Thoracic;  Laterality: Left;  . COLONOSCOPY N/A 03/29/2013    Procedure: COLONOSCOPY;  Surgeon: Rogene Houston, MD;  [facility];  Service: Endoscopy;  Laterality: N/A;  930  . CORONARY ARTERY BYPASS GRAFT N/A 08/14/2015   Procedure: CORONARY ARTERY BYPASS GRAFTING (CABG)X3 LIMA-LAD; SVG-RAMUS; SVG-PD TRANSESOPHAGEAL ECHOCARDIOGRAM (TEE);  Surgeon: [name], MD;  Location: [place];  Service: Open Heart Surgery;  Laterality: N/A;  . PECTORALIS FLAP N/A 09/08/2015   Procedure: Muscle FLAP;  Surgeon: [name], DO;  Location: [place];  Service: Plastics;  Laterality: N/A;  . STERNAL WOUND DEBRIDEMENT N/A 09/01/2015   Procedure: STERNAL WOUND DEBRIDEMENT;  Surgeon: [name], MD;  Location: [place];  Service: Thoracic;  Laterality: N/A;  . STERNAL WOUND DEBRIDEMENT N/A 09/04/2015   Procedure: STERNAL WOUND DEBRIDEMENT;  Surgeon: [name], MD;  Location: [place];  Service: Thoracic;  Laterality: N/A;  . STERNAL WOUND DEBRIDEMENT N/A 09/08/2015   Procedure: STERNAL WOUND DEBRIDEMENT;  Surgeon: [name], MD;  Location: [place];  Service: Thoracic;  Laterality: N/A;  . TEE WITHOUT CARDIOVERSION N/A 08/14/2015   Procedure: TRANSESOPHAGEAL ECHOCARDIOGRAM (TEE);  Surgeon: [name], MD;  Location: [place];  Service: Open Heart Surgery;  Laterality: N/A;    Family History  Problem Relation Age of Onset  . Colon cancer Neg Hx     Social History:  reports that she has never smoked. She has never used smokeless tobacco. She reports that she does not drink alcohol or use drugs.  Allergies:  Allergies  Allergen Reactions  . Other Hives and Itching    Food Dye Red Dye #40  . Peanut-Containing Drug Products Rash    Mild rash    Medications: Prior to Admission medications   Medication Sig Start Date End Date Taking? Authorizing Provider  amLODipine (NORVASC) 5 MG tablet Take 1 tablet (5 mg total) by mouth at bedtime. 12/21/17 11/14/18 Yes Satira Sark, MD  aspirin EC 81 MG tablet Take 81 mg by mouth daily.   Yes [provider]  atorvastatin (LIPITOR) 40 MG tablet Take 1 tablet (40 mg total) by mouth daily. Patient taking differently: Take 40 mg by mouth every evening.  04/15/16 11/14/18 Yes Satira Sark, MD  losartan-hydrochlorothiazide (HYZAAR) 100-25 MG tablet Take 1 tablet by mouth daily.  03/05/16  Yes [provider]  metoprolol tartrate (LOPRESSOR) 50 MG tablet TAKE 1 AND 1/2 TABLETS(75 MG) BY MOUTH TWICE DAILY Patient taking differently: Take 75 mg by mouth 2 (two) times daily.  04/04/18  Yes Satira Sark, MD    Scheduled Meds: . amLODipine  5 mg Oral QHS  . aspirin EC  81 mg Oral Daily  . atorvastatin  40 mg Oral q1800  . clopidogrel  75 mg Oral Daily  . enoxaparin (LOVENOX) injection  40 mg Subcutaneous Q24H  . losartan  100 mg Oral Daily   And  . hydrochlorothiazide  25 mg Oral Daily  . metoprolol tartrate  75 mg Oral BID   Continuous Infusions: PRN Meds:.acetaminophen **OR** acetaminophen, ondansetron **OR** ondansetron (ZOFRAN) IV, polyethylene glycol     Results for  orders placed or performed during the hospital encounter of 11/14/18 (from the past 48 hour(s))  Ethanol     Status: None   Collection Time: 11/14/18  6:37 PM  Result Value Ref Range   Alcohol, Ethyl (B) <10 <10 mg/dL    Comment: (NOTE) Lowest detectable limit for serum alcohol is 10 mg/dL. For medical purposes only. Performed at [facility], [address], [place], [place] [address]   Protime-INR     Status: None   Collection Time: 11/14/18  6:37 PM  Result Value Ref Range   Prothrombin Time 13.4 11.4 - 15.2 seconds   INR 1.0 0.8 - 1.2    Comment: (NOTE) INR goal varies based on device and disease states. Performed at [facility], [address], [place], [place] [address]   APTT     Status: None   Collection Time: 11/14/18  6:37 PM  Result Value Ref Range   aPTT 30 24 - 36 seconds    Comment: Performed at [facility], [address] [place], [place] [address]  CBC     Status: None    Collection Time: 11/14/18  6:37 PM  Result Value Ref Range   WBC 4.0 4.0 - 10.5 K/uL   RBC 4.57 3.87 - 5.11 MIL/uL   Hemoglobin 13.5 12.0 - 15.0 g/dL   HCT 42.1 36.0 - 46.0 %   MCV 92.1 80.0 - 100.0 fL   MCH 29.5 26.0 - 34.0 pg   MCHC 32.1 30.0 - 36.0 g/dL   RDW 13.9 11.5 - 15.5 %   Platelets 153 150 - 400 K/uL   nRBC 0.0 0.0 - 0.2 %    Comment: Performed at [facility], [address], [place], [place] [address]  Differential     Status: None   Collection Time: 11/14/18  6:37 PM  Result Value Ref Range   Neutrophils Relative % 52 %   Neutro Abs 2.1 1.7 - 7.7 K/uL   Lymphocytes Relative 38 %   Lymphs Abs 1.5 0.7 - 4.0 K/uL   Monocytes Relative 8 %   Monocytes Absolute 0.3 0.1 - 1.0 K/uL   Eosinophils Relative 2 %   Eosinophils Absolute 0.1 0.0 - 0.5 K/uL   Basophils Relative 0 %   Basophils Absolute 0.0 0.0 - 0.1 K/uL   Immature Granulocytes 0 %   Abs Immature Granulocytes 0.01 0.00 - 0.07 K/uL    Comment: Performed at [facility], [address] [place], [place] [address]  Comprehensive metabolic panel     Status: Abnormal   Collection Time: 11/14/18  6:37 PM  Result Value Ref Range   Sodium 139 135 - 145 mmol/L   Potassium 3.2 (L) 3.5 - 5.1 mmol/L   Chloride 105 98 - 111 mmol/L   CO2 25 22 - 32 mmol/L   Glucose, Bld 110 (H) 70 - 99 mg/dL   BUN 19 8 - 23 mg/dL   Creatinine, Ser 0.82 0.44 - 1.00 mg/dL   Calcium 9.7 8.9 - 10.3 mg/dL   Total Protein 7.6 6.5 - 8.1 g/dL   Albumin 4.4 3.5 - 5.0 g/dL   AST 22 15 - 41 U/L   ALT 24 0 - 44 U/L   Alkaline Phosphatase 72 38 - 126 U/L   Total Bilirubin 0.6 0.3 - 1.2 mg/dL   GFR calc non Af Amer >60 >60 mL/min   GFR calc Af Amer >60 >60 mL/min   Anion gap 9 5 - 15    Comment: Performed  at [facility], [address], [place], [place] [address]  Magnesium     Status: None   Collection Time: 11/14/18  6:37 PM  Result Value Ref Range   Magnesium 2.0 1.7 - 2.4 mg/dL    Comment: Performed at [facility], [address],  [place], [place] [address]  I-stat Creatinine, ED     Status: None   Collection Time: 11/14/18  6:51 PM  Result Value Ref Range   Creatinine, Ser 0.80 0.44 - 1.00 mg/dL  Urine rapid drug screen (hosp performed)     Status: None   Collection Time: 11/14/18  7:20 PM  Result Value Ref Range   Opiates NONE DETECTED NONE DETECTED   Cocaine NONE DETECTED NONE DETECTED   Benzodiazepines NONE DETECTED NONE DETECTED   Amphetamines NONE DETECTED NONE DETECTED   Tetrahydrocannabinol NONE DETECTED NONE DETECTED   Barbiturates NONE DETECTED NONE DETECTED    Comment: (NOTE) DRUG SCREEN FOR MEDICAL PURPOSES ONLY.  IF CONFIRMATION IS NEEDED FOR ANY PURPOSE, NOTIFY LAB WITHIN 5 DAYS. LOWEST DETECTABLE LIMITS FOR URINE DRUG SCREEN Drug Class                     Cutoff (ng/mL) Amphetamine and metabolites    1000 Barbiturate and metabolites    200 Benzodiazepine                 818 Tricyclics and metabolites     300 Opiates and metabolites        300 Cocaine and metabolites        300 THC                            50 Performed at [facility], [address], [place], [place] [address]   Urinalysis, Routine w reflex microscopic     Status: Abnormal   Collection Time: 11/14/18  7:20 PM  Result Value Ref Range   Color, Urine YELLOW YELLOW   APPearance CLEAR CLEAR   Specific Gravity, Urine 1.009 1.005 - 1.030   pH 7.0 5.0 - 8.0   Glucose, UA NEGATIVE NEGATIVE mg/dL   Hgb urine dipstick NEGATIVE NEGATIVE   Bilirubin Urine NEGATIVE NEGATIVE   Ketones, ur NEGATIVE NEGATIVE mg/dL   Protein, ur NEGATIVE NEGATIVE mg/dL   Nitrite NEGATIVE NEGATIVE   Leukocytes,Ua SMALL (A) NEGATIVE   RBC / HPF 0-5 0 - 5 RBC/hpf   WBC, UA 0-5 0 - 5 WBC/hpf   Bacteria, UA NONE SEEN NONE SEEN   Squamous Epithelial / LPF 0-5 0 - 5   Mucus PRESENT     Comment: Performed at [facility], [address] [place], [place] [address]  Basic metabolic panel     Status: Abnormal   Collection Time: 11/15/18  5:14 AM  Result Value  Ref Range   Sodium 140 135 - 145 mmol/L   Potassium 3.9 3.5 - 5.1 mmol/L    Comment: DELTA CHECK NOTED   Chloride 108 98 - 111 mmol/L   CO2 23 22 - 32 mmol/L   Glucose, Bld 114 (H) 70 - 99 mg/dL   BUN 15 8 - 23 mg/dL   Creatinine, Ser 0.64 0.44 - 1.00 mg/dL   Calcium 9.3 8.9 - 10.3 mg/dL   GFR calc non Af Amer >60 >60 mL/min   GFR calc Af Amer >60 >60 mL/min   Anion gap 9 5 - 15    Comment: Performed at [facility], [address], [place], [place] [address]  Lipid panel     Status: None   Collection Time: 11/15/18  5:14 AM  Result Value Ref Range   Cholesterol 101 0 - 200 mg/dL   Triglycerides 34 <150 mg/dL   HDL 50 >40 mg/dL   Total CHOL/HDL Ratio 2.0 RATIO   VLDL 7 0 - 40 mg/dL   LDL Cholesterol 44 0 - 99 mg/dL    Comment:        Total Cholesterol/HDL:CHD Risk Coronary Heart Disease Risk Table                     Men   Women  1/2 Average Risk   3.4   3.3  Average Risk       5.0   4.4  2 X Average Risk   9.6   7.1  3 X Average Risk  23.4   11.0        Use the calculated Patient Ratio above and the CHD Risk Table to determine the patient's CHD Risk.        ATP III CLASSIFICATION (LDL):  <100     mg/dL   Optimal  100-129  mg/dL   Near or Above                    Optimal  130-159  mg/dL   Borderline  160-189  mg/dL   High  >190     mg/dL   Very High Performed at [facility] [place], [place] [address]   Hemoglobin A1c     Status: Abnormal   Collection Time: 11/15/18  5:14 AM  Result Value Ref Range   Hgb A1c MFr Bld 5.9 (H) 4.8 - 5.6 %    Comment: (NOTE) Pre diabetes:          5.7%-6.4% Diabetes:              >6.4% Glycemic control for   <7.0% adults with diabetes    Mean Plasma Glucose 122.63 mg/dL    Comment: Performed at [facility] [address] [place], [place] [address]    Studies/Results:   ECHO  1. The left ventricle has normal systolic function with an ejection fraction of 60-65%. The cavity size was normal. Mild basal  septal hypertrophy. Left ventricular diastolic parameters were normal. No evidence of left ventricular regional wall motion  abnormalities.  2. The mitral valve is grossly normal. There is mild mitral annular calcification present.  3. The tricuspid valve is grossly normal.  4. The aortic valve is tricuspid. Aortic valve regurgitation is mild by color flow Doppler.  5. The aortic root is normal in size and structure.  6. No atrial level shunt detected by color flow Doppler. Agitated saline contrast was given intravenously to evaluate for intracardiac shunting. Saline contrast bubble study was negative, with no evidence of any interatrial shunt.    BRAIN NECK MRA  FINDINGS: There is a standard 3 vessel aortic arch. The brachiocephalic and subclavian arteries are widely patent. There is an 8 x 5 mm inferiorly directed irregular outpouching from the mid aspect of the brachiocephalic artery, poorly visualized on the prior chest CT due to non angiographic technique and regional inflammatory changes though not grossly enlarged in the interval.  The carotid arteries are patent without evidence of stenosis or dissection. The vertebral arteries are patent and codominant with antegrade flow bilaterally and no evidence of significant stenosis or  dissection.  IMPRESSION: 1. Widely patent carotid and vertebral arteries. 2. 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   FINDINGS: Signal loss in the distal V3 and proximal V4 segments is likely artifactual. The more distal V4 segments are patent with additional mild, likely artifactual signal loss in the distal left V4 segment. Patent right PICA, left AICA, and bilateral SCA origins are identified. The basilar artery is widely patent. Posterior communicating arteries are not identified and may be diminutive or absent. The P1 and proximal P2 segments are widely patent bilaterally. More distal evaluation of the  PCAs is limited by suspected artifactual signal loss.  Internal carotid arteries are patent from skull base to carotid termini without evidence of significant stenosis. ACAs and MCAs are patent without evidence of proximal branch occlusion or significant proximal stenosis. The right A1 segment is markedly hypoplastic with the right ACA being supplied from the left. No aneurysm is identified.  IMPRESSION: No evidence of major intracranial arterial occlusion or significant proximal stenosis.     BRAIN MRI FINDINGS: Brain: There are several subcentimeter acute cortical and subcortical infarcts in the posterior left frontal lobe and left postcentral gyrus. No intracranial hemorrhage, mass, midline shift, or extra-axial fluid collection is identified. There are small chronic infarcts in the right thalamus and left greater than right cerebellum. Patchy T2 hyperintensities in the cerebral white matter bilaterally are nonspecific but compatible with chronic small vessel ischemic disease, mildly advanced for age.  Vascular: Major intracranial vascular flow voids are preserved.  Skull and upper cervical spine: Unremarkable bone marrow signal.  Sinuses/Orbits: Bilateral cataract extraction. Paranasal sinuses and mastoid air cells are clear.  Other: None.  IMPRESSION: 1. Small acute left frontoparietal infarcts. 2. Mild chronic small vessel ischemic disease with chronic lacunar infarcts as above.     The brain MRI and MRA are reviewed in person in addition to the MRA of the neck.  There appears to be no significant evidence of intracranial or extracranial occlusive disease.  There is evidence of about 4 small infarcts involving the left frontal lobe and one involving the left parietal lobe.  These are small areas of increased signal seen on DWI but are cortical lesions consistent with a possible embolic phenomenon.  There is moderate periventricular leukoencephalopathy  consistent with microvascular changes seen.  There is evidence of a remote infarct involving the left cerebellum moderate in size with encephalomalacia.  Calloway Andrus A. Merlene Laughter, M.D.  Diplomate, Tax adviser of Psychiatry and Neurology ( Neurology). 11/15/2018, 6:29 PM

## 2018-11-15 NOTE — Evaluation (Signed)
Physical Therapy Evaluation Patient Details Name: [name] [name] MRN: [id number] DOB: [birth year] Today's Date: 11/15/2018   History of Present Illness  [name] [name] is a 78 y.o. female with medical history significant for CAD with CABG x3, HTN, who  presented to the ED with complaints of sudden onset of abnormal speech, and weakness of her right upper extremity.  Patient reports at about 5 PM symptoms started.  She was with her daughter.she had difficulty getting her words out and when he was finally able to vocalize, it sounded like she was "speaking in tongues" as her daughter could not understand what she was saying.  She reports also weakness of her right upper extremity, and numbness of her hand.  He denies weakness of her lower extremities, no change in vision.  No facial asymmetry was noted.  She has never had a stroke.  Symptoms lasted about 20 minutes and resolved on route via EMS.    Clinical Impression  Patient functioning at baseline for functional mobility and gait, see below.  Plan:  Patient discharged from physical therapy to care of nursing for ambulation daily as tolerated for length of stay.     Follow Up Recommendations No PT follow up    Equipment Recommendations  None recommended by PT    Recommendations for Other Services       Precautions / Restrictions Precautions Precautions: None Restrictions Weight Bearing Restrictions: No      Mobility  Bed Mobility Overal bed mobility: Independent                Transfers Overall transfer level: Independent                  Ambulation/Gait Ambulation/Gait assistance: Modified independent (Device/Increase time) Gait Distance (Feet): 200 Feet Assistive device: None Gait Pattern/deviations: WFL(Within Functional Limits) Gait velocity: near normal   General Gait Details: demonstrates good return for ambulation on level, inclined and declined surfaces without loss of balance  Stairs Stairs:  Yes Stairs assistance: Modified independent (Device/Increase time) Stair Management: One rail Left;One rail Right;Alternating pattern Number of Stairs: 18 General stair comments: demonstrates good return for going up/down stairs using 1 siderail without loss of balance  Wheelchair Mobility    Modified Rankin (Stroke Patients Only)       Balance Overall balance assessment: No apparent balance deficits (not formally assessed)                                           Pertinent Vitals/Pain Pain Assessment: No/denies pain    Home Living Family/patient expects to be discharged to:: Private residence Living Arrangements: Children Available Help at Discharge: Family Type of Home: House Home Access: Stairs to enter Entrance Stairs-Rails: Right;Left;Can reach both Entrance Stairs-Number of Steps: 5 Home Layout: Two level;Able to live on main level with bedroom/bathroom Home Equipment: Kasandra Knudsen - single point;Walker - 2 wheels;Grab bars - tub/shower;Bedside commode      Prior Function Level of Independence: Independent               Hand Dominance   Dominant Hand: Right    Extremity/Trunk Assessment   Upper Extremity Assessment Upper Extremity Assessment: Overall WFL for tasks assessed    Lower Extremity Assessment Lower Extremity Assessment: Overall WFL for tasks assessed    Cervical / Trunk Assessment Cervical / Trunk Assessment: Normal  Communication   Communication: No  difficulties  Cognition Arousal/Alertness: Awake/alert Behavior During Therapy: WFL for tasks assessed/performed Overall Cognitive Status: Within Functional Limits for tasks assessed                                        General Comments      Exercises     Assessment/Plan    PT Assessment Patent does not need any further PT services  PT Problem List         PT Treatment Interventions      PT Goals (Current goals can be found in the Care Plan  section)  Acute Rehab PT Goals Patient Stated Goal: return home PT Goal Formulation: With patient Time For Goal Achievement: 11/15/18 Potential to Achieve Goals: Good    Frequency     Barriers to discharge        Co-evaluation               AM-PAC PT "6 Clicks" Mobility  Outcome Measure Help needed turning from your back to your side while in a flat bed without using bedrails?: None Help needed moving from lying on your back to sitting on the side of a flat bed without using bedrails?: None Help needed moving to and from a bed to a chair (including a wheelchair)?: None Help needed standing up from a chair using your arms (e.g., wheelchair or bedside chair)?: None Help needed to walk in hospital room?: None Help needed climbing 3-5 steps with a railing? : None 6 Click Score: 24    End of Session   Activity Tolerance: Patient tolerated treatment well Patient left: in chair;with call bell/phone within reach Nurse Communication: Mobility status PT Visit Diagnosis: Unsteadiness on feet (R26.81);Other abnormalities of gait and mobility (R26.89);Muscle weakness (generalized) (M62.81)    Time: 9093-1121 PT Time Calculation (min) (ACUTE ONLY): 24 min   Charges:   PT Evaluation $PT Eval Moderate Complexity: 1 Mod PT Treatments $Gait Training: 23-37 mins        12:20 PM, 11/15/18 Lonell Grandchild, MPT Physical Therapist with [facility] [phone] office [phone] mobile phone

## 2018-11-15 NOTE — Plan of Care (Signed)
Patient is progressing with care plan and denies any current symptoms.

## 2018-11-15 NOTE — Progress Notes (Signed)
Patient being transported to complete MRA.

## 2018-11-16 ENCOUNTER — Telehealth: Payer: Self-pay | Admitting: *Deleted

## 2018-11-16 DIAGNOSIS — I25110 Atherosclerotic heart disease of native coronary artery with unstable angina pectoris: Secondary | ICD-10-CM

## 2018-11-16 DIAGNOSIS — Z951 Presence of aortocoronary bypass graft: Secondary | ICD-10-CM

## 2018-11-16 DIAGNOSIS — G459 Transient cerebral ischemic attack, unspecified: Secondary | ICD-10-CM

## 2018-11-16 MED ORDER — CLOPIDOGREL BISULFATE 75 MG PO TABS: 75.0000 mg | ORAL_TABLET | Freq: Every day | ORAL | 0 refills | Status: AC

## 2018-11-16 MED ORDER — METOPROLOL TARTRATE 50 MG PO TABS: 75.0000 mg | ORAL_TABLET | Freq: Two times a day (BID) | ORAL | Status: DC

## 2018-11-16 MED ORDER — ATORVASTATIN CALCIUM 40 MG PO TABS: 40.0000 mg | ORAL_TABLET | Freq: Every evening | ORAL | 0 refills | Status: DC

## 2018-11-16 NOTE — Plan of Care (Signed)
Problem: Education: Goal: Knowledge of General Education information will improve Description Including pain rating scale, medication(s)/side effects and non-pharmacologic comfort measures 11/16/2018 1230 by Rance Muir, RN Outcome: Adequate for Discharge 11/16/2018 1044 by Rance Muir, RN Outcome: Progressing   Problem: Health Behavior/Discharge Planning: Goal: Ability to manage health-related needs will improve 11/16/2018 1230 by Rance Muir, RN Outcome: Adequate for Discharge 11/16/2018 1044 by Rance Muir, RN Outcome: Progressing   Problem: Clinical Measurements: Goal: Ability to maintain clinical measurements within normal limits will improve 11/16/2018 1230 by Rance Muir, RN Outcome: Adequate for Discharge 11/16/2018 1044 by Rance Muir, RN Outcome: Progressing Goal: Will remain free from infection 11/16/2018 1230 by Rance Muir, RN Outcome: Adequate for Discharge 11/16/2018 1044 by Rance Muir, RN Outcome: Progressing Goal: Diagnostic test results will improve 11/16/2018 1230 by Rance Muir, RN Outcome: Adequate for Discharge 11/16/2018 1044 by Rance Muir, RN Outcome: Progressing Goal: Respiratory complications will improve 11/16/2018 1230 by Rance Muir, RN Outcome: Adequate for Discharge 11/16/2018 1044 by Rance Muir, RN Outcome: Progressing Goal: Cardiovascular complication will be avoided 11/16/2018 1230 by Rance Muir, RN Outcome: Adequate for Discharge 11/16/2018 1044 by Rance Muir, RN Outcome: Progressing   Problem: Activity: Goal: Risk for activity intolerance will decrease 11/16/2018 1230 by Rance Muir, RN Outcome: Adequate for Discharge 11/16/2018 1044 by Rance Muir, RN Outcome: Progressing   Problem: Nutrition: Goal: Adequate nutrition will be maintained 11/16/2018 1230 by Rance Muir, RN Outcome: Adequate for Discharge 11/16/2018 1044 by Rance Muir, RN Outcome: Progressing   Problem: Coping: Goal: Level of anxiety will decrease 11/16/2018 1230 by Rance Muir, RN Outcome: Adequate for  Discharge 11/16/2018 1044 by Rance Muir, RN Outcome: Progressing   Problem: Elimination: Goal: Will not experience complications related to bowel motility 11/16/2018 1230 by Rance Muir, RN Outcome: Adequate for Discharge 11/16/2018 1044 by Rance Muir, RN Outcome: Progressing Goal: Will not experience complications related to urinary retention 11/16/2018 1230 by Rance Muir, RN Outcome: Adequate for Discharge 11/16/2018 1044 by Rance Muir, RN Outcome: Progressing   Problem: Pain Managment: Goal: General experience of comfort will improve 11/16/2018 1230 by Rance Muir, RN Outcome: Adequate for Discharge 11/16/2018 1044 by Rance Muir, RN Outcome: Progressing   Problem: Safety: Goal: Ability to remain free from injury will improve 11/16/2018 1230 by Rance Muir, RN Outcome: Adequate for Discharge 11/16/2018 1044 by Rance Muir, RN Outcome: Progressing   Problem: Skin Integrity: Goal: Risk for impaired skin integrity will decrease 11/16/2018 1230 by Rance Muir, RN Outcome: Adequate for Discharge 11/16/2018 1044 by Rance Muir, RN Outcome: Progressing   Problem: Education: Goal: Knowledge of disease or condition will improve 11/16/2018 1230 by Rance Muir, RN Outcome: Adequate for Discharge 11/16/2018 1044 by Rance Muir, RN Outcome: Progressing Goal: Knowledge of secondary prevention will improve 11/16/2018 1230 by Rance Muir, RN Outcome: Adequate for Discharge 11/16/2018 1044 by Rance Muir, RN Outcome: Progressing Goal: Knowledge of patient specific risk factors addressed and post discharge goals established will improve 11/16/2018 1230 by Rance Muir, RN Outcome: Adequate for Discharge 11/16/2018 1044 by Rance Muir, RN Outcome: Progressing Goal: Individualized Educational Video(s) 11/16/2018 1230 by Rance Muir, RN Outcome: Adequate for Discharge 11/16/2018 1044 by Rance Muir, RN Outcome: Progressing   Problem: Coping: Goal: Will verbalize positive feelings about self 11/16/2018 1230 by Rance Muir,  RN Outcome: Adequate for Discharge 11/16/2018 1044 by Rance Muir, RN Outcome: Progressing Goal: Will identify appropriate support needs 11/16/2018 1230 by Rance Muir, RN Outcome: Adequate for Discharge 11/16/2018 1044 by Rance Muir, RN Outcome: Progressing  Problem: Health Behavior/Discharge Planning: Goal: Ability to manage health-related needs will improve 11/16/2018 1230 by Rance Muir, RN Outcome: Adequate for Discharge 11/16/2018 1044 by Rance Muir, RN Outcome: Progressing   Problem: Self-Care: Goal: Ability to participate in self-care as condition permits will improve 11/16/2018 1230 by Rance Muir, RN Outcome: Adequate for Discharge 11/16/2018 1044 by Rance Muir, RN Outcome: Progressing Goal: Verbalization of feelings and concerns over difficulty with self-care will improve 11/16/2018 1230 by Rance Muir, RN Outcome: Adequate for Discharge 11/16/2018 1044 by Rance Muir, RN Outcome: Progressing   Problem: Ischemic Stroke/TIA Tissue Perfusion: Goal: Complications of ischemic stroke/TIA will be minimized 11/16/2018 1230 by Rance Muir, RN Outcome: Adequate for Discharge 11/16/2018 1044 by Rance Muir, RN Outcome: Progressing

## 2018-11-16 NOTE — Plan of Care (Signed)
  Problem: Education: Goal: Knowledge of General Education information will improve Description Including pain rating scale, medication(s)/side effects and non-pharmacologic comfort measures Outcome: Progressing   Problem: Health Behavior/Discharge Planning: Goal: Ability to manage health-related needs will improve Outcome: Progressing   Problem: Clinical Measurements: Goal: Ability to maintain clinical measurements within normal limits will improve Outcome: Progressing Goal: Will remain free from infection Outcome: Progressing Goal: Diagnostic test results will improve Outcome: Progressing Goal: Respiratory complications will improve Outcome: Progressing Goal: Cardiovascular complication will be avoided Outcome: Progressing   Problem: Activity: Goal: Risk for activity intolerance will decrease Outcome: Progressing   Problem: Nutrition: Goal: Adequate nutrition will be maintained Outcome: Progressing   Problem: Coping: Goal: Level of anxiety will decrease Outcome: Progressing   Problem: Elimination: Goal: Will not experience complications related to bowel motility Outcome: Progressing Goal: Will not experience complications related to urinary retention Outcome: Progressing   Problem: Pain Managment: Goal: General experience of comfort will improve Outcome: Progressing   Problem: Safety: Goal: Ability to remain free from injury will improve Outcome: Progressing   Problem: Skin Integrity: Goal: Risk for impaired skin integrity will decrease Outcome: Progressing   Problem: Education: Goal: Knowledge of disease or condition will improve Outcome: Progressing Goal: Knowledge of secondary prevention will improve Outcome: Progressing Goal: Knowledge of patient specific risk factors addressed and post discharge goals established will improve Outcome: Progressing Goal: Individualized Educational Video(s) Outcome: Progressing   Problem: Coping: Goal: Will verbalize  positive feelings about self Outcome: Progressing Goal: Will identify appropriate support needs Outcome: Progressing   Problem: Health Behavior/Discharge Planning: Goal: Ability to manage health-related needs will improve Outcome: Progressing   Problem: Self-Care: Goal: Ability to participate in self-care as condition permits will improve Outcome: Progressing Goal: Verbalization of feelings and concerns over difficulty with self-care will improve Outcome: Progressing   Problem: Ischemic Stroke/TIA Tissue Perfusion: Goal: Complications of ischemic stroke/TIA will be minimized Outcome: Progressing

## 2018-11-16 NOTE — Discharge Summary (Addendum)
Physician Discharge Summary  [name] [name] ZMO:[id number] DOB: 11-24-1940 DOA: 11/14/2018  PCP: Celene Squibb, MD Neurologist: Merlene Laughter  Admit date: 11/14/2018 Discharge date: 11/16/2018  Admitted From: Home Disposition: Home  Recommendations for Outpatient Follow-up:  1. Follow up with PCP in 1 weeks 2. Follow-up with neurology in 4 weeks 3. 30-day event monitor arranged prior to discharge  Discharge Condition: STABLE   CODE STATUS: FULL    Brief Hospitalization Summary: Please see all hospital notes, images, labs for full details of the hospitalization. HPI: [name] [name] is a 78 y.o. female with medical history significant for CAD with CABG x3, HTN, who  presented to the ED with complaints of sudden onset of abnormal speech, and weakness of her right upper extremity.  Patient reports at about 5 PM symptoms started.  She was with her daughter.she had difficulty getting her words out and when he was finally able to vocalize, it sounded like she was "speaking in tongues" as her daughter could not understand what she was saying.  She reports also weakness of her right upper extremity, and numbness of her hand.  He denies weakness of her lower extremities, no change in vision.  No facial asymmetry was noted.  She has never had a stroke.  Symptoms lasted about 20 minutes and resolved on route via EMS. She is compliant with 81 mg of aspirin daily.  She has been following lock down restrictions, she denies chest pain, no difficulty breathing or cough, no flulike symptoms.  No sick/Covid +ve contacts.  ED Course: Blood pressure systolic 035W to 656C.  Potassium 3.2.  Otherwise unremarkable CMP CBC.  CT negative for acute abnormality, shows atrophy and moderate chronic ischemic change.  Tele-Neurologist consulted recommended in-house neurology follow-up.   Echocardiogram IMPRESSIONS  1. The left ventricle has normal systolic function with an ejection fraction of 60-65%. The cavity size was  normal. Mild basal septal hypertrophy. Left ventricular diastolic parameters were normal. No evidence of left ventricular regional wall motion  abnormalities.  2. The mitral valve is grossly normal. There is mild mitral annular calcification present.  3. The tricuspid valve is grossly normal.  4. The aortic valve is tricuspid. Aortic valve regurgitation is mild by color flow Doppler.  5. The aortic root is normal in size and structure.  6. No atrial level shunt detected by color flow Doppler. Agitated saline contrast was given intravenously to evaluate for intracardiac shunting. Saline contrast bubble study was negative, with no evidence of any interatrial shunt.  A1c - 5.9% LDL - 44  ACUTE EMBOLIC CVA - The patient was admitted with acute transient speech impairment and numbness and weakness of the right side due to multiple small infarcts involving the left hemisphere.  MRI was suggestive of embolic phenomenon.  Neurology saw the patient and recommended a 30-day event monitor which is being arranged.  The patient was recommended to be on dual antiplatelet meant therapy along with blood pressure control and statin therapy.  The patient should follow-up with neurology in 4 weeks.  The patient should follow-up with PCP for recheck in 1 week.  The patient's echocardiogram is noted above.  LDL optimally controlled and A1c 5.9%.  The patient was seen by PT who had no follow-up recommendations.   Discharge Diagnoses:  Principal Problem:   Acute CVA (cerebrovascular accident) [facility]) Active Problems:   Essential hypertension   CAD (coronary artery disease), native coronary artery   CAD (coronary artery disease)   S/P CABG x 3  TIA (transient ischemic attack)   Discharge Instructions: Discharge Instructions    Call MD for:  difficulty breathing, headache or visual disturbances   Complete by:  As directed    Call MD for:  extreme fatigue   Complete by:  As directed    Call MD for:  persistant  dizziness or light-headedness   Complete by:  As directed    Diet - low sodium heart healthy   Complete by:  As directed    Increase activity slowly   Complete by:  As directed      Allergies as of 11/16/2018      Reactions   Other Hives, Itching   Food Dye Red Dye #40   Peanut-containing Drug Products Rash   Mild rash      Medication List    TAKE these medications   amLODipine 5 MG tablet Commonly known as:  NORVASC Take 1 tablet (5 mg total) by mouth at bedtime.   aspirin EC 81 MG tablet Take 81 mg by mouth daily.   atorvastatin 40 MG tablet Commonly known as:  LIPITOR Take 1 tablet (40 mg total) by mouth every evening for 30 days.   clopidogrel 75 MG tablet Commonly known as:  PLAVIX Take 1 tablet (75 mg total) by mouth daily for 30 days. Start taking on:  November 17, 2018   losartan-hydrochlorothiazide 100-25 MG tablet Commonly known as:  HYZAAR Take 1 tablet by mouth daily.   metoprolol tartrate 50 MG tablet Commonly known as:  LOPRESSOR Take 1.5 tablets (75 mg total) by mouth 2 (two) times daily. What changed:  See the new instructions.      Follow-up Information    Celene Squibb, MD. Schedule an appointment as soon as possible for a visit in 1 week(s).   Specialty:  Internal Medicine Why:  Hospital Follow UP  Contact information: [facility] [address] [phone]        Phillips Odor, MD. Schedule an appointment as soon as possible for a visit in 1 month(s).   Specialty:  Neurology Why:  Hospital Follow up Stroke Contact information: [address] [place] [address] [phone]          Allergies  Allergen Reactions  . Other Hives and Itching    Food Dye Red Dye #40  . Peanut-Containing Drug Products Rash    Mild rash   Allergies as of 11/16/2018      Reactions   Other Hives, Itching   Food Dye Red Dye #40   Peanut-containing Drug Products Rash   Mild rash      Medication List    TAKE these  medications   amLODipine 5 MG tablet Commonly known as:  NORVASC Take 1 tablet (5 mg total) by mouth at bedtime.   aspirin EC 81 MG tablet Take 81 mg by mouth daily.   atorvastatin 40 MG tablet Commonly known as:  LIPITOR Take 1 tablet (40 mg total) by mouth every evening for 30 days.   clopidogrel 75 MG tablet Commonly known as:  PLAVIX Take 1 tablet (75 mg total) by mouth daily for 30 days. Start taking on:  November 17, 2018   losartan-hydrochlorothiazide 100-25 MG tablet Commonly known as:  HYZAAR Take 1 tablet by mouth daily.   metoprolol tartrate 50 MG tablet Commonly known as:  LOPRESSOR Take 1.5 tablets (75 mg total) by mouth 2 (two) times daily. What changed:  See the new instructions.  Procedures/Studies: Mr Virgel Paling MV Contrast  Result Date: 11/15/2018 CLINICAL DATA:  Right hand weakness and speech disturbance, now resolved. EXAM: MRA HEAD WITHOUT CONTRAST TECHNIQUE: Angiographic images of the Circle of Willis were obtained using MRA technique without intravenous contrast. COMPARISON:  None. FINDINGS: Signal loss in the distal V3 and proximal V4 segments is likely artifactual. The more distal V4 segments are patent with additional mild, likely artifactual signal loss in the distal left V4 segment. Patent right PICA, left AICA, and bilateral SCA origins are identified. The basilar artery is widely patent. Posterior communicating arteries are not identified and may be diminutive or absent. The P1 and proximal P2 segments are widely patent bilaterally. More distal evaluation of the PCAs is limited by suspected artifactual signal loss. Internal carotid arteries are patent from skull base to carotid termini without evidence of significant stenosis. ACAs and MCAs are patent without evidence of proximal branch occlusion or significant proximal stenosis. The right A1 segment is markedly hypoplastic with the right ACA being supplied from the left. No aneurysm is identified.  IMPRESSION: No evidence of major intracranial arterial occlusion or significant proximal stenosis. Electronically Signed   By: Logan Bores M.D.   On: 11/15/2018 09:14   Mr Jodene Nam Neck W Wo Contrast  Result Date: 11/15/2018 CLINICAL DATA:  Right upper extremity weakness and speech disturbance, now resolved. EXAM: MRA NECK WITHOUT AND WITH CONTRAST TECHNIQUE: Multiplanar and multiecho pulse sequences of the neck were obtained without and with intravenous contrast. Angiographic images of the neck were obtained using MRA technique without and with intravenous contrast. CONTRAST:  7 mL Gadavist COMPARISON:  Chest CT 08/31/2015 FINDINGS: There is a standard 3 vessel aortic arch. The brachiocephalic and subclavian arteries are widely patent. There is an 8 x 5 mm inferiorly directed irregular outpouching from the mid aspect of the brachiocephalic artery, poorly visualized on the prior chest CT due to non angiographic technique and regional inflammatory changes though not grossly enlarged in the interval. The carotid arteries are patent without evidence of stenosis or dissection. The vertebral arteries are patent and codominant with antegrade flow bilaterally and no evidence of significant stenosis or dissection. IMPRESSION: 1. Widely patent carotid and vertebral arteries. 2. 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 Electronically Signed   By: Logan Bores M.D.   On: 11/15/2018 09:31   Mr Brain Wo Contrast  Result Date: 11/15/2018 CLINICAL DATA:  Right upper extremity weakness and speech disturbance, now resolved. EXAM: MRI HEAD WITHOUT CONTRAST TECHNIQUE: Multiplanar, multiecho pulse sequences of the brain and surrounding structures were obtained without intravenous contrast. COMPARISON:  Head CT 11/14/2018 FINDINGS: Brain: There are several subcentimeter acute cortical and subcortical infarcts in the posterior left frontal lobe and left postcentral gyrus. No intracranial  hemorrhage, mass, midline shift, or extra-axial fluid collection is identified. There are small chronic infarcts in the right thalamus and left greater than right cerebellum. Patchy T2 hyperintensities in the cerebral white matter bilaterally are nonspecific but compatible with chronic small vessel ischemic disease, mildly advanced for age. Vascular: Major intracranial vascular flow voids are preserved. Skull and upper cervical spine: Unremarkable bone marrow signal. Sinuses/Orbits: Bilateral cataract extraction. Paranasal sinuses and mastoid air cells are clear. Other: None. IMPRESSION: 1. Small acute left frontoparietal infarcts. 2. Mild chronic small vessel ischemic disease with chronic lacunar infarcts as above. Electronically Signed   By: Logan Bores M.D.   On: 11/15/2018 09:36   Ct Head Code Stroke Wo Contrast  Result  Date: 11/14/2018 CLINICAL DATA:  Code stroke.  Right hand tingling and shaking EXAM: CT HEAD WITHOUT CONTRAST TECHNIQUE: Contiguous axial images were obtained from the base of the skull through the vertex without intravenous contrast. COMPARISON:  None. FINDINGS: Brain: Generalized atrophy with chronic microvascular ischemic change in the white matter. Small chronic infarcts in the left cerebellum. Ventricle size normal. Negative for acute infarct, hemorrhage, or mass. Vascular: Extensive atherosclerotic calcification in the carotid and vertebral arteries bilaterally. Negative for hyperdense vessel Skull: Negative Sinuses/Orbits: Negative Other: None ASPECTS ([place] Stroke Program Early CT Score) - Ganglionic level infarction (caudate, lentiform nuclei, internal capsule, insula, M1-M3 cortex): 7 - Supraganglionic infarction (M4-M6 cortex): 3 Total score (0-10 with 10 being normal): 10 IMPRESSION: 1. No acute intracranial abnormality 2. Atrophy and moderate chronic ischemic change 3. ASPECTS is 10 4. These results were called by telephone at the time of interpretation on 11/14/2018 at 6:56 pm  to Dr. Carmin Muskrat , who verbally acknowledged these results. Electronically Signed   By: Franchot Gallo M.D.   On: 11/14/2018 18:57      Subjective: Pt reports that she is feeling much better.  She is having no difficulty speaking.  She is having no difficulty swallowing.  She reports no changes in vision.  She denies weakness in the extremities.  Discharge Exam: Vitals:   11/16/18 0612 11/16/18 1000  BP: (!) 142/72 139/81  Pulse: (!) 56 62  Resp:  19  Temp: 98.3 F (36.8 C) 97.6 F (36.4 C)  SpO2: 100% 100%   Vitals:   11/15/18 2201 11/16/18 0204 11/16/18 0612 11/16/18 1000  BP: (!) 145/63 133/71 (!) 142/72 139/81  Pulse: 63 (!) 59 (!) 56 62  Resp:    19  Temp: 98.2 F (36.8 C) 98.5 F (36.9 C) 98.3 F (36.8 C) 97.6 F (36.4 C)  TempSrc: Oral Oral Oral Oral  SpO2: 100% 93% 100% 100%  Weight:      Height:        General: Pt is alert, awake, not in acute distress Cardiovascular: RRR, S1/S2 +, no rubs, no gallops Respiratory: CTA bilaterally, no wheezing, no rhonchi Abdominal: Soft, NT, ND, bowel sounds + Extremities: no edema, no cyanosis   The results of significant diagnostics from this hospitalization (including imaging, microbiology, ancillary and laboratory) are listed below for reference.     Microbiology: No results found for this or any previous visit (from the past 240 hour(s)).   Labs: BNP (last 3 results) No results for input(s): BNP in the last 8760 hours. Basic Metabolic Panel: Recent Labs  Lab 11/14/18 1837 11/14/18 1851 11/15/18 0514  NA 139  --  140  K 3.2*  --  3.9  CL 105  --  108  CO2 25  --  23  GLUCOSE 110*  --  114*  BUN 19  --  15  CREATININE 0.82 0.80 0.64  CALCIUM 9.7  --  9.3  MG 2.0  --   --    Liver Function Tests: Recent Labs  Lab 11/14/18 1837  AST 22  ALT 24  ALKPHOS 72  BILITOT 0.6  PROT 7.6  ALBUMIN 4.4   No results for input(s): LIPASE, AMYLASE in the last 168 hours. No results for input(s): AMMONIA in  the last 168 hours. CBC: Recent Labs  Lab 11/14/18 1837  WBC 4.0  NEUTROABS 2.1  HGB 13.5  HCT 42.1  MCV 92.1  PLT 153   Cardiac Enzymes: No results for input(s): CKTOTAL, CKMB,  CKMBINDEX, TROPONINI in the last 168 hours. BNP: Invalid input(s): POCBNP CBG: No results for input(s): GLUCAP in the last 168 hours. D-Dimer No results for input(s): DDIMER in the last 72 hours. Hgb A1c Recent Labs    11/15/18 0514  HGBA1C 5.9*   Lipid Profile Recent Labs    11/15/18 0514  CHOL 101  HDL 50  LDLCALC 44  TRIG 34  CHOLHDL 2.0   Thyroid function studies No results for input(s): TSH, T4TOTAL, T3FREE, THYROIDAB in the last 72 hours.  Invalid input(s): FREET3 Anemia work up No results for input(s): VITAMINB12, FOLATE, FERRITIN, TIBC, IRON, RETICCTPCT in the last 72 hours. Urinalysis    Component Value Date/Time   COLORURINE YELLOW 11/14/2018 1920   APPEARANCEUR CLEAR 11/14/2018 1920   LABSPEC 1.009 11/14/2018 1920   PHURINE 7.0 11/14/2018 1920   GLUCOSEU NEGATIVE 11/14/2018 1920   HGBUR NEGATIVE 11/14/2018 1920   BILIRUBINUR NEGATIVE 11/14/2018 1920   KETONESUR NEGATIVE 11/14/2018 1920   PROTEINUR NEGATIVE 11/14/2018 1920   NITRITE NEGATIVE 11/14/2018 1920   LEUKOCYTESUR SMALL (A) 11/14/2018 1920   Sepsis Labs Invalid input(s): PROCALCITONIN,  WBC,  LACTICIDVEN Microbiology No results found for this or any previous visit (from the past 240 hour(s)).  Time coordinating discharge: 31 minutes   SIGNED:  Irwin Brakeman, MD  [facility] 11/16/2018, 11:43 AM How to contact the [facility] Attending or Consulting provider [place] or covering provider during after hours [place], for this patient?  1. Check the care team in [facility] and look for a) attending/consulting [facility] provider listed and b) the [facility] team listed 2. Log into [email or web address] and use [place]'s universal password to access. If you do not have the password, please contact the hospital operator. 3. Locate the  [facility] provider you are looking for under [facility] and page to a number that you can be directly reached. 4. If you still have difficulty reaching the provider, please page the [facility] (Director on Call) for the Hospitalists listed on amion for assistance.

## 2018-11-16 NOTE — Discharge Instructions (Signed)
Please wear 30 day event monitor as part of your stroke work up    IMPORTANT INFORMATION: [place]  Follow with Primary care provider  Celene Squibb, MD  and other consultants as instructed your Hospitalist Physician  [place] IF SYMPTOMS COME BACK, WORSEN OR NEW PROBLEM DEVELOPS.   Please note: You were cared for by a hospitalist during your hospital stay. Every effort will be made to forward records to your primary care provider.  You can request that your primary care provider send for your hospital records if they have not received them.  Once you are discharged, your primary care physician will handle any further medical issues. Please note that NO REFILLS for any discharge medications will be authorized once you are discharged, as it is imperative that you return to your primary care physician (or establish a relationship with a primary care physician if you do not have one) for your post hospital discharge needs so that they can reassess your need for medications and monitor your lab values.  Please get a complete blood count and chemistry panel checked by your Primary MD at your next visit, and again as instructed by your Primary MD.  Get Medicines reviewed and adjusted: Please take all your medications with you for your next visit with your Primary MD  Laboratory/radiological data: Please request your Primary MD to go over all hospital tests and procedure/radiological results at the follow up, please ask your primary care provider to get all Hospital records sent to his/her office.  In some cases, they will be blood work, cultures and biopsy results pending at the time of your discharge. Please request that your primary care provider follow up on these results.  If you are diabetic, please bring your blood sugar readings with you to your follow up appointment with primary care.    Please call and make  your follow up appointments as soon as possible.    Also Note the following: If you experience worsening of your admission symptoms, develop shortness of breath, life threatening emergency, suicidal or homicidal thoughts you must seek medical attention immediately by calling 911 or calling your MD immediately  if symptoms less severe.  You must read complete instructions/literature along with all the possible adverse reactions/side effects for all the Medicines you take and that have been prescribed to you. Take any new Medicines after you have completely understood and accpet all the possible adverse reactions/side effects.   Do not drive when taking Pain medications or sleeping medications (Benzodiazepines)  Do not take more than prescribed Pain, Sleep and Anxiety Medications. It is not advisable to combine anxiety,sleep and pain medications without talking with your primary care practitioner  Special Instructions: If you have smoked or chewed Tobacco  in the last 2 yrs please stop smoking, stop any regular Alcohol  and or any Recreational drug use.  Wear Seat belts while driving.

## 2018-11-16 NOTE — Telephone Encounter (Signed)
-----   Message from Murlean Iba, MD sent at 11/16/2018  7:59 AM EDT ----- Regarding: 30 day event monitor Dear Karen Kays,  Would you set this inpatient up for a 30 day event monitor as part of her stroke work up?  Thanks for all of your help.    Murvin Natal MD  [facility]

## 2018-11-17 DIAGNOSIS — E782 Mixed hyperlipidemia: Secondary | ICD-10-CM | POA: Diagnosis not present

## 2018-11-17 DIAGNOSIS — I872 Venous insufficiency (chronic) (peripheral): Secondary | ICD-10-CM | POA: Diagnosis not present

## 2018-11-17 DIAGNOSIS — I2510 Atherosclerotic heart disease of native coronary artery without angina pectoris: Secondary | ICD-10-CM | POA: Diagnosis not present

## 2018-11-17 DIAGNOSIS — D696 Thrombocytopenia, unspecified: Secondary | ICD-10-CM | POA: Diagnosis not present

## 2018-11-17 DIAGNOSIS — R531 Weakness: Secondary | ICD-10-CM | POA: Diagnosis not present

## 2018-11-17 DIAGNOSIS — I677 Cerebral arteritis, not elsewhere classified: Secondary | ICD-10-CM | POA: Diagnosis not present

## 2018-11-17 DIAGNOSIS — I10 Essential (primary) hypertension: Secondary | ICD-10-CM | POA: Diagnosis not present

## 2018-11-17 DIAGNOSIS — I6782 Cerebral ischemia: Secondary | ICD-10-CM | POA: Diagnosis not present

## 2018-11-17 DIAGNOSIS — R7301 Impaired fasting glucose: Secondary | ICD-10-CM | POA: Diagnosis not present

## 2018-11-21 ENCOUNTER — Telehealth: Payer: Self-pay | Admitting: Cardiology

## 2018-11-21 NOTE — Telephone Encounter (Signed)
Pt was admitted at [facility] last week, they changed her medication to clopidogrel (PLAVIX) 75 MG tablet [[id number]] w/ Aspirin she wanted Dr. Domenic Polite to be aware.

## 2018-11-21 NOTE — Telephone Encounter (Signed)
Will send to Dr. Domenic Polite as an Juluis Rainier.

## 2018-11-22 ENCOUNTER — Other Ambulatory Visit: Payer: Self-pay

## 2018-11-22 NOTE — Patient Outreach (Signed)
[facility]) Care Management  11/22/2018  [name] [name] 21-Sep-1940 [id number]  EMMI: General discharge red alert Referral date: 11/22/18 Referral reason: Lost interest in things: yes Insurance:  [employer] Day # 4   Telephone call to patient regarding EMMI general discharge red alert. HIPAA verified with patient. RNCM introduced herself and explained reason for call.  Patient states she is missing the fact she cannot go to the senior center to work out like she use to do to the COVID 19 virus.  Patient states she does not need to talk with anyone regarding this.  She feels this will pass.  Patient states she had a telehealth call with her primary MD on 11/13/18 and has a tele health call with the neurologist on 12/19/18.   Patient reports she contacted her cardiologist office on yesterday and she is waiting to hear back from the doctor. Patient states she has been ordered a heart monitor with leads that should arrive soon. Patient states she will be able to apply the monitor with the help of her daughter. She states she had one of these monitors before and she was able to manage.  Patient denies any home health services. Patient reports she is taking her medications as prescribed. She denies any new or ongoing symptoms.  Patient states she has someone checking on her and assisting her with getting groceries when needed. Patient states she has transportation to get to the doctors if needed.  Patient denies any further concerns at this time. She expressed appreciation for follow up call.  RNCM discussed COVID 19 precautions and symptoms. Advised patient to contact her doctor for minor symptoms. If symptoms more severe call 911.  Patient verbalized understanding.  RNCM advised patient to notify MD of any changes in condition prior to scheduled appointment. RNCM provided contact name and number:  24 hour nurse advise line [phone].  RNCM verified patient aware of 911 services for urgent/  emergent needs.  PLAN: RNCM will close patient due to being assessed and having no further needs.   Quinn Plowman RN,BSN,CCM [facility] Telephonic  [phone]      PLAN:

## 2018-11-24 ENCOUNTER — Ambulatory Visit (INDEPENDENT_AMBULATORY_CARE_PROVIDER_SITE_OTHER): Payer: Medicare PPO

## 2018-11-24 DIAGNOSIS — G459 Transient cerebral ischemic attack, unspecified: Secondary | ICD-10-CM | POA: Diagnosis not present

## 2018-11-24 DIAGNOSIS — R002 Palpitations: Secondary | ICD-10-CM | POA: Diagnosis not present

## 2018-11-24 NOTE — Telephone Encounter (Signed)
Patient called stating that she has not heard from the office in regards to a telephone message that was left on 11/21/2018.

## 2018-11-24 NOTE — Telephone Encounter (Signed)
Pt notified that Dr.McDowell made aware. Pt voiced understanding.

## 2018-11-27 ENCOUNTER — Other Ambulatory Visit: Payer: Self-pay

## 2018-12-01 ENCOUNTER — Telehealth: Payer: Self-pay | Admitting: Cardiology

## 2018-12-01 NOTE — Telephone Encounter (Signed)
[facility] pharmacist informed.

## 2018-12-01 NOTE — Telephone Encounter (Signed)
I looked at the chart.  This was started by Dr. Merlene Laughter, ideally he should refill it.

## 2018-12-01 NOTE — Telephone Encounter (Signed)
°*  STAT* If patient is at the pharmacy, call can be transferred to refill team.   1. Which medications need to be refilled? clopidogrel (PLAVIX) 75 MG tablet    2. Which pharmacy/location (including street and city if local pharmacy) is medication to be sent to? [facility] in [place]   3. Do they need a 30 day or 90 day supply?

## 2018-12-08 DIAGNOSIS — Z0000 Encounter for general adult medical examination without abnormal findings: Secondary | ICD-10-CM | POA: Diagnosis not present

## 2018-12-19 DIAGNOSIS — E1149 Type 2 diabetes mellitus with other diabetic neurological complication: Secondary | ICD-10-CM | POA: Diagnosis not present

## 2018-12-19 DIAGNOSIS — I69351 Hemiplegia and hemiparesis following cerebral infarction affecting right dominant side: Secondary | ICD-10-CM | POA: Diagnosis not present

## 2018-12-19 DIAGNOSIS — I10 Essential (primary) hypertension: Secondary | ICD-10-CM | POA: Diagnosis not present

## 2018-12-19 DIAGNOSIS — I69322 Dysarthria following cerebral infarction: Secondary | ICD-10-CM | POA: Diagnosis not present

## 2019-01-02 ENCOUNTER — Telehealth: Payer: Self-pay | Admitting: Cardiology

## 2019-01-02 NOTE — Telephone Encounter (Signed)
Left detailed message for Hilda Blades.

## 2019-01-02 NOTE — Telephone Encounter (Signed)
Received telephone call from [employer]. They need a diagnosis for recent monitor for patient.  [phone] ext # [phone] ask for Debra .

## 2019-01-23 ENCOUNTER — Other Ambulatory Visit: Payer: Self-pay | Admitting: *Deleted

## 2019-01-23 ENCOUNTER — Other Ambulatory Visit: Payer: Self-pay | Admitting: Cardiology

## 2019-01-23 ENCOUNTER — Other Ambulatory Visit: Payer: Medicare PPO

## 2019-01-23 ENCOUNTER — Other Ambulatory Visit: Payer: Self-pay

## 2019-01-23 DIAGNOSIS — R6889 Other general symptoms and signs: Secondary | ICD-10-CM | POA: Diagnosis not present

## 2019-01-23 DIAGNOSIS — Z20822 Contact with and (suspected) exposure to covid-19: Secondary | ICD-10-CM

## 2019-01-23 MED ORDER — AMLODIPINE BESYLATE 5 MG PO TABS: 5.0000 mg | ORAL_TABLET | Freq: Every day | ORAL | 3 refills | Status: DC

## 2019-01-23 NOTE — Telephone Encounter (Signed)
Needing refill on her amLODipine (NORVASC) 5 MG tablet [[id number]] ENDED

## 2019-01-23 NOTE — Telephone Encounter (Signed)
REFILL SENT IN 

## 2019-01-29 ENCOUNTER — Telehealth: Payer: Self-pay

## 2019-01-29 LAB — NOVEL CORONAVIRUS, NAA: SARS-CoV-2, NAA: NOT DETECTED

## 2019-01-29 NOTE — Telephone Encounter (Signed)
Patient called and says she gave her daughter's number to receive her covid test results and they called her daughter but not her. She gave her number to update in the system. I advised negative covid results, she verbalized understanding.

## 2019-02-14 DIAGNOSIS — I69351 Hemiplegia and hemiparesis following cerebral infarction affecting right dominant side: Secondary | ICD-10-CM | POA: Diagnosis not present

## 2019-02-14 DIAGNOSIS — I69322 Dysarthria following cerebral infarction: Secondary | ICD-10-CM | POA: Diagnosis not present

## 2019-02-14 DIAGNOSIS — E1149 Type 2 diabetes mellitus with other diabetic neurological complication: Secondary | ICD-10-CM | POA: Diagnosis not present

## 2019-02-14 DIAGNOSIS — I10 Essential (primary) hypertension: Secondary | ICD-10-CM | POA: Diagnosis not present

## 2019-03-12 DIAGNOSIS — I10 Essential (primary) hypertension: Secondary | ICD-10-CM | POA: Diagnosis not present

## 2019-03-12 DIAGNOSIS — E782 Mixed hyperlipidemia: Secondary | ICD-10-CM | POA: Diagnosis not present

## 2019-03-12 DIAGNOSIS — R7301 Impaired fasting glucose: Secondary | ICD-10-CM | POA: Diagnosis not present

## 2019-03-19 DIAGNOSIS — R7301 Impaired fasting glucose: Secondary | ICD-10-CM | POA: Diagnosis not present

## 2019-03-19 DIAGNOSIS — D696 Thrombocytopenia, unspecified: Secondary | ICD-10-CM | POA: Diagnosis not present

## 2019-03-19 DIAGNOSIS — I10 Essential (primary) hypertension: Secondary | ICD-10-CM | POA: Diagnosis not present

## 2019-03-19 DIAGNOSIS — R141 Gas pain: Secondary | ICD-10-CM | POA: Diagnosis not present

## 2019-03-19 DIAGNOSIS — R140 Abdominal distension (gaseous): Secondary | ICD-10-CM | POA: Diagnosis not present

## 2019-03-19 DIAGNOSIS — K5900 Constipation, unspecified: Secondary | ICD-10-CM | POA: Diagnosis not present

## 2019-03-19 DIAGNOSIS — E782 Mixed hyperlipidemia: Secondary | ICD-10-CM | POA: Diagnosis not present

## 2019-03-19 DIAGNOSIS — I2510 Atherosclerotic heart disease of native coronary artery without angina pectoris: Secondary | ICD-10-CM | POA: Diagnosis not present

## 2019-03-19 DIAGNOSIS — R001 Bradycardia, unspecified: Secondary | ICD-10-CM | POA: Diagnosis not present

## 2019-03-30 ENCOUNTER — Other Ambulatory Visit: Payer: Self-pay | Admitting: *Deleted

## 2019-03-30 MED ORDER — METOPROLOL TARTRATE 50 MG PO TABS: 75.0000 mg | ORAL_TABLET | Freq: Two times a day (BID) | ORAL | 3 refills | Status: DC

## 2019-04-09 DIAGNOSIS — R197 Diarrhea, unspecified: Secondary | ICD-10-CM | POA: Diagnosis not present

## 2019-04-09 DIAGNOSIS — R1032 Left lower quadrant pain: Secondary | ICD-10-CM | POA: Diagnosis not present

## 2019-04-09 DIAGNOSIS — R1031 Right lower quadrant pain: Secondary | ICD-10-CM | POA: Diagnosis not present

## 2019-04-09 DIAGNOSIS — K5900 Constipation, unspecified: Secondary | ICD-10-CM | POA: Diagnosis not present

## 2019-04-23 DIAGNOSIS — H00015 Hordeolum externum left lower eyelid: Secondary | ICD-10-CM | POA: Diagnosis not present

## 2019-04-23 DIAGNOSIS — H01005 Unspecified blepharitis left lower eyelid: Secondary | ICD-10-CM | POA: Diagnosis not present

## 2019-04-23 DIAGNOSIS — H01002 Unspecified blepharitis right lower eyelid: Secondary | ICD-10-CM | POA: Diagnosis not present

## 2019-04-29 DIAGNOSIS — Z20828 Contact with and (suspected) exposure to other viral communicable diseases: Secondary | ICD-10-CM | POA: Diagnosis not present

## 2019-05-01 DIAGNOSIS — R197 Diarrhea, unspecified: Secondary | ICD-10-CM | POA: Diagnosis not present

## 2019-05-01 DIAGNOSIS — K5730 Diverticulosis of large intestine without perforation or abscess without bleeding: Secondary | ICD-10-CM | POA: Diagnosis not present

## 2019-05-01 DIAGNOSIS — K6389 Other specified diseases of intestine: Secondary | ICD-10-CM | POA: Diagnosis not present

## 2019-05-01 DIAGNOSIS — R194 Change in bowel habit: Secondary | ICD-10-CM | POA: Diagnosis not present

## 2019-05-10 ENCOUNTER — Encounter (INDEPENDENT_AMBULATORY_CARE_PROVIDER_SITE_OTHER): Payer: Self-pay | Admitting: Nurse Practitioner

## 2019-05-14 ENCOUNTER — Ambulatory Visit: Payer: Medicare PPO | Admitting: Cardiology

## 2019-05-15 ENCOUNTER — Other Ambulatory Visit: Payer: Self-pay

## 2019-05-15 MED ORDER — METOPROLOL TARTRATE 50 MG PO TABS: 75.0000 mg | ORAL_TABLET | Freq: Two times a day (BID) | ORAL | 3 refills | Status: AC

## 2019-05-15 MED ORDER — AMLODIPINE BESYLATE 5 MG PO TABS: 5.0000 mg | ORAL_TABLET | Freq: Every day | ORAL | 3 refills | Status: DC

## 2019-05-15 NOTE — Telephone Encounter (Signed)
refilled amlodipine and metoprolol to [facility] mail order

## 2019-05-21 ENCOUNTER — Other Ambulatory Visit: Payer: Self-pay

## 2019-05-21 ENCOUNTER — Telehealth: Payer: Self-pay | Admitting: Student

## 2019-05-21 MED ORDER — AMLODIPINE BESYLATE 5 MG PO TABS: 5.0000 mg | ORAL_TABLET | Freq: Every day | ORAL | 3 refills | Status: DC

## 2019-05-21 NOTE — Telephone Encounter (Signed)
Refilled amlodipine 

## 2019-05-21 NOTE — Telephone Encounter (Signed)
[employer] left a message requesting to send in an Emergency refill on pt's amLODipine (NORVASC) 5 MG tablet [id number]  To [facility] in [place]

## 2019-05-21 NOTE — Telephone Encounter (Signed)
Done

## 2019-06-01 ENCOUNTER — Encounter: Payer: Self-pay | Admitting: *Deleted

## 2019-06-01 ENCOUNTER — Ambulatory Visit: Payer: Medicare PPO | Admitting: Cardiology

## 2019-06-01 ENCOUNTER — Encounter: Payer: Self-pay | Admitting: Cardiology

## 2019-06-01 NOTE — Progress Notes (Deleted)
Cardiology Office Note  Date: 06/01/2019   ID: [name] [name], [name] [name] 06/10/1941, MRN [id number]  PCP:  Celene Squibb, MD  Cardiologist:  Rozann Lesches, MD Electrophysiologist:  None   No chief complaint on file.   History of Present Illness: [name] [name] is a 78 y.o. female last assessed via telehealth encounter in April.  I reviewed the chart, she was hospitalized in April with an acute stroke.  Brain MRI demonstrated small acute left frontoparietal infarcts and chronic small vessel ischemic change as well as evidence of chronic lacunar infarcts.  Echocardiography revealed normal LVEF, no obvious atrial shunt by agitated saline injection.  She did not have any documented cardiac arrhythmias.  Follow-up event recorder for 30 days did not demonstrate any atrial fibrillation.  She has been on aspirin and Plavix.  Past Medical History:  Diagnosis Date  . CAD (coronary artery disease)    Multivessel, PTCA of posterior descending followed by CABG January 2017  . Essential hypertension   . ST elevation myocardial infarction (STEMI) of inferior wall [facility])    January 2017  . Stroke [facility])    April 2020  . Wound infection after surgery    Sternal wound infection requiring debridement, pectoralis muscle flap repair, wound [facility] February 2017    Past Surgical History:  Procedure Laterality Date  . ABDOMINAL HYSTERECTOMY    . APPENDECTOMY    . APPLICATION OF WOUND VAC N/A 09/04/2015   Procedure: WOUND VAC CHANGE;  Surgeon: [name], MD;  Location: [place];  Service: Thoracic;  Laterality: N/A;  . BACK SURGERY    . CARDIAC CATHETERIZATION N/A 08/10/2015   Procedure: Left Heart Cath and Coronary Angiography;  Surgeon: [name], MD;  Location: [place] CV LAB;  Service: Cardiovascular;  Laterality: N/A;  . CARDIAC CATHETERIZATION  08/10/2015   Procedure: Coronary Balloon Angioplasty;  Surgeon: [name], MD;  Location: [place] CV LAB;  Service: Cardiovascular;;   . CHEST TUBE INSERTION Left 09/08/2015   Procedure: CHEST TUBE INSERTION;  Surgeon: [name], MD;  Location: [place];  Service: Thoracic;  Laterality: Left;  . COLONOSCOPY N/A 03/29/2013   Procedure: COLONOSCOPY;  Surgeon: Rogene Houston, MD;  Location: [facility];  Service: Endoscopy;  Laterality: N/A;  930  . CORONARY ARTERY BYPASS GRAFT N/A 08/14/2015   Procedure: CORONARY ARTERY BYPASS GRAFTING (CABG)X3 LIMA-LAD; SVG-RAMUS; SVG-PD TRANSESOPHAGEAL ECHOCARDIOGRAM (TEE);  Surgeon: [name], MD;  Location: [place];  Service: Open Heart Surgery;  Laterality: N/A;  . PECTORALIS FLAP N/A 09/08/2015   Procedure: Muscle FLAP;  Surgeon: [name], DO;  Location: [place];  Service: Plastics;  Laterality: N/A;  . STERNAL WOUND DEBRIDEMENT N/A 09/01/2015   Procedure: STERNAL WOUND DEBRIDEMENT;  Surgeon: [name], MD;  Location: [place];  Service: Thoracic;  Laterality: N/A;  . STERNAL WOUND DEBRIDEMENT N/A 09/04/2015   Procedure: STERNAL WOUND DEBRIDEMENT;  Surgeon: [name], MD;  Location: [place];  Service: Thoracic;  Laterality: N/A;  . STERNAL WOUND DEBRIDEMENT N/A 09/08/2015   Procedure: STERNAL WOUND DEBRIDEMENT;  Surgeon: [name], MD;  Location: [place];  Service: Thoracic;  Laterality: N/A;  . TEE WITHOUT CARDIOVERSION N/A 08/14/2015   Procedure: TRANSESOPHAGEAL ECHOCARDIOGRAM (TEE);  Surgeon: [name], MD;  Location: [place];  Service: Open Heart Surgery;  Laterality: N/A;    Current Outpatient Medications  Medication Sig Dispense Refill  . amLODipine (NORVASC) 5 MG tablet Take 1  tablet (5 mg total) by mouth at bedtime. 90 tablet 3  . aspirin EC 81 MG tablet Take 81 mg by mouth daily.    Marland Kitchen atorvastatin (LIPITOR) 40 MG tablet Take 1 tablet (40 mg total) by mouth every evening for 30 days. 30 tablet 0  . losartan-hydrochlorothiazide (HYZAAR) 100-25 MG tablet Take 1 tablet by mouth daily.     . metoprolol tartrate (LOPRESSOR) 50 MG tablet Take 1.5 tablets (75 mg  total) by mouth 2 (two) times daily. 270 tablet 3   No current facility-administered medications for this visit.    Allergies:  Other and Peanut-containing drug products   Social History: The patient  reports that she has never smoked. She has never used smokeless tobacco. She reports that she does not drink alcohol or use drugs.   Family History: The patient's family history is not on file.   ROS:  Please see the history of present illness. Otherwise, complete review of systems is positive for {NONE DEFAULTED:18576::"none"}.  All other systems are reviewed and negative.   Physical Exam: VS:  There were no vitals taken for this visit., BMI There is no height or weight on file to calculate BMI.  Wt Readings from Last 3 Encounters:  11/14/18 159 lb 2.8 oz (72.2 kg)  10/31/18 168 lb (76.2 kg)  01/14/18 164 lb (74.4 kg)    General: Patient appears comfortable at rest. HEENT: Conjunctiva and lids normal, oropharynx clear with moist mucosa. Neck: Supple, no elevated JVP or carotid bruits, no thyromegaly. Lungs: Clear to auscultation, nonlabored breathing at rest. Cardiac: Regular rate and rhythm, no S3 or significant systolic murmur, no pericardial rub. Abdomen: Soft, nontender, no hepatomegaly, bowel sounds present, no guarding or rebound. Extremities: No pitting edema, distal pulses 2+. Skin: Warm and dry. Musculoskeletal: No kyphosis. Neuropsychiatric: Alert and oriented x3, affect grossly appropriate.  ECG:  An ECG dated 11/14/2018 was personally reviewed today and demonstrated:  Sinus bradycardia with prolonged PR interval, left atrial enlargement, nonspecific ST-T changes.  Recent Labwork: 11/14/2018: ALT 24; AST 22; Hemoglobin 13.5; Magnesium 2.0; Platelets 153 11/15/2018: BUN 15; Creatinine, Ser 0.64; Potassium 3.9; Sodium 140     Component Value Date/Time   CHOL 101 11/15/2018 0514   TRIG 34 11/15/2018 0514   HDL 50 11/15/2018 0514   CHOLHDL 2.0 11/15/2018 0514   VLDL 7  11/15/2018 0514   LDLCALC 44 11/15/2018 0514  February 2020: Hemoglobin 12.6, platelets 143, BUN 14, creatinine 0.92, potassium 3.9, AST 18, ALT 15, cholesterol 110, triglycerides 41, HDL 54, LDL 48, hemoglobin A1c 5.8%  Other Studies Reviewed Today:  Echocardiogram4/22/2020:  1. The left ventricle has normal systolic function with an ejection fraction of 60-65%. The cavity size was normal. Mild basal septal hypertrophy. Left ventricular diastolic parameters were normal. No evidence of left ventricular regional wall motion  abnormalities.  2. The mitral valve is grossly normal. There is mild mitral annular calcification present.  3. The tricuspid valve is grossly normal.  4. The aortic valve is tricuspid. Aortic valve regurgitation is mild by color flow Doppler.  5. The aortic root is normal in size and structure.  6. No atrial level shunt detected by color flow Doppler. Agitated saline contrast was given intravenously to evaluate for intracardiac shunting. Saline contrast bubble study was negative, with no evidence of any interatrial shunt.  Event recorder 11/27/2018: 30-day event recorder reviewed.  Sinus rhythm was present.  Heart rate ranged from 45 bpm up to 122 bpm with average heart rate 63  bpm. Slow heart rate corresponded to sinus bradycardia and fast heart rate corresponded to sinus tachycardia.  There were no pauses or arrhythmias.  Rare PVCs noted.  Assessment and Plan:   Medication Adjustments/Labs and Tests Ordered: Current medicines are reviewed at length with the patient today.  Concerns regarding medicines are outlined above.   Tests Ordered: No orders of the defined types were placed in this encounter.   Medication Changes: No orders of the defined types were placed in this encounter.   Disposition:  Follow up {follow up:15908}  Signed, Satira Sark, MD, [place] 06/01/2019 8:27 AM    [facility] [place]. [address]  [place], [place] [address] Phone: [phone]; Fax: [phone]

## 2019-06-01 NOTE — Progress Notes (Signed)
Patient came to office today for 1:40 pm visit with [name] [name]. Covid questionnaire done and patient says she has been around her daughter who was exposed to covid and tested this morning. Advised that per protocol, he appointment could be switched to virtual or rescheduled. Patient refused virtual and wishes to reschedule visit.

## 2019-06-11 DIAGNOSIS — F458 Other somatoform disorders: Secondary | ICD-10-CM | POA: Diagnosis not present

## 2019-06-11 DIAGNOSIS — R197 Diarrhea, unspecified: Secondary | ICD-10-CM | POA: Diagnosis not present

## 2019-07-04 ENCOUNTER — Other Ambulatory Visit: Payer: Self-pay

## 2019-07-04 DIAGNOSIS — Z20822 Contact with and (suspected) exposure to covid-19: Secondary | ICD-10-CM

## 2019-07-05 LAB — NOVEL CORONAVIRUS, NAA: SARS-CoV-2, NAA: NOT DETECTED

## 2019-07-09 ENCOUNTER — Ambulatory Visit (INDEPENDENT_AMBULATORY_CARE_PROVIDER_SITE_OTHER): Payer: Medicare PPO | Admitting: Cardiology

## 2019-07-09 ENCOUNTER — Encounter: Payer: Self-pay | Admitting: Cardiology

## 2019-07-09 VITALS — BP 135/71 | HR 64 | Temp 97.5°F | Ht 66.0 in | Wt 171.0 lb

## 2019-07-09 DIAGNOSIS — I25119 Atherosclerotic heart disease of native coronary artery with unspecified angina pectoris: Secondary | ICD-10-CM | POA: Diagnosis not present

## 2019-07-09 DIAGNOSIS — Z8673 Personal history of transient ischemic attack (TIA), and cerebral infarction without residual deficits: Secondary | ICD-10-CM | POA: Diagnosis not present

## 2019-07-09 DIAGNOSIS — I10 Essential (primary) hypertension: Secondary | ICD-10-CM | POA: Diagnosis not present

## 2019-07-09 DIAGNOSIS — E782 Mixed hyperlipidemia: Secondary | ICD-10-CM | POA: Diagnosis not present

## 2019-07-09 NOTE — Progress Notes (Signed)
Cardiology Office Note  Date: 07/09/2019   ID: [name], [name] July 10, 1941, MRN [id number]  PCP:  Celene Squibb, MD  Cardiologist:  Rozann Lesches, MD Electrophysiologist:  None   Chief Complaint  Patient presents with  . Cardiac follow-up    History of Present Illness: [name] [name] is a 78 y.o. female last assessed via telehealth encounter in April.  She presents for a follow-up visit.  Overall, doing reasonably well.  She does not report any angina symptoms or worsening shortness of breath.  She did have a stroke back in April, is now on Plavix.  Follow-up 30-day event recorder did not reveal any atrial arrhythmias.  She had an echocardiogram and ECG back in April which are both reviewed below.  I went over her medications which are outlined below but otherwise stable from a cardiac perspective.  She continues to follow with Dr. Nevada Crane, we are requesting his interval lab work.  LDL from April was 44.  She states that she wears a mask when she goes out.  Has not been as active during the pandemic.  Past Medical History:  Diagnosis Date  . CAD (coronary artery disease)    Multivessel, PTCA of posterior descending followed by CABG January 2017  . Essential hypertension   . ST elevation myocardial infarction (STEMI) of inferior wall [facility])    January 2017  . Stroke [facility])    April 2020  . Wound infection after surgery    Sternal wound infection requiring debridement, pectoralis muscle flap repair, wound [facility] February 2017    Past Surgical History:  Procedure Laterality Date  . ABDOMINAL HYSTERECTOMY    . APPENDECTOMY    . APPLICATION OF WOUND VAC N/A 09/04/2015   Procedure: WOUND VAC CHANGE;  Surgeon: [name], MD;  Location: [place];  Service: Thoracic;  Laterality: N/A;  . BACK SURGERY    . CARDIAC CATHETERIZATION N/A 08/10/2015   Procedure: Left Heart Cath and Coronary Angiography;  Surgeon: [name], MD;  Location: [place] CV LAB;  Service:  Cardiovascular;  Laterality: N/A;  . CARDIAC CATHETERIZATION  08/10/2015   Procedure: Coronary Balloon Angioplasty;  Surgeon: [name], MD;  Location: [place] CV LAB;  Service: Cardiovascular;;  . CHEST TUBE INSERTION Left 09/08/2015   Procedure: CHEST TUBE INSERTION;  Surgeon: [name], MD;  Location: [place];  Service: Thoracic;  Laterality: Left;  . COLONOSCOPY N/A 03/29/2013   Procedure: COLONOSCOPY;  Surgeon: Rogene Houston, MD;  [facility];  Service: Endoscopy;  Laterality: N/A;  930  . CORONARY ARTERY BYPASS GRAFT N/A 08/14/2015   Procedure: CORONARY ARTERY BYPASS GRAFTING (CABG)X3 LIMA-LAD; SVG-RAMUS; SVG-PD TRANSESOPHAGEAL ECHOCARDIOGRAM (TEE);  Surgeon: [name], MD;  Location: [place];  Service: Open Heart Surgery;  Laterality: N/A;  . PECTORALIS FLAP N/A 09/08/2015   Procedure: Muscle FLAP;  Surgeon: [name], DO;  Location: [place];  Service: Plastics;  Laterality: N/A;  . STERNAL WOUND DEBRIDEMENT N/A 09/01/2015   Procedure: STERNAL WOUND DEBRIDEMENT;  Surgeon: [name], MD;  Location: [place];  Service: Thoracic;  Laterality: N/A;  . STERNAL WOUND DEBRIDEMENT N/A 09/04/2015   Procedure: STERNAL WOUND DEBRIDEMENT;  Surgeon: [name], MD;  Location: [place];  Service: Thoracic;  Laterality: N/A;  . STERNAL WOUND DEBRIDEMENT N/A 09/08/2015   Procedure: STERNAL WOUND DEBRIDEMENT;  Surgeon: [name], MD;  Location: [place];  Service: Thoracic;  Laterality: N/A;  .  TEE WITHOUT CARDIOVERSION N/A 08/14/2015   Procedure: TRANSESOPHAGEAL ECHOCARDIOGRAM (TEE);  Surgeon: [name], MD;  Location: [place];  Service: Open Heart Surgery;  Laterality: N/A;    Current Outpatient Medications  Medication Sig Dispense Refill  . amLODipine (NORVASC) 5 MG tablet Take 1 tablet (5 mg total) by mouth at bedtime. 90 tablet 3  . atorvastatin (LIPITOR) 40 MG tablet Take 1 tablet (40 mg total) by mouth every evening for 30 days. 30 tablet 0  .  clopidogrel (PLAVIX) 75 MG tablet Take 75 mg by mouth daily.     Marland Kitchen losartan-hydrochlorothiazide (HYZAAR) 100-25 MG tablet Take 1 tablet by mouth daily.     . metoprolol tartrate (LOPRESSOR) 50 MG tablet Take 1.5 tablets (75 mg total) by mouth 2 (two) times daily. 270 tablet 3   No current facility-administered medications for this visit.   Allergies:  Other and Peanut-containing drug products   Social History: The patient  reports that she has never smoked. She has never used smokeless tobacco. She reports that she does not drink alcohol or use drugs.   ROS:  Please see the history of present illness. Otherwise, complete review of systems is positive for none.  All other systems are reviewed and negative.   Physical Exam: VS:  BP 135/71   Pulse 64   Temp (!) 97.5 F (36.4 C)   Ht 5\' 6"  (1.676 m)   Wt 171 lb (77.6 kg)   SpO2 97%   BMI 27.60 kg/m , BMI Body mass index is 27.6 kg/m.  Wt Readings from Last 3 Encounters:  07/09/19 171 lb (77.6 kg)  11/14/18 159 lb 2.8 oz (72.2 kg)  10/31/18 168 lb (76.2 kg)    General: Patient appears comfortable at rest. HEENT: Conjunctiva and lids normal, wearing a mask. Neck: Supple, no elevated JVP or carotid bruits, no thyromegaly. Thorax: Healed sternal incision, no erythema or drainage. Lungs: Clear to auscultation, nonlabored breathing at rest. Cardiac: Regular rate and rhythm, no S3 or significant systolic murmur, no pericardial rub. Abdomen: Soft, nontender, bowel sounds present. Extremities: No pitting edema, distal pulses 2+. Skin: Warm and dry. Musculoskeletal: No kyphosis. Neuropsychiatric: Alert and oriented x3, affect grossly appropriate.  ECG:  An ECG dated 11/14/2018 was personally reviewed today and demonstrated:  Sinus bradycardia with prolonged PR interval, left atrial enlargement.  Nonspecific T wave changes.  Recent Labwork: 11/14/2018: ALT 24; AST 22; Hemoglobin 13.5; Magnesium 2.0; Platelets 153 11/15/2018: BUN 15;  Creatinine, Ser 0.64; Potassium 3.9; Sodium 140     Component Value Date/Time   CHOL 101 11/15/2018 0514   TRIG 34 11/15/2018 0514   HDL 50 11/15/2018 0514   CHOLHDL 2.0 11/15/2018 0514   VLDL 7 11/15/2018 0514   LDLCALC 44 11/15/2018 0514    Other Studies Reviewed Today:  Echocardiogram4/22/2020:  1. The left ventricle has normal systolic function with an ejection fraction of 60-65%. The cavity size was normal. Mild basal septal hypertrophy. Left ventricular diastolic parameters were normal. No evidence of left ventricular regional wall motion  abnormalities.  2. The mitral valve is grossly normal. There is mild mitral annular calcification present.  3. The tricuspid valve is grossly normal.  4. The aortic valve is tricuspid. Aortic valve regurgitation is mild by color flow Doppler.  5. The aortic root is normal in size and structure.  6. No atrial level shunt detected by color flow Doppler. Agitated saline contrast was given intravenously to evaluate for intracardiac shunting. Saline contrast bubble study was  negative, with no evidence of any interatrial shunt.  Assessment and Plan:  1.  Multivessel CAD status post CABG in January 2017.  She is currently on Plavix, Norvasc, Lipitor, Hyzaar, and Lopressor.  No progressive angina symptoms.  Echocardiogram in April revealed LVEF 60 to 65%.  2.  History of stroke in April.  No atrial arrhythmias documented by subsequent event monitor.  Continue Plavix.  3.  Hyperlipidemia, continues on Lipitor with excellent LDL control, 44 back in April.  4.  Essential hypertension, systolic in the [id number] today.  No changes made.  Keep follow-up with Dr. Nevada Crane.  Medication Adjustments/Labs and Tests Ordered: Current medicines are reviewed at length with the patient today.  Concerns regarding medicines are outlined above.   Tests Ordered: No orders of the defined types were placed in this encounter.   Medication Changes: No orders of the defined  types were placed in this encounter.   Disposition:  Follow up 6 months in the [place] office.  Signed, Satira Sark, MD, [facility] 07/09/2019 3:16 PM    [place] [facility] [address] [place], [name] [address] Phone: [phone]; Fax: [phone]

## 2019-07-09 NOTE — Patient Instructions (Signed)
Medication Instructions:  Your physician recommends that you continue on your current medications as directed. Please refer to the Current Medication list given to you today.  *If you need a refill on your cardiac medications before your next appointment, please call your pharmacy*  Lab Work: None today If you have labs (blood work) drawn today and your tests are completely normal, you will receive your results only by: . MyChart Message (if you have MyChart) OR . A paper copy in the mail If you have any lab test that is abnormal or we need to change your treatment, we will call you to review the results.  Testing/Procedures: None today  Follow-Up: At [facility], you and your health needs are our priority.  As part of our continuing mission to provide you with exceptional heart care, we have created designated Provider Care Teams.  These Care Teams include your primary Cardiologist (physician) and Advanced Practice Providers (APPs -  Physician Assistants and Nurse Practitioners) who all work together to provide you with the care you need, when you need it.  Your next appointment:   6 months  The format for your next appointment:   In Person  Provider:   Samuel McDowell, MD  Other Instructions None     Thank you for choosing [facility] !         

## 2019-07-13 DIAGNOSIS — B029 Zoster without complications: Secondary | ICD-10-CM | POA: Diagnosis not present

## 2019-07-23 DIAGNOSIS — B029 Zoster without complications: Secondary | ICD-10-CM | POA: Diagnosis not present

## 2019-07-26 ENCOUNTER — Ambulatory Visit (INDEPENDENT_AMBULATORY_CARE_PROVIDER_SITE_OTHER): Payer: Medicare PPO | Admitting: Nurse Practitioner

## 2019-08-15 DIAGNOSIS — I69322 Dysarthria following cerebral infarction: Secondary | ICD-10-CM | POA: Diagnosis not present

## 2019-08-15 DIAGNOSIS — I69351 Hemiplegia and hemiparesis following cerebral infarction affecting right dominant side: Secondary | ICD-10-CM | POA: Diagnosis not present

## 2019-08-15 DIAGNOSIS — E1149 Type 2 diabetes mellitus with other diabetic neurological complication: Secondary | ICD-10-CM | POA: Diagnosis not present

## 2019-08-15 DIAGNOSIS — I10 Essential (primary) hypertension: Secondary | ICD-10-CM | POA: Diagnosis not present

## 2019-09-26 DIAGNOSIS — I10 Essential (primary) hypertension: Secondary | ICD-10-CM | POA: Diagnosis not present

## 2019-09-26 DIAGNOSIS — I872 Venous insufficiency (chronic) (peripheral): Secondary | ICD-10-CM | POA: Diagnosis not present

## 2019-09-26 DIAGNOSIS — D696 Thrombocytopenia, unspecified: Secondary | ICD-10-CM | POA: Diagnosis not present

## 2019-09-26 DIAGNOSIS — Z712 Person consulting for explanation of examination or test findings: Secondary | ICD-10-CM | POA: Diagnosis not present

## 2019-09-26 DIAGNOSIS — R141 Gas pain: Secondary | ICD-10-CM | POA: Diagnosis not present

## 2019-09-26 DIAGNOSIS — R7301 Impaired fasting glucose: Secondary | ICD-10-CM | POA: Diagnosis not present

## 2019-10-03 DIAGNOSIS — I10 Essential (primary) hypertension: Secondary | ICD-10-CM | POA: Diagnosis not present

## 2019-10-03 DIAGNOSIS — R001 Bradycardia, unspecified: Secondary | ICD-10-CM | POA: Diagnosis not present

## 2019-10-03 DIAGNOSIS — E782 Mixed hyperlipidemia: Secondary | ICD-10-CM | POA: Diagnosis not present

## 2019-10-03 DIAGNOSIS — R140 Abdominal distension (gaseous): Secondary | ICD-10-CM | POA: Diagnosis not present

## 2019-10-03 DIAGNOSIS — I2510 Atherosclerotic heart disease of native coronary artery without angina pectoris: Secondary | ICD-10-CM | POA: Diagnosis not present

## 2019-11-27 ENCOUNTER — Other Ambulatory Visit (HOSPITAL_COMMUNITY): Payer: Self-pay | Admitting: Adult Health Nurse Practitioner

## 2019-11-27 ENCOUNTER — Ambulatory Visit (HOSPITAL_COMMUNITY)
Admission: RE | Admit: 2019-11-27 | Discharge: 2019-11-27 | Disposition: A | Discharge status: Home / Self Care | Payer: Medicare Other | Source: Ambulatory Visit | Attending: Adult Health Nurse Practitioner | Admitting: Adult Health Nurse Practitioner

## 2019-11-27 ENCOUNTER — Other Ambulatory Visit: Payer: Self-pay

## 2019-11-27 DIAGNOSIS — M25561 Pain in right knee: Secondary | ICD-10-CM | POA: Diagnosis not present

## 2019-11-27 DIAGNOSIS — S83011A Lateral subluxation of right patella, initial encounter: Secondary | ICD-10-CM | POA: Diagnosis not present

## 2019-12-10 ENCOUNTER — Ambulatory Visit (HOSPITAL_COMMUNITY)
Admission: RE | Admit: 2019-12-10 | Discharge: 2019-12-10 | Disposition: A | Discharge status: Home / Self Care | Payer: Medicare Other | Source: Ambulatory Visit | Attending: Surgical | Admitting: Surgical

## 2019-12-10 ENCOUNTER — Ambulatory Visit (INDEPENDENT_AMBULATORY_CARE_PROVIDER_SITE_OTHER): Payer: Medicare Other | Admitting: Plastic Surgery

## 2019-12-10 ENCOUNTER — Other Ambulatory Visit: Payer: Self-pay

## 2019-12-10 ENCOUNTER — Encounter: Payer: Self-pay | Admitting: Plastic Surgery

## 2019-12-10 ENCOUNTER — Other Ambulatory Visit (HOSPITAL_COMMUNITY): Payer: Self-pay | Admitting: Surgical

## 2019-12-10 VITALS — BP 145/67 | HR 66 | Temp 97.8°F | Ht 70.0 in | Wt 166.0 lb

## 2019-12-10 DIAGNOSIS — R0602 Shortness of breath: Secondary | ICD-10-CM | POA: Diagnosis not present

## 2019-12-10 DIAGNOSIS — T8132XA Disruption of internal operation (surgical) wound, not elsewhere classified, initial encounter: Secondary | ICD-10-CM

## 2019-12-10 DIAGNOSIS — M25561 Pain in right knee: Secondary | ICD-10-CM | POA: Diagnosis not present

## 2019-12-10 DIAGNOSIS — M25562 Pain in left knee: Secondary | ICD-10-CM | POA: Diagnosis not present

## 2019-12-10 NOTE — Progress Notes (Signed)
Patient ID: [name] [name], female    DOB: 1940-09-09, 79 y.o.   MRN: [id number]   Chief Complaint  Patient presents with  . Advice Only    for skin swelling/soreness after sx in 2017 (by D)    The patient is a 79 year old patient for chest.  She underwent cardiac surgery 4 years ago.  She had wound dehiscence with breakdown.  She underwent wound repair with myself and Dr. Darcey Nora.  She underwent a pectoralis turnover flap.  Her cardiac bypass was January 2017 the flap was done in February 2017.  The left internal mammary artery had been used for the LAD.  The saphenous vein graft was also used she had a harvest of the right leg greater saphenous vein.  She had developed a retrosternal abscess with dehiscence.  After the muscle flap she did extremely well.  She is here today because she noticed some swelling in the superior right portion of her sternum.  It is a little bit tender to touch.  I do not see any seroma or hematoma.  There is no sign of infection. The incision is intact and very nicely healed.  She does have a keloid which is tender.   Review of Systems  Constitutional: Negative for activity change and appetite change.  Eyes: Negative.   Respiratory: Negative.  Negative for choking.   Cardiovascular: Negative.   Gastrointestinal: Negative.   Endocrine: Negative.   Genitourinary: Negative.   Musculoskeletal: Negative.   Hematological: Negative.   Psychiatric/Behavioral: Negative.     Past Medical History:  Diagnosis Date  . CAD (coronary artery disease)    Multivessel, PTCA of posterior descending followed by CABG January 2017  . Essential hypertension   . ST elevation myocardial infarction (STEMI) of inferior wall [facility])    January 2017  . Stroke [facility])    April 2020  . Wound infection after surgery    Sternal wound infection requiring debridement, pectoralis muscle flap repair, wound [facility] February 2017    Past Surgical History:  Procedure Laterality Date  .  ABDOMINAL HYSTERECTOMY    . APPENDECTOMY    . APPLICATION OF WOUND VAC N/A 09/04/2015   Procedure: WOUND VAC CHANGE;  Surgeon: [name], MD;  Location: [place];  Service: Thoracic;  Laterality: N/A;  . BACK SURGERY    . CARDIAC CATHETERIZATION N/A 08/10/2015   Procedure: Left Heart Cath and Coronary Angiography;  Surgeon: [name], MD;  Location: [place] CV LAB;  Service: Cardiovascular;  Laterality: N/A;  . CARDIAC CATHETERIZATION  08/10/2015   Procedure: Coronary Balloon Angioplasty;  Surgeon: [name], MD;  Location: [place] CV LAB;  Service: Cardiovascular;;  . CHEST TUBE INSERTION Left 09/08/2015   Procedure: CHEST TUBE INSERTION;  Surgeon: [name], MD;  Location: [place];  Service: Thoracic;  Laterality: Left;  . COLONOSCOPY N/A 03/29/2013   Procedure: COLONOSCOPY;  Surgeon: Rogene Houston, MD;  [facility];  Service: Endoscopy;  Laterality: N/A;  930  . CORONARY ARTERY BYPASS GRAFT N/A 08/14/2015   Procedure: CORONARY ARTERY BYPASS GRAFTING (CABG)X3 LIMA-LAD; SVG-RAMUS; SVG-PD TRANSESOPHAGEAL ECHOCARDIOGRAM (TEE);  Surgeon: [name], MD;  Location: [place];  Service: Open Heart Surgery;  Laterality: N/A;  . PECTORALIS FLAP N/A 09/08/2015   Procedure: Muscle FLAP;  Surgeon: [name], DO;  Location: [place];  Service: Plastics;  Laterality: N/A;  . STERNAL WOUND DEBRIDEMENT N/A 09/01/2015   Procedure: STERNAL WOUND DEBRIDEMENT;  Surgeon: [name], MD;  Location: [place];  Service: Thoracic;  Laterality: N/A;  . STERNAL WOUND DEBRIDEMENT N/A 09/04/2015   Procedure: STERNAL WOUND DEBRIDEMENT;  Surgeon: [name], MD;  Location: [place];  Service: Thoracic;  Laterality: N/A;  . STERNAL WOUND DEBRIDEMENT N/A 09/08/2015   Procedure: STERNAL WOUND DEBRIDEMENT;  Surgeon: [name], MD;  Location: [place];  Service: Thoracic;  Laterality: N/A;  . TEE WITHOUT CARDIOVERSION N/A 08/14/2015   Procedure: TRANSESOPHAGEAL ECHOCARDIOGRAM  (TEE);  Surgeon: [name], MD;  Location: [place];  Service: Open Heart Surgery;  Laterality: N/A;      Current Outpatient Medications:  .  amLODipine (NORVASC) 5 MG tablet, Take 1 tablet (5 mg total) by mouth at bedtime., Disp: 90 tablet, Rfl: 3 .  atorvastatin (LIPITOR) 40 MG tablet, Take 1 tablet (40 mg total) by mouth every evening for 30 days., Disp: 30 tablet, Rfl: 0 .  clopidogrel (PLAVIX) 75 MG tablet, Take 75 mg by mouth daily. , Disp: , Rfl:  .  losartan-hydrochlorothiazide (HYZAAR) 100-25 MG tablet, Take 1 tablet by mouth daily. , Disp: , Rfl:  .  metoprolol tartrate (LOPRESSOR) 50 MG tablet, Take 1.5 tablets (75 mg total) by mouth 2 (two) times daily., Disp: 270 tablet, Rfl: 3   Objective:   Vitals:   12/10/19 1150  BP: (!) 145/67  Pulse: 66  Temp: 97.8 F (36.6 C)  SpO2: 99%    Physical Exam Vitals and nursing note reviewed.  Constitutional:      Appearance: Normal appearance.  HENT:     Head: Normocephalic and atraumatic.  Cardiovascular:     Rate and Rhythm: Normal rate.     Pulses: Normal pulses.  Pulmonary:     Effort: Pulmonary effort is normal.  Chest:    Abdominal:     General: Abdomen is flat.  Neurological:     General: No focal deficit present.     Mental Status: She is alert and oriented to person, place, and time.  Psychiatric:        Mood and Affect: Mood normal.        Behavior: Behavior normal.        Thought Content: Thought content normal.     Assessment & Plan:  Sternal wound dehiscence, initial encounter  Go ahead and get a chest x-ray for further evaluation.  We will evaluate from there.  We might need a CT scan.  I do not see anything concerning for infection but I am concerned about the tenderness.  We can certainly treat the keloid with a steroid injection intralesionally.  This may help decrease the sensitivity. I would like to see her back in 1 to 2 weeks.  Pictures were obtained of the patient and placed in the chart  with the patient's or guardian's permission.  Caroline, DO

## 2019-12-17 DIAGNOSIS — H9201 Otalgia, right ear: Secondary | ICD-10-CM | POA: Diagnosis not present

## 2019-12-17 DIAGNOSIS — H6121 Impacted cerumen, right ear: Secondary | ICD-10-CM | POA: Diagnosis not present

## 2019-12-19 NOTE — Progress Notes (Signed)
Cardiology Office Note  Date: 12/20/2019   ID: [name] [name], [name] [name] 1941-04-06, MRN [id number]  PCP:  Celene Squibb, MD  Cardiologist:  Rozann Lesches, MD Electrophysiologist:  None   Chief Complaint  Patient presents with  . Cardiac follow-up    History of Present Illness: [name] [name] is a 79 y.o. female last seen in December 2020.  She presents for a routine visit.  She does not report any active angina at this time.  Recently started back with [facility] at the local senior center.  She was recently seen by Dr. Elisabeth Cara with Plastics for follow-up of sternal skin tightness and soreness with previous sternotomy in 2017.  Chest x-ray showed mild interstitial pulmonary edema, no other acute changes.  She does not report any progressive shortness of breath, orthopnea or PND at this time.  I reviewed her cardiac medications which are stable and outlined below.  She has had some leg weakness with activity, questioned her Lipitor.  Generally her LDL has been very well controlled, most recently 54.  We discussed reducing Lipitor to 20 mg daily.  Also plan to obtain lower extremity ABIs.  I reviewed her lab work obtained by Dr. Nevada Crane back in March as outlined below.  Past Medical History:  Diagnosis Date  . CAD (coronary artery disease)    Multivessel, PTCA of posterior descending followed by CABG January 2017  . Essential hypertension   . ST elevation myocardial infarction (STEMI) of inferior wall [facility])    January 2017  . Stroke [facility])    April 2020  . Wound infection after surgery    Sternal wound infection requiring debridement, pectoralis muscle flap repair, wound [facility] February 2017    Past Surgical History:  Procedure Laterality Date  . ABDOMINAL HYSTERECTOMY    . APPENDECTOMY    . APPLICATION OF WOUND VAC N/A 09/04/2015   Procedure: WOUND VAC CHANGE;  Surgeon: [name], MD;  Location: [place];  Service: Thoracic;  Laterality: N/A;  . BACK SURGERY    . CARDIAC CATHETERIZATION  N/A 08/10/2015   Procedure: Left Heart Cath and Coronary Angiography;  Surgeon: [name], MD;  Location: [place] CV LAB;  Service: Cardiovascular;  Laterality: N/A;  . CARDIAC CATHETERIZATION  08/10/2015   Procedure: Coronary Balloon Angioplasty;  Surgeon: [name], MD;  Location: [place] CV LAB;  Service: Cardiovascular;;  . CHEST TUBE INSERTION Left 09/08/2015   Procedure: CHEST TUBE INSERTION;  Surgeon: [name], MD;  Location: [place];  Service: Thoracic;  Laterality: Left;  . COLONOSCOPY N/A 03/29/2013   Procedure: COLONOSCOPY;  Surgeon: Rogene Houston, MD;  [facility];  Service: Endoscopy;  Laterality: N/A;  930  . CORONARY ARTERY BYPASS GRAFT N/A 08/14/2015   Procedure: CORONARY ARTERY BYPASS GRAFTING (CABG)X3 LIMA-LAD; SVG-RAMUS; SVG-PD TRANSESOPHAGEAL ECHOCARDIOGRAM (TEE);  Surgeon: [name], MD;  Location: [place];  Service: Open Heart Surgery;  Laterality: N/A;  . PECTORALIS FLAP N/A 09/08/2015   Procedure: Muscle FLAP;  Surgeon: [name], DO;  Location: [place];  Service: Plastics;  Laterality: N/A;  . STERNAL WOUND DEBRIDEMENT N/A 09/01/2015   Procedure: STERNAL WOUND DEBRIDEMENT;  Surgeon: [name], MD;  Location: [place];  Service: Thoracic;  Laterality: N/A;  . STERNAL WOUND DEBRIDEMENT N/A 09/04/2015   Procedure: STERNAL WOUND DEBRIDEMENT;  Surgeon: [name], MD;  Location: [place];  Service: Thoracic;  Laterality: N/A;  . STERNAL WOUND DEBRIDEMENT N/A 09/08/2015  Procedure: STERNAL WOUND DEBRIDEMENT;  Surgeon: [name], MD;  Location: [place];  Service: Thoracic;  Laterality: N/A;  . TEE WITHOUT CARDIOVERSION N/A 08/14/2015   Procedure: TRANSESOPHAGEAL ECHOCARDIOGRAM (TEE);  Surgeon: [name], MD;  Location: [place];  Service: Open Heart Surgery;  Laterality: N/A;    Current Outpatient Medications  Medication Sig Dispense Refill  . amLODipine (NORVASC) 5 MG tablet Take 1 tablet (5 mg total) by mouth at  bedtime. 90 tablet 3  . clopidogrel (PLAVIX) 75 MG tablet Take 75 mg by mouth daily.     Marland Kitchen losartan-hydrochlorothiazide (HYZAAR) 100-25 MG tablet Take 1 tablet by mouth daily.     . metoprolol tartrate (LOPRESSOR) 50 MG tablet Take 1.5 tablets (75 mg total) by mouth 2 (two) times daily. 270 tablet 3  . atorvastatin (LIPITOR) 20 MG tablet Take 1 tablet (20 mg total) by mouth daily. 90 tablet 3   No current facility-administered medications for this visit.   Allergies:  Other and Peanut-containing drug products   ROS:   Leg fatigue.  No palpitations or syncope.  Physical Exam: VS:  BP 130/66   Pulse 84   Ht 5\' 9"  (1.753 m)   Wt 168 lb (76.2 kg)   SpO2 95%   BMI 24.81 kg/m , BMI Body mass index is 24.81 kg/m.  Wt Readings from Last 3 Encounters:  12/20/19 168 lb (76.2 kg)  12/10/19 166 lb (75.3 kg)  07/09/19 171 lb (77.6 kg)    General: Elderly woman, appears comfortable at rest. HEENT: Conjunctiva and lids normal, wearing a mask. Neck: Supple, no elevated JVP or carotid bruits, no thyromegaly. Lungs: Clear to auscultation, nonlabored breathing at rest. Cardiac: Regular rate and rhythm, no S3, soft systolic murmur, no pericardial rub. Extremities: Mild ankle edema, distal pulses 2+.  ECG:  An ECG dated 11/14/2018 was personally reviewed today and demonstrated:  Sinus bradycardia with prolonged PR interval, left atrial enlargement, nonspecific T wave changes.  Recent Labwork:  March 2021: Hemoglobin 13.0, platelets 156, BUN 11, creatinine 0.87, potassium 4.0, AST 16, ALT 14, cholesterol 109, triglycerides 44, HDL 55, LDL 43, hemoglobin A1c 5.7%  Other Studies Reviewed Today:  Echocardiogram4/22/2020: 1. The left ventricle has normal systolic function with an ejection fraction of 60-65%. The cavity size was normal. Mild basal septal hypertrophy. Left ventricular diastolic parameters were normal. No evidence of left ventricular regional wall motion  abnormalities. 2. The  mitral valve is grossly normal. There is mild mitral annular calcification present. 3. The tricuspid valve is grossly normal. 4. The aortic valve is tricuspid. Aortic valve regurgitation is mild by color flow Doppler. 5. The aortic root is normal in size and structure. 6. No atrial level shunt detected by color flow Doppler. Agitated saline contrast was given intravenously to evaluate for intracardiac shunting. Saline contrast bubble study was negative, with no evidence of any interatrial shunt.  Assessment and Plan:  1.  Multivessel CAD status post CABG in 2017.  She reports no active angina at this time and has started back to a regular exercise plan.  Continue Plavix, Norvasc, Hyzaar, Lipitor and Lopressor.  2.  Mixed hyperlipidemia, recent LDL 43.  Reduce Lipitor to 20 mg daily in light of reported leg fatigue and discomfort.  3.  Leg fatigue and discomfort with activity.  Check lower extremity arterial Dopplers and ABIs.   Medication Adjustments/Labs and Tests Ordered: Current medicines are reviewed at length with the patient today.  Concerns regarding medicines are outlined above.  Tests Ordered: Orders Placed This Encounter  Procedures  . VAS [place] ABI WITH/WO TBI    Medication Changes: Meds ordered this encounter  Medications  . atorvastatin (LIPITOR) 20 MG tablet    Sig: Take 1 tablet (20 mg total) by mouth daily.    Dispense:  90 tablet    Refill:  3    Disposition:  Follow up 6 months in the [place] office.  Signed, Satira Sark, MD, [facility] 12/20/2019 2:24 PM     [facility] [address] [place], [place] [address] Phone: [phone]; Fax: [phone]

## 2019-12-20 ENCOUNTER — Ambulatory Visit (INDEPENDENT_AMBULATORY_CARE_PROVIDER_SITE_OTHER): Payer: Medicare Other | Admitting: Cardiology

## 2019-12-20 ENCOUNTER — Other Ambulatory Visit: Payer: Self-pay

## 2019-12-20 ENCOUNTER — Encounter: Payer: Self-pay | Admitting: Cardiology

## 2019-12-20 VITALS — BP 130/66 | HR 84 | Ht 69.0 in | Wt 168.0 lb

## 2019-12-20 DIAGNOSIS — Z8673 Personal history of transient ischemic attack (TIA), and cerebral infarction without residual deficits: Secondary | ICD-10-CM

## 2019-12-20 DIAGNOSIS — I25119 Atherosclerotic heart disease of native coronary artery with unspecified angina pectoris: Secondary | ICD-10-CM | POA: Diagnosis not present

## 2019-12-20 DIAGNOSIS — E782 Mixed hyperlipidemia: Secondary | ICD-10-CM

## 2019-12-20 DIAGNOSIS — M79604 Pain in right leg: Secondary | ICD-10-CM | POA: Diagnosis not present

## 2019-12-20 DIAGNOSIS — M79605 Pain in left leg: Secondary | ICD-10-CM

## 2019-12-20 MED ORDER — ATORVASTATIN CALCIUM 20 MG PO TABS
20.0000 mg | ORAL_TABLET | Freq: Every day | ORAL | 3 refills | Status: DC
Start: 2019-12-20 — End: 2020-04-15

## 2019-12-20 NOTE — Patient Instructions (Signed)
Medication Instructions:  Your physician recommends that you continue on your current medications as directed. Please refer to the Current Medication list given to you today.  *If you need a refill on your cardiac medications before your next appointment, please call your pharmacy*   Lab Work: None today If you have labs (blood work) drawn today and your tests are completely normal, you will receive your results only by: Marland Kitchen MyChart Message (if you have MyChart) OR . A paper copy in the mail If you have any lab test that is abnormal or we need to change your treatment, we will call you to review the results.   Testing/Procedures:Your physician has requested that you have an ankle brachial index (ABI). During this test an ultrasound and blood pressure cuff are used to evaluate the arteries that supply the arms and legs with blood. Allow thirty minutes for this exam. There are no restrictions or special instructions.  Follow-Up: At [facility], you and your health needs are our priority.  As part of our continuing mission to provide you with exceptional heart care, we have created designated Provider Care Teams.  These Care Teams include your primary Cardiologist (physician) and Advanced Practice Providers (APPs -  Physician Assistants and Nurse Practitioners) who all work together to provide you with the care you need, when you need it.  We recommend signing up for the patient portal called "MyChart".  Sign up information is provided on this After Visit Summary.  MyChart is used to connect with patients for Virtual Visits (Telemedicine).  Patients are able to view lab/test results, encounter notes, upcoming appointments, etc.  Non-urgent messages can be sent to your provider as well.   To learn more about what you can do with MyChart, go to [email or web address].    Your next appointment:   6 month(s)  The format for your next appointment:   In Person  Provider:   Rozann Lesches, MD  or Bernerd Pho, PA-C   Other Instructions None      Thank you for choosing [facility] !

## 2019-12-25 ENCOUNTER — Encounter: Payer: Self-pay | Admitting: Plastic Surgery

## 2019-12-25 ENCOUNTER — Other Ambulatory Visit: Payer: Self-pay

## 2019-12-25 ENCOUNTER — Ambulatory Visit (INDEPENDENT_AMBULATORY_CARE_PROVIDER_SITE_OTHER): Payer: Medicare Other | Admitting: Plastic Surgery

## 2019-12-25 DIAGNOSIS — L910 Hypertrophic scar: Secondary | ICD-10-CM | POA: Insufficient documentation

## 2019-12-25 NOTE — Progress Notes (Signed)
Procedure Note  Preoperative Dx: Hypertrophic scar of sternal incision  Postoperative Dx: Same  Procedure: Kenalog injection to hypertrophic scar of sternal incision 2 cm  Description of Procedure: Risks and complications were explained to the patient.  Consent was confirmed and the patient understands the risks and benefits.  The potential complications and alternatives were explained and the patient consents.  The patient expressed understanding the option of not having the procedure and the risks of a scar.  Time out was called and all information was confirmed to be correct.    The area was prepped with chlorhexidine.  Lidocaine 1% with epinepherine 0.1 cc was mixed with Kenalog 0.1 cc.  The 1 cm hypertrophic scar at the superior aspect of the incision was injected.  The inferior hypertrophic scar of the sternal incision was injected as well.  A dressing was applied.  The patient was given instructions on how to care for the area and a follow up appointment.  [name] [name] tolerated the procedure well and there were no complications.  I would like to see her back in 4 to 6 weeks.

## 2020-02-01 ENCOUNTER — Encounter: Payer: Self-pay | Admitting: Plastic Surgery

## 2020-02-01 ENCOUNTER — Other Ambulatory Visit: Payer: Self-pay

## 2020-02-01 ENCOUNTER — Ambulatory Visit: Payer: Medicare Other | Admitting: Plastic Surgery

## 2020-02-01 VITALS — BP 155/84 | HR 60 | Temp 96.9°F

## 2020-02-01 DIAGNOSIS — T8149XA Infection following a procedure, other surgical site, initial encounter: Secondary | ICD-10-CM

## 2020-02-01 DIAGNOSIS — T8132XA Disruption of internal operation (surgical) wound, not elsewhere classified, initial encounter: Secondary | ICD-10-CM

## 2020-02-01 DIAGNOSIS — Z719 Counseling, unspecified: Secondary | ICD-10-CM

## 2020-02-01 NOTE — Progress Notes (Signed)
   Subjective:    Patient ID: [name] [name], female    DOB: 07/16/1941, 79 y.o.   MRN: [id number]  The patient is a 79 year old black female here with her daughter for follow-up on her external keloid.  She got a Kenalog injection 1 month ago.  She is extremely happy with the results.  She is continue to massage it.  Patient states it has gotten softer and is no longer painful.  Before she could not even wear a bra because of the discomfort.  Now she is in a bra and it does not bother her at all.     Review of Systems  Constitutional: Negative.   Eyes: Negative.   Respiratory: Negative.   Cardiovascular: Negative.   Gastrointestinal: Negative.   Genitourinary: Negative.   Skin: Positive for color change.  Hematological: Negative.   Psychiatric/Behavioral: Negative.        Objective:   Physical Exam Vitals and nursing note reviewed.  Constitutional:      Appearance: Normal appearance.  HENT:     Head: Normocephalic and atraumatic.  Cardiovascular:     Rate and Rhythm: Normal rate.     Pulses: Normal pulses.  Pulmonary:     Effort: Pulmonary effort is normal.  Neurological:     General: No focal deficit present.     Mental Status: She is alert. Mental status is at baseline.  Psychiatric:        Mood and Affect: Mood normal.        Behavior: Behavior normal.         Assessment & Plan:     ICD-10-CM   1. Wound infection after surgery  T81.49XA   2. Sternal wound dehiscence, initial encounter  T81.32XA     We talked about the options for injecting again.  The patient is so pleased with the results that she would like to wait another month and see if it will continue to resolve.  I think this is very reasonable.  We will see her back in 1 month.  I did also tell her about skinuva. Pictures were obtained of the patient and placed in the chart with the patient's or guardian's permission.

## 2020-02-22 DIAGNOSIS — W57XXXA Bitten or stung by nonvenomous insect and other nonvenomous arthropods, initial encounter: Secondary | ICD-10-CM | POA: Diagnosis not present

## 2020-02-22 DIAGNOSIS — R229 Localized swelling, mass and lump, unspecified: Secondary | ICD-10-CM | POA: Diagnosis not present

## 2020-03-07 ENCOUNTER — Ambulatory Visit: Payer: Medicare Other | Admitting: Plastic Surgery

## 2020-03-15 ENCOUNTER — Other Ambulatory Visit: Payer: Self-pay

## 2020-03-15 ENCOUNTER — Emergency Department (HOSPITAL_COMMUNITY)
Admission: EM | Admit: 2020-03-15 | Discharge: 2020-03-15 | Disposition: A | Discharge status: Home / Self Care | Payer: Medicare Other | Attending: Emergency Medicine | Admitting: Emergency Medicine

## 2020-03-15 ENCOUNTER — Encounter (HOSPITAL_COMMUNITY): Payer: Self-pay | Admitting: Emergency Medicine

## 2020-03-15 DIAGNOSIS — Z91010 Allergy to peanuts: Secondary | ICD-10-CM | POA: Insufficient documentation

## 2020-03-15 DIAGNOSIS — I10 Essential (primary) hypertension: Secondary | ICD-10-CM | POA: Insufficient documentation

## 2020-03-15 DIAGNOSIS — I2510 Atherosclerotic heart disease of native coronary artery without angina pectoris: Secondary | ICD-10-CM | POA: Insufficient documentation

## 2020-03-15 DIAGNOSIS — Z79899 Other long term (current) drug therapy: Secondary | ICD-10-CM | POA: Insufficient documentation

## 2020-03-15 DIAGNOSIS — L299 Pruritus, unspecified: Secondary | ICD-10-CM | POA: Insufficient documentation

## 2020-03-15 DIAGNOSIS — Z951 Presence of aortocoronary bypass graft: Secondary | ICD-10-CM | POA: Diagnosis not present

## 2020-03-15 DIAGNOSIS — R001 Bradycardia, unspecified: Secondary | ICD-10-CM | POA: Diagnosis not present

## 2020-03-15 MED ORDER — PREDNISONE 20 MG PO TABS
40.0000 mg | ORAL_TABLET | Freq: Every day | ORAL | 0 refills | Status: DC
Start: 2020-03-15 — End: 2020-04-15

## 2020-03-15 NOTE — Discharge Instructions (Addendum)
Continue to take benadryl 25 mg every 6 hours as needed for itching. The steroids should help too. Return for evaluation if you develop difficulty with your breathing, severe nausea/vomiting/abdominal cramping or you feel like you may pass out.

## 2020-03-15 NOTE — ED Triage Notes (Signed)
Pt c/o itching that started yesterday. Pt has some hives and redness to thighs. Pt last took benadryl x 2 hours ago.

## 2020-03-19 NOTE — ED Provider Notes (Signed)
[facility] Provider Note   CSN: [id number] Arrival date & time: 03/15/20  0100     History Chief Complaint  Patient presents with  . Allergic Reaction    [name] [name] is a 79 y.o. female.  HPI   79 year old female with pruritus.  Began itching yesterday.  Diffuse.  Some intermittent hives.  Improvement with Benadryl.  No acute respiratory complaints.  No GI symptoms.  No new exposures that she is aware of.  Past Medical History:  Diagnosis Date  . CAD (coronary artery disease)    Multivessel, PTCA of posterior descending followed by CABG January 2017  . Essential hypertension   . ST elevation myocardial infarction (STEMI) of inferior wall [facility])    January 2017  . Stroke [facility]    April 2020  . Wound infection after surgery    Sternal wound infection requiring debridement, pectoralis muscle flap repair, wound [facility] February 2017    Patient Active Problem List   Diagnosis Date Noted  . Hypertrophic scar 12/25/2019  . Acute CVA (cerebrovascular accident) ([place]) 11/15/2018  . TIA (transient ischemic attack) 11/15/2018  . Wound infection after surgery 09/01/2015  . Retrosternal abscess ([place]) 08/31/2015  . Sternal wound dehiscence 08/31/2015  . S/P CABG x 3 08/14/2015  . CAD (coronary artery disease), native coronary artery 08/11/2015  . CAD (coronary artery disease)   . Acute inferior myocardial infarction ([place]) 08/10/2015  . Acute MI, inferior wall, initial episode of care ([place])   . Essential hypertension 02/19/2015    Past Surgical History:  Procedure Laterality Date  . ABDOMINAL HYSTERECTOMY    . APPENDECTOMY    . APPLICATION OF WOUND VAC N/A 09/04/2015   Procedure: WOUND VAC CHANGE;  Surgeon: [name], MD;  Location: [place];  Service: Thoracic;  Laterality: N/A;  . BACK SURGERY    . CARDIAC CATHETERIZATION N/A 08/10/2015   Procedure: Left Heart Cath and Coronary Angiography;  Surgeon: [name], MD;  Location: [place] CV LAB;   Service: Cardiovascular;  Laterality: N/A;  . CARDIAC CATHETERIZATION  08/10/2015   Procedure: Coronary Balloon Angioplasty;  Surgeon: [name], MD;  Location: [place] CV LAB;  Service: Cardiovascular;;  . CHEST TUBE INSERTION Left 09/08/2015   Procedure: CHEST TUBE INSERTION;  Surgeon: [name], MD;  Location: [place];  Service: Thoracic;  Laterality: Left;  . COLONOSCOPY N/A 03/29/2013   Procedure: COLONOSCOPY;  Surgeon: Rogene Houston, MD;  [facility];  Service: Endoscopy;  Laterality: N/A;  930  . CORONARY ARTERY BYPASS GRAFT N/A 08/14/2015   Procedure: CORONARY ARTERY BYPASS GRAFTING (CABG)X3 LIMA-LAD; SVG-RAMUS; SVG-PD TRANSESOPHAGEAL ECHOCARDIOGRAM (TEE);  Surgeon: [name], MD;  Location: [place];  Service: Open Heart Surgery;  Laterality: N/A;  . PECTORALIS FLAP N/A 09/08/2015   Procedure: Muscle FLAP;  Surgeon: [name], DO;  Location: [place];  Service: Plastics;  Laterality: N/A;  . STERNAL WOUND DEBRIDEMENT N/A 09/01/2015   Procedure: STERNAL WOUND DEBRIDEMENT;  Surgeon: [name], MD;  Location: [place];  Service: Thoracic;  Laterality: N/A;  . STERNAL WOUND DEBRIDEMENT N/A 09/04/2015   Procedure: STERNAL WOUND DEBRIDEMENT;  Surgeon: [name], MD;  Location: [place];  Service: Thoracic;  Laterality: N/A;  . STERNAL WOUND DEBRIDEMENT N/A 09/08/2015   Procedure: STERNAL WOUND DEBRIDEMENT;  Surgeon: [name], MD;  Location: [place];  Service: Thoracic;  Laterality: N/A;  . TEE WITHOUT CARDIOVERSION N/A 08/14/2015   Procedure:  TRANSESOPHAGEAL ECHOCARDIOGRAM (TEE);  Surgeon: [name], MD;  Location: [place];  Service: Open Heart Surgery;  Laterality: N/A;     OB History   No obstetric history on file.     Family History  Problem Relation Age of Onset  . Colon cancer Neg Hx     Social History   Tobacco Use  . Smoking status: Never Smoker  . Smokeless tobacco: Never Used  Vaping Use  . Vaping Use: Never used    Substance Use Topics  . Alcohol use: No    Alcohol/week: 0.0 standard drinks  . Drug use: No    Home Medications Prior to Admission medications   Medication Sig Start Date End Date Taking? Authorizing Provider  amLODipine (NORVASC) 5 MG tablet Take 1 tablet (5 mg total) by mouth at bedtime. 05/21/19 12/20/19  Satira Sark, MD  atorvastatin (LIPITOR) 20 MG tablet Take 1 tablet (20 mg total) by mouth daily. 12/20/19 03/19/20  Satira Sark, MD  clopidogrel (PLAVIX) 75 MG tablet Take 75 mg by mouth daily.  05/14/19   [provider]  losartan-hydrochlorothiazide (HYZAAR) 100-25 MG tablet Take 1 tablet by mouth daily.  03/05/16   [provider]  metoprolol tartrate (LOPRESSOR) 50 MG tablet Take 1.5 tablets (75 mg total) by mouth 2 (two) times daily. 05/15/19   Satira Sark, MD  predniSONE (DELTASONE) 20 MG tablet Take 2 tablets (40 mg total) by mouth daily. 03/15/20   Virgel Manifold, MD    Allergies    Other and Peanut-containing drug products  Review of Systems   Review of Systems All systems reviewed and negative, other than as noted in HPI.  Physical Exam Updated Vital Signs BP (!) 168/83   Pulse (!) 51   Temp 97.7 F (36.5 C) (Oral)   Resp 16   Ht 5\' 9"  (1.753 m)   Wt 74.8 kg   SpO2 100%   BMI 24.37 kg/m   Physical Exam Vitals and nursing note reviewed.  Constitutional:      General: She is not in acute distress.    Appearance: She is well-developed.  HENT:     Head: Normocephalic and atraumatic.  Eyes:     General:        Right eye: No discharge.        Left eye: No discharge.     Conjunctiva/sclera: Conjunctivae normal.  Cardiovascular:     Rate and Rhythm: Normal rate and regular rhythm.     Heart sounds: Normal heart sounds. No murmur heard.  No friction rub. No gallop.   Pulmonary:     Effort: Pulmonary effort is normal. No respiratory distress.     Breath sounds: Normal breath sounds.  Abdominal:     General: There is no  distension.     Palpations: Abdomen is soft.     Tenderness: There is no abdominal tenderness.  Musculoskeletal:        General: No tenderness.     Cervical back: Neck supple.  Skin:    General: Skin is warm and dry.     Findings: No rash.  Neurological:     Mental Status: She is alert.  Psychiatric:        Behavior: Behavior normal.        Thought Content: Thought content normal.     ED Results / Procedures / Treatments   Labs (all labs ordered are listed, but only abnormal results are displayed) Labs Reviewed - No data to  display  EKG EKG Interpretation  Date/Time:  Saturday March 15 2020 07:37:49 EDT Ventricular Rate:  47 PR Interval:  292 QRS Duration: 88 QT Interval:  458 QTC Calculation: 405 R Axis:   22 Text Interpretation: Sinus bradycardia with 1st degree A-V block Low voltage QRS Nonspecific T wave abnormality Abnormal ECG Confirmed by Virgel Manifold [phone]) on 03/15/2020 7:45:41 AM   Radiology No results found.  Procedures Procedures (including critical care time)  Medications Ordered in ED Medications - No data to display  ED Course  I have reviewed the triage vital signs and the nursing notes.  Pertinent labs & imaging results that were available during my care of the patient were reviewed by me and considered in my medical decision making (see chart for details).    MDM Rules/Calculators/A&P                          79 year old female with pruritus.  She reports hives although I do not appreciate any currently on my exam.  No new exposures that she is aware of.  Afebrile.  No acute respiratory or GI complaints.  Will place on steroids.  Continue Benadryl as needed.  Final Clinical Impression(s) / ED Diagnoses Final diagnoses:  Pruritus    Rx / DC Orders ED Discharge Orders         Ordered    predniSONE (DELTASONE) 20 MG tablet  Daily        03/15/20 0745           Virgel Manifold, MD 03/19/20 1052

## 2020-03-20 DIAGNOSIS — L509 Urticaria, unspecified: Secondary | ICD-10-CM | POA: Diagnosis not present

## 2020-03-20 DIAGNOSIS — Z0001 Encounter for general adult medical examination with abnormal findings: Secondary | ICD-10-CM | POA: Diagnosis not present

## 2020-03-20 DIAGNOSIS — M25562 Pain in left knee: Secondary | ICD-10-CM | POA: Diagnosis not present

## 2020-03-20 DIAGNOSIS — I10 Essential (primary) hypertension: Secondary | ICD-10-CM | POA: Diagnosis not present

## 2020-04-07 DIAGNOSIS — I2510 Atherosclerotic heart disease of native coronary artery without angina pectoris: Secondary | ICD-10-CM | POA: Diagnosis not present

## 2020-04-07 DIAGNOSIS — M25562 Pain in left knee: Secondary | ICD-10-CM | POA: Diagnosis not present

## 2020-04-07 DIAGNOSIS — I10 Essential (primary) hypertension: Secondary | ICD-10-CM | POA: Diagnosis not present

## 2020-04-07 DIAGNOSIS — D696 Thrombocytopenia, unspecified: Secondary | ICD-10-CM | POA: Diagnosis not present

## 2020-04-07 DIAGNOSIS — R140 Abdominal distension (gaseous): Secondary | ICD-10-CM | POA: Diagnosis not present

## 2020-04-07 DIAGNOSIS — R7301 Impaired fasting glucose: Secondary | ICD-10-CM | POA: Diagnosis not present

## 2020-04-07 DIAGNOSIS — E782 Mixed hyperlipidemia: Secondary | ICD-10-CM | POA: Diagnosis not present

## 2020-04-07 DIAGNOSIS — I677 Cerebral arteritis, not elsewhere classified: Secondary | ICD-10-CM | POA: Diagnosis not present

## 2020-04-07 DIAGNOSIS — R001 Bradycardia, unspecified: Secondary | ICD-10-CM | POA: Diagnosis not present

## 2020-04-14 ENCOUNTER — Encounter: Payer: Self-pay | Admitting: *Deleted

## 2020-04-15 ENCOUNTER — Encounter: Payer: Self-pay | Admitting: Cardiology

## 2020-04-15 ENCOUNTER — Ambulatory Visit (INDEPENDENT_AMBULATORY_CARE_PROVIDER_SITE_OTHER): Payer: Medicare Other | Admitting: Cardiology

## 2020-04-15 VITALS — BP 150/78 | HR 57 | Ht 69.0 in | Wt 170.0 lb

## 2020-04-15 DIAGNOSIS — I25119 Atherosclerotic heart disease of native coronary artery with unspecified angina pectoris: Secondary | ICD-10-CM

## 2020-04-15 DIAGNOSIS — I10 Essential (primary) hypertension: Secondary | ICD-10-CM

## 2020-04-15 NOTE — Patient Instructions (Addendum)

## 2020-04-15 NOTE — Progress Notes (Signed)
Cardiology Office Note  Date: 04/15/2020   ID: Ondine, Gemme 07-Jun-1941, MRN [id number]  PCP:  Celene Squibb, MD  Cardiologist:  Rozann Lesches, MD Electrophysiologist:  None   Chief Complaint  Patient presents with  . Cardiac follow-up    History of Present Illness: [name] [name] is a 79 y.o. female last seen in May.  She presents for a follow-up visit.  She tells me that she has noticed elevation in blood pressure, at least one systolic around 710.  She was seen by her PCP Dr. Nevada Crane who increased her Norvasc from 5 mg to 10 mg daily.  I reviewed her recent home blood pressure checks, systolics are to large degree under 150 at this point.  She does not report any progressive angina symptoms on medical therapy.  She continues to exercise regularly.  We discussed diet and salt restriction.  Past Medical History:  Diagnosis Date  . CAD (coronary artery disease)    Multivessel, PTCA of posterior descending followed by CABG January 2017  . Essential hypertension   . ST elevation myocardial infarction (STEMI) of inferior wall [facility])    January 2017  . Stroke [facility])    April 2020  . Wound infection after surgery    Sternal wound infection requiring debridement, pectoralis muscle flap repair, wound [facility] February 2017    Past Surgical History:  Procedure Laterality Date  . ABDOMINAL HYSTERECTOMY    . APPENDECTOMY    . APPLICATION OF WOUND VAC N/A 09/04/2015   Procedure: WOUND VAC CHANGE;  Surgeon: [name], MD;  Location: [place];  Service: Thoracic;  Laterality: N/A;  . BACK SURGERY    . CARDIAC CATHETERIZATION N/A 08/10/2015   Procedure: Left Heart Cath and Coronary Angiography;  Surgeon: [name], MD;  Location: [place] CV LAB;  Service: Cardiovascular;  Laterality: N/A;  . CARDIAC CATHETERIZATION  08/10/2015   Procedure: Coronary Balloon Angioplasty;  Surgeon: [name], MD;  Location: [place] CV LAB;  Service: Cardiovascular;;  . CHEST TUBE  INSERTION Left 09/08/2015   Procedure: CHEST TUBE INSERTION;  Surgeon: [name], MD;  Location: [place];  Service: Thoracic;  Laterality: Left;  . COLONOSCOPY N/A 03/29/2013   Procedure: COLONOSCOPY;  Surgeon: Rogene Houston, MD;  [facility];  Service: Endoscopy;  Laterality: N/A;  930  . CORONARY ARTERY BYPASS GRAFT N/A 08/14/2015   Procedure: CORONARY ARTERY BYPASS GRAFTING (CABG)X3 LIMA-LAD; SVG-RAMUS; SVG-PD TRANSESOPHAGEAL ECHOCARDIOGRAM (TEE);  Surgeon: [name], MD;  Location: [place];  Service: Open Heart Surgery;  Laterality: N/A;  . PECTORALIS FLAP N/A 09/08/2015   Procedure: Muscle FLAP;  Surgeon: [name], DO;  Location: [place];  Service: Plastics;  Laterality: N/A;  . STERNAL WOUND DEBRIDEMENT N/A 09/01/2015   Procedure: STERNAL WOUND DEBRIDEMENT;  Surgeon: [name], MD;  Location: [place];  Service: Thoracic;  Laterality: N/A;  . STERNAL WOUND DEBRIDEMENT N/A 09/04/2015   Procedure: STERNAL WOUND DEBRIDEMENT;  Surgeon: [name], MD;  Location: [place];  Service: Thoracic;  Laterality: N/A;  . STERNAL WOUND DEBRIDEMENT N/A 09/08/2015   Procedure: STERNAL WOUND DEBRIDEMENT;  Surgeon: [name], MD;  Location: [place];  Service: Thoracic;  Laterality: N/A;  . TEE WITHOUT CARDIOVERSION N/A 08/14/2015   Procedure: TRANSESOPHAGEAL ECHOCARDIOGRAM (TEE);  Surgeon: [name], MD;  Location: [place];  Service: Open Heart Surgery;  Laterality: N/A;    Current Outpatient Medications  Medication  Sig Dispense Refill  . amLODipine (NORVASC) 5 MG tablet Take 10 mg by mouth daily.    Marland Kitchen aspirin EC 81 MG tablet Take 81 mg by mouth daily. Swallow whole.    Marland Kitchen atorvastatin (LIPITOR) 40 MG tablet Take 40 mg by mouth daily.    . Baclofen 5 MG TABS Take by mouth.    . clopidogrel (PLAVIX) 75 MG tablet Take 75 mg by mouth daily.     Marland Kitchen levocetirizine (XYZAL) 5 MG tablet Take 5 mg by mouth every evening.    Marland Kitchen losartan-hydrochlorothiazide (HYZAAR) 100-25 MG tablet  Take 1 tablet by mouth daily.     . metoprolol tartrate (LOPRESSOR) 50 MG tablet Take 1.5 tablets (75 mg total) by mouth 2 (two) times daily. 270 tablet 3   No current facility-administered medications for this visit.   Allergies:  Other and Peanut-containing drug products   ROS:  No syncope.  Physical Exam: VS:  BP (!) 150/78   Pulse (!) 57   Ht 5\' 9"  (1.753 m)   Wt 170 lb (77.1 kg)   SpO2 99%   BMI 25.10 kg/m , BMI Body mass index is 25.1 kg/m.  Wt Readings from Last 3 Encounters:  04/15/20 170 lb (77.1 kg)  03/15/20 165 lb (74.8 kg)  12/20/19 168 lb (76.2 kg)    General: Patient appears comfortable at rest. HEENT: Conjunctiva and lids normal, wearing a mask. Neck: Supple, no elevated JVP or carotid bruits, no thyromegaly. Lungs: Clear to auscultation, nonlabored breathing at rest. Cardiac: Regular rate and rhythm, no S3, soft systolic murmur. Extremities: No pitting edema, distal pulses 2+.  ECG:  An ECG dated 03/17/2020 was personally reviewed today and demonstrated:  Sinus bradycardia with prolonged PR interval and nonspecific T wave changes.  Recent Labwork:  March 2021: Hemoglobin 13.0, platelets 156, BUN 11, creatinine 0.87, potassium 4.0, AST 16, ALT 14, cholesterol 109, triglycerides 44, HDL 55, LDL 43, hemoglobin A1c 5.7%  Other Studies Reviewed Today:  Echocardiogram4/22/2020: 1. The left ventricle has normal systolic function with an ejection fraction of 60-65%. The cavity size was normal. Mild basal septal hypertrophy. Left ventricular diastolic parameters were normal. No evidence of left ventricular regional wall motion  abnormalities. 2. The mitral valve is grossly normal. There is mild mitral annular calcification present. 3. The tricuspid valve is grossly normal. 4. The aortic valve is tricuspid. Aortic valve regurgitation is mild by color flow Doppler. 5. The aortic root is normal in size and structure. 6. No atrial level shunt detected by color  flow Doppler. Agitated saline contrast was given intravenously to evaluate for intracardiac shunting. Saline contrast bubble study was negative, with no evidence of any interatrial shunt.  Cardiac monitor May 2020: 30-day event recorder reviewed.  Sinus rhythm was present.  Heart rate ranged from 45 bpm up to 122 bpm with average heart rate 63 bpm. Slow heart rate corresponded to sinus bradycardia and fast heart rate corresponded to sinus tachycardia.  There were no pauses or arrhythmias.  Rare PVCs noted.  Assessment and Plan:  1.  Essential hypertension, agree with current treatment including Norvasc which was recently uptitrated, Hyzaar, and Lopressor.  Continue regular exercise plan, sodium restriction also discussed.  Keep follow-up with Dr. Nevada Crane.  2.  CAD status post CABG in 2017.  She does not describe any angina at this time and continues to exercise regularly.  Continue aspirin, Lipitor, and Plavix.  Medication Adjustments/Labs and Tests Ordered: Current medicines are reviewed at length with the patient  today.  Concerns regarding medicines are outlined above.   Tests Ordered: No orders of the defined types were placed in this encounter.   Medication Changes: No orders of the defined types were placed in this encounter.   Disposition:  Follow up 6 months in the [place] office.  Signed, Satira Sark, MD, [facility] 04/15/2020 11:57 AM    [place] [facility] [place], [place],  [address] Phone: [phone]; Fax: [phone]

## 2020-04-21 DIAGNOSIS — R140 Abdominal distension (gaseous): Secondary | ICD-10-CM | POA: Diagnosis not present

## 2020-04-21 DIAGNOSIS — R001 Bradycardia, unspecified: Secondary | ICD-10-CM | POA: Diagnosis not present

## 2020-04-21 DIAGNOSIS — I10 Essential (primary) hypertension: Secondary | ICD-10-CM | POA: Diagnosis not present

## 2020-04-21 DIAGNOSIS — I2510 Atherosclerotic heart disease of native coronary artery without angina pectoris: Secondary | ICD-10-CM | POA: Diagnosis not present

## 2020-04-21 DIAGNOSIS — E782 Mixed hyperlipidemia: Secondary | ICD-10-CM | POA: Diagnosis not present

## 2020-05-19 ENCOUNTER — Ambulatory Visit: Payer: Medicare Other | Admitting: Cardiology

## 2020-09-03 DIAGNOSIS — R141 Gas pain: Secondary | ICD-10-CM | POA: Diagnosis not present

## 2020-09-03 DIAGNOSIS — I872 Venous insufficiency (chronic) (peripheral): Secondary | ICD-10-CM | POA: Diagnosis not present

## 2020-09-03 DIAGNOSIS — D696 Thrombocytopenia, unspecified: Secondary | ICD-10-CM | POA: Diagnosis not present

## 2020-09-03 DIAGNOSIS — I10 Essential (primary) hypertension: Secondary | ICD-10-CM | POA: Diagnosis not present

## 2020-09-03 DIAGNOSIS — Z712 Person consulting for explanation of examination or test findings: Secondary | ICD-10-CM | POA: Diagnosis not present

## 2020-09-03 DIAGNOSIS — R7301 Impaired fasting glucose: Secondary | ICD-10-CM | POA: Diagnosis not present

## 2020-10-15 NOTE — Progress Notes (Deleted)
Cardiology Office Note  Date: 10/15/2020   ID: Deshay, Kirstein [birth year], MRN [id number]  PCP:  Celene Squibb, MD  Cardiologist:  Rozann Lesches, MD Electrophysiologist:  None   No chief complaint on file.   History of Present Illness: [name] [name] is a 80 y.o. female last seen in September 2021.  Past Medical History:  Diagnosis Date  . CAD (coronary artery disease)    Multivessel, PTCA of posterior descending followed by CABG January 2017  . Essential hypertension   . ST elevation myocardial infarction (STEMI) of inferior wall [facility])    January 2017  . Stroke [facility])    April 2020  . Wound infection after surgery    Sternal wound infection requiring debridement, pectoralis muscle flap repair, wound [facility] February 2017    Past Surgical History:  Procedure Laterality Date  . ABDOMINAL HYSTERECTOMY    . APPENDECTOMY    . APPLICATION OF WOUND VAC N/A 09/04/2015   Procedure: WOUND VAC CHANGE;  Surgeon: [name], MD;  Location: [place];  Service: Thoracic;  Laterality: N/A;  . BACK SURGERY    . CARDIAC CATHETERIZATION N/A 08/10/2015   Procedure: Left Heart Cath and Coronary Angiography;  Surgeon: [name], MD;  Location: [place] [facility];  Service: Cardiovascular;  Laterality: N/A;  . CARDIAC CATHETERIZATION  08/10/2015   Procedure: Coronary Balloon Angioplasty;  Surgeon: [name], MD;  Location: [place] CV LAB;  Service: Cardiovascular;;  . CHEST TUBE INSERTION Left 09/08/2015   Procedure: CHEST TUBE INSERTION;  Surgeon: [name], MD;  Location: [place];  Service: Thoracic;  Laterality: Left;  . COLONOSCOPY N/A 03/29/2013   Procedure: COLONOSCOPY;  Surgeon: Rogene Houston, MD;  [facility];  Service: Endoscopy;  Laterality: N/A;  930  . CORONARY ARTERY BYPASS GRAFT N/A 08/14/2015   Procedure: CORONARY ARTERY BYPASS GRAFTING (CABG)X3 LIMA-LAD; SVG-RAMUS; SVG-PD TRANSESOPHAGEAL ECHOCARDIOGRAM (TEE);  Surgeon: [name], MD;   Location: [place];  Service: Open Heart Surgery;  Laterality: N/A;  . PECTORALIS FLAP N/A 09/08/2015   Procedure: Muscle FLAP;  Surgeon: [name], DO;  Location: [place];  Service: Plastics;  Laterality: N/A;  . STERNAL WOUND DEBRIDEMENT N/A 09/01/2015   Procedure: STERNAL WOUND DEBRIDEMENT;  Surgeon: [name], MD;  Location: [place];  Service: Thoracic;  Laterality: N/A;  . STERNAL WOUND DEBRIDEMENT N/A 09/04/2015   Procedure: STERNAL WOUND DEBRIDEMENT;  Surgeon: [name], MD;  Location: [place];  Service: Thoracic;  Laterality: N/A;  . STERNAL WOUND DEBRIDEMENT N/A 09/08/2015   Procedure: STERNAL WOUND DEBRIDEMENT;  Surgeon: [name], MD;  Location: [place];  Service: Thoracic;  Laterality: N/A;  . TEE WITHOUT CARDIOVERSION N/A 08/14/2015   Procedure: TRANSESOPHAGEAL ECHOCARDIOGRAM (TEE);  Surgeon: [name], MD;  Location: [place];  Service: Open Heart Surgery;  Laterality: N/A;    Current Outpatient Medications  Medication Sig Dispense Refill  . amLODipine (NORVASC) 5 MG tablet Take 10 mg by mouth daily.    Marland Kitchen aspirin EC 81 MG tablet Take 81 mg by mouth daily. Swallow whole.    Marland Kitchen atorvastatin (LIPITOR) 40 MG tablet Take 40 mg by mouth daily.    . Baclofen 5 MG TABS Take by mouth.    . clopidogrel (PLAVIX) 75 MG tablet Take 75 mg by mouth daily.     Marland Kitchen levocetirizine (XYZAL) 5 MG tablet Take 5 mg by mouth every evening.    Marland Kitchen  losartan-hydrochlorothiazide (HYZAAR) 100-25 MG tablet Take 1 tablet by mouth daily.     . metoprolol tartrate (LOPRESSOR) 50 MG tablet Take 1.5 tablets (75 mg total) by mouth 2 (two) times daily. 270 tablet 3   No current facility-administered medications for this visit.   Allergies:  Other and Peanut-containing drug products   Social History: The patient  reports that she has never smoked. She has never used smokeless tobacco. She reports that she does not drink alcohol and does not use drugs.   Family History: The patient's family history is  not on file.   ROS:  Please see the history of present illness. Otherwise, complete review of systems is positive for {NONE DEFAULTED:18576::"none"}.  All other systems are reviewed and negative.   Physical Exam: VS:  There were no vitals taken for this visit., BMI There is no height or weight on file to calculate BMI.  Wt Readings from Last 3 Encounters:  04/15/20 170 lb (77.1 kg)  03/15/20 165 lb (74.8 kg)  12/20/19 168 lb (76.2 kg)    General: Patient appears comfortable at rest. HEENT: Conjunctiva and lids normal, oropharynx clear with moist mucosa. Neck: Supple, no elevated JVP or carotid bruits, no thyromegaly. Lungs: Clear to auscultation, nonlabored breathing at rest. Cardiac: Regular rate and rhythm, no S3 or significant systolic murmur, no pericardial rub. Abdomen: Soft, nontender, no hepatomegaly, bowel sounds present, no guarding or rebound. Extremities: No pitting edema, distal pulses 2+. Skin: Warm and dry. Musculoskeletal: No kyphosis. Neuropsychiatric: Alert and oriented x3, affect grossly appropriate.  ECG:  An ECG dated 03/17/2020 was personally reviewed today and demonstrated:  Sinus bradycardia with prolonged PR interval and nonspecific T wave changes.  Recent Labwork:    Component Value Date/Time   CHOL 101 11/15/2018 0514   TRIG 34 11/15/2018 0514   HDL 50 11/15/2018 0514   CHOLHDL 2.0 11/15/2018 0514   VLDL 7 11/15/2018 0514   LDLCALC 44 11/15/2018 0514  March 2021: Hemoglobin 13.0, platelets 156, BUN 11, creatinine 0.87, potassium 4.0, AST 16, ALT 14, cholesterol 109, triglycerides 44, HDL 55, LDL 43, hemoglobin A1c 5.7%  Other Studies Reviewed Today:  Echocardiogram4/22/2020: 1. The left ventricle has normal systolic function with an ejection fraction of 60-65%. The cavity size was normal. Mild basal septal hypertrophy. Left ventricular diastolic parameters were normal. No evidence of left ventricular regional wall motion  abnormalities. 2. The  mitral valve is grossly normal. There is mild mitral annular calcification present. 3. The tricuspid valve is grossly normal. 4. The aortic valve is tricuspid. Aortic valve regurgitation is mild by color flow Doppler. 5. The aortic root is normal in size and structure. 6. No atrial level shunt detected by color flow Doppler. Agitated saline contrast was given intravenously to evaluate for intracardiac shunting. Saline contrast bubble study was negative, with no evidence of any interatrial shunt.  Cardiac monitor May 2020: 30-day event recorder reviewed. Sinus rhythm was present. Heart rate ranged from 45 bpm up to 122 bpm with average heart rate 63 bpm. Slow heart rate corresponded to sinus bradycardia and fast heart rate corresponded to sinus tachycardia. There were no pauses or arrhythmias. Rare PVCs noted.  Assessment and Plan:    Medication Adjustments/Labs and Tests Ordered: Current medicines are reviewed at length with the patient today.  Concerns regarding medicines are outlined above.   Tests Ordered: No orders of the defined types were placed in this encounter.   Medication Changes: No orders of the defined types were placed in  this encounter.   Disposition:  Follow up {follow up:15908}  Signed, Satira Sark, MD, [facility] 10/15/2020 2:41 PM    [facility] [place], [place], [place] [address] Phone: [phone]; Fax: [phone]

## 2020-10-16 ENCOUNTER — Ambulatory Visit: Payer: Medicare Other | Admitting: Cardiology

## 2020-10-16 DIAGNOSIS — I25119 Atherosclerotic heart disease of native coronary artery with unspecified angina pectoris: Secondary | ICD-10-CM

## 2020-10-27 DIAGNOSIS — I2510 Atherosclerotic heart disease of native coronary artery without angina pectoris: Secondary | ICD-10-CM | POA: Diagnosis not present

## 2020-10-27 DIAGNOSIS — K5900 Constipation, unspecified: Secondary | ICD-10-CM | POA: Diagnosis not present

## 2020-10-27 DIAGNOSIS — I10 Essential (primary) hypertension: Secondary | ICD-10-CM | POA: Diagnosis not present

## 2020-10-27 DIAGNOSIS — R141 Gas pain: Secondary | ICD-10-CM | POA: Diagnosis not present

## 2020-10-27 DIAGNOSIS — R7301 Impaired fasting glucose: Secondary | ICD-10-CM | POA: Diagnosis not present

## 2020-10-27 DIAGNOSIS — D696 Thrombocytopenia, unspecified: Secondary | ICD-10-CM | POA: Diagnosis not present

## 2020-10-27 DIAGNOSIS — R140 Abdominal distension (gaseous): Secondary | ICD-10-CM | POA: Diagnosis not present

## 2020-10-27 DIAGNOSIS — R001 Bradycardia, unspecified: Secondary | ICD-10-CM | POA: Diagnosis not present

## 2020-10-27 DIAGNOSIS — E782 Mixed hyperlipidemia: Secondary | ICD-10-CM | POA: Diagnosis not present

## 2020-11-17 ENCOUNTER — Encounter: Payer: Self-pay | Admitting: Physician Assistant

## 2020-11-17 ENCOUNTER — Other Ambulatory Visit: Payer: Self-pay

## 2020-11-17 ENCOUNTER — Ambulatory Visit: Payer: Medicare HMO | Admitting: Physician Assistant

## 2020-11-17 ENCOUNTER — Encounter (INDEPENDENT_AMBULATORY_CARE_PROVIDER_SITE_OTHER): Payer: Self-pay | Admitting: *Deleted

## 2020-11-17 VITALS — BP 130/80 | HR 60 | Ht 69.0 in | Wt 169.0 lb

## 2020-11-17 DIAGNOSIS — E782 Mixed hyperlipidemia: Secondary | ICD-10-CM

## 2020-11-17 DIAGNOSIS — R143 Flatulence: Secondary | ICD-10-CM | POA: Diagnosis not present

## 2020-11-17 DIAGNOSIS — R109 Unspecified abdominal pain: Secondary | ICD-10-CM | POA: Diagnosis not present

## 2020-11-17 DIAGNOSIS — I25119 Atherosclerotic heart disease of native coronary artery with unspecified angina pectoris: Secondary | ICD-10-CM

## 2020-11-17 DIAGNOSIS — I10 Essential (primary) hypertension: Secondary | ICD-10-CM | POA: Diagnosis not present

## 2020-11-17 DIAGNOSIS — K30 Functional dyspepsia: Secondary | ICD-10-CM | POA: Diagnosis not present

## 2020-11-17 MED ORDER — PANTOPRAZOLE SODIUM 40 MG PO TBEC
40.0000 mg | DELAYED_RELEASE_TABLET | Freq: Every day | ORAL | 3 refills | Status: DC
Start: 2020-11-17 — End: 2021-03-12

## 2020-11-17 NOTE — Progress Notes (Signed)
Cardiology Office Note    Date:  11/17/2020   ID:  Imya, Mance Feb 14, 1941, MRN [id number]   PCP:  Celene Squibb, [place]  Cardiologist:  Rozann Lesches, MD  Advanced Practice Provider:  No care team member to display Electrophysiologist:  None   [id number]}   Chief Complaint  Patient presents with  . Gas    History of Present Illness:  [name] [name] is a 80 y.o. female with history of CAD status post STEMI, PTCA followed by CABG in 9518 complicated by sternal wound infection, hypertension, echo 2020 normal LVEF 60 to 65%.  Patient last saw Dr. Domenic Polite 04/15/2020 at which time her amlodipine had been increased by PCP to 10 mg daily for elevated blood pressures.  Otherwise she was doing well.  Patient comes in today because at 3 am when she was awake she had a pain under her right shoulder blade and she had a lot of gas and indigestion.She took a pepcid and burped and it went away.  She says anything she eats gives her gas. Had spicy chicken for dinner.Raw vegetables, beans and dairy seem to bother her.Denies melena. Hasn't seen GI yet. She has been exercising and keloid on her chest gets irritated and it hurts-has seen plastics and got a lidocaine injection. Does a lot of weights and walks at least a mile a day but usually more. No chest tightness shortness of breath, dizziness or cardiac symptoms with exercise.   Past Medical History:  Diagnosis Date  . CAD (coronary artery disease)    Multivessel, PTCA of posterior descending followed by CABG January 2017  . Essential hypertension   . ST elevation myocardial infarction (STEMI) of inferior wall [facility])    January 2017  . Stroke [facility])    April 2020  . Wound infection after surgery    Sternal wound infection requiring debridement, pectoralis muscle flap repair, wound [facility] February 2017    Past Surgical History:  Procedure Laterality Date  . ABDOMINAL HYSTERECTOMY    . APPENDECTOMY    .  APPLICATION OF WOUND VAC N/A 09/04/2015   Procedure: WOUND VAC CHANGE;  Surgeon: [name], MD;  Location: [place];  Service: Thoracic;  Laterality: N/A;  . BACK SURGERY    . CARDIAC CATHETERIZATION N/A 08/10/2015   Procedure: Left Heart Cath and Coronary Angiography;  Surgeon: [name], MD;  Location: [place] [facility];  Service: Cardiovascular;  Laterality: N/A;  . CARDIAC CATHETERIZATION  08/10/2015   Procedure: Coronary Balloon Angioplasty;  Surgeon: [name], MD;  Location: [place] [facility];  Service: Cardiovascular;;  . CHEST TUBE INSERTION Left 09/08/2015   Procedure: CHEST TUBE INSERTION;  Surgeon: [name], MD;  Location: [place];  Service: Thoracic;  Laterality: Left;  . COLONOSCOPY N/A 03/29/2013   Procedure: COLONOSCOPY;  Surgeon: Rogene Houston, MD;  [facility];  Service: Endoscopy;  Laterality: N/A;  930  . CORONARY ARTERY BYPASS GRAFT N/A 08/14/2015   Procedure: CORONARY ARTERY BYPASS GRAFTING (CABG)X3 LIMA-LAD; SVG-RAMUS; SVG-PD TRANSESOPHAGEAL ECHOCARDIOGRAM (TEE);  Surgeon: [name], MD;  Location: [place];  Service: Open Heart Surgery;  Laterality: N/A;  . PECTORALIS FLAP N/A 09/08/2015   Procedure: Muscle FLAP;  Surgeon: [name], DO;  Location: [place];  Service: Plastics;  Laterality: N/A;  . STERNAL WOUND DEBRIDEMENT N/A 09/01/2015   Procedure: STERNAL WOUND DEBRIDEMENT;  Surgeon: [name], MD;  Location: [place] [facility];  Service: Thoracic;  Laterality: N/A;  . STERNAL WOUND DEBRIDEMENT N/A 09/04/2015   Procedure: STERNAL WOUND DEBRIDEMENT;  Surgeon: [name], MD;  Location: [place];  Service: Thoracic;  Laterality: N/A;  . STERNAL WOUND DEBRIDEMENT N/A 09/08/2015   Procedure: STERNAL WOUND DEBRIDEMENT;  Surgeon: [name], MD;  Location: [place];  Service: Thoracic;  Laterality: N/A;  . TEE WITHOUT CARDIOVERSION N/A 08/14/2015   Procedure: TRANSESOPHAGEAL ECHOCARDIOGRAM (TEE);  Surgeon: [name], MD;  Location:  [place];  Service: Open Heart Surgery;  Laterality: N/A;    Current Medications: Current Meds  Medication Sig  . amLODipine (NORVASC) 5 MG tablet Take 10 mg by mouth daily.  Marland Kitchen atorvastatin (LIPITOR) 40 MG tablet Take 40 mg by mouth daily.  . clopidogrel (PLAVIX) 75 MG tablet Take 75 mg by mouth daily.   Marland Kitchen losartan-hydrochlorothiazide (HYZAAR) 100-25 MG tablet Take 1 tablet by mouth daily.   . metoprolol tartrate (LOPRESSOR) 50 MG tablet Take 1.5 tablets (75 mg total) by mouth 2 (two) times daily.  . pantoprazole (PROTONIX) 40 MG tablet Take 1 tablet (40 mg total) by mouth daily.     Allergies:   Other and Peanut-containing drug products   Social History   Socioeconomic History  . Marital status: Divorced    Spouse name: Not on file  . Number of children: Not on file  . Years of education: Not on file  . Highest education level: Not on file  Occupational History  . Not on file  Tobacco Use  . Smoking status: Never Smoker  . Smokeless tobacco: Never Used  Vaping Use  . Vaping Use: Never used  Substance and Sexual Activity  . Alcohol use: No    Alcohol/week: 0.0 standard drinks  . Drug use: No  . Sexual activity: Yes    Partners: Male  Other Topics Concern  . Not on file  Social History Narrative  . Not on file   Social Determinants of Health   Financial Resource Strain: Not on file  Food Insecurity: Not on file  Transportation Needs: Not on file  Physical Activity: Not on file  Stress: Not on file  Social Connections: Not on file     Family History:  The patient's family history is not on file.   ROS:   Please see the history of present illness.    ROS All other systems reviewed and are negative.   PHYSICAL EXAM:   VS:  BP 130/80   Pulse 60   Ht 5\' 9"  (1.753 m)   Wt 169 lb (76.7 kg)   SpO2 97%   BMI 24.96 kg/m   Physical Exam  GEN: Well nourished, well developed, in no acute distress  Neck: no JVD, carotid bruits, or masses Cardiac:RRR; no murmurs,  rubs, or gallops  Respiratory:  clear to auscultation bilaterally, normal work of breathing GI: Slight tenderness left lower quadrant, nondistended, + BS Ext: without cyanosis, clubbing, or edema, Good distal pulses bilaterally Neuro:  Alert and Oriented x 3 Psych: euthymic mood, full affect  Wt Readings from Last 3 Encounters:  11/17/20 169 lb (76.7 kg)  04/15/20 170 lb (77.1 kg)  03/15/20 165 lb (74.8 kg)      Studies/Labs Reviewed:   EKG:  EKG is not ordered today.    Recent Labs: No results found for requested labs within last 8760 hours.   Lipid Panel    Component Value Date/Time   CHOL 101 11/15/2018 0514  TRIG 34 11/15/2018 0514   HDL 50 11/15/2018 0514   CHOLHDL 2.0 11/15/2018 0514   VLDL 7 11/15/2018 0514   LDLCALC 44 11/15/2018 0514    Additional studies/ records that were reviewed today include:  Echocardiogram 11/15/2018:  1. The left ventricle has normal systolic function with an ejection fraction of 60-65%. The cavity size was normal. Mild basal septal hypertrophy. Left ventricular diastolic parameters were normal. No evidence of left ventricular regional wall motion  abnormalities.  2. The mitral valve is grossly normal. There is mild mitral annular calcification present.  3. The tricuspid valve is grossly normal.  4. The aortic valve is tricuspid. Aortic valve regurgitation is mild by color flow Doppler.  5. The aortic root is normal in size and structure.  6. No atrial level shunt detected by color flow Doppler. Agitated saline contrast was given intravenously to evaluate for intracardiac shunting. Saline contrast bubble study was negative, with no evidence of any interatrial shunt.   Cardiac monitor May 2020: 30-day event recorder reviewed.  Sinus rhythm was present.  Heart rate ranged from 45 bpm up to 122 bpm with average heart rate 63 bpm. Slow heart rate corresponded to sinus bradycardia and fast heart rate corresponded to sinus tachycardia.  There were  no pauses or arrhythmias.  Rare PVCs noted.      Risk Assessment/Calculations:         ASSESSMENT:    1. Indigestion   2. Abdominal pain, unspecified abdominal location   3. Excessive gas   4. Coronary artery disease involving native coronary artery of native heart with angina pectoris ([place])   5. Essential hypertension   6. Mixed hyperlipidemia      PLAN:  In order of problems listed above:  Indigestion/gas/abdominal discomfort. Will give some protonix and refer to GI.  CAD status post STEMI, PTCA and CABG in 2017 on  Plavix, Lipitor-recent labs stable 08/2020  Hypertension BP controlled  Hyperlipidemia-labs reviewed 08/2020 and LDL 43 at goal  Shared Decision Making/Informed Consent        Medication Adjustments/Labs and Tests Ordered: Current medicines are reviewed at length with the patient today.  Concerns regarding medicines are outlined above.  Medication changes, Labs and Tests ordered today are listed in the Patient Instructions below. Patient Instructions  Medication Instructions:  Your physician has recommended you make the following change in your medication:   Start Protonix 40 mg Daily   *If you need a refill on your cardiac medications before your next appointment, please call your pharmacy*   Lab Work: NONE   If you have labs (blood work) drawn today and your tests are completely normal, you will receive your results only by: Marland Kitchen MyChart Message (if you have MyChart) OR . A paper copy in the mail If you have any lab test that is abnormal or we need to change your treatment, we will call you to review the results.   Testing/Procedures: NONE    Follow-Up: At [facility], you and your health needs are our priority.  As part of our continuing mission to provide you with exceptional heart care, we have created designated Provider Care Teams.  These Care Teams include your primary Cardiologist (physician) and Advanced Practice Providers (APPs -   Physician Assistants and Nurse Practitioners) who all work together to provide you with the care you need, when you need it.  We recommend signing up for the patient portal called "MyChart".  Sign up information is provided on this After Visit  Summary.  MyChart is used to connect with patients for Virtual Visits (Telemedicine).  Patients are able to view lab/test results, encounter notes, upcoming appointments, etc.  Non-urgent messages can be sent to your provider as well.   To learn more about what you can do with MyChart, go to [email or web address].    Your next appointment:   6 month(s)  The format for your next appointment:   In Person  Provider:   Rozann Lesches, MD   Other Instructions Thank you for choosing [facility]!       Signed, Ermalinda Barrios, PA-C  11/17/2020 2:17 PM    [facility] [address], [place], [place]  [address] Phone: [phone]; Fax: [phone]

## 2020-11-17 NOTE — Patient Instructions (Signed)
Medication Instructions:  Your physician has recommended you make the following change in your medication:   Start Protonix 40 mg Daily   *If you need a refill on your cardiac medications before your next appointment, please call your pharmacy*   Lab Work: NONE   If you have labs (blood work) drawn today and your tests are completely normal, you will receive your results only by: Marland Kitchen MyChart Message (if you have MyChart) OR . A paper copy in the mail If you have any lab test that is abnormal or we need to change your treatment, we will call you to review the results.   Testing/Procedures: NONE    Follow-Up: At [facility], you and your health needs are our priority.  As part of our continuing mission to provide you with exceptional heart care, we have created designated Provider Care Teams.  These Care Teams include your primary Cardiologist (physician) and Advanced Practice Providers (APPs -  Physician Assistants and Nurse Practitioners) who all work together to provide you with the care you need, when you need it.  We recommend signing up for the patient portal called "MyChart".  Sign up information is provided on this After Visit Summary.  MyChart is used to connect with patients for Virtual Visits (Telemedicine).  Patients are able to view lab/test results, encounter notes, upcoming appointments, etc.  Non-urgent messages can be sent to your provider as well.   To learn more about what you can do with MyChart, go to [email or web address].    Your next appointment:   6 month(s)  The format for your next appointment:   In Person  Provider:   Rozann Lesches, MD   Other Instructions Thank you for choosing [facility]!

## 2020-12-24 DIAGNOSIS — R252 Cramp and spasm: Secondary | ICD-10-CM | POA: Diagnosis not present

## 2021-03-06 DIAGNOSIS — I10 Essential (primary) hypertension: Secondary | ICD-10-CM | POA: Diagnosis not present

## 2021-03-09 DIAGNOSIS — I2510 Atherosclerotic heart disease of native coronary artery without angina pectoris: Secondary | ICD-10-CM | POA: Diagnosis not present

## 2021-03-09 DIAGNOSIS — N3281 Overactive bladder: Secondary | ICD-10-CM | POA: Diagnosis not present

## 2021-03-09 DIAGNOSIS — D696 Thrombocytopenia, unspecified: Secondary | ICD-10-CM | POA: Diagnosis not present

## 2021-03-09 DIAGNOSIS — R7301 Impaired fasting glucose: Secondary | ICD-10-CM | POA: Diagnosis not present

## 2021-03-09 DIAGNOSIS — Z0001 Encounter for general adult medical examination with abnormal findings: Secondary | ICD-10-CM | POA: Diagnosis not present

## 2021-03-09 DIAGNOSIS — I10 Essential (primary) hypertension: Secondary | ICD-10-CM | POA: Diagnosis not present

## 2021-03-09 DIAGNOSIS — E782 Mixed hyperlipidemia: Secondary | ICD-10-CM | POA: Diagnosis not present

## 2021-03-09 DIAGNOSIS — R109 Unspecified abdominal pain: Secondary | ICD-10-CM | POA: Diagnosis not present

## 2021-03-09 DIAGNOSIS — K5900 Constipation, unspecified: Secondary | ICD-10-CM | POA: Diagnosis not present

## 2021-03-12 ENCOUNTER — Ambulatory Visit (INDEPENDENT_AMBULATORY_CARE_PROVIDER_SITE_OTHER): Payer: Medicare HMO | Admitting: Gastroenterology

## 2021-03-12 ENCOUNTER — Other Ambulatory Visit: Payer: Self-pay

## 2021-03-12 ENCOUNTER — Encounter (INDEPENDENT_AMBULATORY_CARE_PROVIDER_SITE_OTHER): Payer: Self-pay | Admitting: Gastroenterology

## 2021-03-12 DIAGNOSIS — K581 Irritable bowel syndrome with constipation: Secondary | ICD-10-CM

## 2021-03-12 DIAGNOSIS — K589 Irritable bowel syndrome without diarrhea: Secondary | ICD-10-CM | POA: Insufficient documentation

## 2021-03-12 MED ORDER — LINACLOTIDE 72 MCG PO CAPS: 72.0000 ug | ORAL_CAPSULE | Freq: Every day | ORAL | 3 refills | Status: DC

## 2021-03-12 NOTE — Progress Notes (Signed)
[name] [name], M.D. Gastroenterology & Hepatology [facility] [address] [place], [place] [address] Primary Care Physician: Celene Squibb, MD [address] [place] [address]  Referring MD: PCP  Chief Complaint: Constipation and flatulence  History of Present Illness: [name] [name] is a 80 y.o. female with past medical history of coronary artery disease status post CABG, hypertension, history of STEMI, stroke and IBS-C, who presents for evaluation of constipation and flatulence.  Patient reports that for the last year, she has noticed recurrent episodes of flatulence with foul smell. She has also noticed her chronic constipation has gotten worse for the last year, as she is having a BM once a week. Also reports frequent bloating. She is taking colace for her constipation but it takes some days for it to be effective in changing her bowel movement frequency. Very rarely she has some episodes of hypogastric pain, which improves after she is able to move her bowels. She was previously Amitiza but she does not remember taking this medication as it was prescribed in 2014 -she was seen in our clinic at that time. She reports trying high dose Linzess in the past but she reports she felt sick and did not tolerate it.  Interestingly, she has also noticed eating roughage and diary makes the flatulence worse and causes more bloating.  The patient denies having any nausea, vomiting, fever, chills, hematochezia, melena, hematemesis, jaundice, pruritus or weight loss.  Last Colonoscopy:2014 - Examination performed to cecum. Small polyp ablated via cold biopsy from ascending colon. Few small diverticula and sigmoid colon. Small external hemorrhoids. Path SSL - asked to repeat in 5 years Cologuard was negative a year ago per the patient.  FHx: neg for any gastrointestinal/liver disease, no malignancies Social: neg smoking, alcohol or illicit  drug use Surgical: hysterectomy, appendectomy  Past Medical History: Past Medical History:  Diagnosis Date   CAD (coronary artery disease)    Multivessel, PTCA of posterior descending followed by CABG January 2017   Essential hypertension    ST elevation myocardial infarction (STEMI) of inferior wall [facility])    January 2017   Stroke [facility])    April 2020   Wound infection after surgery    Sternal wound infection requiring debridement, pectoralis muscle flap repair, wound [facility] February 2017    Past Surgical History: Past Surgical History:  Procedure Laterality Date   ABDOMINAL HYSTERECTOMY     APPENDECTOMY     APPLICATION OF WOUND VAC N/A 09/04/2015   Procedure: WOUND VAC CHANGE;  Surgeon: [name], MD;  Location: [place];  Service: Thoracic;  Laterality: N/A;   BACK SURGERY     CARDIAC CATHETERIZATION N/A 08/10/2015   Procedure: Left Heart Cath and Coronary Angiography;  Surgeon: [name], MD;  Location: [place] [facility];  Service: Cardiovascular;  Laterality: N/A;   CARDIAC CATHETERIZATION  08/10/2015   Procedure: Coronary Balloon Angioplasty;  Surgeon: [name], MD;  Location: [place] [facility];  Service: Cardiovascular;;   CHEST TUBE INSERTION Left 09/08/2015   Procedure: CHEST TUBE INSERTION;  Surgeon: [name], MD;  Location: [place];  Service: Thoracic;  Laterality: Left;   COLONOSCOPY N/A 03/29/2013   Procedure: COLONOSCOPY;  Surgeon: Rogene Houston, MD;  [facility];  Service: Endoscopy;  Laterality: N/A;  930   CORONARY ARTERY BYPASS GRAFT N/A 08/14/2015   Procedure: CORONARY ARTERY BYPASS GRAFTING (CABG)X3 LIMA-LAD; SVG-RAMUS; SVG-PD TRANSESOPHAGEAL ECHOCARDIOGRAM (TEE);  Surgeon: [name], MD;  Location: [place];  Service: Open Heart Surgery;  Laterality: N/A;   PECTORALIS FLAP N/A 09/08/2015   Procedure: Muscle FLAP;  Surgeon: [name], DO;  Location: [place];  Service: Plastics;  Laterality: N/A;   STERNAL WOUND  DEBRIDEMENT N/A 09/01/2015   Procedure: STERNAL WOUND DEBRIDEMENT;  Surgeon: [name], MD;  Location: [place];  Service: Thoracic;  Laterality: N/A;   STERNAL WOUND DEBRIDEMENT N/A 09/04/2015   Procedure: STERNAL WOUND DEBRIDEMENT;  Surgeon: [name], MD;  Location: [place];  Service: Thoracic;  Laterality: N/A;   STERNAL WOUND DEBRIDEMENT N/A 09/08/2015   Procedure: STERNAL WOUND DEBRIDEMENT;  Surgeon: [name], MD;  Location: [place];  Service: Thoracic;  Laterality: N/A;   TEE WITHOUT CARDIOVERSION N/A 08/14/2015   Procedure: TRANSESOPHAGEAL ECHOCARDIOGRAM (TEE);  Surgeon: [name], MD;  Location: [place];  Service: Open Heart Surgery;  Laterality: N/A;    Family History: Family History  Problem Relation Age of Onset   Colon cancer Neg Hx     Social History: Social History   Tobacco Use  Smoking Status Never  Smokeless Tobacco Never   Social History   Substance and Sexual Activity  Alcohol Use No   Alcohol/week: 0.0 standard drinks   Social History   Substance and Sexual Activity  Drug Use No    Allergies: Allergies  Allergen Reactions   Other Hives and Itching    Food Dye Red Dye #40   Peanut-Containing Drug Products Rash    Mild rash    Medications: Current Outpatient Medications  Medication Sig Dispense Refill   amLODipine (NORVASC) 10 MG tablet Take 10 mg by mouth daily.     atorvastatin (LIPITOR) 40 MG tablet Take 40 mg by mouth daily.     clopidogrel (PLAVIX) 75 MG tablet Take 75 mg by mouth daily.      losartan-hydrochlorothiazide (HYZAAR) 100-25 MG tablet Take 1 tablet by mouth daily.      metoprolol tartrate (LOPRESSOR) 50 MG tablet Take 1.5 tablets (75 mg total) by mouth 2 (two) times daily. 270 tablet 3   No current facility-administered medications for this visit.    Review of Systems: GENERAL: negative for malaise, night sweats HEENT: No changes in hearing or vision, no nose bleeds or other nasal problems. NECK: Negative for  lumps, goiter, pain and significant neck swelling RESPIRATORY: Negative for cough, wheezing CARDIOVASCULAR: Negative for chest pain, leg swelling, palpitations, orthopnea GI: SEE HPI MUSCULOSKELETAL: Negative for joint pain or swelling, back pain, and muscle pain. SKIN: Negative for lesions, rash PSYCH: Negative for sleep disturbance, mood disorder and recent psychosocial stressors. HEMATOLOGY Negative for prolonged bleeding, bruising easily, and swollen nodes. ENDOCRINE: Negative for cold or heat intolerance, polyuria, polydipsia and goiter. NEURO: negative for tremor, gait imbalance, syncope and seizures. The remainder of the review of systems is noncontributory.   Physical Exam: BP 128/77 (BP Location: Left Arm, Patient Position: Sitting, Cuff Size: Large)   Pulse 61   Temp 98.3 F (36.8 C) (Oral)   Ht '5\' 9"'$  (1.753 m)   Wt 167 lb 11.2 oz (76.1 kg)   BMI 24.76 kg/m  GENERAL: The patient is AO x3, in no acute distress. elder HEENT: Head is normocephalic and atraumatic. EOMI are intact. Mouth is well hydrated and without lesions. NECK: Supple. No masses LUNGS: Clear to auscultation. No presence of rhonchi/wheezing/rales. Adequate chest expansion HEART: RRR, normal s1 and s2. ABDOMEN: Soft, nontender, no guarding, no  peritoneal signs, and nondistended. BS +. No masses. EXTREMITIES: Without any cyanosis, clubbing, rash, lesions or edema. NEUROLOGIC: AOx3, no focal motor deficit. SKIN: no jaundice, no rashes  Imaging/Labs: as above  I personally reviewed and interpreted the available labs, imaging and endoscopic files.  Impression and Plan: [name] [name] is a 80 y.o. female with past medical history of coronary artery disease status post CABG, hypertension, history of STEMI, stroke and IBS-C, who presents for evaluation of constipation and flatulence.  Patient has presented chronic problems with her bowel movement frequency which has worsened recently and has now presented  worsening bloating and flatulence.  We discussed the importance of using a laxative to increase her bowel movement frequency, for which I will start her on low-dose Linzess as she did not tolerate higher doses but this may also provide improvement of her bloating.  As this is related to IBS-C, she will also benefit from implementing a low FODMAP diet, but if she wishes to eat vegetables she can attempt taking plan enzymes over-the-counter.  Patient understood and agreed.  Finally, she had presence of a sessile serrated polyp in the past and was supposed to have a repeat colonoscopy but she reports having a negative Cologuard last year and given her age no further colonoscopies are warranted for screening purposes.  - Explained presumed etiology of IBS symptoms. Patient was counseled about the benefit of implementing a low FODMAP to improve symptoms and recurrent episodes. A dietary list was provided to the patient. Also, the patient was counseled about the benefit of avoiding stressing situations and potential environmental triggers leading to symptomatology. - Can try taking plant enzymes if attempting to eat roughage - Start Linzess 72 mc g qday  All questions were answered.      [name] Peppers, MD Gastroenterology and Hepatology [facility]

## 2021-03-12 NOTE — Patient Instructions (Addendum)
Explained presumed etiology of IBS symptoms. Patient was counseled about the benefit of implementing a low FODMAP to improve symptoms and recurrent episodes. A dietary list was provided to the patient. Also, the patient was counseled about the benefit of avoiding stressing situations and potential environmental triggers leading to symptomatology. Can try taking plant enzymes if attempting to eat roughage Start Linzess 72 mc g qday

## 2021-06-04 DIAGNOSIS — H524 Presbyopia: Secondary | ICD-10-CM | POA: Diagnosis not present

## 2021-06-29 ENCOUNTER — Other Ambulatory Visit: Payer: Self-pay

## 2021-06-29 ENCOUNTER — Encounter: Payer: Self-pay | Admitting: Cardiology

## 2021-06-29 ENCOUNTER — Ambulatory Visit (INDEPENDENT_AMBULATORY_CARE_PROVIDER_SITE_OTHER): Payer: Medicare Other | Admitting: Cardiology

## 2021-06-29 VITALS — BP 158/84 | HR 55 | Ht 69.0 in | Wt 161.4 lb

## 2021-06-29 DIAGNOSIS — E782 Mixed hyperlipidemia: Secondary | ICD-10-CM | POA: Diagnosis not present

## 2021-06-29 DIAGNOSIS — I25119 Atherosclerotic heart disease of native coronary artery with unspecified angina pectoris: Secondary | ICD-10-CM

## 2021-06-29 DIAGNOSIS — I10 Essential (primary) hypertension: Secondary | ICD-10-CM | POA: Diagnosis not present

## 2021-06-29 NOTE — Patient Instructions (Signed)

## 2021-06-29 NOTE — Progress Notes (Signed)
Cardiology Office Note  Date: 06/29/2021   ID: [name], [name] 25-Nov-1940, MRN [id number]  PCP:  Celene Squibb, MD  Cardiologist:  Rozann Lesches, MD Electrophysiologist:  None   Chief Complaint  Patient presents with   Cardiac follow-up    History of Present Illness: [name] [name] is an 80 y.o. female last seen in April by [name] [name].  She is here for a follow-up visit.  She does not describe any active angina symptoms on current medications.  States that she walks about 3 miles most days of the week.  She has been having some discomfort in her left knee and ankle.  Otherwise, no sense of palpitations, NYHA class II dyspnea.  I reviewed her medications which are noted below.  She continues to follow with Dr. Nevada [name] for primary care.  Her last LDL was 37 in August on Lipitor.  I personally reviewed her ECG today which shows sinus bradycardia with prolonged PR interval and nonspecific ST-T changes.  Past Medical History:  Diagnosis Date   CAD (coronary artery disease)    Multivessel, PTCA of posterior descending followed by CABG January 2017   Essential hypertension    ST elevation myocardial infarction (STEMI) of inferior wall [facility])    January 2017   Stroke [facility])    April 2020   Wound infection after surgery    Sternal wound infection requiring debridement, pectoralis muscle flap repair, wound [facility] February 2017    Past Surgical History:  Procedure Laterality Date   ABDOMINAL HYSTERECTOMY     APPENDECTOMY     APPLICATION OF WOUND VAC N/A 09/04/2015   Procedure: WOUND VAC CHANGE;  Surgeon: [name], MD;  Location: [place];  Service: Thoracic;  Laterality: N/A;   BACK SURGERY     CARDIAC CATHETERIZATION N/A 08/10/2015   Procedure: Left Heart Cath and Coronary Angiography;  Surgeon: [name], MD;  Location: [place] [facility];  Service: Cardiovascular;  Laterality: N/A;   CARDIAC CATHETERIZATION  08/10/2015   Procedure: Coronary Balloon Angioplasty;   Surgeon: [name], MD;  Location: [place] [facility];  Service: Cardiovascular;;   CHEST TUBE INSERTION Left 09/08/2015   Procedure: CHEST TUBE INSERTION;  Surgeon: [name], MD;  Location: [place];  Service: Thoracic;  Laterality: Left;   COLONOSCOPY N/A 03/29/2013   Procedure: COLONOSCOPY;  Surgeon: Rogene Houston, MD;  [facility];  Service: Endoscopy;  Laterality: N/A;  930   CORONARY ARTERY BYPASS GRAFT N/A 08/14/2015   Procedure: CORONARY ARTERY BYPASS GRAFTING (CABG)X3 LIMA-LAD; SVG-RAMUS; SVG-PD TRANSESOPHAGEAL ECHOCARDIOGRAM (TEE);  Surgeon: [name], MD;  Location: [place];  Service: Open Heart Surgery;  Laterality: N/A;   PECTORALIS FLAP N/A 09/08/2015   Procedure: Muscle FLAP;  Surgeon: [name], DO;  Location: [place];  Service: Plastics;  Laterality: N/A;   STERNAL WOUND DEBRIDEMENT N/A 09/01/2015   Procedure: STERNAL WOUND DEBRIDEMENT;  Surgeon: [name], MD;  Location: [place];  Service: Thoracic;  Laterality: N/A;   STERNAL WOUND DEBRIDEMENT N/A 09/04/2015   Procedure: STERNAL WOUND DEBRIDEMENT;  Surgeon: [name], MD;  Location: [place];  Service: Thoracic;  Laterality: N/A;   STERNAL WOUND DEBRIDEMENT N/A 09/08/2015   Procedure: STERNAL WOUND DEBRIDEMENT;  Surgeon: [name], MD;  Location: [place];  Service: Thoracic;  Laterality: N/A;   TEE WITHOUT CARDIOVERSION N/A 08/14/2015   Procedure: TRANSESOPHAGEAL ECHOCARDIOGRAM (TEE);  Surgeon: [name], MD;  [facility];  Service: Open Heart Surgery;  Laterality: N/A;    Current Outpatient Medications  Medication Sig Dispense Refill   amLODipine (NORVASC) 10 MG tablet Take 10 mg by mouth daily.     atorvastatin (LIPITOR) 40 MG tablet Take 40 mg by mouth daily.     clopidogrel (PLAVIX) 75 MG tablet Take 75 mg by mouth daily.      linaclotide (LINZESS) 72 MCG capsule Take 1 capsule (72 mcg total) by mouth daily before breakfast. 90 capsule 3   losartan-hydrochlorothiazide  (HYZAAR) 100-25 MG tablet Take 1 tablet by mouth daily.      metoprolol tartrate (LOPRESSOR) 50 MG tablet Take 1.5 tablets (75 mg total) by mouth 2 (two) times daily. 270 tablet 3   No current facility-administered medications for this visit.   Allergies:  Other and Peanut-containing drug products   ROS: No orthopnea or PND.  Physical Exam: VS:  BP (!) 158/84   Pulse (!) 55   Ht 5\' 9"  (1.753 m)   Wt 161 lb 6.4 oz (73.2 kg)   SpO2 98%   BMI 23.83 kg/m , BMI Body mass index is 23.83 kg/m.  Wt Readings from Last 3 Encounters:  06/29/21 161 lb 6.4 oz (73.2 kg)  03/12/21 167 lb 11.2 oz (76.1 kg)  11/17/20 169 lb (76.7 kg)    General: Patient appears comfortable at rest. HEENT: Conjunctiva and lids normal, wearing a mask. Neck: Supple, no elevated JVP or carotid bruits, no thyromegaly. Lungs: Clear to auscultation, nonlabored breathing at rest. Cardiac: Regular rate and rhythm, no S3, 1/6 systolic murmur, no pericardial rub. Extremities: No pitting edema.  ECG:  An ECG dated 03/17/2020 was personally reviewed today and demonstrated:  Sinus bradycardia with prolonged PR interval and nonspecific T wave changes.  Recent Labwork:  August 2022: Cholesterol 106, triglycerides 48, HDL 57, LDL 37, BUN 12, creatinine 0.84, potassium 3.7, AST 16, ALT 15, hemoglobin 12.5, platelets 133  Other Studies Reviewed Today:  Echocardiogram 11/15/2018:  1. The left ventricle has normal systolic function with an ejection fraction of 60-65%. The cavity size was normal. Mild basal septal hypertrophy. Left ventricular diastolic parameters were normal. No evidence of left ventricular regional wall motion  abnormalities.  2. The mitral valve is grossly normal. There is mild mitral annular calcification present.  3. The tricuspid valve is grossly normal.  4. The aortic valve is tricuspid. Aortic valve regurgitation is mild by color flow Doppler.  5. The aortic root is normal in size and structure.  6. No  atrial level shunt detected by color flow Doppler. Agitated saline contrast was given intravenously to evaluate for intracardiac shunting. Saline contrast bubble study was negative, with no evidence of any interatrial shunt.   Cardiac monitor May 2020: 30-day event recorder reviewed.  Sinus rhythm was present.  Heart rate ranged from 45 bpm up to 122 bpm with average heart rate 63 bpm. Slow heart rate corresponded to sinus bradycardia and fast heart rate corresponded to sinus tachycardia.  There were no pauses or arrhythmias.  Rare PVCs noted.  Assessment and Plan:  1.  Multivessel CAD status post CABG in 2017.  She does not report any angina at this time and has good exercise tolerance.  ECG reviewed.  Continue Plavix, Norvasc, Lopressor, Hyzaar, and Lipitor.  2.  Mixed hyperlipidemia on Lipitor.  Last LDL 37.  She continues to follow with Dr. Nevada [name] for lab work.  Medication Adjustments/Labs and Tests Ordered: Current medicines are reviewed at length  with the patient today.  Concerns regarding medicines are outlined above.   Tests Ordered: Orders Placed This Encounter  Procedures   EKG 12-Lead    Medication Changes: No orders of the defined types were placed in this encounter.   Disposition:  Follow up  6 months.  Signed, Satira Sark, MD, [facility] 06/29/2021 4:45 PM    [facility] [place], [place], [place] [address] Phone: [phone]; Fax: [phone]

## 2021-07-07 ENCOUNTER — Other Ambulatory Visit (HOSPITAL_COMMUNITY): Payer: Self-pay | Admitting: Family Medicine

## 2021-07-07 DIAGNOSIS — M79605 Pain in left leg: Secondary | ICD-10-CM

## 2021-07-09 ENCOUNTER — Other Ambulatory Visit: Payer: Self-pay

## 2021-07-09 ENCOUNTER — Ambulatory Visit (HOSPITAL_COMMUNITY)
Admission: RE | Admit: 2021-07-09 | Discharge: 2021-07-09 | Disposition: A | Discharge status: Home / Self Care | Payer: Medicare Other | Source: Ambulatory Visit | Attending: Family Medicine | Admitting: Family Medicine

## 2021-07-09 DIAGNOSIS — M1712 Unilateral primary osteoarthritis, left knee: Secondary | ICD-10-CM | POA: Diagnosis not present

## 2021-07-09 DIAGNOSIS — M79605 Pain in left leg: Secondary | ICD-10-CM

## 2021-07-28 ENCOUNTER — Ambulatory Visit: Payer: Medicare Other | Admitting: Orthopedic Surgery

## 2021-07-30 ENCOUNTER — Ambulatory Visit: Payer: Medicare Other | Admitting: Orthopaedic Surgery

## 2021-08-06 ENCOUNTER — Other Ambulatory Visit: Payer: Self-pay

## 2021-08-06 ENCOUNTER — Ambulatory Visit (INDEPENDENT_AMBULATORY_CARE_PROVIDER_SITE_OTHER): Payer: Medicare Other | Admitting: Orthopaedic Surgery

## 2021-08-06 ENCOUNTER — Encounter: Payer: Self-pay | Admitting: Orthopaedic Surgery

## 2021-08-06 VITALS — BP 146/68 | HR 63 | Ht 69.0 in | Wt 162.0 lb

## 2021-08-06 DIAGNOSIS — M79672 Pain in left foot: Secondary | ICD-10-CM | POA: Diagnosis not present

## 2021-08-06 DIAGNOSIS — G8929 Other chronic pain: Secondary | ICD-10-CM | POA: Diagnosis not present

## 2021-08-06 DIAGNOSIS — M25562 Pain in left knee: Secondary | ICD-10-CM

## 2021-08-06 NOTE — Progress Notes (Signed)
Subjective:    Patient ID: [name] [name], female    DOB: [birth year], 81 y.o.   MRN: [id number]  HPI She has long history of left knee pain getting worse over the last few months.  She has no trauma.  She had surgery on the left knee as a child.  She has popping and swelling now and more recently giving way.  She has no redness, no numbness.  She has taken Tylenol and used heat and ice with only slight help.  The giving way bothers her.  She also has prominence of the posterior left heel area. That has been present for years.  She has some shoes she cannot wear.  She has no trauma, no redness.   Review of Systems  Constitutional:  Positive for activity change.  Musculoskeletal:  Positive for arthralgias, gait problem and joint swelling.  All other systems reviewed and are negative. For Review of Systems, all other systems reviewed and are negative.  The following is a summary of the past history medically, past history surgically, known current medicines, social history and family history.  This information is gathered electronically by the computer from prior information and documentation.  I review this each visit and have found including this information at this point in the chart is beneficial and informative.   Past Medical History:  Diagnosis Date   CAD (coronary artery disease)    Multivessel, PTCA of posterior descending followed by CABG January 2017   Essential hypertension    ST elevation myocardial infarction (STEMI) of inferior wall [facility]    January 2017   Stroke [facility])    April 2020   Wound infection after surgery    Sternal wound infection requiring debridement, pectoralis muscle flap repair, wound [facility] February 2017    Past Surgical History:  Procedure Laterality Date   ABDOMINAL HYSTERECTOMY     APPENDECTOMY     APPLICATION OF WOUND VAC N/A 09/04/2015   Procedure: WOUND VAC CHANGE;  Surgeon: [name], MD;  Location: [place];  Service: Thoracic;  Laterality: N/A;    BACK SURGERY     CARDIAC CATHETERIZATION N/A 08/10/2015   Procedure: Left Heart Cath and Coronary Angiography;  Surgeon: [name], MD;  Location: [place] CV [facility];  Service: Cardiovascular;  Laterality: N/A;   CARDIAC CATHETERIZATION  08/10/2015   Procedure: Coronary Balloon Angioplasty;  Surgeon: [name], MD;  Location: [place] [facility];  Service: Cardiovascular;;   CHEST TUBE INSERTION Left 09/08/2015   Procedure: CHEST TUBE INSERTION;  Surgeon: [name], MD;  Location: [place];  Service: Thoracic;  Laterality: Left;   COLONOSCOPY N/A 03/29/2013   Procedure: COLONOSCOPY;  Surgeon: Rogene Houston, MD;  [facility];  Service: Endoscopy;  Laterality: N/A;  930   CORONARY ARTERY BYPASS GRAFT N/A 08/14/2015   Procedure: CORONARY ARTERY BYPASS GRAFTING (CABG)X3 LIMA-LAD; SVG-RAMUS; SVG-PD TRANSESOPHAGEAL ECHOCARDIOGRAM (TEE);  Surgeon: [name], MD;  Location: [place];  Service: Open Heart Surgery;  Laterality: N/A;   PECTORALIS FLAP N/A 09/08/2015   Procedure: Muscle FLAP;  Surgeon: [name], DO;  Location: [place];  Service: Plastics;  Laterality: N/A;   STERNAL WOUND DEBRIDEMENT N/A 09/01/2015   Procedure: STERNAL WOUND DEBRIDEMENT;  Surgeon: [name], MD;  Location: [place];  Service: Thoracic;  Laterality: N/A;   STERNAL WOUND DEBRIDEMENT N/A 09/04/2015   Procedure: STERNAL WOUND DEBRIDEMENT;  Surgeon: [name], MD;  Location: [place];  Service: Thoracic;  Laterality: N/A;   STERNAL WOUND DEBRIDEMENT N/A 09/08/2015   Procedure: STERNAL WOUND DEBRIDEMENT;  Surgeon: [name], MD;  Location: [place];  Service: Thoracic;  Laterality: N/A;   TEE WITHOUT CARDIOVERSION N/A 08/14/2015   Procedure: TRANSESOPHAGEAL ECHOCARDIOGRAM (TEE);  Surgeon: [name], MD;  Location: [place];  Service: Open Heart Surgery;  Laterality: N/A;    Current Outpatient Medications on File Prior to Visit  Medication Sig Dispense Refill   amLODipine  (NORVASC) 10 MG tablet Take 10 mg by mouth daily.     atorvastatin (LIPITOR) 40 MG tablet Take 40 mg by mouth daily.     clopidogrel (PLAVIX) 75 MG tablet Take 75 mg by mouth daily.      linaclotide (LINZESS) 72 MCG capsule Take 1 capsule (72 mcg total) by mouth daily before breakfast. 90 capsule 3   losartan-hydrochlorothiazide (HYZAAR) 100-25 MG tablet Take 1 tablet by mouth daily.      metoprolol tartrate (LOPRESSOR) 50 MG tablet Take 1.5 tablets (75 mg total) by mouth 2 (two) times daily. 270 tablet 3   No current facility-administered medications on file prior to visit.    Social History   Socioeconomic History   Marital status: Divorced    Spouse name: Not on file   Number of children: Not on file   Years of education: Not on file   Highest education level: Not on file  Occupational History   Not on file  Tobacco Use   Smoking status: Never   Smokeless tobacco: Never  Vaping Use   Vaping Use: Never used  Substance and Sexual Activity   Alcohol use: No    Alcohol/week: 0.0 standard drinks   Drug use: No   Sexual activity: Yes    Partners: Male  Other Topics Concern   Not on file  Social History Narrative   Not on file   Social Determinants of Health   Financial Resource Strain: Not on file  Food Insecurity: Not on file  Transportation Needs: Not on file  Physical Activity: Not on file  Stress: Not on file  Social Connections: Not on file  Intimate Partner Violence: Not on file    Family History  Problem Relation Age of Onset   Colon cancer Neg Hx     BP (!) 146/68    Pulse 63    Ht 5\' 9"  (1.753 m)    Wt 162 lb (73.5 kg)    BMI 23.92 kg/m   Body mass index is 23.92 kg/m.     Objective:   Physical Exam Vitals and nursing note reviewed. Exam conducted with a chaperone present.  Constitutional:      Appearance: She is well-developed.  HENT:     Head: Normocephalic and atraumatic.  Eyes:     Conjunctiva/sclera: Conjunctivae normal.     Pupils:  Pupils are equal, round, and reactive to light.  Cardiovascular:     Rate and Rhythm: Normal rate and regular rhythm.  Pulmonary:     Effort: Pulmonary effort is normal.  Abdominal:     Palpations: Abdomen is soft.  Musculoskeletal:     Cervical back: Normal range of motion and neck supple.       Legs:  Skin:    General: Skin is warm and dry.  Neurological:     Mental Status: She is alert and oriented to person, place, and time.     Cranial Nerves: No cranial nerve deficit.     Motor: No  abnormal muscle tone.     Coordination: Coordination normal.     Deep Tendon Reflexes: Reflexes are normal and symmetric. Reflexes normal.  Psychiatric:        Behavior: Behavior normal.        Thought Content: Thought content normal.        Judgment: Judgment normal.  X-rays were done of the left knee, reported separately.        Assessment & Plan:   Encounter Diagnoses  Name Primary?   Chronic pain of left knee Yes   Chronic heel pain, left    I am concerned about possible meniscus tear left knee.  I will get MRI.  PROCEDURE NOTE:  The patient requests injections of the left knee , verbal consent was obtained.  The left knee was prepped appropriately after time out was performed.   Sterile technique was observed and injection of 1 cc of DepoMedrol 40 mg with several cc's of plain xylocaine. Anesthesia was provided by ethyl chloride and a 20-gauge needle was used to inject the knee area. The injection was tolerated well.  A band aid dressing was applied.  The patient was advised to apply ice later today and tomorrow to the injection sight as needed.  For the heel area, I suggested rubbing with Aspercreme, Biofreeze or using Voltaren Gel.  Check shoe wear.  I could get X-rays if not improved.  Return in two weeks.  Call if any problem.  Precautions discussed.  Electronically Signed Sanjuana Kava, MD 1/12/202310:55 AM

## 2021-08-18 ENCOUNTER — Other Ambulatory Visit: Payer: Self-pay

## 2021-08-18 ENCOUNTER — Ambulatory Visit (HOSPITAL_COMMUNITY)
Admission: RE | Admit: 2021-08-18 | Discharge: 2021-08-18 | Disposition: A | Discharge status: Home / Self Care | Payer: Medicare Other | Source: Ambulatory Visit | Attending: Orthopaedic Surgery | Admitting: Orthopaedic Surgery

## 2021-08-18 DIAGNOSIS — S83282A Other tear of lateral meniscus, current injury, left knee, initial encounter: Secondary | ICD-10-CM | POA: Diagnosis not present

## 2021-08-18 DIAGNOSIS — S83232A Complex tear of medial meniscus, current injury, left knee, initial encounter: Secondary | ICD-10-CM | POA: Diagnosis not present

## 2021-08-18 DIAGNOSIS — M25562 Pain in left knee: Secondary | ICD-10-CM | POA: Diagnosis not present

## 2021-08-18 DIAGNOSIS — G8929 Other chronic pain: Secondary | ICD-10-CM | POA: Diagnosis not present

## 2021-08-20 ENCOUNTER — Other Ambulatory Visit: Payer: Self-pay

## 2021-08-20 ENCOUNTER — Encounter: Payer: Self-pay | Admitting: Orthopaedic Surgery

## 2021-08-20 ENCOUNTER — Ambulatory Visit (INDEPENDENT_AMBULATORY_CARE_PROVIDER_SITE_OTHER): Payer: Medicare Other | Admitting: Orthopaedic Surgery

## 2021-08-20 DIAGNOSIS — S83242D Other tear of medial meniscus, current injury, left knee, subsequent encounter: Secondary | ICD-10-CM | POA: Diagnosis not present

## 2021-08-20 DIAGNOSIS — G8929 Other chronic pain: Secondary | ICD-10-CM

## 2021-08-20 NOTE — Patient Instructions (Signed)
Return to clinic to see Dr. Traci Sermon. [name] [name] to discuss surgery. To call us.

## 2021-08-20 NOTE — Progress Notes (Signed)
My knee still hurts.  She had the MRI of the left knee showing: IMPRESSION: 1. Moderate to severe patellofemoral and medial compartments, and mild-to-moderate lateral compartment cartilage degenerative changes. 2. Moderate mucoid degeneration of the superior ACL. No definite tear. 3. Diffuse complex tearing throughout the entire posterior horn of the medial meniscus. Complex undersurface tear of the posterior segment of the body of the medial meniscus. 4. Oblique undersurface tear of the posterior horn of the lateral meniscus.   She has pain, giving way and effusion.  I have explained the results and have recommended arthroscopy.   She wants to wait and thing about it. I have explained the procedure to her.  I have independently reviewed the MRI.    Left knee has effusion, pain, crepitus, positive medial McMurray, limp left, NV intact, no distal edema.  Encounter Diagnoses  Name Primary?   Chronic pain of left knee Yes   Other tear of medial meniscus, current injury, left knee, subsequent encounter    To call and see Dr. Loletha Grayer or Dr. Lemmie Evens.  Call if any problem.  Precautions discussed.  Electronically Signed Sanjuana Kava, MD 1/26/20238:53 AM

## 2021-09-08 DIAGNOSIS — R7301 Impaired fasting glucose: Secondary | ICD-10-CM | POA: Diagnosis not present

## 2021-09-08 DIAGNOSIS — E782 Mixed hyperlipidemia: Secondary | ICD-10-CM | POA: Diagnosis not present

## 2021-09-16 DIAGNOSIS — M25569 Pain in unspecified knee: Secondary | ICD-10-CM | POA: Diagnosis not present

## 2021-09-16 DIAGNOSIS — N3281 Overactive bladder: Secondary | ICD-10-CM | POA: Diagnosis not present

## 2021-09-16 DIAGNOSIS — Z8679 Personal history of other diseases of the circulatory system: Secondary | ICD-10-CM | POA: Diagnosis not present

## 2021-09-16 DIAGNOSIS — D696 Thrombocytopenia, unspecified: Secondary | ICD-10-CM | POA: Diagnosis not present

## 2021-09-16 DIAGNOSIS — I10 Essential (primary) hypertension: Secondary | ICD-10-CM | POA: Diagnosis not present

## 2021-09-16 DIAGNOSIS — I2510 Atherosclerotic heart disease of native coronary artery without angina pectoris: Secondary | ICD-10-CM | POA: Diagnosis not present

## 2021-09-16 DIAGNOSIS — E782 Mixed hyperlipidemia: Secondary | ICD-10-CM | POA: Diagnosis not present

## 2021-09-16 DIAGNOSIS — K5900 Constipation, unspecified: Secondary | ICD-10-CM | POA: Diagnosis not present

## 2021-09-16 DIAGNOSIS — R1319 Other dysphagia: Secondary | ICD-10-CM | POA: Diagnosis not present

## 2021-09-16 DIAGNOSIS — R109 Unspecified abdominal pain: Secondary | ICD-10-CM | POA: Diagnosis not present

## 2021-09-16 DIAGNOSIS — R7301 Impaired fasting glucose: Secondary | ICD-10-CM | POA: Diagnosis not present

## 2021-09-21 ENCOUNTER — Telehealth: Payer: Self-pay | Admitting: Orthopaedic Surgery

## 2021-09-21 NOTE — Telephone Encounter (Signed)
Per voice message received regarding appointment with one of our other 2 providers per last visit with Dr Luna Glasgow, I called back to patient; reached voice mail, left message to return call to discuss scheduling.

## 2021-09-24 ENCOUNTER — Telehealth: Payer: Self-pay | Admitting: Orthopaedic Surgery

## 2021-09-24 NOTE — Telephone Encounter (Signed)
Patient called and is wanting something for pain.  She states Dr.Keeling didn't prescribe her anything and she wants to do that for now before seeing Dr. Lemmie Evens or Dr. Loletha Grayer about surgery. ? ?Please call her back at [phone]. ?

## 2021-09-29 MED ORDER — HYDROCODONE-ACETAMINOPHEN 5-325 MG PO TABS: ORAL_TABLET | ORAL | 0 refills | Status: DC

## 2021-09-29 NOTE — Telephone Encounter (Signed)
I called patient and advised.  I also scheduled her for surg consult.   ?

## 2021-10-08 ENCOUNTER — Other Ambulatory Visit (HOSPITAL_COMMUNITY): Payer: Self-pay | Admitting: Specialist

## 2021-10-14 ENCOUNTER — Ambulatory Visit: Payer: Medicare Other | Admitting: Orthopedic Surgery

## 2021-10-14 ENCOUNTER — Encounter: Payer: Self-pay | Admitting: Orthopedic Surgery

## 2021-10-14 ENCOUNTER — Other Ambulatory Visit: Payer: Self-pay

## 2021-10-14 VITALS — BP 165/95 | HR 65 | Ht 69.0 in | Wt 167.6 lb

## 2021-10-14 DIAGNOSIS — S83242D Other tear of medial meniscus, current injury, left knee, subsequent encounter: Secondary | ICD-10-CM

## 2021-10-14 DIAGNOSIS — M1712 Unilateral primary osteoarthritis, left knee: Secondary | ICD-10-CM | POA: Diagnosis not present

## 2021-10-14 DIAGNOSIS — G8929 Other chronic pain: Secondary | ICD-10-CM

## 2021-10-14 NOTE — Progress Notes (Signed)
Encounter Diagnoses  ?Name Primary?  ? Chronic pain of left knee Yes  ? Other tear of medial meniscus, current injury, left knee, subsequent encounter   ? Primary osteoarthritis of left knee   ? ? ?Chief Complaint  ?Patient presents with  ? Surgical Consult  ?  LEFT knee/Dr.Keeling referred  ? ? ?Referral for possible surgical intervention regarding this patient who is 81 years old she has left knee pain is getting worse over the last few months she denied any trauma she did have surgery on the knee when she was a kid she presented with pain popping and swelling and some giving way she eventually had an MRI it showed she had torn meniscus undersurface tear posterior horn lateral meniscus complex tear posterior horn medial meniscus moderate to severe patellofemoral and medial compartment degenerative changes and mild to moderate lateral degenerative changes and degeneration of the ACL ? ?The patient has complained of posterior knee pain and pain along the anterior compartment into the foot as well.  She says it feels kind of like a nerve ? ?After reviewing her MRI and x-rays which do show some arthritis it seems to me that her pain right now is more nerve related.  She had surgery on her back when she was about 81 years old ? ?She and I agree at this point that she should wait on doing any surgery and I will see her on an as-needed basis ?

## 2021-10-20 ENCOUNTER — Encounter (HOSPITAL_COMMUNITY): Payer: Self-pay | Admitting: Speech Pathology

## 2021-10-20 ENCOUNTER — Other Ambulatory Visit: Payer: Self-pay

## 2021-10-20 ENCOUNTER — Ambulatory Visit (HOSPITAL_COMMUNITY): Payer: Medicare Other | Attending: Internal Medicine | Admitting: Speech Pathology

## 2021-10-20 ENCOUNTER — Ambulatory Visit (HOSPITAL_COMMUNITY)
Admission: RE | Admit: 2021-10-20 | Discharge: 2021-10-20 | Disposition: A | Discharge status: Home / Self Care | Payer: Medicare Other | Source: Ambulatory Visit | Attending: Internal Medicine | Admitting: Internal Medicine

## 2021-10-20 DIAGNOSIS — R1310 Dysphagia, unspecified: Secondary | ICD-10-CM | POA: Diagnosis not present

## 2021-10-20 DIAGNOSIS — R1312 Dysphagia, oropharyngeal phase: Secondary | ICD-10-CM | POA: Insufficient documentation

## 2021-10-20 DIAGNOSIS — Z85038 Personal history of other malignant neoplasm of large intestine: Secondary | ICD-10-CM | POA: Diagnosis not present

## 2021-10-20 DIAGNOSIS — R1319 Other dysphagia: Secondary | ICD-10-CM | POA: Diagnosis not present

## 2021-10-20 NOTE — Therapy (Signed)
[name] ?[name] ?[address] ?[place], [place], [address] ?Phone: [phone]   Fax:  [phone] ? ?Modified Barium Swallow ? ?Patient Details  ?Name: [name] [name] ?MRN: [id number] ?Date of Birth: 12-16-1940 ?No data recorded ? ?Encounter Date: 10/20/2021 ? ? End of Session - 10/20/21 1626   ? ? Visit Number 1   ? Number of Visits 1   ? Authorization Type [employer] Medicare   ? SLP Start Time 8546   ? SLP Stop Time  1330   ? SLP Time Calculation (min) 25 min   ? Activity Tolerance Patient tolerated treatment well   ? ?  ?  ? ?  ? ? ?Past Medical History:  ?Diagnosis Date  ? CAD (coronary artery disease)   ? Multivessel, PTCA of posterior descending followed by CABG January 2017  ? Essential hypertension   ? ST elevation myocardial infarction (STEMI) of inferior wall ([place])   ? January 2017  ? Stroke [facility])   ? April 2020  ? Wound infection after surgery   ? Sternal wound infection requiring debridement, pectoralis muscle flap repair, wound VAC February 2017  ? ? ?Past Surgical History:  ?Procedure Laterality Date  ? ABDOMINAL HYSTERECTOMY    ? APPENDECTOMY    ? APPLICATION OF WOUND VAC N/A 09/04/2015  ? Procedure: WOUND VAC CHANGE;  Surgeon: [name], MD;  Location: [place];  Service: Thoracic;  Laterality: N/A;  ? BACK SURGERY    ? CARDIAC CATHETERIZATION N/A 08/10/2015  ? Procedure: Left Heart Cath and Coronary Angiography;  Surgeon: [name], MD;  Location: [place] [facility];  Service: Cardiovascular;  Laterality: N/A;  ? CARDIAC CATHETERIZATION  08/10/2015  ? Procedure: Coronary Balloon Angioplasty;  Surgeon: [name], MD;  Location: [place] [facility];  Service: Cardiovascular;;  ? CHEST TUBE INSERTION Left 09/08/2015  ? Procedure: CHEST TUBE INSERTION;  Surgeon: [name], MD;  Location: [place];  Service: Thoracic;  Laterality: Left;  ? COLONOSCOPY N/A 03/29/2013  ? Procedure: COLONOSCOPY;  Surgeon: Rogene Houston, MD;  [facility];   Service: Endoscopy;  Laterality: N/A;  930  ? CORONARY ARTERY BYPASS GRAFT N/A 08/14/2015  ? Procedure: CORONARY ARTERY BYPASS GRAFTING (CABG)X3 LIMA-LAD; SVG-RAMUS; SVG-PD TRANSESOPHAGEAL ECHOCARDIOGRAM (TEE);  Surgeon: [name], MD;  Location: [place];  Service: Open Heart Surgery;  Laterality: N/A;  ? PECTORALIS FLAP N/A 09/08/2015  ? Procedure: Muscle FLAP;  Surgeon: [name], DO;  Location: [place];  Service: Plastics;  Laterality: N/A;  ? STERNAL WOUND DEBRIDEMENT N/A 09/01/2015  ? Procedure: STERNAL WOUND DEBRIDEMENT;  Surgeon: [name], MD;  Location: [place];  Service: Thoracic;  Laterality: N/A;  ? STERNAL WOUND DEBRIDEMENT N/A 09/04/2015  ? Procedure: STERNAL WOUND DEBRIDEMENT;  Surgeon: [name], MD;  Location: [place];  Service: Thoracic;  Laterality: N/A;  ? STERNAL WOUND DEBRIDEMENT N/A 09/08/2015  ? Procedure: STERNAL WOUND DEBRIDEMENT;  Surgeon: [name], MD;  Location: [place];  Service: Thoracic;  Laterality: N/A;  ? TEE WITHOUT CARDIOVERSION N/A 08/14/2015  ? Procedure: TRANSESOPHAGEAL ECHOCARDIOGRAM (TEE);  Surgeon: [name], MD;  Location: [place];  Service: Open Heart Surgery;  Laterality: N/A;  ? ? ?There were no vitals filed for this visit. ? ? Subjective Assessment - 10/20/21 1602   ? ? Subjective "It feels like something is in my throat when I swallow."   ? Special Tests MBSS   ?  Currently in Pain? No/denies   ? ?  ?  ? ?  ? ? ? ? ? ? General - 10/20/21 1605   ? ?  ? General Information  ? Date of Onset 09/23/21   ? HPI [name] [name] is an 81 yo female who was referred by Dr. Celene Squibb for MBSS due to Pt reports of globus sensation when swallowing (both with and without food). Pt also reports sinus drainage. She does not state that she has difficulty with any specific foods/liquids.   ? Type of Study MBS-Modified Barium Swallow Study   ? Diet Prior to this Study Regular;Thin liquids   ? Temperature Spikes Noted No   ? Respiratory Status Room air   ? History of  Recent Intubation No   ? Behavior/Cognition Alert;Cooperative;Pleasant mood   ? Oral Cavity Assessment Within Functional Limits   ? Oral Care Completed by SLP No   ? Oral Cavity - Dentition Adequate natural dentition   ? Vision Functional for self feeding   ? Self-Feeding Abilities Able to feed self   ? Patient Positioning Upright in chair   ? Baseline Vocal Quality Normal   ? Volitional Cough Strong   ? Volitional Swallow Able to elicit   ? Anatomy Within functional limits   ? Pharyngeal Secretions Not observed secondary MBS   ? ?  ?  ? ?  ? ? ? ? ? Oral Preparation/Oral Phase - 10/20/21 1609   ? ?  ? Oral Preparation/Oral Phase  ? Oral Phase Within functional limits   ?  ? Electrical stimulation - Oral Phase  ? Was Electrical Stimulation Used No   ? ?  ?  ? ?  ? ? ? Pharyngeal Phase - 10/20/21 1609   ? ?  ? Pharyngeal Phase  ? Pharyngeal Phase Within functional limits   ?  ? Electrical Stimulation - Pharyngeal Phase  ? Was Electrical Stimulation Used No   ? ?  ?  ? ?  ? ? ? Cricopharyngeal Phase - 10/20/21 1610   ? ?  ? Cervical Esophageal Phase  ? Cervical Esophageal Phase Impaired   ?  ? Cervical Esophageal Phase - Solids  ? Pill Other (Comment)   ?  ? Cervical Esophageal Phase - Comment  ? Other Esophageal Phase Observations Stasis of barium tablet in thoracic esophagus despite liquid wash, but did clear after a bite of puree   ? ?  ?  ? ?  ? ? ? Plan - 10/20/21 1628   ? ? Clinical Impression Statement Pt presents with normal oropharyngeal swallow function on MBSS this date. Pt with swallow trigger at the level of the valleculae across consistencies and textures. Pt with adequate hyolaryngeal excursion and laryngeal vestibule closure. Pt swallowed the barium tablet with cup sip of thin and barium tablet became transiently delayed in the thoracic esophagus. It was not removed with straw sips thin wash, but did clear after puree wash. Pt did report globus sensation at the end of the study, which followed the  barium tablet. She pointed to her neck as source of globus (possible referred symptoms from pill in esophagus). Recommend regular textures and thin liquids and Pt may wish to follow up with ENT/GI due to globus sensation and reports of "drainage". Pt was given esophageal swallowing precautions and encouraged to keep a "swallow" journal to note any symptoms she has. No further SLP services indicated at this time.   ? Consulted and Agree with Plan of  Care Patient   ? ?  ?  ? ?  ? ? ?Patient will benefit from skilled therapeutic intervention in order to improve the following deficits and impairments:   ?Dysphagia, oropharyngeal phase ? ? ? ? Recommendations/Treatment - 10/20/21 1613   ? ?  ? Swallow Evaluation Recommendations  ? Recommended Consults Consider ENT evaluation   due to globus sensation and c/o drainage  ? SLP Diet Recommendations Age appropriate regular;Thin   ? Liquid Administration via Cup;Straw   ? Medication Administration Whole meds with liquid   ? Supervision Patient able to self feed   ? Postural Changes Seated upright at 90 degrees;Remain upright for at least 30 minutes after feeds/meals   ? ?  ?  ? ?  ? ? ? Prognosis - 10/20/21 1624   ? ?  ? Prognosis  ? Prognosis for Safe Diet Advancement Good   ?  ? Individuals Consulted  ? Consulted and Agree with Results and Recommendations Patient   ? Report Sent to  Referring physician   ? ?  ?  ? ?  ? ? ?Problem List ?Patient Active Problem List  ? Diagnosis Date Noted  ? IBS (irritable bowel syndrome) 03/12/2021  ? Hypertrophic scar 12/25/2019  ? Acute CVA (cerebrovascular accident) ([place]) 11/15/2018  ? TIA (transient ischemic attack) 11/15/2018  ? Wound infection after surgery 09/01/2015  ? Retrosternal abscess ([place]) 08/31/2015  ? Sternal wound dehiscence 08/31/2015  ? S/P CABG x 3 08/14/2015  ? CAD (coronary artery disease), native coronary artery 08/11/2015  ? CAD (coronary artery disease)   ? Acute inferior myocardial infarction ([place]) 08/10/2015  ?  Acute MI, inferior wall, initial episode of care [facility])   ? Essential hypertension 02/19/2015  ? ?Thank you, ? ?Genene Churn, [place] ?[phone] ? ?Monta Maiorana, CCC-SLP ?10/20/2021, 4:40 PM ? ?[place] ?Annie P

## 2022-01-18 ENCOUNTER — Encounter: Payer: Self-pay | Admitting: Cardiology

## 2022-01-18 ENCOUNTER — Ambulatory Visit: Payer: Medicare Other | Admitting: Cardiology

## 2022-01-18 VITALS — BP 156/72 | HR 56 | Ht 69.0 in | Wt 170.8 lb

## 2022-01-18 DIAGNOSIS — E782 Mixed hyperlipidemia: Secondary | ICD-10-CM | POA: Diagnosis not present

## 2022-01-18 DIAGNOSIS — I25119 Atherosclerotic heart disease of native coronary artery with unspecified angina pectoris: Secondary | ICD-10-CM

## 2022-01-18 DIAGNOSIS — I10 Essential (primary) hypertension: Secondary | ICD-10-CM | POA: Diagnosis not present

## 2022-03-15 DIAGNOSIS — R7301 Impaired fasting glucose: Secondary | ICD-10-CM | POA: Diagnosis not present

## 2022-03-15 DIAGNOSIS — E782 Mixed hyperlipidemia: Secondary | ICD-10-CM | POA: Diagnosis not present

## 2022-03-19 ENCOUNTER — Encounter: Payer: Self-pay | Admitting: Internal Medicine

## 2022-03-19 DIAGNOSIS — R109 Unspecified abdominal pain: Secondary | ICD-10-CM | POA: Diagnosis not present

## 2022-03-19 DIAGNOSIS — R1319 Other dysphagia: Secondary | ICD-10-CM | POA: Diagnosis not present

## 2022-03-19 DIAGNOSIS — E870 Hyperosmolality and hypernatremia: Secondary | ICD-10-CM | POA: Diagnosis not present

## 2022-03-19 DIAGNOSIS — I10 Essential (primary) hypertension: Secondary | ICD-10-CM | POA: Diagnosis not present

## 2022-03-19 DIAGNOSIS — E782 Mixed hyperlipidemia: Secondary | ICD-10-CM | POA: Diagnosis not present

## 2022-03-19 DIAGNOSIS — R7301 Impaired fasting glucose: Secondary | ICD-10-CM | POA: Diagnosis not present

## 2022-03-19 DIAGNOSIS — Z8679 Personal history of other diseases of the circulatory system: Secondary | ICD-10-CM | POA: Diagnosis not present

## 2022-03-19 DIAGNOSIS — M25569 Pain in unspecified knee: Secondary | ICD-10-CM | POA: Diagnosis not present

## 2022-03-19 DIAGNOSIS — N3281 Overactive bladder: Secondary | ICD-10-CM | POA: Diagnosis not present

## 2022-03-19 DIAGNOSIS — D696 Thrombocytopenia, unspecified: Secondary | ICD-10-CM | POA: Diagnosis not present

## 2022-03-19 DIAGNOSIS — I2510 Atherosclerotic heart disease of native coronary artery without angina pectoris: Secondary | ICD-10-CM | POA: Diagnosis not present

## 2022-03-19 DIAGNOSIS — K5900 Constipation, unspecified: Secondary | ICD-10-CM | POA: Diagnosis not present

## 2022-04-23 ENCOUNTER — Other Ambulatory Visit (HOSPITAL_COMMUNITY): Payer: Self-pay | Admitting: Internal Medicine

## 2022-04-23 DIAGNOSIS — Z0000 Encounter for general adult medical examination without abnormal findings: Secondary | ICD-10-CM | POA: Diagnosis not present

## 2022-04-23 DIAGNOSIS — Z13820 Encounter for screening for osteoporosis: Secondary | ICD-10-CM

## 2022-04-23 DIAGNOSIS — I10 Essential (primary) hypertension: Secondary | ICD-10-CM | POA: Diagnosis not present

## 2022-04-23 DIAGNOSIS — Z1231 Encounter for screening mammogram for malignant neoplasm of breast: Secondary | ICD-10-CM

## 2022-04-23 DIAGNOSIS — E785 Hyperlipidemia, unspecified: Secondary | ICD-10-CM | POA: Diagnosis not present

## 2022-04-27 DIAGNOSIS — J069 Acute upper respiratory infection, unspecified: Secondary | ICD-10-CM | POA: Diagnosis not present

## 2022-04-27 DIAGNOSIS — Z23 Encounter for immunization: Secondary | ICD-10-CM | POA: Diagnosis not present

## 2022-07-01 DIAGNOSIS — R350 Frequency of micturition: Secondary | ICD-10-CM | POA: Diagnosis not present

## 2022-07-16 ENCOUNTER — Ambulatory Visit: Payer: Medicare Other | Admitting: Cardiology

## 2022-08-31 DIAGNOSIS — Z961 Presence of intraocular lens: Secondary | ICD-10-CM | POA: Diagnosis not present

## 2022-08-31 DIAGNOSIS — E119 Type 2 diabetes mellitus without complications: Secondary | ICD-10-CM | POA: Diagnosis not present

## 2022-09-03 DIAGNOSIS — I10 Essential (primary) hypertension: Secondary | ICD-10-CM | POA: Diagnosis not present

## 2022-09-03 DIAGNOSIS — J019 Acute sinusitis, unspecified: Secondary | ICD-10-CM | POA: Diagnosis not present

## 2022-09-22 DIAGNOSIS — R7301 Impaired fasting glucose: Secondary | ICD-10-CM | POA: Diagnosis not present

## 2022-09-22 DIAGNOSIS — E782 Mixed hyperlipidemia: Secondary | ICD-10-CM | POA: Diagnosis not present

## 2022-09-29 DIAGNOSIS — R7303 Prediabetes: Secondary | ICD-10-CM | POA: Diagnosis not present

## 2022-09-29 DIAGNOSIS — M25562 Pain in left knee: Secondary | ICD-10-CM | POA: Diagnosis not present

## 2022-09-29 DIAGNOSIS — J309 Allergic rhinitis, unspecified: Secondary | ICD-10-CM | POA: Diagnosis not present

## 2022-09-29 DIAGNOSIS — E870 Hyperosmolality and hypernatremia: Secondary | ICD-10-CM | POA: Diagnosis not present

## 2022-09-29 DIAGNOSIS — D696 Thrombocytopenia, unspecified: Secondary | ICD-10-CM | POA: Diagnosis not present

## 2022-09-29 DIAGNOSIS — I119 Hypertensive heart disease without heart failure: Secondary | ICD-10-CM | POA: Diagnosis not present

## 2022-09-29 DIAGNOSIS — I2510 Atherosclerotic heart disease of native coronary artery without angina pectoris: Secondary | ICD-10-CM | POA: Diagnosis not present

## 2022-09-29 DIAGNOSIS — N3281 Overactive bladder: Secondary | ICD-10-CM | POA: Diagnosis not present

## 2022-09-29 DIAGNOSIS — E782 Mixed hyperlipidemia: Secondary | ICD-10-CM | POA: Diagnosis not present

## 2022-09-29 DIAGNOSIS — I700 Atherosclerosis of aorta: Secondary | ICD-10-CM | POA: Diagnosis not present

## 2022-09-29 DIAGNOSIS — I10 Essential (primary) hypertension: Secondary | ICD-10-CM | POA: Diagnosis not present

## 2022-09-29 DIAGNOSIS — Z8679 Personal history of other diseases of the circulatory system: Secondary | ICD-10-CM | POA: Diagnosis not present

## 2022-10-26 NOTE — Progress Notes (Signed)
    Cardiology Office Note  Date: 10/27/2022   ID: [name], [name] 07/07/1941, MRN [id number]  History of Present Illness: [name] [name] is an 82 y.o. female last seen in June 2023.  She is here for a routine visit.  Reports no angina or breathlessness beyond NYHA class II.  Still works in [employer] at [employer] in [place].  She does not report any orthopnea or PND, no palpitations or syncope.  I reviewed her medications.  She does have fairly easy bruising and her platelet count is mildly reduced at 115.  Discussed stopping Plavix and replacing with a baby aspirin.  Cardiac regimen is otherwise stable.  She has done well on Lipitor with last LDL 41.  I reviewed her ECG today which shows sinus bradycardia with prolonged PR interval and nonspecific ST-T changes.  Physical Exam: VS:  BP (!) 142/80   Pulse 60   Ht 5\' 9"  (1.753 m)   Wt 170 lb 9.6 oz (77.4 kg)   SpO2 97%   BMI 25.19 kg/m , BMI Body mass index is 25.19 kg/m.  Wt Readings from Last 3 Encounters:  10/27/22 170 lb 9.6 oz (77.4 kg)  01/18/22 170 lb 12.8 oz (77.5 kg)  10/14/21 167 lb 9.6 oz (76 kg)    General: Patient appears comfortable at rest. HEENT: Conjunctiva and lids normal. Neck: Supple, no elevated JVP or carotid bruits. Lungs: Clear to auscultation, nonlabored breathing at rest. Cardiac: Regular rate and rhythm, no S3, 1/6 systolic murmur. Extremities: No pitting edema.  ECG:  An ECG dated 06/29/2021 was personally reviewed today and demonstrated:  Sinus bradycardia with prolonged PR interval, nonspecific ST-T changes.  Labwork:  February 2024: Hemoglobin 12.1, platelets 115, BUN 16, creatinine 0.69, potassium 3.6, AST 16, ALT 13, cholesterol 110, triglycerides 47, HDL 57, LDL 41  Other Studies Reviewed Today:  No interval cardiac testing for review today.  Assessment and Plan:  1.  Multivessel CAD status post angioplasty of the PDA followed by CABG in 2017 with LIMA to LAD, SVG to ramus, and  SVG to PDA.  Postoperative course complicated by sternal wound infection.  LVEF 60 to 65% by echocardiogram in April 2020.  Plan to stop Plavix and replace it with aspirin 81 mg daily.  Continue Lipitor.  2.  Essential hypertension.  Continue Norvasc, Hyzaar, and Lopressor.  Keep follow-up with Dr. Margo Aye.  Recent lab work shows normal renal function and potassium.  3.  Mixed hyperlipidemia.  LDL 41 on Lipitor.  Disposition:  Follow up  1 year.  Signed, Jonelle Sidle, M.D., F.A.C.C. [facility]

## 2022-10-27 ENCOUNTER — Ambulatory Visit: Payer: HMO | Attending: Cardiology | Admitting: Cardiology

## 2022-10-27 ENCOUNTER — Encounter: Payer: Self-pay | Admitting: Cardiology

## 2022-10-27 VITALS — BP 142/80 | HR 60 | Ht 69.0 in | Wt 170.6 lb

## 2022-10-27 DIAGNOSIS — I25119 Atherosclerotic heart disease of native coronary artery with unspecified angina pectoris: Secondary | ICD-10-CM | POA: Diagnosis not present

## 2022-10-27 DIAGNOSIS — I10 Essential (primary) hypertension: Secondary | ICD-10-CM | POA: Diagnosis not present

## 2022-10-27 DIAGNOSIS — E782 Mixed hyperlipidemia: Secondary | ICD-10-CM | POA: Diagnosis not present

## 2022-10-27 MED ORDER — ASPIRIN 81 MG PO TBEC: 81.0000 mg | DELAYED_RELEASE_TABLET | Freq: Every day | ORAL | 3 refills | Status: AC

## 2022-10-27 NOTE — Patient Instructions (Signed)
Medication Instructions:  Your physician has recommended you make the following change in your medication:   -Stop Plavix -Start Aspirin 81 mg tablet once daily.   *If you need a refill on your cardiac medications before your next appointment, please call your pharmacy*   Lab Work: None If you have labs (blood work) drawn today and your tests are completely normal, you will receive your results only by: [place] (if you have MyChart) OR A paper copy in the mail If you have any lab test that is abnormal or we need to change your treatment, we will call you to review the results.   Testing/Procedures: None   Follow-Up: At [facility], you and your health needs are our priority.  As part of our continuing mission to provide you with exceptional heart care, we have created designated Provider Care Teams.  These Care Teams include your primary Cardiologist (physician) and Advanced Practice Providers (APPs -  Physician Assistants and Nurse Practitioners) who all work together to provide you with the care you need, when you need it.  We recommend signing up for the patient portal called "MyChart".  Sign up information is provided on this After Visit Summary.  MyChart is used to connect with patients for Virtual Visits (Telemedicine).  Patients are able to view lab/test results, encounter notes, upcoming appointments, etc.  Non-urgent messages can be sent to your provider as well.   To learn more about what you can do with MyChart, go to [email or web address].    Your next appointment:   1 year(s)  Provider:   Rozann Lesches, MD    Other Instructions

## 2022-11-26 DIAGNOSIS — R197 Diarrhea, unspecified: Secondary | ICD-10-CM | POA: Insufficient documentation

## 2022-11-26 DIAGNOSIS — F458 Other somatoform disorders: Secondary | ICD-10-CM | POA: Insufficient documentation

## 2022-11-26 DIAGNOSIS — K440 Diaphragmatic hernia with obstruction, without gangrene: Secondary | ICD-10-CM | POA: Insufficient documentation

## 2022-11-29 ENCOUNTER — Ambulatory Visit (INDEPENDENT_AMBULATORY_CARE_PROVIDER_SITE_OTHER): Payer: HMO | Admitting: Orthopedic Surgery

## 2022-11-29 ENCOUNTER — Other Ambulatory Visit (INDEPENDENT_AMBULATORY_CARE_PROVIDER_SITE_OTHER): Payer: HMO

## 2022-11-29 ENCOUNTER — Encounter: Payer: Self-pay | Admitting: Orthopedic Surgery

## 2022-11-29 VITALS — BP 163/80 | HR 78 | Ht 69.0 in | Wt 170.0 lb

## 2022-11-29 DIAGNOSIS — M7662 Achilles tendinitis, left leg: Secondary | ICD-10-CM

## 2022-11-29 DIAGNOSIS — M25572 Pain in left ankle and joints of left foot: Secondary | ICD-10-CM

## 2022-11-29 NOTE — Progress Notes (Signed)
Chief Complaint  Patient presents with   Ankle Pain    Left    82 year old female presents with 1 year history of pain Achilles tendon left heel.  No trauma.  Prior treatment over-the-counter Tylenol unsuccessful in relieving pain and Celebrex on and off for 1 year 100 mg a day  Pain starts in the heel radiates proximally behind the left leg up towards the knee   Review of systems history of back pain possible radicular symptoms in the left leg  Past Medical History:  Diagnosis Date   CAD (coronary artery disease)    Multivessel, PTCA of posterior descending followed by CABG January 2017   Essential hypertension    ST elevation myocardial infarction (STEMI) of inferior wall [facility])    January 2017   Stroke [facility])    April 2020   Wound infection after surgery    Sternal wound infection requiring debridement, pectoralis muscle flap repair, wound VAC February 2017   BP (!) 163/80   Pulse 78   Ht 5\' 9"  (1.753 m)   Wt 170 lb (77.1 kg)   BMI 25.10 kg/m   She is awake alert and oriented x 3 mood and affect are normal  Normal color perfusion capillary refill left foot  Normal range of motion left ankle with tenderness and prominence around the Achilles and there is a spur or prominence of the heel in the area of the Achilles but the Achilles tendon itself is intact ankle is stable  X-rays show normal ankle joint with large posterior calcaneal spur  Encounter Diagnoses  Name Primary?   Pain in left ankle and joints of left foot Yes   Achilles tendinitis, left leg     Celebrex 100 mg twice a day  Therapy 6 weeks  Return in 6 weeks

## 2022-11-29 NOTE — Patient Instructions (Signed)
Take Celebrex 100 mg twice a day  Start physical therapy  Follow-up in 6 weeks  Diagnosis Achilles tendinitis and bone spur

## 2022-12-22 ENCOUNTER — Ambulatory Visit (HOSPITAL_COMMUNITY): Payer: HMO | Admitting: Physical Therapy

## 2023-01-07 DIAGNOSIS — H6690 Otitis media, unspecified, unspecified ear: Secondary | ICD-10-CM | POA: Diagnosis not present

## 2023-01-07 DIAGNOSIS — H6692 Otitis media, unspecified, left ear: Secondary | ICD-10-CM | POA: Diagnosis not present

## 2023-01-10 ENCOUNTER — Ambulatory Visit: Payer: HMO | Admitting: Orthopedic Surgery

## 2023-01-31 ENCOUNTER — Other Ambulatory Visit: Payer: Self-pay

## 2023-01-31 ENCOUNTER — Encounter (HOSPITAL_COMMUNITY): Payer: Self-pay | Admitting: Physical Therapy

## 2023-01-31 ENCOUNTER — Ambulatory Visit (HOSPITAL_COMMUNITY): Payer: HMO | Attending: Orthopedic Surgery | Admitting: Physical Therapy

## 2023-01-31 DIAGNOSIS — R29898 Other symptoms and signs involving the musculoskeletal system: Secondary | ICD-10-CM | POA: Diagnosis not present

## 2023-01-31 DIAGNOSIS — M25572 Pain in left ankle and joints of left foot: Secondary | ICD-10-CM | POA: Insufficient documentation

## 2023-01-31 DIAGNOSIS — R2689 Other abnormalities of gait and mobility: Secondary | ICD-10-CM | POA: Diagnosis not present

## 2023-01-31 NOTE — Therapy (Signed)
OUTPATIENT PHYSICAL THERAPY LOWER EXTREMITY EVALUATION   Patient Name: [name] [name] MRN: [id number] DOB:10/24/1940, 82 y.o., female Today's Date: 01/31/2023  END OF SESSION:  PT End of Session - 01/31/23 1300     Visit Number 1    Number of Visits 8    Date for PT Re-Evaluation 02/28/23    Authorization Type [employer]    PT Start Time 1301    PT Stop Time 1343    PT Time Calculation (min) 42 min    Activity Tolerance Patient tolerated treatment well    Behavior During Therapy [facility] for tasks assessed/performed             Past Medical History:  Diagnosis Date   CAD (coronary artery disease)    Multivessel, PTCA of posterior descending followed by CABG January 2017   Essential hypertension    ST elevation myocardial infarction (STEMI) of inferior wall [facility])    January 2017   Stroke [facility])    April 2020   Wound infection after surgery    Sternal wound infection requiring debridement, pectoralis muscle flap repair, wound [facility] February 2017   Past Surgical History:  Procedure Laterality Date   ABDOMINAL HYSTERECTOMY     APPENDECTOMY     APPLICATION OF WOUND VAC N/A 09/04/2015   Procedure: WOUND VAC CHANGE;  Surgeon: Kerin Perna, MD;  Location: [facility] OR;  Service: Thoracic;  Laterality: N/A;   BACK SURGERY     CARDIAC CATHETERIZATION N/A 08/10/2015   Procedure: Left Heart Cath and Coronary Angiography;  Surgeon: Corky Crafts, MD;  Location: [facility] INVASIVE CV LAB;  Service: Cardiovascular;  Laterality: N/A;   CARDIAC CATHETERIZATION  08/10/2015   Procedure: Coronary Balloon Angioplasty;  Surgeon: Corky Crafts, MD;  Location: [facility] INVASIVE CV LAB;  Service: Cardiovascular;;   CHEST TUBE INSERTION Left 09/08/2015   Procedure: CHEST TUBE INSERTION;  Surgeon: Kerin Perna, MD;  Location: [facility] OR;  Service: Thoracic;  Laterality: Left;   COLONOSCOPY N/A 03/29/2013   Procedure: COLONOSCOPY;  Surgeon: Malissa Hippo, MD;  Location: [facility] ENDO [facility]ITE;  Service:  Endoscopy;  Laterality: N/A;  930   CORONARY ARTERY BYPASS GRAFT N/A 08/14/2015   Procedure: CORONARY ARTERY BYPASS GRAFTING (CABG)X3 LIMA-LAD; SVG-RAMUS; SVG-PD TRANSESOPHAGEAL ECHOCARDIOGRAM (TEE);  Surgeon: Kerin Perna, MD;  Location: [facility] OR;  Service: Open Heart Surgery;  Laterality: N/A;   PECTORALIS FLAP N/A 09/08/2015   Procedure: Muscle FLAP;  Surgeon: Alena Bills Dillingham, DO;  Location: [facility] OR;  Service: Plastics;  Laterality: N/A;   STERNAL WOUND DEBRIDEMENT N/A 09/01/2015   Procedure: STERNAL WOUND DEBRIDEMENT;  Surgeon: Kerin Perna, MD;  Location[facility] OR;  Service: Thoracic;  Laterality: N/A;   STERNAL WOUND DEBRIDEMENT N/A 09/04/2015   Procedure: STERNAL WOUND DEBRIDEMENT;  Surgeon: Kerin Perna, MD;  Location: [facility] OR;  Service: Thoracic;  Laterality: N/A;   STERNAL WOUND DEBRIDEMENT N/A 09/08/2015   Procedure: STERNAL WOUND DEBRIDEMENT;  Surgeon: Kerin Perna, MD;  Location: [facility] OR;  Service: Thoracic;  Laterality: N/A;   TEE WITHOUT CARDIOVERSION N/A 08/14/2015   Procedure: TRANSESOPHAGEAL ECHOCARDIOGRAM (TEE);  Surgeon: Kerin Perna, MD;  Location: [facility] OR;  Service: Open Heart Surgery;  Laterality: N/A;   Patient Active Problem List   Diagnosis Date Noted   Diaphragmatic hernia with obstruction 11/26/2022   Diarrhea 11/26/2022   Other somatoform disorders 11/26/2022   IBS (irritable bowel syndrome) 03/12/2021   Hypertrophic scar 12/25/2019   Acute CVA (cerebrovascular accident) (HCC)  11/15/2018   TIA (transient ischemic attack) 11/15/2018   Wound infection after surgery 09/01/2015   Retrosternal abscess (HCC) 08/31/2015   Sternal wound dehiscence 08/31/2015   S/P CABG x 3 08/14/2015   CAD (coronary artery disease), native coronary artery 08/11/2015   CAD (coronary artery disease)    Acute inferior myocardial infarction (HCC) 08/10/2015   Acute MI, inferior wall, initial episode of care [facility])    Essential hypertension 02/19/2015   Left knee pain 01/08/2014   AV  block, 1st degree 11/21/2008   Chest pain 11/21/2008   Esophageal reflux 11/21/2008   Disorder resulting from impaired renal function 11/29/2007   Dyslipidemia 11/29/2007   Impaired fasting glucose 03/06/2007   Menopausal syndrome 03/30/2006   Constipation 09/16/2004   Venous insufficiency 05/21/2003   Allergic rhinitis 11/17/2001   Herpes zoster 06/02/1999   Edema 05/28/1999    PCP: Benita Stabile MD  REFERRING PROVIDER: Vickki Hearing, MD  REFERRING DIAG: [phone] (ICD-10-CM) - Pain in left ankle and joints of left foot, Achillies tendonitis with bone spur  THERAPY DIAG:  Pain in left ankle and joints of left foot  Other abnormalities of gait and mobility  Other symptoms and signs involving the musculoskeletal system  Rationale for Evaluation and Treatment: Rehabilitation  ONSET DATE: 1 year  SUBJECTIVE:   SUBJECTIVE STATEMENT: Patient states her L heel hurts. She has a bone spur and some tendonitis. Some aching and heaviness in L calf. Symptoms began with insidious onset. Symptoms have improved with Celebrex. Patient states heel is tender. Symptoms in calf mostly come every couple of months. Tried heel supports in shoe but did didn't help.   PERTINENT HISTORY: CAD, HTN, Hx MI, Hx, CVA, Large calcaneal spur near the Achilles tendon insertion PAIN:  Are you having pain? Yes: NPRS scale: 2/10 Pain location: L heel Pain description: burning Aggravating factors: walking Relieving factors: sitting  PRECAUTIONS: None  WEIGHT BEARING RESTRICTIONS: No  FALLS:  Has patient fallen in last 6 months? No  OCCUPATION: retired   PLOF: Independent  PATIENT GOALS: more movement in L leg   OBJECTIVE:   DIAGNOSTIC FINDINGS: Imaging shows normal ankle joint normal ankle mortise osteopenia tibia and fibula large calcaneal spur on the posterior aspect of the calcaneus  Large calcaneal spur near the Achilles tendon insertion normal ankle joint  PATIENT SURVEYS: FOTO  complete next session  COGNITION: Overall cognitive status: Within functional limits for tasks assessed     SENSATION: WFL  EDEMA: edema in feet bilaterally    POSTURE: No Significant postural limitations  PALPATION: No TTP throughout bilateral LE  LOWER EXTREMITY ROM:  Active ROM Right eval Left eval  Hip flexion    Hip extension    Hip abduction    Hip adduction    Hip internal rotation    Hip external rotation    Knee flexion    Knee extension    Ankle dorsiflexion 1 2  Ankle plantarflexion 48 45  Ankle inversion 30 33  Ankle eversion 15 10   (Blank rows = not tested)  LOWER EXTREMITY MMT:  MMT Right eval Left eval  Hip flexion    Hip extension    Hip abduction    Hip adduction    Hip internal rotation    Hip external rotation    Knee flexion 5 5  Knee extension 5 5  Ankle dorsiflexion 5 5  Ankle plantarflexion    Ankle inversion 5 5  Ankle eversion 5 5   (Blank  rows = not tested) Single leg heel raise: able to complete bilaterally without symptoms   FUNCTIONAL TESTS:  SLS: R 10 seconds, L: <3 seconds Stairs: 7 inch, able to complete with alternating pattern with good mechanics and unilateral UE support  GAIT: Distance walked: 226 feet Assistive device utilized: None Level of assistance: Complete Independence Comments: decreased L ankle DF   TODAY'S TREATMENT:                                                                                                                              DATE:  01/31/23 Heel raise 1 x 10  Toe raise 1 x 10     PATIENT EDUCATION:  Education details: Patient educated on exam findings, POC, scope of PT, HEP Person educated: Patient Education method: Programmer, multimedia, Demonstration, and Handouts Education comprehension: verbalized understanding, returned demonstration, verbal cues required, and tactile cues required  HOME EXERCISE PROGRAM: Access Code: [email or web address] URL: [email or web address]  Date:  01/31/2023 - Heel Raises with Counter Support  - 3 x daily - 7 x weekly - 2 sets - 10 reps - Toe Raises with Counter Support  - 3 x daily - 7 x weekly - 2 sets - 10 reps  ASSESSMENT:  CLINICAL IMPRESSION: Patient a 82 y.o. y.o. female who was seen today for physical therapy evaluation and treatment for L ankle pain. Patient presents with pain limited deficits in L ankle strength, ROM, endurance, activity tolerance, gait, balance, and functional mobility with ADL. Patient is having to modify and restrict ADL as indicated by outcome measure score as well as subjective information and objective measures which is affecting overall participation. Patient will benefit from skilled physical therapy in order to improve function and reduce impairment.  OBJECTIVE IMPAIRMENTS: Abnormal gait, decreased activity tolerance, decreased balance, decreased endurance, decreased mobility, difficulty walking, decreased strength, impaired flexibility, improper body mechanics, and pain.   ACTIVITY LIMITATIONS: bending, standing, squatting, stairs, transfers, locomotion level, and caring for others  PARTICIPATION LIMITATIONS: meal prep, cleaning, laundry, shopping, community activity, and yard work  PERSONAL FACTORS: Age, Time since onset of injury/illness/exacerbation, and 3+ comorbidities: CAD, HTN, Hx MI, Hx, CVA, Large calcaneal spur near the Achilles tendon insertion  are also affecting patient's functional outcome.   REHAB POTENTIAL: Good  CLINICAL DECISION MAKING: Stable/uncomplicated  EVALUATION COMPLEXITY: Low   GOALS: Goals reviewed with patient? Yes  SHORT TERM GOALS: Target date: 02/14/2023    Patient will be independent with HEP in order to improve functional outcomes. Baseline: Goal status: INITIAL  2.  Patient will report at least 25% improvement in symptoms for improved quality of life. Baseline: Goal status: INITIAL   LONG TERM GOALS: Target date: 02/28/2023    Patient will report at  least 75% improvement in symptoms for improved quality of life. Baseline:  Goal status: INITIAL  2.  Patient will improve FOTO score to predicted outcomes in order to indicate improved tolerance to activity. Baseline:  Goal status: INITIAL  3.  Patient will be able to navigate stairs with reciprocal pattern without compensation in order to demonstrate improved LE strength. Baseline: states increased L heaviness in heel  Goal status: INITIAL  4.  Patient will demo at least 5 degrees of ankle DF bilaterally for improved gait mechanics.  Baseline: see above Goal status: INITIAL    PLAN:  PT FREQUENCY: 2x/week  PT DURATION: 4 weeks  PLANNED INTERVENTIONS: Therapeutic exercises, Therapeutic activity, Neuromuscular re-education, Balance training, Gait training, Patient/Family education, Joint manipulation, Joint mobilization, Stair training, Orthotic/Fit training, DME instructions, Aquatic Therapy, Dry Needling, Electrical stimulation, Spinal manipulation, Spinal mobilization, Cryotherapy, Moist heat, Compression bandaging, scar mobilization, Splintting, Taping, Traction, Ultrasound, Ionotophoresis 4mg /ml Dexamethasone, and Manual therapy  PLAN FOR NEXT SESSION: complete foto, F/u on HEP, ankle mobility and strength, balance training, functional strength   Reola Mosher Hjalmer Iovino, PT 01/31/2023, 1:43 PM

## 2023-02-03 ENCOUNTER — Ambulatory Visit: Payer: HMO | Admitting: Orthopedic Surgery

## 2023-02-07 ENCOUNTER — Ambulatory Visit (HOSPITAL_COMMUNITY): Payer: HMO | Admitting: Physical Therapy

## 2023-02-15 ENCOUNTER — Ambulatory Visit (HOSPITAL_COMMUNITY): Payer: HMO | Admitting: Physical Therapy

## 2023-02-15 DIAGNOSIS — M25572 Pain in left ankle and joints of left foot: Secondary | ICD-10-CM

## 2023-02-15 DIAGNOSIS — R2689 Other abnormalities of gait and mobility: Secondary | ICD-10-CM

## 2023-02-15 DIAGNOSIS — R29898 Other symptoms and signs involving the musculoskeletal system: Secondary | ICD-10-CM

## 2023-02-15 NOTE — Therapy (Signed)
OUTPATIENT PHYSICAL THERAPY TREATMENT  Patient Name: [name] [name] MRN: [id number] DOB:05-25-1941, 82 y.o., female Today's Date: 02/15/2023  END OF SESSION:  PT End of Session - 02/15/23 1523     Visit Number 2    Number of Visits 8    Date for PT Re-Evaluation 02/28/23    Authorization Type [employer]    PT Start Time 1522    PT Stop Time 1600    PT Time Calculation (min) 38 min    Activity Tolerance Patient tolerated treatment well    Behavior During Therapy [facility] for tasks assessed/performed             Past Medical History:  Diagnosis Date   CAD (coronary artery disease)    Multivessel, PTCA of posterior descending followed by CABG January 2017   Essential hypertension    ST elevation myocardial infarction (STEMI) of inferior wall [facility])    January 2017   Stroke [facility])    April 2020   Wound infection after surgery    Sternal wound infection requiring debridement, pectoralis muscle flap repair, wound [facility] February 2017   Past Surgical History:  Procedure Laterality Date   ABDOMINAL HYSTERECTOMY     APPENDECTOMY     APPLICATION OF WOUND VAC N/A 09/04/2015   Procedure: WOUND VAC CHANGE;  Surgeon: Kerin Perna, MD;  Location: [facility] OR;  Service: Thoracic;  Laterality: N/A;   BACK SURGERY     CARDIAC CATHETERIZATION N/A 08/10/2015   Procedure: Left Heart Cath and Coronary Angiography;  Surgeon: Corky Crafts, MD;  Location: [facility] INVASIVE CV LAB;  Service: Cardiovascular;  Laterality: N/A;   CARDIAC CATHETERIZATION  08/10/2015   Procedure: Coronary Balloon Angioplasty;  Surgeon: Corky Crafts, MD;  Location: [facility] INVASIVE CV LAB;  Service: Cardiovascular;;   CHEST TUBE INSERTION Left 09/08/2015   Procedure: CHEST TUBE INSERTION;  Surgeon: Kerin Perna, MD;  Location: [facility] OR;  Service: Thoracic;  Laterality: Left;   COLONOSCOPY N/A 03/29/2013   Procedure: COLONOSCOPY;  Surgeon: Malissa Hippo, MD;  Location: [facility] ENDO SUITE;  Service: Endoscopy;  Laterality:  N/A;  930   CORONARY ARTERY BYPASS GRAFT N/A 08/14/2015   Procedure: CORONARY ARTERY BYPASS GRAFTING (CABG)X3 LIMA-LAD; SVG-RAMUS; SVG-PD TRANSESOPHAGEAL ECHOCARDIOGRAM (TEE);  Surgeon: Kerin Perna, MD;  Location: [facility] OR;  Service: Open Heart Surgery;  Laterality: N/A;   PECTORALIS FLAP N/A 09/08/2015   Procedure: Muscle FLAP;  Surgeon: Alena Bills Dillingham, DO;  Location: [facility] OR;  Service: Plastics;  Laterality: N/A;   STERNAL WOUND DEBRIDEMENT N/A 09/01/2015   Procedure: STERNAL WOUND DEBRIDEMENT;  Surgeon: Kerin Perna, MD;  Location[facility] OR;  Service: Thoracic;  Laterality: N/A;   STERNAL WOUND DEBRIDEMENT N/A 09/04/2015   Procedure: STERNAL WOUND DEBRIDEMENT;  Surgeon: Kerin Perna, MD;  Location: [facility] OR;  Service: Thoracic;  Laterality: N/A;   STERNAL WOUND DEBRIDEMENT N/A 09/08/2015   Procedure: STERNAL WOUND DEBRIDEMENT;  Surgeon: Kerin Perna, MD;  Location: [facility] OR;  Service: Thoracic;  Laterality: N/A;   TEE WITHOUT CARDIOVERSION N/A 08/14/2015   Procedure: TRANSESOPHAGEAL ECHOCARDIOGRAM (TEE);  Surgeon: Kerin Perna, MD;  Location: [facility] OR;  Service: Open Heart Surgery;  Laterality: N/A;   Patient Active Problem List   Diagnosis Date Noted   Diaphragmatic hernia with obstruction 11/26/2022   Diarrhea 11/26/2022   Other somatoform disorders 11/26/2022   IBS (irritable bowel syndrome) 03/12/2021   Hypertrophic scar 12/25/2019   Acute CVA (cerebrovascular accident) (HCC) 11/15/2018  TIA (transient ischemic attack) 11/15/2018   Wound infection after surgery 09/01/2015   Retrosternal abscess (HCC) 08/31/2015   Sternal wound dehiscence 08/31/2015   S/P CABG x 3 08/14/2015   CAD (coronary artery disease), native coronary artery 08/11/2015   CAD (coronary artery disease)    Acute inferior myocardial infarction (HCC) 08/10/2015   Acute MI, inferior wall, initial episode of care [facility])    Essential hypertension 02/19/2015   Left knee pain 01/08/2014   AV block, 1st degree 11/21/2008    Chest pain 11/21/2008   Esophageal reflux 11/21/2008   Disorder resulting from impaired renal function 11/29/2007   Dyslipidemia 11/29/2007   Impaired fasting glucose 03/06/2007   Menopausal syndrome 03/30/2006   Constipation 09/16/2004   Venous insufficiency 05/21/2003   Allergic rhinitis 11/17/2001   Herpes zoster 06/02/1999   Edema 05/28/1999    PCP: Benita Stabile MD  REFERRING PROVIDER: Vickki Hearing, MD  REFERRING DIAG: [phone] (ICD-10-CM) - Pain in left ankle and joints of left foot, Achillies tendonitis with bone spur  THERAPY DIAG:  Pain in left ankle and joints of left foot  Other abnormalities of gait and mobility  Other symptoms and signs involving the musculoskeletal system  Rationale for Evaluation and Treatment: Rehabilitation  ONSET DATE: 1 year  SUBJECTIVE:   SUBJECTIVE STATEMENT: Pt states she's been doing the 2 exercises given to her at [facility] while she was on her vacation.  States she had a lot of soreness yesterday as she was up on her feet more.  None today.   Patient states her L heel hurts. She has a bone spur and some tendonitis. Some aching and heaviness in L calf. Symptoms began with insidious onset. Symptoms have improved with Celebrex. Patient states heel is tender. Symptoms in calf mostly come every couple of months. Tried heel supports in shoe but did didn't help.   PERTINENT HISTORY: CAD, HTN, Hx MI, Hx, CVA, Large calcaneal spur near the Achilles tendon insertion PAIN:  Are you having pain? Yes: NPRS scale: 2/10 Pain location: L heel Pain description: burning Aggravating factors: walking Relieving factors: sitting  PRECAUTIONS: None  WEIGHT BEARING RESTRICTIONS: No  FALLS:  Has patient fallen in last 6 months? No  OCCUPATION: retired   PLOF: Independent  PATIENT GOALS: more movement in L leg   OBJECTIVE:   DIAGNOSTIC FINDINGS: Imaging shows normal ankle joint normal ankle mortise osteopenia tibia and fibula large  calcaneal spur on the posterior aspect of the calcaneus  Large calcaneal spur near the Achilles tendon insertion normal ankle joint  PATIENT SURVEYS: FOTO 68.7%  COGNITION: Overall cognitive status: Within functional limits for tasks assessed     SENSATION: WFL  EDEMA: edema in feet bilaterally    POSTURE: No Significant postural limitations  PALPATION: No TTP throughout bilateral LE  LOWER EXTREMITY ROM:  Active ROM Right eval Left eval  Hip flexion    Hip extension    Hip abduction    Hip adduction    Hip internal rotation    Hip external rotation    Knee flexion    Knee extension    Ankle dorsiflexion 1 2  Ankle plantarflexion 48 45  Ankle inversion 30 33  Ankle eversion 15 10   (Blank rows = not tested)  LOWER EXTREMITY MMT:  MMT Right eval Left eval  Hip flexion    Hip extension    Hip abduction    Hip adduction    Hip internal rotation    Hip external  rotation    Knee flexion 5 5  Knee extension 5 5  Ankle dorsiflexion 5 5  Ankle plantarflexion    Ankle inversion 5 5  Ankle eversion 5 5   (Blank rows = not tested) Single leg heel raise: able to complete bilaterally without symptoms   FUNCTIONAL TESTS:  SLS: R 10 seconds, L: <3 seconds Stairs: 7 inch, able to complete with alternating pattern with good mechanics and unilateral UE support  GAIT: Distance walked: 226 feet Assistive device utilized: None Level of assistance: Complete Independence Comments: decreased L ankle DF   TODAY'S TREATMENT:                                                                                                                              DATE:  02/15/23 Standing:  heelraises 10X  Toeraises 10x  Tandem stance 30" each LE X2  Gastroc slant board stretch 3X30"  PF stretch with 4" step 3X30" FOTO completion 68.7% Goal review  01/31/23 Heel raise 1 x 10  Toe raise 1 x 10     PATIENT EDUCATION:  Education details: Patient educated on exam findings, POC,  scope of PT, [facility] Person educated: Patient Education method: Programmer, multimedia, Demonstration, and Handouts Education comprehension: verbalized understanding, returned demonstration, verbal cues required, and tactile cues required  HOME EXERCISE PROGRAM: Access Code: [email or web address] URL: [email or web address]  Date: 01/31/2023 - Heel Raises with Counter Support  - 3 x daily - 7 x weekly - 2 sets - 10 reps - Toe Raises with Counter Support  - 3 x daily - 7 x weekly - 2 sets - 10 reps  Date: 02/15/2023 Prepared by: Emeline Gins Exercises - Plantar Fascia Stretch on Step  - 2 x daily - 7 x weekly - 2 sets - 3 reps - 30 sec hold - Tandem Stance  - 2 x daily - 7 x weekly - 1 sets - 5 reps - 30 sec hold  ASSESSMENT:  CLINICAL IMPRESSION: Completed FOTO and reveiwed goals/[facility].   Added gastroc stretch using step with noted tightness and restriction in mm.  Pt able to complete tandem stance with some instability but maintained for 30 seconds with either LE leading.  Pt able to complete all exercises today without c/o pain.  Updated [facility] to include stretch and balance.  Patient will benefit from skilled physical therapy in order to improve function and reduce impairment.  OBJECTIVE IMPAIRMENTS: Abnormal gait, decreased activity tolerance, decreased balance, decreased endurance, decreased mobility, difficulty walking, decreased strength, impaired flexibility, improper body mechanics, and pain.   ACTIVITY LIMITATIONS: bending, standing, squatting, stairs, transfers, locomotion level, and caring for others  PARTICIPATION LIMITATIONS: meal prep, cleaning, laundry, shopping, community activity, and yard work  PERSONAL FACTORS: Age, Time since onset of injury/illness/exacerbation, and 3+ comorbidities: CAD, HTN, Hx MI, Hx, CVA, Large calcaneal spur near the Achilles tendon insertion  are also affecting patient's functional outcome.   REHAB POTENTIAL: Good  CLINICAL DECISION MAKING:  Stable/uncomplicated  EVALUATION COMPLEXITY: Low   GOALS: Goals reviewed with patient? Yes  SHORT TERM GOALS: Target date: 02/14/2023    Patient will be independent with [facility] in order to improve functional outcomes. Baseline: Goal status: IN PROGRESS  2.  Patient will report at least 25% improvement in symptoms for improved quality of life. Baseline: Goal status: IN PROGRESS   LONG TERM GOALS: Target date: 02/28/2023    Patient will report at least 75% improvement in symptoms for improved quality of life. Baseline:  Goal status: IN PROGRESS  2.  Patient will improve FOTO score to predicted outcomes in order to indicate improved tolerance to activity. Baseline:  Goal status: IN PROGRESS  3.  Patient will be able to navigate stairs with reciprocal pattern without compensation in order to demonstrate improved LE strength. Baseline: states increased L heaviness in heel  Goal status: IN PROGRESS  4.  Patient will demo at least 5 degrees of ankle DF bilaterally for improved gait mechanics.  Baseline: see above Goal status: IN PROGRESS    PLAN:  PT FREQUENCY: 2x/week  PT DURATION: 4 weeks  PLANNED INTERVENTIONS: Therapeutic exercises, Therapeutic activity, Neuromuscular re-education, Balance training, Gait training, Patient/Family education, Joint manipulation, Joint mobilization, Stair training, Orthotic/Fit training, DME instructions, Aquatic Therapy, Dry Needling, Electrical stimulation, Spinal manipulation, Spinal mobilization, Cryotherapy, Moist heat, Compression bandaging, scar mobilization, Splintting, Taping, Traction, Ultrasound, Ionotophoresis 4mg /ml Dexamethasone, and Manual therapy  PLAN FOR NEXT SESSION: continue to progress ankle mobility and strength, balance training, functional strength   Lurena Nida, PTA 02/15/2023, 3:24 PM

## 2023-02-17 ENCOUNTER — Ambulatory Visit (HOSPITAL_COMMUNITY): Payer: HMO | Admitting: Physical Therapy

## 2023-02-17 DIAGNOSIS — M25572 Pain in left ankle and joints of left foot: Secondary | ICD-10-CM

## 2023-02-17 DIAGNOSIS — R2689 Other abnormalities of gait and mobility: Secondary | ICD-10-CM

## 2023-02-17 NOTE — Therapy (Signed)
OUTPATIENT PHYSICAL THERAPY TREATMENT  Patient Name: [name] [name] MRN: [id number] DOB:25-Apr-1941, 82 y.o., female Today's Date: 02/17/2023  END OF SESSION:  PT End of Session - 02/17/23 1306     Visit Number 3    Number of Visits 8    Date for PT Re-Evaluation 02/28/23    Authorization Type [employer]    PT Start Time 1304    PT Stop Time 1345    PT Time Calculation (min) 41 min    Activity Tolerance Patient tolerated treatment well    Behavior During Therapy [facility] for tasks assessed/performed             Past Medical History:  Diagnosis Date   CAD (coronary artery disease)    Multivessel, PTCA of posterior descending followed by CABG January 2017   Essential hypertension    ST elevation myocardial infarction (STEMI) of inferior wall [facility])    January 2017   Stroke [facility])    April 2020   Wound infection after surgery    Sternal wound infection requiring debridement, pectoralis muscle flap repair, wound [facility] February 2017   Past Surgical History:  Procedure Laterality Date   ABDOMINAL HYSTERECTOMY     APPENDECTOMY     APPLICATION OF WOUND VAC N/A 09/04/2015   Procedure: WOUND VAC CHANGE;  Surgeon: Kerin Perna, MD;  Location: [facility] OR;  Service: Thoracic;  Laterality: N/A;   BACK SURGERY     CARDIAC CATHETERIZATION N/A 08/10/2015   Procedure: Left Heart Cath and Coronary Angiography;  Surgeon: Corky Crafts, MD;  Location: [facility] INVASIVE CV LAB;  Service: Cardiovascular;  Laterality: N/A;   CARDIAC CATHETERIZATION  08/10/2015   Procedure: Coronary Balloon Angioplasty;  Surgeon: Corky Crafts, MD;  Location: [facility] INVASIVE CV LAB;  Service: Cardiovascular;;   CHEST TUBE INSERTION Left 09/08/2015   Procedure: CHEST TUBE INSERTION;  Surgeon: Kerin Perna, MD;  Location[facility] OR;  Service: Thoracic;  Laterality: Left;   COLONOSCOPY N/A 03/29/2013   Procedure: COLONOSCOPY;  Surgeon: Malissa Hippo, MD;  Location: [facility] ENDO SUITE;  Service: Endoscopy;  Laterality:  N/A;  930   CORONARY ARTERY BYPASS GRAFT N/A 08/14/2015   Procedure: CORONARY ARTERY BYPASS GRAFTING (CABG)X3 LIMA-LAD; SVG-RAMUS; SVG-PD TRANSESOPHAGEAL ECHOCARDIOGRAM (TEE);  Surgeon: Kerin Perna, MD;  Location: [facility] OR;  Service: Open Heart Surgery;  Laterality: N/A;   PECTORALIS FLAP N/A 09/08/2015   Procedure: Muscle FLAP;  Surgeon: Alena Bills Dillingham, DO;  Location: [facility] OR;  Service: Plastics;  Laterality: N/A;   STERNAL WOUND DEBRIDEMENT N/A 09/01/2015   Procedure: STERNAL WOUND DEBRIDEMENT;  Surgeon: Kerin Perna, MD;  Location: [facility] OR;  Service: Thoracic;  Laterality: N/A;   STERNAL WOUND DEBRIDEMENT N/A 09/04/2015   Procedure: STERNAL WOUND DEBRIDEMENT;  Surgeon: Kerin Perna, MD;  Location: [facility] OR;  Service: Thoracic;  Laterality: N/A;   STERNAL WOUND DEBRIDEMENT N/A 09/08/2015   Procedure: STERNAL WOUND DEBRIDEMENT;  Surgeon: Kerin Perna, MD;  Location: [facility] OR;  Service: Thoracic;  Laterality: N/A;   TEE WITHOUT CARDIOVERSION N/A 08/14/2015   Procedure: TRANSESOPHAGEAL ECHOCARDIOGRAM (TEE);  Surgeon: Kerin Perna, MD;  Location: [facility] OR;  Service: Open Heart Surgery;  Laterality: N/A;   Patient Active Problem List   Diagnosis Date Noted   Diaphragmatic hernia with obstruction 11/26/2022   Diarrhea 11/26/2022   Other somatoform disorders 11/26/2022   IBS (irritable bowel syndrome) 03/12/2021   Hypertrophic scar 12/25/2019   Acute CVA (cerebrovascular accident) (HCC) 11/15/2018  TIA (transient ischemic attack) 11/15/2018   Wound infection after surgery 09/01/2015   Retrosternal abscess (HCC) 08/31/2015   Sternal wound dehiscence 08/31/2015   S/P CABG x 3 08/14/2015   CAD (coronary artery disease), native coronary artery 08/11/2015   CAD (coronary artery disease)    Acute inferior myocardial infarction (HCC) 08/10/2015   Acute MI, inferior wall, initial episode of care [facility])    Essential hypertension 02/19/2015   Left knee pain 01/08/2014   AV block, 1st degree 11/21/2008    Chest pain 11/21/2008   Esophageal reflux 11/21/2008   Disorder resulting from impaired renal function 11/29/2007   Dyslipidemia 11/29/2007   Impaired fasting glucose 03/06/2007   Menopausal syndrome 03/30/2006   Constipation 09/16/2004   Venous insufficiency 05/21/2003   Allergic rhinitis 11/17/2001   Herpes zoster 06/02/1999   Edema 05/28/1999    PCP: Benita Stabile MD  REFERRING PROVIDER: Vickki Hearing, MD  REFERRING DIAG: [phone] (ICD-10-CM) - Pain in left ankle and joints of left foot, Achillies tendonitis with bone spur  THERAPY DIAG:  Pain in left ankle and joints of left foot  Other abnormalities of gait and mobility  Rationale for Evaluation and Treatment: Rehabilitation  ONSET DATE: 1 year  SUBJECTIVE:   SUBJECTIVE STATEMENT: Pt states she's doing the stretches given last time and working on her balance.  Feels she's improving.   Patient states her L heel hurts. She has a bone spur and some tendonitis. Some aching and heaviness in L calf. Symptoms began with insidious onset. Symptoms have improved with Celebrex. Patient states heel is tender. Symptoms in calf mostly come every couple of months. Tried heel supports in shoe but did didn't help.   PERTINENT HISTORY: CAD, HTN, Hx MI, Hx, CVA, Large calcaneal spur near the Achilles tendon insertion PAIN:  Are you having pain? Yes: NPRS scale: 2/10 Pain location: L heel Pain description: burning Aggravating factors: walking Relieving factors: sitting  PRECAUTIONS: None  WEIGHT BEARING RESTRICTIONS: No  FALLS:  Has patient fallen in last 6 months? No  OCCUPATION: retired   PLOF: Independent  PATIENT GOALS: more movement in L leg   OBJECTIVE:   DIAGNOSTIC FINDINGS: Imaging shows normal ankle joint normal ankle mortise osteopenia tibia and fibula large calcaneal spur on the posterior aspect of the calcaneus  Large calcaneal spur near the Achilles tendon insertion normal ankle joint  PATIENT  SURVEYS: FOTO 68.7%  COGNITION: Overall cognitive status: Within functional limits for tasks assessed     SENSATION: WFL  EDEMA: edema in feet bilaterally    POSTURE: No Significant postural limitations  PALPATION: No TTP throughout bilateral LE  LOWER EXTREMITY ROM:  Active ROM Right eval Left eval  Hip flexion    Hip extension    Hip abduction    Hip adduction    Hip internal rotation    Hip external rotation    Knee flexion    Knee extension    Ankle dorsiflexion 1 2  Ankle plantarflexion 48 45  Ankle inversion 30 33  Ankle eversion 15 10   (Blank rows = not tested)  LOWER EXTREMITY MMT:  MMT Right eval Left eval  Hip flexion    Hip extension    Hip abduction    Hip adduction    Hip internal rotation    Hip external rotation    Knee flexion 5 5  Knee extension 5 5  Ankle dorsiflexion 5 5  Ankle plantarflexion    Ankle inversion 5 5  Ankle  eversion 5 5   (Blank rows = not tested) Single leg heel raise: able to complete bilaterally without symptoms   FUNCTIONAL TESTS:  SLS: R 10 seconds, L: <3 seconds Stairs: 7 inch, able to complete with alternating pattern with good mechanics and unilateral UE support  GAIT: Distance walked: 226 feet Assistive device utilized: None Level of assistance: Complete Independence Comments: decreased L ankle DF   TODAY'S TREATMENT:                                                                                                                              DATE:  02/17/23 Standing:  heelraises 10X  Toeraises 10x  Tandem stance 1 minute each LE lead on balance beam X2  Gastroc slant board stretch 3X30"  PF stretch with 4" step 3X30"  Vectors 5X5" each with 1 HHA  Lunge onto 4" step 10X each no UE  Forward step ups 4" 10X Lt only  Lateral step ups 4" 10X Lt only heel taps Sit to stands 5X no UE  Dorsiflexion 10X5" holds as high as possible Windshield wipers with washcloth 10X2  02/15/23 Standing:  heelraises  10X  Toeraises 10x  Tandem stance 30" each LE X2  Gastroc slant board stretch 3X30"  PF stretch with 4" step 3X30" FOTO completion 68.7% Goal review  01/31/23 Heel raise 1 x 10  Toe raise 1 x 10     PATIENT EDUCATION:  Education details: Patient educated on exam findings, POC, scope of PT, [facility] Person educated: Patient Education method: Programmer, multimedia, Demonstration, and Handouts Education comprehension: verbalized understanding, returned demonstration, verbal cues required, and tactile cues required  HOME EXERCISE PROGRAM: Access Code: [email or web address] URL: [email or web address]  Date: 01/31/2023 - Heel Raises with Counter Support  - 3 x daily - 7 x weekly - 2 sets - 10 reps - Toe Raises with Counter Support  - 3 x daily - 7 x weekly - 2 sets - 10 reps  Date: 02/15/2023 Prepared by: Emeline Gins Exercises - Plantar Fascia Stretch on Step  - 2 x daily - 7 x weekly - 2 sets - 3 reps - 30 sec hold - Tandem Stance  - 2 x daily - 7 x weekly - 1 sets - 5 reps - 30 sec hold  ASSESSMENT:  CLINICAL IMPRESSION: Continued with gastroc stretch using both slant board and step with restriction in mm.  Increased difficulty of tandem stance with addition of foam surface.  Additional exercises added to increased stability and strength of LE"s.  Overall completed well with minimal cues.  Pt did rely on UE for most exercises and had difficulty standing from chair without UE's.  Educated on wearing tennis shoes vs slide in crocs she usually wears for safety.  Pt verbalized understanding.  Patient will continue to benefit from skilled physical therapy in order to improve function and reduce impairment.  OBJECTIVE IMPAIRMENTS: Abnormal gait, decreased activity tolerance, decreased balance, decreased endurance, decreased mobility, difficulty  walking, decreased strength, impaired flexibility, improper body mechanics, and pain.   ACTIVITY LIMITATIONS: bending, standing, squatting, stairs, transfers,  locomotion level, and caring for others  PARTICIPATION LIMITATIONS: meal prep, cleaning, laundry, shopping, community activity, and yard work  PERSONAL FACTORS: Age, Time since onset of injury/illness/exacerbation, and 3+ comorbidities: CAD, HTN, Hx MI, Hx, CVA, Large calcaneal spur near the Achilles tendon insertion  are also affecting patient's functional outcome.   REHAB POTENTIAL: Good  CLINICAL DECISION MAKING: Stable/uncomplicated  EVALUATION COMPLEXITY: Low   GOALS: Goals reviewed with patient? Yes  SHORT TERM GOALS: Target date: 02/14/2023    Patient will be independent with [facility] in order to improve functional outcomes. Baseline: Goal status: IN PROGRESS  2.  Patient will report at least 25% improvement in symptoms for improved quality of life. Baseline: Goal status: IN PROGRESS   LONG TERM GOALS: Target date: 02/28/2023    Patient will report at least 75% improvement in symptoms for improved quality of life. Baseline:  Goal status: IN PROGRESS  2.  Patient will improve FOTO score to predicted outcomes in order to indicate improved tolerance to activity. Baseline:  Goal status: IN PROGRESS  3.  Patient will be able to navigate stairs with reciprocal pattern without compensation in order to demonstrate improved LE strength. Baseline: states increased L heaviness in heel  Goal status: IN PROGRESS  4.  Patient will demo at least 5 degrees of ankle DF bilaterally for improved gait mechanics.  Baseline: see above Goal status: IN PROGRESS    PLAN:  PT FREQUENCY: 2x/week  PT DURATION: 4 weeks  PLANNED INTERVENTIONS: Therapeutic exercises, Therapeutic activity, Neuromuscular re-education, Balance training, Gait training, Patient/Family education, Joint manipulation, Joint mobilization, Stair training, Orthotic/Fit training, DME instructions, Aquatic Therapy, Dry Needling, Electrical stimulation, Spinal manipulation, Spinal mobilization, Cryotherapy, Moist heat,  Compression bandaging, scar mobilization, Splintting, Taping, Traction, Ultrasound, Ionotophoresis 4mg /ml Dexamethasone, and Manual therapy  PLAN FOR NEXT SESSION: continue to progress ankle mobility and strength, balance training, functional strength   Emeline Gins B, PTA 02/17/2023, 1:45 PM

## 2023-02-22 ENCOUNTER — Ambulatory Visit (HOSPITAL_COMMUNITY): Payer: HMO | Admitting: Physical Therapy

## 2023-02-22 ENCOUNTER — Telehealth: Payer: Self-pay | Admitting: Cardiology

## 2023-02-22 DIAGNOSIS — M25572 Pain in left ankle and joints of left foot: Secondary | ICD-10-CM

## 2023-02-22 DIAGNOSIS — R29898 Other symptoms and signs involving the musculoskeletal system: Secondary | ICD-10-CM

## 2023-02-22 DIAGNOSIS — R2689 Other abnormalities of gait and mobility: Secondary | ICD-10-CM

## 2023-02-22 NOTE — Therapy (Signed)
OUTPATIENT PHYSICAL THERAPY TREATMENT  Patient Name: [name] [name] MRN: [id number] DOB:Mar 02, 1941, 82 y.o., female Today's Date: 02/22/2023  END OF SESSION:  PT End of Session - 02/22/23 1252     Visit Number 4    Number of Visits 8    Date for PT Re-Evaluation 02/28/23    Authorization Type [employer]    PT Start Time 1300    PT Stop Time 1340    PT Time Calculation (min) 40 min    Activity Tolerance Patient tolerated treatment well    Behavior During Therapy [facility] for tasks assessed/performed             Past Medical History:  Diagnosis Date   CAD (coronary artery disease)    Multivessel, PTCA of posterior descending followed by CABG January 2017   Essential hypertension    ST elevation myocardial infarction (STEMI) of inferior wall [facility])    January 2017   Stroke [facility])    April 2020   Wound infection after surgery    Sternal wound infection requiring debridement, pectoralis muscle flap repair, wound [facility] February 2017   Past Surgical History:  Procedure Laterality Date   ABDOMINAL HYSTERECTOMY     APPENDECTOMY     APPLICATION OF WOUND VAC N/A 09/04/2015   Procedure: WOUND VAC CHANGE;  Surgeon: Kerin Perna, MD;  Location: [facility] OR;  Service: Thoracic;  Laterality: N/A;   BACK SURGERY     CARDIAC CATHETERIZATION N/A 08/10/2015   Procedure: Left Heart Cath and Coronary Angiography;  Surgeon: Corky Crafts, MD;  Location: [facility] INVASIVE CV LAB;  Service: Cardiovascular;  Laterality: N/A;   CARDIAC CATHETERIZATION  08/10/2015   Procedure: Coronary Balloon Angioplasty;  Surgeon: Corky Crafts, MD;  Location: [facility] INVASIVE CV LAB;  Service: Cardiovascular;;   CHEST TUBE INSERTION Left 09/08/2015   Procedure: CHEST TUBE INSERTION;  Surgeon: Kerin Perna, MD;  Location: [facility] OR;  Service: Thoracic;  Laterality: Left;   COLONOSCOPY N/A 03/29/2013   Procedure: COLONOSCOPY;  Surgeon: Malissa Hippo, MD;  Location: [facility] ENDO SUITE;  Service: Endoscopy;  Laterality:  N/A;  930   CORONARY ARTERY BYPASS GRAFT N/A 08/14/2015   Procedure: CORONARY ARTERY BYPASS GRAFTING (CABG)X3 LIMA-LAD; SVG-RAMUS; SVG-PD TRANSESOPHAGEAL ECHOCARDIOGRAM (TEE);  Surgeon: Kerin Perna, MD;  Location: [facility] OR;  Service: Open Heart Surgery;  Laterality: N/A;   PECTORALIS FLAP N/A 09/08/2015   Procedure: Muscle FLAP;  Surgeon: Alena Bills Dillingham, DO;  Location: [facility] OR;  Service: Plastics;  Laterality: N/A;   STERNAL WOUND DEBRIDEMENT N/A 09/01/2015   Procedure: STERNAL WOUND DEBRIDEMENT;  Surgeon: Kerin Perna, MD;  Location: [facility] OR;  Service: Thoracic;  Laterality: N/A;   STERNAL WOUND DEBRIDEMENT N/A 09/04/2015   Procedure: STERNAL WOUND DEBRIDEMENT;  Surgeon: Kerin Perna, MD;  Location: [facility] OR;  Service: Thoracic;  Laterality: N/A;   STERNAL WOUND DEBRIDEMENT N/A 09/08/2015   Procedure: STERNAL WOUND DEBRIDEMENT;  Surgeon: Kerin Perna, MD;  Location: [facility] OR;  Service: Thoracic;  Laterality: N/A;   TEE WITHOUT CARDIOVERSION N/A 08/14/2015   Procedure: TRANSESOPHAGEAL ECHOCARDIOGRAM (TEE);  Surgeon: Kerin Perna, MD;  Location: [facility] OR;  Service: Open Heart Surgery;  Laterality: N/A;   Patient Active Problem List   Diagnosis Date Noted   Diaphragmatic hernia with obstruction 11/26/2022   Diarrhea 11/26/2022   Other somatoform disorders 11/26/2022   IBS (irritable bowel syndrome) 03/12/2021   Hypertrophic scar 12/25/2019   Acute CVA (cerebrovascular accident) (HCC) 11/15/2018  TIA (transient ischemic attack) 11/15/2018   Wound infection after surgery 09/01/2015   Retrosternal abscess (HCC) 08/31/2015   Sternal wound dehiscence 08/31/2015   S/P CABG x 3 08/14/2015   CAD (coronary artery disease), native coronary artery 08/11/2015   CAD (coronary artery disease)    Acute inferior myocardial infarction (HCC) 08/10/2015   Acute MI, inferior wall, initial episode of care [facility])    Essential hypertension 02/19/2015   Left knee pain 01/08/2014   AV block, 1st degree 11/21/2008    Chest pain 11/21/2008   Esophageal reflux 11/21/2008   Disorder resulting from impaired renal function 11/29/2007   Dyslipidemia 11/29/2007   Impaired fasting glucose 03/06/2007   Menopausal syndrome 03/30/2006   Constipation 09/16/2004   Venous insufficiency 05/21/2003   Allergic rhinitis 11/17/2001   Herpes zoster 06/02/1999   Edema 05/28/1999    PCP: Benita Stabile MD  REFERRING PROVIDER: Vickki Hearing, MD  REFERRING DIAG: [phone] (ICD-10-CM) - Pain in left ankle and joints of left foot, Achillies tendonitis with bone spur  THERAPY DIAG:  Pain in left ankle and joints of left foot  Other abnormalities of gait and mobility  Other symptoms and signs involving the musculoskeletal system  Rationale for Evaluation and Treatment: Rehabilitation  ONSET DATE: 1 year  SUBJECTIVE:   SUBJECTIVE STATEMENT: Pt states she's doing better.  No pain.   Patient states her L heel hurts. She has a bone spur and some tendonitis. Some aching and heaviness in L calf. Symptoms began with insidious onset. Symptoms have improved with Celebrex. Patient states heel is tender. Symptoms in calf mostly come every couple of months. Tried heel supports in shoe but did didn't help.   PERTINENT HISTORY: CAD, HTN, Hx MI, Hx, CVA, Large calcaneal spur near the Achilles tendon insertion PAIN:  Are you having pain? Yes: NPRS scale: 2/10 Pain location: L heel Pain description: burning Aggravating factors: walking Relieving factors: sitting  PRECAUTIONS: None  WEIGHT BEARING RESTRICTIONS: No  FALLS:  Has patient fallen in last 6 months? No  OCCUPATION: retired   PLOF: Independent  PATIENT GOALS: more movement in L leg   OBJECTIVE:   DIAGNOSTIC FINDINGS: Imaging shows normal ankle joint normal ankle mortise osteopenia tibia and fibula large calcaneal spur on the posterior aspect of the calcaneus  Large calcaneal spur near the Achilles tendon insertion normal ankle joint  PATIENT  SURVEYS: FOTO 68.7%  COGNITION: Overall cognitive status: Within functional limits for tasks assessed     SENSATION: WFL  EDEMA: edema in feet bilaterally    POSTURE: No Significant postural limitations  PALPATION: No TTP throughout bilateral LE  LOWER EXTREMITY ROM:  Active ROM Right eval Left eval  Hip flexion    Hip extension    Hip abduction    Hip adduction    Hip internal rotation    Hip external rotation    Knee flexion    Knee extension    Ankle dorsiflexion 1 2  Ankle plantarflexion 48 45  Ankle inversion 30 33  Ankle eversion 15 10   (Blank rows = not tested)  LOWER EXTREMITY MMT:  MMT Right eval Left eval  Hip flexion    Hip extension    Hip abduction    Hip adduction    Hip internal rotation    Hip external rotation    Knee flexion 5 5  Knee extension 5 5  Ankle dorsiflexion 5 5  Ankle plantarflexion    Ankle inversion 5 5  Ankle eversion  5 5   (Blank rows = not tested) Single leg heel raise: able to complete bilaterally without symptoms   FUNCTIONAL TESTS:  SLS: R 10 seconds, L: <3 seconds Stairs: 7 inch, able to complete with alternating pattern with good mechanics and unilateral UE support  GAIT: Distance walked: 226 feet Assistive device utilized: None Level of assistance: Complete Independence Comments: decreased L ankle DF   TODAY'S TREATMENT:                                                                                                                              DATE:  02/22/23 Standing:  heelraises on incline 20X  Toeraises on incline 20x  Tandem stance 1 minute each LE lead on balance beam X2  Gastroc slant board stretch 3X30"  PF stretch with 4" step 3X30" Lt only  Vectors 10X5" each with 1 HHA  Lunge onto 4" step 10X2 each no UE  Forward step ups 4" 10X2 Lt only  Lateral step ups 4" 10X2 Lt only heel taps  Forward step downs Lt on step 10X eccentric control Sit to stands 10X no UE   02/17/23 Standing:   heelraises 10X  Toeraises 10x  Tandem stance 1 minute each LE lead on balance beam X2  Gastroc slant board stretch 3X30"  PF stretch with 4" step 3X30"  Vectors 5X5" each with 1 HHA  Lunge onto 4" step 10X each no UE  Forward step ups 4" 10X Lt only  Lateral step ups 4" 10X Lt only heel taps Sit to stands 5X no UE  Dorsiflexion 10X5" holds as high as possible Windshield wipers with washcloth 10X2  02/15/23 Standing:  heelraises 10X  Toeraises 10x  Tandem stance 30" each LE X2  Gastroc slant board stretch 3X30"  PF stretch with 4" step 3X30" FOTO completion 68.7% Goal review  01/31/23 Heel raise 1 x 10  Toe raise 1 x 10     PATIENT EDUCATION:  Education details: Patient educated on exam findings, POC, scope of PT, HEP Person educated: Patient Education method: Programmer, multimedia, Demonstration, and Handouts Education comprehension: verbalized understanding, returned demonstration, verbal cues required, and tactile cues required  HOME EXERCISE PROGRAM: Access Code: [email or web address] URL: [email or web address]  Date: 01/31/2023 - Heel Raises with Counter Support  - 3 x daily - 7 x weekly - 2 sets - 10 reps - Toe Raises with Counter Support  - 3 x daily - 7 x weekly - 2 sets - 10 reps  Date: 02/15/2023 Prepared by: Emeline Gins Exercises - Plantar Fascia Stretch on Step  - 2 x daily - 7 x weekly - 2 sets - 3 reps - 30 sec hold - Tandem Stance  - 2 x daily - 7 x weekly - 1 sets - 5 reps - 30 sec hold  ASSESSMENT:  CLINICAL IMPRESSION: Pt wearing tennis shoes today.  Continued with stretching and stabilization activities to improve Lt ankle pain and stability.  Noted improvement  with increased AROM.  Additional set added to most all exercises without difficulty.  Tandem stance remains challenging using aeromat surface.  Noted improvement in strength and no complaints of pain during or at completion of activity today.   Patient will continue to benefit from skilled physical  therapy in order to improve function and reduce impairment.  OBJECTIVE IMPAIRMENTS: Abnormal gait, decreased activity tolerance, decreased balance, decreased endurance, decreased mobility, difficulty walking, decreased strength, impaired flexibility, improper body mechanics, and pain.   ACTIVITY LIMITATIONS: bending, standing, squatting, stairs, transfers, locomotion level, and caring for others  PARTICIPATION LIMITATIONS: meal prep, cleaning, laundry, shopping, community activity, and yard work  PERSONAL FACTORS: Age, Time since onset of injury/illness/exacerbation, and 3+ comorbidities: CAD, HTN, Hx MI, Hx, CVA, Large calcaneal spur near the Achilles tendon insertion  are also affecting patient's functional outcome.   REHAB POTENTIAL: Good  CLINICAL DECISION MAKING: Stable/uncomplicated  EVALUATION COMPLEXITY: Low   GOALS: Goals reviewed with patient? Yes  SHORT TERM GOALS: Target date: 02/14/2023    Patient will be independent with HEP in order to improve functional outcomes. Baseline: Goal status: IN PROGRESS  2.  Patient will report at least 25% improvement in symptoms for improved quality of life. Baseline: Goal status: IN PROGRESS   LONG TERM GOALS: Target date: 02/28/2023    Patient will report at least 75% improvement in symptoms for improved quality of life. Baseline:  Goal status: IN PROGRESS  2.  Patient will improve FOTO score to predicted outcomes in order to indicate improved tolerance to activity. Baseline:  Goal status: IN PROGRESS  3.  Patient will be able to navigate stairs with reciprocal pattern without compensation in order to demonstrate improved LE strength. Baseline: states increased L heaviness in heel  Goal status: IN PROGRESS  4.  Patient will demo at least 5 degrees of ankle DF bilaterally for improved gait mechanics.  Baseline: see above Goal status: IN PROGRESS    PLAN:  PT FREQUENCY: 2x/week  PT DURATION: 4 weeks  PLANNED  INTERVENTIONS: Therapeutic exercises, Therapeutic activity, Neuromuscular re-education, Balance training, Gait training, Patient/Family education, Joint manipulation, Joint mobilization, Stair training, Orthotic/Fit training, DME instructions, Aquatic Therapy, Dry Needling, Electrical stimulation, Spinal manipulation, Spinal mobilization, Cryotherapy, Moist heat, Compression bandaging, scar mobilization, Splintting, Taping, Traction, Ultrasound, Ionotophoresis 4mg /ml Dexamethasone, and Manual therapy  PLAN FOR NEXT SESSION: continue to progress ankle mobility and strength, balance training, functional strength  Lurena Nida, PTA/CLT [facility] Ph: [phone]   Lurena Nida, PTA 02/22/2023, 12:55 PM

## 2023-02-22 NOTE — Telephone Encounter (Signed)
[name] [name] reports she has recently started PT for tendonitis in her left leg. She describes leaning over a bar and pulling with hands as she pushes leg back.She describes soreness in pectoralis muscles after sessions. She describes as a sore and tender so she took tylenol and felt better. She states after her CABG 7 years ago she had to have a plastic closure of her chest wall as her pectoralis muscle was "moved" and the cardiothoracic surgeon was unable to close her chest wall properly. She reports the area look like a keloid and is always tender.  I have asked her to discuss with PT today to perhaps find other options with her stretching.She will update [place] in the future.

## 2023-02-22 NOTE — Telephone Encounter (Signed)
    Pt c/o of Chest Pain: STAT if active CP, including tightness, pressure, jaw pain, radiating pain to shoulder/upper arm/back, CP unrelieved by Nitro. Symptoms reported of SOB, nausea, vomiting, sweating.  1. Are you having CP right now? No     2. Are you experiencing any other symptoms (ex. SOB, nausea, vomiting, sweating)? No    3. Is your CP continuous or coming and going? CP when moving    4. Have you taken Nitroglycerin? No    5. How long have you been experiencing CP?  Last week   6. If NO CP at time of call then end call with telling Pt to call back or call 911 if Chest pain returns prior to return call from triage team.   The patient mentioned that she has been experiencing pain in her left shoulder, which then radiates to her left breast. She noted that she has been exercising for tendinitis and was lifting some bars. She is unsure if the pain is related to this activity or to her heart, as she has previously undergone open-heart surgery.

## 2023-02-24 ENCOUNTER — Encounter (HOSPITAL_COMMUNITY): Payer: Self-pay | Admitting: Physical Therapy

## 2023-02-24 ENCOUNTER — Ambulatory Visit (HOSPITAL_COMMUNITY): Payer: HMO | Attending: Orthopedic Surgery | Admitting: Physical Therapy

## 2023-02-24 DIAGNOSIS — M25572 Pain in left ankle and joints of left foot: Secondary | ICD-10-CM | POA: Insufficient documentation

## 2023-02-24 DIAGNOSIS — R29898 Other symptoms and signs involving the musculoskeletal system: Secondary | ICD-10-CM | POA: Insufficient documentation

## 2023-02-24 DIAGNOSIS — R2689 Other abnormalities of gait and mobility: Secondary | ICD-10-CM | POA: Insufficient documentation

## 2023-02-24 NOTE — Therapy (Signed)
OUTPATIENT PHYSICAL THERAPY TREATMENT  Patient Name: [name] [name] MRN: [id number] DOB:[birth year], 82 y.o., female Today's Date: 02/24/2023  PHYSICAL THERAPY DISCHARGE SUMMARY  Visits from Start of Care: 5  Current functional level related to goals / functional outcomes: See below   Remaining deficits:  See below   Education / Equipment: See below   Patient agrees to discharge. Patient goals were met. Patient is being discharged due to being pleased with the current functional level.   END OF SESSION:  PT End of Session - 02/24/23 1351     Visit Number 5    Number of Visits 8    Date for PT Re-Evaluation 02/28/23    Authorization Type [employer]    PT Start Time 1351    PT Stop Time 1415    PT Time Calculation (min) 24 min    Activity Tolerance Patient tolerated treatment well    Behavior During Therapy [facility] for tasks assessed/performed             Past Medical History:  Diagnosis Date   CAD (coronary artery disease)    Multivessel, PTCA of posterior descending followed by CABG January 2017   Essential hypertension    ST elevation myocardial infarction (STEMI) of inferior wall [facility])    January 2017   Stroke [facility])    April 2020   Wound infection after surgery    Sternal wound infection requiring debridement, pectoralis muscle flap repair, wound [facility] February 2017   Past Surgical History:  Procedure Laterality Date   ABDOMINAL HYSTERECTOMY     APPENDECTOMY     APPLICATION OF WOUND VAC N/A 09/04/2015   Procedure: WOUND VAC CHANGE;  Surgeon: Kerin Perna, MD;  Location: [facility] OR;  Service: Thoracic;  Laterality: N/A;   BACK SURGERY     CARDIAC CATHETERIZATION N/A 08/10/2015   Procedure: Left Heart Cath and Coronary Angiography;  Surgeon: Corky Crafts, MD;  Location: [facility] INVASIVE CV LAB;  Service: Cardiovascular;  Laterality: N/A;   CARDIAC CATHETERIZATION  08/10/2015   Procedure: Coronary Balloon Angioplasty;  Surgeon: Corky Crafts, MD;   Location: [facility] INVASIVE CV LAB;  Service: Cardiovascular;;   CHEST TUBE INSERTION Left 09/08/2015   Procedure: CHEST TUBE INSERTION;  Surgeon: Kerin Perna, MD;  Location: [facility] OR;  Service: Thoracic;  Laterality: Left;   COLONOSCOPY N/A 03/29/2013   Procedure: COLONOSCOPY;  Surgeon: Malissa Hippo, MD;  Location: [facility] ENDO SUITE;  Service: Endoscopy;  Laterality: N/A;  930   CORONARY ARTERY BYPASS GRAFT N/A 08/14/2015   Procedure: CORONARY ARTERY BYPASS GRAFTING (CABG)X3 LIMA-LAD; SVG-RAMUS; SVG-PD TRANSESOPHAGEAL ECHOCARDIOGRAM (TEE);  Surgeon: Kerin Perna, MD;  Location: [facility] OR;  Service: Open Heart Surgery;  Laterality: N/A;   PECTORALIS FLAP N/A 09/08/2015   Procedure: Muscle FLAP;  Surgeon: Alena Bills Dillingham, DO;  Location: [facility] OR;  Service: Plastics;  Laterality: N/A;   STERNAL WOUND DEBRIDEMENT N/A 09/01/2015   Procedure: STERNAL WOUND DEBRIDEMENT;  Surgeon: Kerin Perna, MD;  Location: [facility] OR;  Service: Thoracic;  Laterality: N/A;   STERNAL WOUND DEBRIDEMENT N/A 09/04/2015   Procedure: STERNAL WOUND DEBRIDEMENT;  Surgeon: Kerin Perna, MD;  Location: [facility] OR;  Service: Thoracic;  Laterality: N/A;   STERNAL WOUND DEBRIDEMENT N/A 09/08/2015   Procedure: STERNAL WOUND DEBRIDEMENT;  Surgeon: Kerin Perna, MD;  Location: [facility] OR;  Service: Thoracic;  Laterality: N/A;   TEE WITHOUT CARDIOVERSION N/A 08/14/2015   Procedure: TRANSESOPHAGEAL ECHOCARDIOGRAM (TEE);  Surgeon: Kerin Perna, MD;  Location: [facility] OR;  Service: Open Heart Surgery;  Laterality: N/A;   Patient Active Problem List   Diagnosis Date Noted   Diaphragmatic hernia with obstruction 11/26/2022   Diarrhea 11/26/2022   Other somatoform disorders 11/26/2022   IBS (irritable bowel syndrome) 03/12/2021   Hypertrophic scar 12/25/2019   Acute CVA (cerebrovascular accident) (HCC) 11/15/2018   TIA (transient ischemic attack) 11/15/2018   Wound infection after surgery 09/01/2015   Retrosternal abscess (HCC) 08/31/2015   Sternal wound  dehiscence 08/31/2015   S/P CABG x 3 08/14/2015   CAD (coronary artery disease), native coronary artery 08/11/2015   CAD (coronary artery disease)    Acute inferior myocardial infarction (HCC) 08/10/2015   Acute MI, inferior wall, initial episode of care [facility])    Essential hypertension 02/19/2015   Left knee pain 01/08/2014   AV block, 1st degree 11/21/2008   Chest pain 11/21/2008   Esophageal reflux 11/21/2008   Disorder resulting from impaired renal function 11/29/2007   Dyslipidemia 11/29/2007   Impaired fasting glucose 03/06/2007   Menopausal syndrome 03/30/2006   Constipation 09/16/2004   Venous insufficiency 05/21/2003   Allergic rhinitis 11/17/2001   Herpes zoster 06/02/1999   Edema 05/28/1999    PCP: Benita Stabile MD  REFERRING PROVIDER: Vickki Hearing, MD  REFERRING DIAG: [phone] (ICD-10-CM) - Pain in left ankle and joints of left foot, Achillies tendonitis with bone spur  THERAPY DIAG:  Pain in left ankle and joints of left foot  Other abnormalities of gait and mobility  Other symptoms and signs involving the musculoskeletal system  Rationale for Evaluation and Treatment: Rehabilitation  ONSET DATE: 1 year  SUBJECTIVE:   SUBJECTIVE STATEMENT: Pt states some pain in back of knee. Feels that PT has helped her get stronger. Patient states she has been doing HEP and remaining active. Patient states 100% improvement.   Patient states her L heel hurts. She has a bone spur and some tendonitis. Some aching and heaviness in L calf. Symptoms began with insidious onset. Symptoms have improved with Celebrex. Patient states heel is tender. Symptoms in calf mostly come every couple of months. Tried heel supports in shoe but did didn't help.   PERTINENT HISTORY: CAD, HTN, Hx MI, Hx, CVA, Large calcaneal spur near the Achilles tendon insertion PAIN:  Are you having pain? Yes: NPRS scale: 0/10 Pain location: L heel Pain description: burning Aggravating factors:  walking Relieving factors: sitting  PRECAUTIONS: None  WEIGHT BEARING RESTRICTIONS: No  FALLS:  Has patient fallen in last 6 months? No  OCCUPATION: retired   PLOF: Independent  PATIENT GOALS: more movement in L leg   OBJECTIVE:   DIAGNOSTIC FINDINGS: Imaging shows normal ankle joint normal ankle mortise osteopenia tibia and fibula large calcaneal spur on the posterior aspect of the calcaneus  Large calcaneal spur near the Achilles tendon insertion normal ankle joint  PATIENT SURVEYS: FOTO 68.7%  02/24/23: 99% function  COGNITION: Overall cognitive status: Within functional limits for tasks assessed     SENSATION: WFL  EDEMA: edema in feet bilaterally    POSTURE: No Significant postural limitations  PALPATION: No TTP throughout bilateral LE  LOWER EXTREMITY ROM:  Active ROM Right eval Left eval Right 02/24/23 Left 02/24/23  Hip flexion      Hip extension      Hip abduction      Hip adduction      Hip internal rotation      Hip external rotation      Knee flexion  Knee extension      Ankle dorsiflexion 1 2 5 3   Ankle plantarflexion 48 45 48 45  Ankle inversion 30 33    Ankle eversion 15 10     (Blank rows = not tested)  LOWER EXTREMITY MMT:  MMT Right eval Left eval  Hip flexion    Hip extension    Hip abduction    Hip adduction    Hip internal rotation    Hip external rotation    Knee flexion 5 5  Knee extension 5 5  Ankle dorsiflexion 5 5  Ankle plantarflexion    Ankle inversion 5 5  Ankle eversion 5 5   (Blank rows = not tested) Single leg heel raise: able to complete bilaterally without symptoms   FUNCTIONAL TESTS:  SLS: R 10 seconds, L: <3 seconds Stairs: 7 inch, able to complete with alternating pattern with good mechanics and unilateral UE support  GAIT: Distance walked: 226 feet Assistive device utilized: None Level of assistance: Complete Independence Comments: decreased L ankle DF   TODAY'S TREATMENT:                                                                                                                               DATE:  02/24/23 Reassessment SLS: R 17 seconds, L: 22 seconds Stairs: 7 inch, able to complete with alternating pattern with good mechanics without UE support  02/22/23 Standing:  heelraises on incline 20X  Toeraises on incline 20x  Tandem stance 1 minute each LE lead on balance beam X2  Gastroc slant board stretch 3X30"  PF stretch with 4" step 3X30" Lt only  Vectors 10X5" each with 1 HHA  Lunge onto 4" step 10X2 each no UE  Forward step ups 4" 10X2 Lt only  Lateral step ups 4" 10X2 Lt only heel taps  Forward step downs Lt on step 10X eccentric control Sit to stands 10X no UE   02/17/23 Standing:  heelraises 10X  Toeraises 10x  Tandem stance 1 minute each LE lead on balance beam X2  Gastroc slant board stretch 3X30"  PF stretch with 4" step 3X30"  Vectors 5X5" each with 1 HHA  Lunge onto 4" step 10X each no UE  Forward step ups 4" 10X Lt only  Lateral step ups 4" 10X Lt only heel taps Sit to stands 5X no UE  Dorsiflexion 10X5" holds as high as possible Windshield wipers with washcloth 10X2  02/15/23 Standing:  heelraises 10X  Toeraises 10x  Tandem stance 30" each LE X2  Gastroc slant board stretch 3X30"  PF stretch with 4" step 3X30" FOTO completion 68.7% Goal review  01/31/23 Heel raise 1 x 10  Toe raise 1 x 10     PATIENT EDUCATION:  Education details: 02/24/23: HEP, reassessment findings; EVAL: Patient educated on exam findings, POC, scope of PT, HEP Person educated: Patient Education method: Explanation, Demonstration, and Handouts Education comprehension: verbalized understanding, returned demonstration, verbal cues  required, and tactile cues required  HOME EXERCISE PROGRAM: Access Code: [email or web address] URL: [email or web address]  Date: 01/31/2023 - Heel Raises with Counter Support  - 3 x daily - 7 x weekly - 2 sets - 10 reps - Toe Raises with  Counter Support  - 3 x daily - 7 x weekly - 2 sets - 10 reps  Date: 02/15/2023 Prepared by: Emeline Gins Exercises - Plantar Fascia Stretch on Step  - 2 x daily - 7 x weekly - 2 sets - 3 reps - 30 sec hold - Tandem Stance  - 2 x daily - 7 x weekly - 1 sets - 5 reps - 30 sec hold  ASSESSMENT:  CLINICAL IMPRESSION: Patient has met 2/2 short term goals and 3/4 long term goals with ability to complete HEP and improvement in symptoms, strength, activity tolerance, gait, balance, and functional mobility. Remaining goals not met due to continued deficits in ankle DF AROM but it is partially met. Patient has made good progress toward remaining goals. Patient with great progress in single leg static balance. Patient feels ready to transition to HEP for self management and is agreeable to return if needed. Patient discharged from PT at this time.    OBJECTIVE IMPAIRMENTS: Abnormal gait, decreased activity tolerance, decreased balance, decreased endurance, decreased mobility, difficulty walking, decreased strength, impaired flexibility, improper body mechanics, and pain.   ACTIVITY LIMITATIONS: bending, standing, squatting, stairs, transfers, locomotion level, and caring for others  PARTICIPATION LIMITATIONS: meal prep, cleaning, laundry, shopping, community activity, and yard work  PERSONAL FACTORS: Age, Time since onset of injury/illness/exacerbation, and 3+ comorbidities: CAD, HTN, Hx MI, Hx, CVA, Large calcaneal spur near the Achilles tendon insertion  are also affecting patient's functional outcome.   REHAB POTENTIAL: Good  CLINICAL DECISION MAKING: Stable/uncomplicated  EVALUATION COMPLEXITY: Low   GOALS: Goals reviewed with patient? Yes  SHORT TERM GOALS: Target date: 02/14/2023    Patient will be independent with HEP in order to improve functional outcomes. Baseline: Goal status: MET  2.  Patient will report at least 25% improvement in symptoms for improved quality of  life. Baseline: Goal status: MET   LONG TERM GOALS: Target date: 02/28/2023    Patient will report at least 75% improvement in symptoms for improved quality of life. Baseline:  Goal status: MET  2.  Patient will improve FOTO score to predicted outcomes in order to indicate improved tolerance to activity. Baseline:  Goal status: MET  3.  Patient will be able to navigate stairs with reciprocal pattern without compensation in order to demonstrate improved LE strength. Baseline: states increased L heaviness in heel  02/24/23: WFL Goal status: MET  4.  Patient will demo at least 5 degrees of ankle DF bilaterally for improved gait mechanics.  Baseline: see above Goal status: METpartially    PLAN:  PT FREQUENCY: 2x/week  PT DURATION: 4 weeks  PLANNED INTERVENTIONS: Therapeutic exercises, Therapeutic activity, Neuromuscular re-education, Balance training, Gait training, Patient/Family education, Joint manipulation, Joint mobilization, Stair training, Orthotic/Fit training, DME instructions, Aquatic Therapy, Dry Needling, Electrical stimulation, Spinal manipulation, Spinal mobilization, Cryotherapy, Moist heat, Compression bandaging, scar mobilization, Splintting, Taping, Traction, Ultrasound, Ionotophoresis 4mg /ml Dexamethasone, and Manual therapy  PLAN FOR NEXT SESSION: n/a    Reola Mosher Jaaziah Schulke, PT 02/24/2023, 2:22 PM

## 2023-02-28 ENCOUNTER — Encounter (HOSPITAL_COMMUNITY): Payer: HMO | Admitting: Physical Therapy

## 2023-03-02 ENCOUNTER — Encounter (HOSPITAL_COMMUNITY): Payer: HMO | Admitting: Physical Therapy

## 2023-03-07 ENCOUNTER — Encounter (HOSPITAL_COMMUNITY): Payer: HMO | Admitting: Physical Therapy

## 2023-03-29 DIAGNOSIS — R7303 Prediabetes: Secondary | ICD-10-CM | POA: Diagnosis not present

## 2023-03-29 DIAGNOSIS — E782 Mixed hyperlipidemia: Secondary | ICD-10-CM | POA: Diagnosis not present

## 2023-04-01 ENCOUNTER — Other Ambulatory Visit (HOSPITAL_COMMUNITY): Payer: Self-pay | Admitting: Internal Medicine

## 2023-04-01 DIAGNOSIS — K5900 Constipation, unspecified: Secondary | ICD-10-CM | POA: Diagnosis not present

## 2023-04-01 DIAGNOSIS — I739 Peripheral vascular disease, unspecified: Secondary | ICD-10-CM

## 2023-04-01 DIAGNOSIS — I700 Atherosclerosis of aorta: Secondary | ICD-10-CM | POA: Diagnosis not present

## 2023-04-01 DIAGNOSIS — I2510 Atherosclerotic heart disease of native coronary artery without angina pectoris: Secondary | ICD-10-CM | POA: Diagnosis not present

## 2023-04-01 DIAGNOSIS — J309 Allergic rhinitis, unspecified: Secondary | ICD-10-CM | POA: Diagnosis not present

## 2023-04-01 DIAGNOSIS — I10 Essential (primary) hypertension: Secondary | ICD-10-CM | POA: Diagnosis not present

## 2023-04-01 DIAGNOSIS — R7303 Prediabetes: Secondary | ICD-10-CM | POA: Diagnosis not present

## 2023-04-01 DIAGNOSIS — R1319 Other dysphagia: Secondary | ICD-10-CM | POA: Diagnosis not present

## 2023-04-01 DIAGNOSIS — M25562 Pain in left knee: Secondary | ICD-10-CM | POA: Diagnosis not present

## 2023-04-01 DIAGNOSIS — E870 Hyperosmolality and hypernatremia: Secondary | ICD-10-CM | POA: Diagnosis not present

## 2023-04-01 DIAGNOSIS — N3281 Overactive bladder: Secondary | ICD-10-CM | POA: Diagnosis not present

## 2023-04-01 DIAGNOSIS — D696 Thrombocytopenia, unspecified: Secondary | ICD-10-CM | POA: Diagnosis not present

## 2023-04-01 DIAGNOSIS — E782 Mixed hyperlipidemia: Secondary | ICD-10-CM | POA: Diagnosis not present

## 2023-04-04 ENCOUNTER — Ambulatory Visit (HOSPITAL_COMMUNITY)
Admission: RE | Admit: 2023-04-04 | Discharge: 2023-04-04 | Disposition: A | Discharge status: Home / Self Care | Payer: HMO | Source: Ambulatory Visit | Attending: Internal Medicine | Admitting: Internal Medicine

## 2023-04-04 DIAGNOSIS — I739 Peripheral vascular disease, unspecified: Secondary | ICD-10-CM | POA: Diagnosis not present

## 2023-04-04 DIAGNOSIS — M79605 Pain in left leg: Secondary | ICD-10-CM | POA: Diagnosis not present

## 2023-04-04 DIAGNOSIS — M79604 Pain in right leg: Secondary | ICD-10-CM | POA: Diagnosis not present

## 2023-04-27 DIAGNOSIS — J309 Allergic rhinitis, unspecified: Secondary | ICD-10-CM | POA: Diagnosis not present

## 2023-04-27 DIAGNOSIS — Z79899 Other long term (current) drug therapy: Secondary | ICD-10-CM | POA: Diagnosis not present

## 2023-04-27 DIAGNOSIS — Z6823 Body mass index (BMI) 23.0-23.9, adult: Secondary | ICD-10-CM | POA: Diagnosis not present

## 2023-04-27 DIAGNOSIS — Z23 Encounter for immunization: Secondary | ICD-10-CM | POA: Diagnosis not present

## 2023-04-27 DIAGNOSIS — I10 Essential (primary) hypertension: Secondary | ICD-10-CM | POA: Diagnosis not present

## 2023-04-27 DIAGNOSIS — Z713 Dietary counseling and surveillance: Secondary | ICD-10-CM | POA: Diagnosis not present

## 2023-08-29 DIAGNOSIS — M25561 Pain in right knee: Secondary | ICD-10-CM | POA: Diagnosis not present

## 2023-09-26 DIAGNOSIS — Z7182 Exercise counseling: Secondary | ICD-10-CM | POA: Diagnosis not present

## 2023-09-26 DIAGNOSIS — M25561 Pain in right knee: Secondary | ICD-10-CM | POA: Diagnosis not present

## 2023-09-26 DIAGNOSIS — R252 Cramp and spasm: Secondary | ICD-10-CM | POA: Diagnosis not present

## 2023-10-17 ENCOUNTER — Ambulatory Visit: Payer: HMO | Attending: Cardiology | Admitting: Cardiology

## 2023-10-17 ENCOUNTER — Encounter: Payer: Self-pay | Admitting: Cardiology

## 2023-10-17 VITALS — BP 136/70 | HR 60 | Ht 69.0 in | Wt 169.2 lb

## 2023-10-17 DIAGNOSIS — I10 Essential (primary) hypertension: Secondary | ICD-10-CM | POA: Diagnosis not present

## 2023-10-17 DIAGNOSIS — E782 Mixed hyperlipidemia: Secondary | ICD-10-CM | POA: Diagnosis not present

## 2023-10-17 DIAGNOSIS — I25119 Atherosclerotic heart disease of native coronary artery with unspecified angina pectoris: Secondary | ICD-10-CM | POA: Diagnosis not present

## 2023-10-17 NOTE — Progress Notes (Signed)
    Cardiology Office Note  Date: 10/17/2023   ID: [name] [name] 06/08/1941, MRN [id number]  History of Present Illness: [name] [name] is an 83 y.o. female last seen in April 2024.  She is here for a routine visit.  Reports no interval exertional chest pain or increasing dyspnea on exertion, no palpitations or syncope.  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  We went over her medications today.  She reports compliance with therapy.  I did go over her interval lab work, LDL was 42 in September 2024.  I reviewed her ECG today which shows sinus rhythm with prolonged PR interval and nonspecific ST-T changes.  Physical Exam: VS:  BP 136/70   Pulse 60   Ht 5\' 9"  (1.753 m)   Wt 169 lb 3.2 oz (76.7 kg)   SpO2 97%   BMI 24.99 kg/m , BMI Body mass index is 24.99 kg/m.  Wt Readings from Last 3 Encounters:  10/17/23 169 lb 3.2 oz (76.7 kg)  11/29/22 170 lb (77.1 kg)  10/27/22 170 lb 9.6 oz (77.4 kg)    General: Patient appears comfortable at rest. HEENT: Conjunctiva and lids normal. Neck: Supple, no elevated JVP or carotid bruits. Lungs: Clear to auscultation, nonlabored breathing at rest. Cardiac: Regular rate and rhythm, no S3, 1/6 systolic murmur. Extremities: No pitting edema.  ECG:  An ECG dated 10/27/2022 was personally reviewed today and demonstrated:  Sinus bradycardia with prolonged PR interval and nonspecific ST-T changes.  Labwork:  September 2024: Hemoglobin 12, platelets 133, cholesterol 106, triglycerides 45, HDL 53, LDL 42, hemoglobin A1c 5.9% March 2025: BUN 17, creatinine 0.8, potassium 3.6, magnesium 2.1,   Other Studies Reviewed Today:  No interval cardiac testing for review today.  Assessment and Plan:  1.  Multivessel CAD status post angioplasty of the PDA followed by CABG in 2017 with LIMA to LAD, SVG to ramus, and SVG to PDA.  Postoperative course complicated by sternal wound infection.  LVEF 60 to 65% by echocardiogram in April 2020.   She remains clinically stable without angina and continues on medical therapy.  ECG reviewed.  Continue aspirin 81 mg daily, Lopressor 75 mg twice daily, and Lipitor 40 mg daily.   2.  Primary hypertension.  She is also on Hyzaar 100/25 mg daily and Norvasc 10 mg daily, no changes were made.   3.  Mixed hyperlipidemia.  LDL 42 in September 2024.  Continue Lipitor 40 mg daily.  Disposition:  Follow up  1 year.  Signed, Jonelle Sidle, M.D., F.A.C.C. [facility]

## 2023-10-17 NOTE — Patient Instructions (Signed)
 Medication Instructions:  Your physician recommends that you continue on your current medications as directed. Please refer to the Current Medication list given to you today.   Labwork: None today  Testing/Procedures: None today  Follow-Up: 1 year  Any Other Special Instructions Will Be Listed Below (If Applicable).  If you need a refill on your cardiac medications before your next appointment, please call your pharmacy.

## 2023-10-19 DIAGNOSIS — R7303 Prediabetes: Secondary | ICD-10-CM | POA: Diagnosis not present

## 2023-10-19 DIAGNOSIS — E782 Mixed hyperlipidemia: Secondary | ICD-10-CM | POA: Diagnosis not present

## 2023-10-25 DIAGNOSIS — I10 Essential (primary) hypertension: Secondary | ICD-10-CM | POA: Diagnosis not present

## 2023-10-25 DIAGNOSIS — I2510 Atherosclerotic heart disease of native coronary artery without angina pectoris: Secondary | ICD-10-CM | POA: Diagnosis not present

## 2023-10-25 DIAGNOSIS — J309 Allergic rhinitis, unspecified: Secondary | ICD-10-CM | POA: Diagnosis not present

## 2023-10-25 DIAGNOSIS — N3281 Overactive bladder: Secondary | ICD-10-CM | POA: Diagnosis not present

## 2023-10-25 DIAGNOSIS — M25561 Pain in right knee: Secondary | ICD-10-CM | POA: Diagnosis not present

## 2023-10-25 DIAGNOSIS — M25562 Pain in left knee: Secondary | ICD-10-CM | POA: Diagnosis not present

## 2023-10-25 DIAGNOSIS — Z8679 Personal history of other diseases of the circulatory system: Secondary | ICD-10-CM | POA: Diagnosis not present

## 2023-10-25 DIAGNOSIS — E782 Mixed hyperlipidemia: Secondary | ICD-10-CM | POA: Diagnosis not present

## 2023-10-25 DIAGNOSIS — R7303 Prediabetes: Secondary | ICD-10-CM | POA: Diagnosis not present

## 2023-10-25 DIAGNOSIS — Z0001 Encounter for general adult medical examination with abnormal findings: Secondary | ICD-10-CM | POA: Diagnosis not present

## 2023-10-25 DIAGNOSIS — R252 Cramp and spasm: Secondary | ICD-10-CM | POA: Diagnosis not present

## 2023-10-25 DIAGNOSIS — D696 Thrombocytopenia, unspecified: Secondary | ICD-10-CM | POA: Diagnosis not present

## 2023-11-06 ENCOUNTER — Encounter (HOSPITAL_COMMUNITY): Payer: Self-pay | Admitting: *Deleted

## 2023-11-06 ENCOUNTER — Emergency Department (HOSPITAL_COMMUNITY)
Admission: EM | Admit: 2023-11-06 | Discharge: 2023-11-06 | Disposition: A | Discharge status: Home / Self Care | Attending: Emergency Medicine | Admitting: Emergency Medicine

## 2023-11-06 ENCOUNTER — Emergency Department (HOSPITAL_COMMUNITY)

## 2023-11-06 ENCOUNTER — Other Ambulatory Visit: Payer: Self-pay

## 2023-11-06 DIAGNOSIS — R109 Unspecified abdominal pain: Secondary | ICD-10-CM | POA: Diagnosis not present

## 2023-11-06 DIAGNOSIS — K449 Diaphragmatic hernia without obstruction or gangrene: Secondary | ICD-10-CM | POA: Diagnosis not present

## 2023-11-06 DIAGNOSIS — I10 Essential (primary) hypertension: Secondary | ICD-10-CM | POA: Insufficient documentation

## 2023-11-06 DIAGNOSIS — D696 Thrombocytopenia, unspecified: Secondary | ICD-10-CM | POA: Diagnosis not present

## 2023-11-06 DIAGNOSIS — Z79899 Other long term (current) drug therapy: Secondary | ICD-10-CM | POA: Diagnosis not present

## 2023-11-06 DIAGNOSIS — Z955 Presence of coronary angioplasty implant and graft: Secondary | ICD-10-CM | POA: Insufficient documentation

## 2023-11-06 DIAGNOSIS — N390 Urinary tract infection, site not specified: Secondary | ICD-10-CM | POA: Diagnosis not present

## 2023-11-06 DIAGNOSIS — Z7982 Long term (current) use of aspirin: Secondary | ICD-10-CM | POA: Insufficient documentation

## 2023-11-06 DIAGNOSIS — I2510 Atherosclerotic heart disease of native coronary artery without angina pectoris: Secondary | ICD-10-CM | POA: Diagnosis not present

## 2023-11-06 DIAGNOSIS — D72819 Decreased white blood cell count, unspecified: Secondary | ICD-10-CM | POA: Diagnosis not present

## 2023-11-06 DIAGNOSIS — Z8673 Personal history of transient ischemic attack (TIA), and cerebral infarction without residual deficits: Secondary | ICD-10-CM | POA: Diagnosis not present

## 2023-11-06 DIAGNOSIS — K5730 Diverticulosis of large intestine without perforation or abscess without bleeding: Secondary | ICD-10-CM | POA: Diagnosis not present

## 2023-11-06 DIAGNOSIS — R1031 Right lower quadrant pain: Secondary | ICD-10-CM | POA: Diagnosis present

## 2023-11-06 DIAGNOSIS — Z91010 Allergy to peanuts: Secondary | ICD-10-CM | POA: Insufficient documentation

## 2023-11-06 DIAGNOSIS — Z90710 Acquired absence of both cervix and uterus: Secondary | ICD-10-CM | POA: Diagnosis not present

## 2023-11-06 LAB — CBC WITH DIFFERENTIAL/PLATELET
Abs Immature Granulocytes: 0.01 10*3/uL (ref 0.00–0.07)
Basophils Absolute: 0 10*3/uL (ref 0.0–0.1)
Basophils Relative: 0 %
Eosinophils Absolute: 0.1 10*3/uL (ref 0.0–0.5)
Eosinophils Relative: 2 %
HCT: 37.7 % (ref 36.0–46.0)
Hemoglobin: 12.1 g/dL (ref 12.0–15.0)
Immature Granulocytes: 0 %
Lymphocytes Relative: 25 %
Lymphs Abs: 0.9 10*3/uL (ref 0.7–4.0)
MCH: 29.8 pg (ref 26.0–34.0)
MCHC: 32.1 g/dL (ref 30.0–36.0)
MCV: 92.9 fL (ref 80.0–100.0)
Monocytes Absolute: 0.4 10*3/uL (ref 0.1–1.0)
Monocytes Relative: 11 %
Neutro Abs: 2.2 10*3/uL (ref 1.7–7.7)
Neutrophils Relative %: 62 %
Platelets: 127 10*3/uL — ABNORMAL LOW (ref 150–400)
RBC: 4.06 MIL/uL (ref 3.87–5.11)
RDW: 13.3 % (ref 11.5–15.5)
WBC: 3.6 10*3/uL — ABNORMAL LOW (ref 4.0–10.5)
nRBC: 0 % (ref 0.0–0.2)

## 2023-11-06 LAB — URINALYSIS, W/ REFLEX TO CULTURE (INFECTION SUSPECTED)
Bacteria, UA: NONE SEEN
Bilirubin Urine: NEGATIVE
Glucose, UA: NEGATIVE mg/dL
Hgb urine dipstick: NEGATIVE
Ketones, ur: NEGATIVE mg/dL
Nitrite: NEGATIVE
Protein, ur: 30 mg/dL — AB
Specific Gravity, Urine: 1.016 (ref 1.005–1.030)
pH: 6 (ref 5.0–8.0)

## 2023-11-06 LAB — COMPREHENSIVE METABOLIC PANEL WITH GFR
ALT: 14 U/L (ref 0–44)
AST: 16 U/L (ref 15–41)
Albumin: 3.8 g/dL (ref 3.5–5.0)
Alkaline Phosphatase: 60 U/L (ref 38–126)
Anion gap: 9 (ref 5–15)
BUN: 12 mg/dL (ref 8–23)
CO2: 26 mmol/L (ref 22–32)
Calcium: 9.4 mg/dL (ref 8.9–10.3)
Chloride: 104 mmol/L (ref 98–111)
Creatinine, Ser: 0.71 mg/dL (ref 0.44–1.00)
GFR, Estimated: >60 mL/min (ref >60)
Glucose, Bld: 95 mg/dL (ref 70–99)
Potassium: 3.5 mmol/L (ref 3.5–5.1)
Sodium: 139 mmol/L (ref 135–145)
Total Bilirubin: 0.9 mg/dL (ref 0.0–1.2)
Total Protein: 6.5 g/dL (ref 6.5–8.1)

## 2023-11-06 MED ORDER — CEFDINIR 300 MG PO CAPS: 300.0000 mg | ORAL_CAPSULE | Freq: Two times a day (BID) | ORAL | 0 refills | Status: AC

## 2023-11-06 MED ORDER — IOHEXOL 300 MG/ML  SOLN
100.0000 mL | Freq: Once | INTRAMUSCULAR | Status: AC | PRN
  Administered 2023-11-06: 100 mL via INTRAVENOUS

## 2023-11-06 NOTE — ED Triage Notes (Signed)
 Pt with RLQ that radiates around to back x 2 days. Denies any N/V/D. Pt noticed she is urinating more frequently, denies any pain with urination. Denies any fevers or chills.

## 2023-11-06 NOTE — ED Provider Notes (Signed)
 [place] EMERGENCY DEPARTMENT AT [facility] Provider Note  CSN: [id number] Arrival date & time: 11/06/23 1044  Chief Complaint(s) Abdominal Pain  HPI [name] [name] is a 83 y.o. female history of coronary artery disease, prior stroke, hyperlipidemia, presenting to the emergency department with abdominal pain.  Patient reports abdominal pain and right flank pain radiating down to the right lower quadrant, reports associated urinary frequency.  Pain seems to worsen when she is urinating.  No nausea or vomiting.  No diarrhea.  No fevers or chills.  No weakness.  No lightheadedness or dizziness.  No fainting.   Past Medical History Past Medical History:  Diagnosis Date   CAD (coronary artery disease)    Multivessel, PTCA of posterior descending followed by CABG January 2017   Essential hypertension    ST elevation myocardial infarction (STEMI) of inferior wall [facility])    January 2017   Stroke [facility])    April 2020   Wound infection after surgery    Sternal wound infection requiring debridement, pectoralis muscle flap repair, wound [facility] February 2017   Patient Active Problem List   Diagnosis Date Noted   Diaphragmatic hernia with obstruction 11/26/2022   Diarrhea 11/26/2022   Other somatoform disorders 11/26/2022   IBS (irritable bowel syndrome) 03/12/2021   Hypertrophic scar 12/25/2019   Acute CVA (cerebrovascular accident) (HCC) 11/15/2018   TIA (transient ischemic attack) 11/15/2018   Wound infection after surgery 09/01/2015   Retrosternal abscess (HCC) 08/31/2015   Sternal wound dehiscence 08/31/2015   S/P CABG x 3 08/14/2015   CAD (coronary artery disease), native coronary artery 08/11/2015   CAD (coronary artery disease)    Acute inferior myocardial infarction (HCC) 08/10/2015   Acute MI, inferior wall, initial episode of care [facility])    Essential hypertension 02/19/2015   Left knee pain 01/08/2014   AV block, 1st degree 11/21/2008   Chest pain 11/21/2008    Esophageal reflux 11/21/2008   Disorder resulting from impaired renal function 11/29/2007   Dyslipidemia 11/29/2007   Impaired fasting glucose 03/06/2007   Menopausal syndrome 03/30/2006   Constipation 09/16/2004   Venous insufficiency 05/21/2003   Allergic rhinitis 11/17/2001   Herpes zoster 06/02/1999   Edema 05/28/1999   Home Medication(s) Prior to Admission medications   Medication Sig Start Date End Date Taking? Authorizing Provider  cefdinir (OMNICEF) 300 MG capsule Take 1 capsule (300 mg total) by mouth 2 (two) times daily for 7 days. 11/06/23 11/13/23 Yes Mordecai Applebaum, MD  amLODipine (NORVASC) 10 MG tablet Take 10 mg by mouth daily.    [provider]  aspirin EC 81 MG tablet Take 1 tablet (81 mg total) by mouth daily. Swallow whole. 10/27/22   Gerard Knight, MD  atorvastatin (LIPITOR) 40 MG tablet Take 40 mg by mouth daily.    [provider]  celecoxib (CELEBREX) 100 MG capsule Take 100 mg by mouth daily. 11/25/22   [provider]  losartan-hydrochlorothiazide (HYZAAR) 100-25 MG tablet Take 1 tablet by mouth daily.  03/05/16   [provider]  metoprolol tartrate (LOPRESSOR) 50 MG tablet Take 1.5 tablets (75 mg total) by mouth 2 (two) times daily. 05/15/19   Gerard Knight, MD  potassium chloride 20 MEQ/15ML (10%) SOLN Take 20 mEq by mouth daily. 09/27/23   [provider]  Past Surgical History Past Surgical History:  Procedure Laterality Date   ABDOMINAL HYSTERECTOMY     APPENDECTOMY     APPLICATION OF WOUND VAC N/A 09/04/2015   Procedure: WOUND VAC CHANGE;  Surgeon: Heriberto London, MD;  Location: [facility] OR;  Service: Thoracic;  Laterality: N/A;   BACK SURGERY     CARDIAC CATHETERIZATION N/A 08/10/2015   Procedure: Left Heart Cath and Coronary Angiography;  Surgeon: Lucendia Rusk, MD;   Location: [facility] INVASIVE CV LAB;  Service: Cardiovascular;  Laterality: N/A;   CARDIAC CATHETERIZATION  08/10/2015   Procedure: Coronary Balloon Angioplasty;  Surgeon: Lucendia Rusk, MD;  Location: [facility] INVASIVE CV LAB;  Service: Cardiovascular;;   CHEST TUBE INSERTION Left 09/08/2015   Procedure: CHEST TUBE INSERTION;  Surgeon: Heriberto London, MD;  Location[facility] OR;  Service: Thoracic;  Laterality: Left;   COLONOSCOPY N/A 03/29/2013   Procedure: COLONOSCOPY;  Surgeon: Ruby Corporal, MD;  Location: [facility] ENDO SUITE;  Service: Endoscopy;  Laterality: N/A;  930   CORONARY ARTERY BYPASS GRAFT N/A 08/14/2015   Procedure: CORONARY ARTERY BYPASS GRAFTING (CABG)X3 LIMA-LAD; SVG-RAMUS; SVG-PD TRANSESOPHAGEAL ECHOCARDIOGRAM (TEE);  Surgeon: Heriberto London, MD;  Location: [facility] OR;  Service: Open Heart Surgery;  Laterality: N/A;   PECTORALIS FLAP N/A 09/08/2015   Procedure: Muscle FLAP;  Surgeon: Lindaann Requena Dillingham, DO;  Location: [facility] OR;  Service: Plastics;  Laterality: N/A;   STERNAL WOUND DEBRIDEMENT N/A 09/01/2015   Procedure: STERNAL WOUND DEBRIDEMENT;  Surgeon: Heriberto London, MD;  Location: [facility] OR;  Service: Thoracic;  Laterality: N/A;   STERNAL WOUND DEBRIDEMENT N/A 09/04/2015   Procedure: STERNAL WOUND DEBRIDEMENT;  Surgeon: Heriberto London, MD;  Location: [facility] OR;  Service: Thoracic;  Laterality: N/A;   STERNAL WOUND DEBRIDEMENT N/A 09/08/2015   Procedure: STERNAL WOUND DEBRIDEMENT;  Surgeon: Heriberto London, MD;  Location: [facility] OR;  Service: Thoracic;  Laterality: N/A;   TEE WITHOUT CARDIOVERSION N/A 08/14/2015   Procedure: TRANSESOPHAGEAL ECHOCARDIOGRAM (TEE);  Surgeon: Heriberto London, MD;  Location: [facility] OR;  Service: Open Heart Surgery;  Laterality: N/A;   Family History Family History  Problem Relation Age of Onset   Colon cancer Neg Hx     Social History Social History   Tobacco Use   Smoking status: Never   Smokeless tobacco: Never  Vaping Use   Vaping status: Never Used  Substance Use Topics    Alcohol use: No    Alcohol/week: 0.0 standard drinks of alcohol   Drug use: No   Allergies Other and Peanut-containing drug products  Review of Systems Review of Systems  All other systems reviewed and are negative.   Physical Exam Vital Signs  I have reviewed the triage vital signs BP (!) 173/75 (BP Location: Right Arm)   Pulse (!) 59   Temp 98 F (36.7 C) (Oral)   Resp 18   Ht 5\' 9"  (1.753 m)   Wt 76.7 kg   SpO2 95%   BMI 24.96 kg/m  Physical Exam Vitals and nursing note reviewed.  Constitutional:      General: She is not in acute distress.    Appearance: She is well-developed.  HENT:     Head: Normocephalic and atraumatic.     Mouth/Throat:     Mouth: Mucous membranes are moist.  Eyes:     Pupils: Pupils are equal, round, and reactive to light.  Cardiovascular:     Rate and Rhythm: Normal rate and regular rhythm.  Heart sounds: No murmur heard. Pulmonary:     Effort: Pulmonary effort is normal. No respiratory distress.     Breath sounds: Normal breath sounds.  Abdominal:     General: Abdomen is flat.     Palpations: Abdomen is soft.     Tenderness: There is abdominal tenderness in the right lower quadrant. There is right CVA tenderness.  Musculoskeletal:        General: No tenderness.     Right lower leg: No edema.     Left lower leg: No edema.  Skin:    General: Skin is warm and dry.  Neurological:     General: No focal deficit present.     Mental Status: She is alert. Mental status is at baseline.  Psychiatric:        Mood and Affect: Mood normal.        Behavior: Behavior normal.     ED Results and Treatments Labs (all labs ordered are listed, but only abnormal results are displayed) Labs Reviewed  CBC WITH DIFFERENTIAL/PLATELET - Abnormal; Notable for the following components:      Result Value   WBC 3.6 (*)    Platelets 127 (*)    All other components within normal limits  URINALYSIS, W/ REFLEX TO CULTURE (INFECTION SUSPECTED) -  Abnormal; Notable for the following components:   APPearance HAZY (*)    Protein, ur 30 (*)    Leukocytes,Ua MODERATE (*)    All other components within normal limits  URINE CULTURE  COMPREHENSIVE METABOLIC PANEL WITH GFR                                                                                                                          Radiology CT ABDOMEN PELVIS W CONTRAST Result Date: 11/06/2023 CLINICAL DATA:  Acute abdominal pain EXAM: CT ABDOMEN AND PELVIS WITH CONTRAST TECHNIQUE: Multidetector CT imaging of the abdomen and pelvis was performed using the standard protocol following bolus administration of intravenous contrast. RADIATION DOSE REDUCTION: This exam was performed according to the departmental dose-optimization program which includes automated exposure control, adjustment of the mA and/or kV according to patient size and/or use of iterative reconstruction technique. CONTRAST:  100mL OMNIPAQUE IOHEXOL 300 MG/ML  SOLN COMPARISON:  CT of the abdomen pelvis performed January 14, 2018 FINDINGS: Lower chest: Nothing significant. Hepatobiliary: No focal liver abnormality is seen. No gallstones, gallbladder wall thickening, or biliary dilatation. Pancreas: Unremarkable. No pancreatic ductal dilatation or surrounding inflammatory changes. Spleen: Normal in size without focal abnormality. Adrenals/Urinary Tract: Focal renal cortical scarring on the right. A simple cyst is favored in the left kidney which measures 2.7 cm. The urinary bladder is decompressed. Stomach/Bowel: Small hiatal hernia. No dilated loops of bowel are appreciated. A moderate volume of stool material is present in the colon. Scattered areas of colonic diverticulosis. Vascular/Lymphatic: No abdominal aortic aneurysm. Mild atherosclerotic changes. Reproductive: Uterus is surgically absent. Other: The left lateral pelvic wall hernia is annotated. There is no  evidence of obstruction at this site. Musculoskeletal: Degenerative  changes are present in the imaged osseous structures. IMPRESSION: 1. No CT evidence of acute process. 2. Diverticulosis without evidence of diverticulitis. Electronically Signed   By: Reagan Camera M.D.   On: 11/06/2023 13:22    Pertinent labs & imaging results that were available during my care of the patient were reviewed by me and considered in my medical decision making (see MDM for details).  Medications Ordered in ED Medications  iohexol (OMNIPAQUE) 300 MG/ML solution 100 mL (100 mLs Intravenous Contrast Given 11/06/23 1303)                                                                                                                                     Procedures Procedures  (including critical care time)  Medical Decision Making / ED Course   MDM:  83 year old presenting to the emergency department with flank and abdominal pain.  Patient overall quite well-appearing, vital signs reassuring.  No fever.  Exam with very slight right CVA right lower quadrant tenderness.  Differential includes UTI, pyelonephritis, nephrolithiasis, appendicitis, cholecystitis, obstruction, volvulus, abscess, perforation.  Will obtain CT scan as well as labs and urinalysis.  Will reassess.  Patient declines pain medication at this time and says she feels well.  Clinical Course as of 11/06/23 1343  Sun Nov 06, 2023  1341 CT scan negative for any acute process.  Laboratory testing overall reassuring.  Patient has chronic thrombocytopenia which she is already aware about.  Urinalysis without bacteria, some white blood cells, also some squamous cells.  Given that she is having some urinary symptoms and lower abdominal pain, without other clear identifiable cause of her symptoms, will treat for possible UTI, and urine culture.  Discussed with the patient who is agreeable to this plan.  Recommended close follow-up with her primary care doctor. Will discharge patient to home. All questions answered. Patient  comfortable with plan of discharge. Return precautions discussed with patient and specified on the after visit summary.  [WS]    Clinical Course User Index [WS] Isaiah Marc, Dozier Genre, MD     Additional history obtained: -Additional history obtained from family -External records from outside source obtained and reviewed including: Chart review including previous notes, labs, imaging, consultation notes including prior ER visit    Lab Tests: -I ordered, reviewed, and interpreted labs.   The pertinent results include:   Labs Reviewed  CBC WITH DIFFERENTIAL/PLATELET - Abnormal; Notable for the following components:      Result Value   WBC 3.6 (*)    Platelets 127 (*)    All other components within normal limits  URINALYSIS, W/ REFLEX TO CULTURE (INFECTION SUSPECTED) - Abnormal; Notable for the following components:   APPearance HAZY (*)    Protein, ur 30 (*)    Leukocytes,Ua MODERATE (*)    All other components within normal limits  URINE CULTURE  COMPREHENSIVE METABOLIC  PANEL WITH GFR    Notable for +leukocytes, no bacteria. Chronic leukopenia and thrombocytopenia   Imaging Studies ordered: I ordered imaging studies including CT scan On my interpretation imaging demonstrates no acute process I independently visualized and interpreted imaging. I agree with the radiologist interpretation   Medicines ordered and prescription drug management: Meds ordered this encounter  Medications   iohexol (OMNIPAQUE) 300 MG/ML solution 100 mL   cefdinir (OMNICEF) 300 MG capsule    Sig: Take 1 capsule (300 mg total) by mouth 2 (two) times daily for 7 days.    Dispense:  14 capsule    Refill:  0    -I have reviewed the patients home medicines and have made adjustments as needed  Co morbidities that complicate the patient evaluation  Past Medical History:  Diagnosis Date   CAD (coronary artery disease)    Multivessel, PTCA of posterior descending followed by CABG January 2017    Essential hypertension    ST elevation myocardial infarction (STEMI) of inferior wall [facility])    January 2017   Stroke [facility])    April 2020   Wound infection after surgery    Sternal wound infection requiring debridement, pectoralis muscle flap repair, wound [facility] February 2017      Dispostion: Disposition decision including need for hospitalization was considered, and patient discharged from emergency department.    Final Clinical Impression(s) / ED Diagnoses Final diagnoses:  Lower urinary tract infectious disease     This chart was dictated using voice recognition software.  Despite best efforts to proofread,  errors can occur which can change the documentation meaning.    Mordecai Applebaum, MD 11/06/23 1343

## 2023-11-06 NOTE — Discharge Instructions (Addendum)
 We evaluated you for your lower abdominal pain and urinary symptoms.  Your testing was reassuring.  Your CT scan did not show any dangerous infection or kidney stones.  Your urine did not have bacteria in it but since you are having some symptoms of a urine infection, we will treat you with antibiotics.  Please take at 1000 mg of Tylenol every 6 hours as needed for pain or discomfort.  Please follow-up closely with your primary doctor.  Please return if you develop any new or worsening symptoms such as increasing pain, lightheadedness or dizziness, fevers or chills, nausea or vomiting, diarrhea or blood in your stool, or any other new symptoms.

## 2023-11-07 LAB — URINE CULTURE: Culture: <10000 — AB

## 2023-11-08 DIAGNOSIS — R1030 Lower abdominal pain, unspecified: Secondary | ICD-10-CM | POA: Diagnosis not present

## 2024-02-06 DIAGNOSIS — M79675 Pain in left toe(s): Secondary | ICD-10-CM | POA: Diagnosis not present

## 2024-02-06 DIAGNOSIS — M79674 Pain in right toe(s): Secondary | ICD-10-CM | POA: Diagnosis not present

## 2024-02-06 DIAGNOSIS — B351 Tinea unguium: Secondary | ICD-10-CM | POA: Diagnosis not present

## 2024-02-06 DIAGNOSIS — R739 Hyperglycemia, unspecified: Secondary | ICD-10-CM | POA: Diagnosis not present

## 2024-02-06 DIAGNOSIS — I739 Peripheral vascular disease, unspecified: Secondary | ICD-10-CM | POA: Diagnosis not present

## 2024-02-27 ENCOUNTER — Encounter: Payer: Self-pay | Admitting: Orthopedic Surgery

## 2024-02-27 ENCOUNTER — Ambulatory Visit: Admitting: Orthopedic Surgery

## 2024-02-27 ENCOUNTER — Other Ambulatory Visit (INDEPENDENT_AMBULATORY_CARE_PROVIDER_SITE_OTHER): Payer: Self-pay

## 2024-02-27 ENCOUNTER — Other Ambulatory Visit: Payer: Self-pay

## 2024-02-27 VITALS — BP 179/93 | HR 63 | Ht 69.0 in | Wt 169.0 lb

## 2024-02-27 DIAGNOSIS — M1711 Unilateral primary osteoarthritis, right knee: Secondary | ICD-10-CM

## 2024-02-27 DIAGNOSIS — M170 Bilateral primary osteoarthritis of knee: Secondary | ICD-10-CM

## 2024-02-27 DIAGNOSIS — M1712 Unilateral primary osteoarthritis, left knee: Secondary | ICD-10-CM

## 2024-02-27 NOTE — Progress Notes (Signed)
   BP (!) 179/93   Pulse 63   Ht 5' 9 (1.753 m)   Wt 169 lb (76.7 kg)   BMI 24.96 kg/m   Body mass index is 24.96 kg/m.  Chief Complaint  Patient presents with   Knee Pain    Encounter Diagnoses  Name Primary?   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  Stiffness after sitting  2.  Drawing behind the knee  3.  Crepitance right knee  4.  Cramping both legs  The patient says she can do everything that she wants to do she just has the stiffness and the drawing or pulling sensation behind her knees.  The crunching noise in the right knee does not cause pain.  Examination shows excellent range of motion of both hips and knees with no effusion or tenderness in the joint lines.  There is some fullness in the back of each knee with some tenderness there.  There does not appear to be any radicular findings  Images were taken the patient has mild arthritis in her knees  I really do not have an answer as to the cause of her symptoms  I recommend that she learn to deal with the stiffness which may be from arthritis and the cramping

## 2024-02-27 NOTE — Progress Notes (Signed)
   There were no vitals taken for this visit.  There is no height or weight on file to calculate BMI.  Chief Complaint  Patient presents with   Knee Pain    No diagnosis found.  DOI/DOS/ Date: ongoing  Unchanged States backs of knees are painful / Dr Margrette told her it was the nerves She states left knee cracks

## 2024-03-26 DIAGNOSIS — J069 Acute upper respiratory infection, unspecified: Secondary | ICD-10-CM | POA: Diagnosis not present

## 2024-03-26 DIAGNOSIS — I10 Essential (primary) hypertension: Secondary | ICD-10-CM | POA: Diagnosis not present

## 2024-03-26 DIAGNOSIS — I252 Old myocardial infarction: Secondary | ICD-10-CM | POA: Diagnosis not present

## 2024-03-26 DIAGNOSIS — R051 Acute cough: Secondary | ICD-10-CM | POA: Diagnosis not present

## 2024-03-26 DIAGNOSIS — U071 COVID-19: Secondary | ICD-10-CM | POA: Diagnosis not present

## 2024-03-26 DIAGNOSIS — H9209 Otalgia, unspecified ear: Secondary | ICD-10-CM | POA: Diagnosis not present

## 2024-04-19 DIAGNOSIS — R252 Cramp and spasm: Secondary | ICD-10-CM | POA: Diagnosis not present

## 2024-04-19 DIAGNOSIS — R7303 Prediabetes: Secondary | ICD-10-CM | POA: Diagnosis not present

## 2024-04-19 DIAGNOSIS — E782 Mixed hyperlipidemia: Secondary | ICD-10-CM | POA: Diagnosis not present

## 2024-04-25 DIAGNOSIS — Z23 Encounter for immunization: Secondary | ICD-10-CM | POA: Diagnosis not present

## 2024-04-25 DIAGNOSIS — R252 Cramp and spasm: Secondary | ICD-10-CM | POA: Diagnosis not present

## 2024-04-25 DIAGNOSIS — D696 Thrombocytopenia, unspecified: Secondary | ICD-10-CM | POA: Diagnosis not present

## 2024-04-25 DIAGNOSIS — E782 Mixed hyperlipidemia: Secondary | ICD-10-CM | POA: Diagnosis not present

## 2024-04-25 DIAGNOSIS — I10 Essential (primary) hypertension: Secondary | ICD-10-CM | POA: Diagnosis not present

## 2024-04-25 DIAGNOSIS — Z0001 Encounter for general adult medical examination with abnormal findings: Secondary | ICD-10-CM | POA: Diagnosis not present

## 2024-04-25 DIAGNOSIS — J302 Other seasonal allergic rhinitis: Secondary | ICD-10-CM | POA: Diagnosis not present

## 2024-04-25 DIAGNOSIS — R7303 Prediabetes: Secondary | ICD-10-CM | POA: Diagnosis not present

## 2024-04-25 DIAGNOSIS — J309 Allergic rhinitis, unspecified: Secondary | ICD-10-CM | POA: Diagnosis not present

## 2024-04-25 DIAGNOSIS — M25561 Pain in right knee: Secondary | ICD-10-CM | POA: Diagnosis not present

## 2024-04-25 DIAGNOSIS — I2510 Atherosclerotic heart disease of native coronary artery without angina pectoris: Secondary | ICD-10-CM | POA: Diagnosis not present

## 2024-04-25 DIAGNOSIS — N3281 Overactive bladder: Secondary | ICD-10-CM | POA: Diagnosis not present

## 2024-06-11 DIAGNOSIS — L851 Acquired keratosis [keratoderma] palmaris et plantaris: Secondary | ICD-10-CM | POA: Diagnosis not present

## 2024-06-11 DIAGNOSIS — M79674 Pain in right toe(s): Secondary | ICD-10-CM | POA: Diagnosis not present

## 2024-06-11 DIAGNOSIS — M79675 Pain in left toe(s): Secondary | ICD-10-CM | POA: Diagnosis not present

## 2024-06-11 DIAGNOSIS — I739 Peripheral vascular disease, unspecified: Secondary | ICD-10-CM | POA: Diagnosis not present

## 2024-06-11 DIAGNOSIS — B351 Tinea unguium: Secondary | ICD-10-CM | POA: Diagnosis not present

## 2024-06-26 DIAGNOSIS — I10 Essential (primary) hypertension: Secondary | ICD-10-CM | POA: Diagnosis not present

## 2024-06-26 DIAGNOSIS — M7918 Myalgia, other site: Secondary | ICD-10-CM | POA: Diagnosis not present
